# Patient Record
Sex: Female | Born: 1950 | Race: White | Hispanic: No | Smoking: Former smoker
Health system: Southern US, Community
[De-identification: ages and names within clinical notes are randomized; demographics above are authoritative.]

## PROBLEM LIST (undated history)

## (undated) DIAGNOSIS — R7881 Bacteremia: Secondary | ICD-10-CM

## (undated) DIAGNOSIS — E039 Hypothyroidism, unspecified: Secondary | ICD-10-CM

## (undated) DIAGNOSIS — I219 Acute myocardial infarction, unspecified: Secondary | ICD-10-CM

## (undated) DIAGNOSIS — E785 Hyperlipidemia, unspecified: Secondary | ICD-10-CM

## (undated) DIAGNOSIS — F329 Major depressive disorder, single episode, unspecified: Secondary | ICD-10-CM

## (undated) DIAGNOSIS — F32A Depression, unspecified: Secondary | ICD-10-CM

## (undated) DIAGNOSIS — K5790 Diverticulosis of intestine, part unspecified, without perforation or abscess without bleeding: Secondary | ICD-10-CM

## (undated) DIAGNOSIS — E119 Type 2 diabetes mellitus without complications: Secondary | ICD-10-CM

## (undated) DIAGNOSIS — C642 Malignant neoplasm of left kidney, except renal pelvis: Secondary | ICD-10-CM

## (undated) DIAGNOSIS — I10 Essential (primary) hypertension: Secondary | ICD-10-CM

## (undated) DIAGNOSIS — C689 Malignant neoplasm of urinary organ, unspecified: Secondary | ICD-10-CM

## (undated) DIAGNOSIS — I739 Peripheral vascular disease, unspecified: Secondary | ICD-10-CM

## (undated) DIAGNOSIS — F419 Anxiety disorder, unspecified: Secondary | ICD-10-CM

## (undated) DIAGNOSIS — I779 Disorder of arteries and arterioles, unspecified: Secondary | ICD-10-CM

## (undated) DIAGNOSIS — I25810 Atherosclerosis of coronary artery bypass graft(s) without angina pectoris: Secondary | ICD-10-CM

## (undated) DIAGNOSIS — M1990 Unspecified osteoarthritis, unspecified site: Secondary | ICD-10-CM

## (undated) DIAGNOSIS — E669 Obesity, unspecified: Secondary | ICD-10-CM

## (undated) DIAGNOSIS — D369 Benign neoplasm, unspecified site: Secondary | ICD-10-CM

## (undated) DIAGNOSIS — K219 Gastro-esophageal reflux disease without esophagitis: Secondary | ICD-10-CM

## (undated) HISTORY — DX: Essential (primary) hypertension: I10

## (undated) HISTORY — DX: Diverticulosis of intestine, part unspecified, without perforation or abscess without bleeding: K57.90

## (undated) HISTORY — DX: Depression, unspecified: F32.A

## (undated) HISTORY — DX: Type 2 diabetes mellitus without complications: E11.9

## (undated) HISTORY — PX: RENAL MASS EXCISION: SHX2324

## (undated) HISTORY — DX: Peripheral vascular disease, unspecified: I73.9

## (undated) HISTORY — DX: Bacteremia: R78.81

## (undated) HISTORY — PX: CHOLECYSTECTOMY: SHX55

## (undated) HISTORY — DX: Hyperlipidemia, unspecified: E78.5

## (undated) HISTORY — DX: Anxiety disorder, unspecified: F41.9

## (undated) HISTORY — DX: Malignant neoplasm of urinary organ, unspecified: C68.9

## (undated) HISTORY — DX: Disorder of arteries and arterioles, unspecified: I77.9

## (undated) HISTORY — DX: Benign neoplasm, unspecified site: D36.9

## (undated) HISTORY — DX: Hypothyroidism, unspecified: E03.9

## (undated) HISTORY — DX: Gastro-esophageal reflux disease without esophagitis: K21.9

## (undated) HISTORY — DX: Malignant neoplasm of left kidney, except renal pelvis: C64.2

## (undated) HISTORY — DX: Obesity, unspecified: E66.9

## (undated) HISTORY — DX: Unspecified osteoarthritis, unspecified site: M19.90

## (undated) HISTORY — DX: Major depressive disorder, single episode, unspecified: F32.9

## (undated) HISTORY — PX: APPENDECTOMY: SHX54

---

## 1975-06-26 HISTORY — PX: BLADDER REPAIR: SHX76

## 1975-06-26 HISTORY — PX: ABDOMINAL HYSTERECTOMY: SHX81

## 1997-06-25 DIAGNOSIS — D369 Benign neoplasm, unspecified site: Secondary | ICD-10-CM

## 1997-06-25 HISTORY — DX: Benign neoplasm, unspecified site: D36.9

## 1997-06-25 HISTORY — PX: CERVICAL SPINE SURGERY: SHX589

## 1997-12-14 ENCOUNTER — Ambulatory Visit (HOSPITAL_COMMUNITY): Admission: RE | Admit: 1997-12-14 | Discharge: 1997-12-14 | Payer: Self-pay | Admitting: Internal Medicine

## 1998-02-25 ENCOUNTER — Ambulatory Visit (HOSPITAL_COMMUNITY): Admission: RE | Admit: 1998-02-25 | Discharge: 1998-02-25 | Payer: Self-pay | Admitting: Gastroenterology

## 1998-12-24 HISTORY — PX: CARDIOVASCULAR STRESS TEST: SHX262

## 1999-06-26 HISTORY — PX: KNEE ARTHROSCOPY: SUR90

## 2000-06-25 HISTORY — PX: CT ABD W & PELVIS WO CM: HXRAD294

## 2000-07-26 LAB — HM DIABETES EYE EXAM

## 2001-04-17 ENCOUNTER — Encounter: Payer: Self-pay | Admitting: Internal Medicine

## 2001-05-02 ENCOUNTER — Ambulatory Visit (HOSPITAL_COMMUNITY): Admission: RE | Admit: 2001-05-02 | Discharge: 2001-05-02 | Payer: Self-pay | Admitting: Gastroenterology

## 2001-05-02 ENCOUNTER — Encounter (INDEPENDENT_AMBULATORY_CARE_PROVIDER_SITE_OTHER): Payer: Self-pay | Admitting: Specialist

## 2001-08-18 ENCOUNTER — Encounter: Payer: Self-pay | Admitting: Internal Medicine

## 2001-08-23 HISTORY — PX: HEEL SPUR SURGERY: SHX665

## 2002-08-24 HISTORY — PX: TRANSTHORACIC ECHOCARDIOGRAM: SHX275

## 2004-09-21 ENCOUNTER — Ambulatory Visit: Payer: Self-pay | Admitting: Internal Medicine

## 2004-10-17 ENCOUNTER — Ambulatory Visit: Payer: Self-pay | Admitting: Internal Medicine

## 2005-07-30 ENCOUNTER — Ambulatory Visit: Payer: Self-pay | Admitting: Internal Medicine

## 2005-09-05 ENCOUNTER — Ambulatory Visit: Payer: Self-pay | Admitting: Internal Medicine

## 2005-10-04 ENCOUNTER — Ambulatory Visit: Payer: Self-pay | Admitting: Internal Medicine

## 2006-01-04 ENCOUNTER — Ambulatory Visit: Payer: Self-pay | Admitting: Internal Medicine

## 2006-05-24 ENCOUNTER — Ambulatory Visit: Payer: Self-pay | Admitting: Internal Medicine

## 2006-07-05 ENCOUNTER — Ambulatory Visit: Payer: Self-pay | Admitting: Internal Medicine

## 2006-07-05 LAB — CONVERTED CEMR LAB: Hgb A1c MFr Bld: 8.4 % — ABNORMAL HIGH (ref 4.6–6.1)

## 2006-08-22 ENCOUNTER — Ambulatory Visit: Payer: Self-pay | Admitting: Internal Medicine

## 2006-10-25 ENCOUNTER — Telehealth (INDEPENDENT_AMBULATORY_CARE_PROVIDER_SITE_OTHER): Payer: Self-pay | Admitting: *Deleted

## 2006-10-31 ENCOUNTER — Encounter: Payer: Self-pay | Admitting: Internal Medicine

## 2006-10-31 DIAGNOSIS — M159 Polyosteoarthritis, unspecified: Secondary | ICD-10-CM | POA: Insufficient documentation

## 2006-10-31 DIAGNOSIS — E039 Hypothyroidism, unspecified: Secondary | ICD-10-CM

## 2006-10-31 DIAGNOSIS — E669 Obesity, unspecified: Secondary | ICD-10-CM

## 2006-10-31 DIAGNOSIS — K219 Gastro-esophageal reflux disease without esophagitis: Secondary | ICD-10-CM

## 2006-10-31 DIAGNOSIS — F39 Unspecified mood [affective] disorder: Secondary | ICD-10-CM

## 2006-10-31 DIAGNOSIS — I10 Essential (primary) hypertension: Secondary | ICD-10-CM

## 2006-11-01 DIAGNOSIS — E119 Type 2 diabetes mellitus without complications: Secondary | ICD-10-CM | POA: Insufficient documentation

## 2006-11-01 DIAGNOSIS — F411 Generalized anxiety disorder: Secondary | ICD-10-CM

## 2006-11-08 ENCOUNTER — Ambulatory Visit: Payer: Self-pay | Admitting: Internal Medicine

## 2006-11-11 LAB — CONVERTED CEMR LAB
BUN: 16 mg/dL (ref 6–23)
Chloride: 105 meq/L (ref 96–112)
Cholesterol: 136 mg/dL (ref 0–200)
Free T4: 1.36 ng/dL (ref 0.89–1.80)
Glucose, Bld: 61 mg/dL — ABNORMAL LOW (ref 70–99)
Hgb A1c MFr Bld: 8.1 % — ABNORMAL HIGH (ref 4.6–6.1)
Microalb Creat Ratio: 7 mg/g (ref 0.0–30.0)
Microalb, Ur: 1.2 mg/dL (ref 0.00–1.89)
Phosphorus: 5.3 mg/dL — ABNORMAL HIGH (ref 2.3–4.6)
Potassium: 4.2 meq/L (ref 3.5–5.3)
TSH: 1.35 microintl units/mL (ref 0.350–5.50)

## 2007-02-06 ENCOUNTER — Telehealth (INDEPENDENT_AMBULATORY_CARE_PROVIDER_SITE_OTHER): Payer: Self-pay | Admitting: *Deleted

## 2007-03-07 ENCOUNTER — Telehealth (INDEPENDENT_AMBULATORY_CARE_PROVIDER_SITE_OTHER): Payer: Self-pay | Admitting: *Deleted

## 2007-04-14 ENCOUNTER — Telehealth: Payer: Self-pay | Admitting: Internal Medicine

## 2007-07-04 ENCOUNTER — Encounter (INDEPENDENT_AMBULATORY_CARE_PROVIDER_SITE_OTHER): Payer: Self-pay | Admitting: *Deleted

## 2007-07-11 ENCOUNTER — Ambulatory Visit: Payer: Self-pay | Admitting: Internal Medicine

## 2007-07-17 ENCOUNTER — Telehealth (INDEPENDENT_AMBULATORY_CARE_PROVIDER_SITE_OTHER): Payer: Self-pay | Admitting: *Deleted

## 2007-07-31 ENCOUNTER — Ambulatory Visit: Payer: Self-pay | Admitting: Internal Medicine

## 2007-08-01 ENCOUNTER — Encounter: Payer: Self-pay | Admitting: Internal Medicine

## 2007-08-08 ENCOUNTER — Ambulatory Visit: Payer: Self-pay | Admitting: Internal Medicine

## 2007-08-21 ENCOUNTER — Telehealth: Payer: Self-pay | Admitting: Internal Medicine

## 2007-08-22 ENCOUNTER — Encounter: Payer: Self-pay | Admitting: Internal Medicine

## 2007-08-28 LAB — HM MAMMOGRAPHY: HM Mammogram: NORMAL

## 2007-09-02 ENCOUNTER — Encounter (INDEPENDENT_AMBULATORY_CARE_PROVIDER_SITE_OTHER): Payer: Self-pay | Admitting: *Deleted

## 2007-09-15 ENCOUNTER — Telehealth (INDEPENDENT_AMBULATORY_CARE_PROVIDER_SITE_OTHER): Payer: Self-pay | Admitting: *Deleted

## 2007-09-16 ENCOUNTER — Telehealth (INDEPENDENT_AMBULATORY_CARE_PROVIDER_SITE_OTHER): Payer: Self-pay | Admitting: *Deleted

## 2007-10-16 ENCOUNTER — Telehealth (INDEPENDENT_AMBULATORY_CARE_PROVIDER_SITE_OTHER): Payer: Self-pay | Admitting: *Deleted

## 2007-10-24 ENCOUNTER — Telehealth (INDEPENDENT_AMBULATORY_CARE_PROVIDER_SITE_OTHER): Payer: Self-pay | Admitting: *Deleted

## 2007-11-06 ENCOUNTER — Telehealth: Payer: Self-pay | Admitting: Internal Medicine

## 2007-11-06 ENCOUNTER — Encounter (INDEPENDENT_AMBULATORY_CARE_PROVIDER_SITE_OTHER): Payer: Self-pay | Admitting: *Deleted

## 2007-11-06 ENCOUNTER — Ambulatory Visit: Payer: Self-pay | Admitting: Internal Medicine

## 2007-11-10 ENCOUNTER — Encounter: Payer: Self-pay | Admitting: Internal Medicine

## 2007-12-04 ENCOUNTER — Ambulatory Visit: Payer: Self-pay | Admitting: Cardiology

## 2007-12-12 ENCOUNTER — Ambulatory Visit: Payer: Self-pay | Admitting: Internal Medicine

## 2007-12-15 LAB — CONVERTED CEMR LAB: Hgb A1c MFr Bld: 8.5 % — ABNORMAL HIGH (ref 4.6–6.1)

## 2007-12-23 ENCOUNTER — Ambulatory Visit: Payer: Self-pay

## 2007-12-23 ENCOUNTER — Encounter: Payer: Self-pay | Admitting: Internal Medicine

## 2007-12-23 ENCOUNTER — Encounter: Payer: Self-pay | Admitting: Cardiology

## 2007-12-25 ENCOUNTER — Ambulatory Visit: Payer: Self-pay

## 2007-12-30 ENCOUNTER — Ambulatory Visit: Payer: Self-pay | Admitting: Cardiology

## 2008-01-02 ENCOUNTER — Telehealth (INDEPENDENT_AMBULATORY_CARE_PROVIDER_SITE_OTHER): Payer: Self-pay | Admitting: *Deleted

## 2008-01-06 ENCOUNTER — Encounter: Payer: Self-pay | Admitting: Internal Medicine

## 2008-01-07 ENCOUNTER — Encounter (INDEPENDENT_AMBULATORY_CARE_PROVIDER_SITE_OTHER): Payer: Self-pay | Admitting: *Deleted

## 2008-03-15 ENCOUNTER — Ambulatory Visit: Payer: Self-pay | Admitting: Internal Medicine

## 2008-03-17 ENCOUNTER — Encounter (INDEPENDENT_AMBULATORY_CARE_PROVIDER_SITE_OTHER): Payer: Self-pay | Admitting: *Deleted

## 2008-03-17 ENCOUNTER — Telehealth (INDEPENDENT_AMBULATORY_CARE_PROVIDER_SITE_OTHER): Payer: Self-pay | Admitting: *Deleted

## 2008-03-17 LAB — CONVERTED CEMR LAB
AST: 44 units/L — ABNORMAL HIGH (ref 0–37)
Albumin: 4.3 g/dL (ref 3.5–5.2)
Alkaline Phosphatase: 85 units/L (ref 39–117)
BUN: 13 mg/dL (ref 6–23)
HDL: 35 mg/dL — ABNORMAL LOW (ref >39)
Hgb A1c MFr Bld: 8.2 % — ABNORMAL HIGH (ref 4.6–6.1)
LDL Cholesterol: 60 mg/dL (ref 0–99)
Potassium: 4.2 meq/L (ref 3.5–5.3)
Sodium: 140 meq/L (ref 135–145)
Total Protein: 7.4 g/dL (ref 6.0–8.3)

## 2008-03-18 ENCOUNTER — Emergency Department: Payer: Self-pay | Admitting: Emergency Medicine

## 2008-03-19 ENCOUNTER — Ambulatory Visit: Payer: Self-pay | Admitting: Internal Medicine

## 2008-03-19 ENCOUNTER — Telehealth (INDEPENDENT_AMBULATORY_CARE_PROVIDER_SITE_OTHER): Payer: Self-pay | Admitting: *Deleted

## 2008-03-22 ENCOUNTER — Telehealth: Payer: Self-pay | Admitting: Internal Medicine

## 2008-05-06 ENCOUNTER — Telehealth: Payer: Self-pay | Admitting: Internal Medicine

## 2008-05-28 ENCOUNTER — Ambulatory Visit: Payer: Self-pay | Admitting: Internal Medicine

## 2008-07-07 ENCOUNTER — Ambulatory Visit: Payer: Self-pay | Admitting: Family Medicine

## 2008-07-09 ENCOUNTER — Encounter (INDEPENDENT_AMBULATORY_CARE_PROVIDER_SITE_OTHER): Payer: Self-pay | Admitting: *Deleted

## 2008-07-09 ENCOUNTER — Ambulatory Visit: Payer: Self-pay | Admitting: Family Medicine

## 2008-07-13 ENCOUNTER — Ambulatory Visit: Payer: Self-pay | Admitting: Family Medicine

## 2008-07-14 ENCOUNTER — Ambulatory Visit: Payer: Self-pay | Admitting: Family Medicine

## 2008-07-15 ENCOUNTER — Ambulatory Visit: Payer: Self-pay | Admitting: Family Medicine

## 2008-07-16 ENCOUNTER — Encounter (INDEPENDENT_AMBULATORY_CARE_PROVIDER_SITE_OTHER): Payer: Self-pay | Admitting: *Deleted

## 2008-07-16 LAB — CONVERTED CEMR LAB
CO2: 29 meq/L (ref 19–32)
Calcium: 8.9 mg/dL (ref 8.4–10.5)
Chloride: 104 meq/L (ref 96–112)
Glucose, Bld: 177 mg/dL — ABNORMAL HIGH (ref 70–99)
Hemoglobin: 13.1 g/dL (ref 12.0–15.0)
Lymphocytes Relative: 26.3 % (ref 12.0–46.0)
Monocytes Relative: 4.2 % (ref 3.0–12.0)
Neutro Abs: 4.6 10*3/uL (ref 1.4–7.7)
Potassium: 5.2 meq/L — ABNORMAL HIGH (ref 3.5–5.1)
RDW: 14.5 % (ref 11.5–14.6)
Sodium: 141 meq/L (ref 135–145)

## 2008-08-30 ENCOUNTER — Telehealth: Payer: Self-pay | Admitting: Internal Medicine

## 2008-09-16 ENCOUNTER — Ambulatory Visit: Payer: Self-pay | Admitting: Internal Medicine

## 2008-09-17 ENCOUNTER — Telehealth: Payer: Self-pay | Admitting: Internal Medicine

## 2008-09-17 LAB — CONVERTED CEMR LAB
Calcium: 8.9 mg/dL (ref 8.4–10.5)
Creatinine,U: 236.3 mg/dL
GFR calc non Af Amer: 90.87 mL/min (ref >60)
Hgb A1c MFr Bld: 7.5 % — ABNORMAL HIGH (ref 4.6–6.5)
Microalb Creat Ratio: 80 mg/g — ABNORMAL HIGH (ref 0.0–30.0)
Microalb, Ur: 18.9 mg/dL — ABNORMAL HIGH (ref 0.0–1.9)
Sodium: 146 meq/L — ABNORMAL HIGH (ref 135–145)

## 2008-10-13 ENCOUNTER — Ambulatory Visit: Payer: Self-pay | Admitting: Family Medicine

## 2008-10-18 ENCOUNTER — Encounter: Payer: Self-pay | Admitting: Internal Medicine

## 2008-10-28 ENCOUNTER — Telehealth: Payer: Self-pay | Admitting: Internal Medicine

## 2008-11-08 ENCOUNTER — Ambulatory Visit: Payer: Self-pay | Admitting: Internal Medicine

## 2008-11-26 ENCOUNTER — Telehealth: Payer: Self-pay | Admitting: Internal Medicine

## 2008-12-09 ENCOUNTER — Telehealth: Payer: Self-pay | Admitting: Internal Medicine

## 2008-12-13 ENCOUNTER — Telehealth: Payer: Self-pay | Admitting: Internal Medicine

## 2008-12-13 ENCOUNTER — Ambulatory Visit: Payer: Self-pay | Admitting: Internal Medicine

## 2008-12-14 ENCOUNTER — Telehealth: Payer: Self-pay | Admitting: Internal Medicine

## 2008-12-17 ENCOUNTER — Ambulatory Visit: Payer: Self-pay | Admitting: Internal Medicine

## 2009-01-05 ENCOUNTER — Telehealth: Payer: Self-pay | Admitting: Internal Medicine

## 2009-01-07 ENCOUNTER — Telehealth: Payer: Self-pay | Admitting: Internal Medicine

## 2009-01-07 ENCOUNTER — Ambulatory Visit: Payer: Self-pay | Admitting: Internal Medicine

## 2009-01-10 ENCOUNTER — Telehealth: Payer: Self-pay | Admitting: Internal Medicine

## 2009-01-11 LAB — CONVERTED CEMR LAB
Calcium: 8.7 mg/dL (ref 8.4–10.5)
Chloride: 100 meq/L (ref 96–112)
Phosphorus: 4.3 mg/dL (ref 2.3–4.6)
Potassium: 3.9 meq/L (ref 3.5–5.3)
Sodium: 140 meq/L (ref 135–145)

## 2009-01-12 ENCOUNTER — Ambulatory Visit: Payer: Self-pay | Admitting: Family Medicine

## 2009-01-27 ENCOUNTER — Telehealth: Payer: Self-pay | Admitting: Internal Medicine

## 2009-02-02 ENCOUNTER — Telehealth: Payer: Self-pay | Admitting: Internal Medicine

## 2009-03-11 ENCOUNTER — Ambulatory Visit: Payer: Self-pay | Admitting: Internal Medicine

## 2009-03-15 LAB — CONVERTED CEMR LAB
AST: 42 units/L — ABNORMAL HIGH (ref 0–37)
Albumin: 4.1 g/dL (ref 3.5–5.2)
Alkaline Phosphatase: 109 units/L (ref 39–117)
Basophils Absolute: 0.1 10*3/uL (ref 0.0–0.1)
Eosinophils Absolute: 0.2 10*3/uL (ref 0.0–0.7)
HDL: 41 mg/dL (ref >39)
Hgb A1c MFr Bld: 8 % — ABNORMAL HIGH (ref 4.6–6.1)
Indirect Bilirubin: 0.3 mg/dL (ref 0.0–0.9)
Lymphocytes Relative: 23 % (ref 12–46)
Lymphs Abs: 1.9 10*3/uL (ref 0.7–4.0)
MCV: 87.1 fL (ref 78.0–100.0)
Microalb Creat Ratio: 36.4 mg/g — ABNORMAL HIGH (ref 0.0–30.0)
Microalb, Ur: 4.33 mg/dL — ABNORMAL HIGH (ref 0.00–1.89)
Neutrophils Relative %: 69 % (ref 43–77)
Platelets: 370 10*3/uL (ref 150–400)
RDW: 16.2 % — ABNORMAL HIGH (ref 11.5–15.5)
Total Bilirubin: 0.4 mg/dL (ref 0.3–1.2)
WBC: 8.3 10*3/uL (ref 4.0–10.5)

## 2009-03-18 ENCOUNTER — Telehealth (INDEPENDENT_AMBULATORY_CARE_PROVIDER_SITE_OTHER): Payer: Self-pay | Admitting: *Deleted

## 2009-03-21 ENCOUNTER — Telehealth: Payer: Self-pay | Admitting: Internal Medicine

## 2009-03-22 ENCOUNTER — Ambulatory Visit: Payer: Self-pay

## 2009-03-22 ENCOUNTER — Encounter: Payer: Self-pay | Admitting: Internal Medicine

## 2009-03-25 ENCOUNTER — Encounter: Payer: Self-pay | Admitting: Internal Medicine

## 2009-04-12 ENCOUNTER — Telehealth: Payer: Self-pay | Admitting: Internal Medicine

## 2009-05-12 ENCOUNTER — Telehealth: Payer: Self-pay | Admitting: Internal Medicine

## 2009-06-01 ENCOUNTER — Ambulatory Visit: Payer: Self-pay | Admitting: Family Medicine

## 2009-06-01 LAB — CONVERTED CEMR LAB
Bilirubin Urine: NEGATIVE
Blood in Urine, dipstick: NEGATIVE
Glucose, Urine, Semiquant: NEGATIVE
Ketones, urine, test strip: NEGATIVE
Protein, U semiquant: NEGATIVE
WBC Urine, dipstick: NEGATIVE

## 2009-06-02 ENCOUNTER — Encounter: Payer: Self-pay | Admitting: Family Medicine

## 2009-06-16 ENCOUNTER — Encounter: Payer: Self-pay | Admitting: Internal Medicine

## 2009-07-19 ENCOUNTER — Ambulatory Visit: Payer: Self-pay | Admitting: General Surgery

## 2009-07-22 ENCOUNTER — Encounter: Payer: Self-pay | Admitting: Internal Medicine

## 2009-07-25 ENCOUNTER — Telehealth: Payer: Self-pay | Admitting: Internal Medicine

## 2009-07-25 DIAGNOSIS — I6529 Occlusion and stenosis of unspecified carotid artery: Secondary | ICD-10-CM

## 2009-07-26 HISTORY — PX: CAROTID ENDARTERECTOMY: SUR193

## 2009-07-28 ENCOUNTER — Telehealth (INDEPENDENT_AMBULATORY_CARE_PROVIDER_SITE_OTHER): Payer: Self-pay | Admitting: *Deleted

## 2009-08-01 ENCOUNTER — Ambulatory Visit: Payer: Self-pay | Admitting: Cardiovascular Disease

## 2009-08-01 ENCOUNTER — Encounter (HOSPITAL_COMMUNITY): Admission: RE | Admit: 2009-08-01 | Discharge: 2009-09-28 | Payer: Self-pay | Admitting: Internal Medicine

## 2009-08-01 ENCOUNTER — Ambulatory Visit: Payer: Self-pay

## 2009-08-03 ENCOUNTER — Ambulatory Visit: Payer: Self-pay

## 2009-08-04 ENCOUNTER — Telehealth: Payer: Self-pay | Admitting: Cardiovascular Disease

## 2009-08-05 ENCOUNTER — Encounter: Payer: Self-pay | Admitting: Internal Medicine

## 2009-08-10 ENCOUNTER — Telehealth: Payer: Self-pay | Admitting: Internal Medicine

## 2009-08-12 ENCOUNTER — Ambulatory Visit: Payer: Self-pay | Admitting: General Surgery

## 2009-08-17 ENCOUNTER — Inpatient Hospital Stay: Payer: Self-pay | Admitting: General Surgery

## 2009-09-23 ENCOUNTER — Ambulatory Visit: Payer: Self-pay | Admitting: Internal Medicine

## 2009-09-27 LAB — CONVERTED CEMR LAB
ALT: 73 units/L — ABNORMAL HIGH (ref 0–35)
AST: 76 units/L — ABNORMAL HIGH (ref 0–37)
Albumin: 3.9 g/dL (ref 3.5–5.2)
CO2: 24 meq/L (ref 19–32)
Calcium: 9 mg/dL (ref 8.4–10.5)
Chloride: 102 meq/L (ref 96–112)
Hgb A1c MFr Bld: 8.7 % — ABNORMAL HIGH (ref 4.6–6.1)
Potassium: 4.1 meq/L (ref 3.5–5.3)
Sodium: 139 meq/L (ref 135–145)
Total Protein: 7 g/dL (ref 6.0–8.3)

## 2009-09-30 ENCOUNTER — Encounter (INDEPENDENT_AMBULATORY_CARE_PROVIDER_SITE_OTHER): Payer: Self-pay | Admitting: *Deleted

## 2009-10-05 ENCOUNTER — Telehealth: Payer: Self-pay | Admitting: Internal Medicine

## 2009-11-22 ENCOUNTER — Encounter: Payer: Self-pay | Admitting: Internal Medicine

## 2009-12-02 ENCOUNTER — Ambulatory Visit: Payer: Self-pay | Admitting: Internal Medicine

## 2010-01-05 ENCOUNTER — Telehealth (INDEPENDENT_AMBULATORY_CARE_PROVIDER_SITE_OTHER): Payer: Self-pay | Admitting: *Deleted

## 2010-01-06 ENCOUNTER — Ambulatory Visit: Payer: Self-pay | Admitting: Family Medicine

## 2010-01-06 DIAGNOSIS — T148 Other injury of unspecified body region: Secondary | ICD-10-CM

## 2010-01-06 DIAGNOSIS — W57XXXA Bitten or stung by nonvenomous insect and other nonvenomous arthropods, initial encounter: Secondary | ICD-10-CM | POA: Insufficient documentation

## 2010-01-23 ENCOUNTER — Telehealth: Payer: Self-pay | Admitting: Family Medicine

## 2010-03-03 ENCOUNTER — Ambulatory Visit: Payer: Self-pay | Admitting: Internal Medicine

## 2010-03-05 LAB — CONVERTED CEMR LAB
ALT: 22 units/L (ref 0–35)
AST: 17 units/L (ref 0–37)
BUN: 22 mg/dL (ref 6–23)
Cholesterol: 145 mg/dL (ref 0–200)
Creatinine, Ser: 0.78 mg/dL (ref 0.40–1.20)
HDL: 42 mg/dL (ref >39)
Hgb A1c MFr Bld: 6 % — ABNORMAL HIGH (ref <5.7)
Total Bilirubin: 0.3 mg/dL (ref 0.3–1.2)
Total CHOL/HDL Ratio: 3.5
VLDL: 19 mg/dL (ref 0–40)

## 2010-04-05 ENCOUNTER — Telehealth: Payer: Self-pay | Admitting: Internal Medicine

## 2010-04-14 ENCOUNTER — Telehealth (INDEPENDENT_AMBULATORY_CARE_PROVIDER_SITE_OTHER): Payer: Self-pay | Admitting: *Deleted

## 2010-04-18 ENCOUNTER — Ambulatory Visit: Payer: Self-pay | Admitting: Internal Medicine

## 2010-04-24 ENCOUNTER — Telehealth: Payer: Self-pay | Admitting: Internal Medicine

## 2010-04-26 ENCOUNTER — Telehealth: Payer: Self-pay | Admitting: Internal Medicine

## 2010-05-05 ENCOUNTER — Telehealth: Payer: Self-pay | Admitting: Internal Medicine

## 2010-05-05 ENCOUNTER — Encounter: Payer: Self-pay | Admitting: Internal Medicine

## 2010-05-09 ENCOUNTER — Telehealth: Payer: Self-pay | Admitting: Internal Medicine

## 2010-05-12 ENCOUNTER — Encounter: Payer: Self-pay | Admitting: Internal Medicine

## 2010-05-26 ENCOUNTER — Ambulatory Visit: Payer: Self-pay | Admitting: Internal Medicine

## 2010-05-29 ENCOUNTER — Telehealth: Payer: Self-pay | Admitting: Internal Medicine

## 2010-05-29 LAB — CONVERTED CEMR LAB
AST: 18 units/L (ref 0–37)
Albumin: 4.4 g/dL (ref 3.5–5.2)
Basophils Absolute: 0.1 10*3/uL (ref 0.0–0.1)
Bilirubin, Direct: <0.1 mg/dL (ref 0.0–0.3)
Eosinophils Absolute: 0.3 10*3/uL (ref 0.0–0.7)
Eosinophils Relative: 3 % (ref 0–5)
HCT: 42.5 % (ref 36.0–46.0)
Lymphs Abs: 3 10*3/uL (ref 0.7–4.0)
MCV: 84.5 fL (ref 78.0–100.0)
Neutrophils Relative %: 52 % (ref 43–77)
Platelets: 292 10*3/uL (ref 150–400)
RDW: 16.5 % — ABNORMAL HIGH (ref 11.5–15.5)
Total Bilirubin: 0.2 mg/dL — ABNORMAL LOW (ref 0.3–1.2)
Valproic Acid Lvl: 47.1 ug/mL — ABNORMAL LOW (ref 50.0–100.0)
WBC: 8.6 10*3/uL (ref 4.0–10.5)

## 2010-06-09 ENCOUNTER — Telehealth: Payer: Self-pay | Admitting: Internal Medicine

## 2010-07-11 ENCOUNTER — Telehealth: Payer: Self-pay | Admitting: Internal Medicine

## 2010-07-23 LAB — CONVERTED CEMR LAB
ALT: 80 U/L — ABNORMAL HIGH
AST: 97 U/L — ABNORMAL HIGH
Albumin: 3.5 g/dL
Alkaline Phosphatase: 86 U/L
BUN: 15 mg/dL
Basophils Absolute: 0 K/uL
Basophils Relative: 0.1 %
Bilirubin, Direct: 0.1 mg/dL
CO2: 34 meq/L — ABNORMAL HIGH
Calcium: 8.9 mg/dL
Chloride: 101 meq/L
Cholesterol: 172 mg/dL
Creatinine, Ser: 0.7 mg/dL
Creatinine,U: 56.1 mg/dL
Eosinophils Absolute: 0.3 K/uL
Eosinophils Relative: 2.3 %
GFR calc Af Amer: 111 mL/min
GFR calc non Af Amer: 91 mL/min
Glucose, Bld: 196 mg/dL — ABNORMAL HIGH
HCT: 42.4 %
HDL: 49.9 mg/dL
Hemoglobin: 13.9 g/dL
Hgb A1c MFr Bld: 9.3 % — ABNORMAL HIGH
LDL Cholesterol: 95 mg/dL
Lymphocytes Relative: 25.7 %
MCHC: 32.7 g/dL
MCV: 84.9 fL
Microalb Creat Ratio: 37.4 mg/g — ABNORMAL HIGH
Microalb, Ur: 2.1 mg/dL — ABNORMAL HIGH
Monocytes Absolute: 0.5 K/uL
Monocytes Relative: 4.8 %
Neutro Abs: 7.4 K/uL
Neutrophils Relative %: 67.1 %
Phosphorus: 5.1 mg/dL — ABNORMAL HIGH
Platelets: 340 K/uL
Potassium: 4.2 meq/L
RBC: 5 M/uL
RDW: 15.4 % — ABNORMAL HIGH
Sodium: 142 meq/L
TSH: 2.83 u[IU]/mL
Total Bilirubin: 0.7 mg/dL
Total CHOL/HDL Ratio: 3.4
Total Protein: 7 g/dL
Triglycerides: 138 mg/dL
VLDL: 28 mg/dL
WBC: 11.1 10*3/microliter — ABNORMAL HIGH

## 2010-07-24 ENCOUNTER — Telehealth: Payer: Self-pay | Admitting: Internal Medicine

## 2010-07-25 NOTE — Progress Notes (Signed)
Summary: Nuclear Pre-Procedure  Phone Note Outgoing Call Call back at [facility] Phone [phone]   Call placed by: Stanton Kidney, EMT-P,  July 28, 2009 2:03 PM Action Taken: Phone Call Completed Summary of Call: Left message with information on My[employer] Information Sheet (see scanned document for details).     Nuclear Med Background Indications for Stress Test: Evaluation for Ischemia, Surgical Clearance  Indications Comments: Pending (R) CEA by Dr. Virgia Land ([place])  History: Echo, Heart Catheterization, Myocardial Perfusion Study      Nuclear Pre-Procedure Cardiac Risk Factors: Carotid Disease, Hypertension, IDDM Type 2, Lipids Height (in): 61

## 2010-07-25 NOTE — Letter (Signed)
Summary: [facility]  [facility]   Imported By: Lanelle Bal 12/01/2009 09:24:31  _____________________________________________________________________  External Attachment:    Type:   Image     Comment:   External Document  Appended Document:  [facility] F/U of right CEA dong well 1 year follow up

## 2010-07-25 NOTE — Progress Notes (Signed)
Summary: citalopram   Phone Note Refill Request Message from:  Fax from Pharmacy on May 09, 2010 2:42 PM  Refills Requested: Medication #1:  CITALOPRAM HYDROBROMIDE 40 MG TABS take 1 tablet by mouth once daily   Last Refilled: 10/31/2009 Refill request recieved from [facility] [address] [phone]. Form is on your desk.    Follow-up for Phone Call        dose has been decreased to 40mg  daily Follow-up by: [name] MD,  May 10, 2010 7:55 AM  Additional Follow-up for Phone Call Additional follow up Details #1::        Rx faxed to pharmacy for lower dose. Additional Follow-up by: Mervin Hack CMA Duncan Dull),  May 10, 2010 8:19 AM    Prescriptions: CITALOPRAM HYDROBROMIDE 40 MG TABS (CITALOPRAM HYDROBROMIDE) take 1 tablet by mouth once daily  #90 x 3   Entered by:   Mervin Hack CMA (AAMA)   Authorized by:   [name] MD   Signed by:   Mervin Hack CMA (AAMA) on 05/10/2010   Method used:   Electronically to        [facility] [address] (retail)       [address]       [place], [place]  [address]       Ph: [phone]       Fax: [phone]   RxID:   [id number]

## 2010-07-25 NOTE — Medication Information (Signed)
Summary: Insulin change/[facility]  Insulin change/[facility]   Imported By: Lester  05/15/2010 12:30:59  _____________________________________________________________________  External Attachment:    Type:   Image     Comment:   External Document

## 2010-07-25 NOTE — Progress Notes (Signed)
Summary: flea bites  Phone Note Call from Patient Call back at Work Phone [phone]   Caller: Patient Call For: [name] MD Summary of Call: Patient's grand daughter brought over her new kitten on tuesday and Nautia says that she was eaten up by the fleas that were on the kitten. She says that the flea bites are on her face and on her body and they itch terribly. She has made several of them bleed while scratching them. She is very concerned about them becoming infected because she is a diabetic. She wants to know if she could have some cream or something called in to [facility] [address] to help clear this up and stop the itching.  Initial call taken by: Melody Comas,  January 05, 2010 2:54 PM  Follow-up for Phone Call        I really hesitate to call in steroid cream without seeing it.  Can she come in to see someone? Follow-up by: Ruthe Mannan MD,  January 05, 2010 3:02 PM  Additional Follow-up for Phone Call Additional follow up Details #1::        Appt scheduled for tomorrow.  Additional Follow-up by: Melody Comas,  January 05, 2010 4:17 PM

## 2010-07-25 NOTE — Letter (Signed)
Summary: Medical Clearance for Surgery  Medical Clearance for Surgery   Imported By: Maryln Gottron 09/12/2009 15:09:56  _____________________________________________________________________  External Attachment:    Type:   Image     Comment:   External Document

## 2010-07-25 NOTE — Assessment & Plan Note (Signed)
Summary: 30 MIN F/U PER DR LETVAK/CLE   Vital Signs:  Patient profile:   60 year old female Weight:      256 pounds Temp:     98.1 degrees F oral BP sitting:   124 / 60  (left arm) Cuff size:   large  Vitals Entered By: Mervin Hack CMA Duncan Dull) (April 18, 2010 10:01 AM) CC: follow-up visit   History of Present Illness: Still on Atkins diet has lost an additional 9# sugars doing well even off the novolog  Mood has been a problem "hateful" at times--not at work as much but at times with family "little things irritate me"  Did try lorazepam once--may have kept her from being "hateful" Is regular with bupropion  May be somewhat worse on the lower dose of the citalopram  Not sleeping well Initiates well but then up after a couple of hours. Awakens multiple times this has been going on for a long time is tired in AM and stays tired brand Remus Loffler works but not generic. Temazepam no hellp trazodone not that effective---and AM sedation if she takes 100mg   Does get depressed at times will get tearful at times (like after hearing a song on the radio that makes her think of her husband who died 10 years ago) Has been shopping a lot on [employer]--though not spending a lot of money Does have some up and down periods Daughter is bipolar No true mania  Allergies: 1)  ! Lisinopril-Hydrochlorothiazide (Lisinopril-Hydrochlorothiazide) 2)  Demerol (Meperidine Hcl) 3)  Codeine Phosphate (Codeine Phosphate) 4)  * Bextra (Valdecoxib) 5)  Effexor 6)  Cymbalta (Duloxetine Hcl)  Past History:  Past medical, surgical, family and social histories (including risk factors) reviewed for relevance to current acute and chronic problems.  Past Medical History: Reviewed history from 10/31/2006 and no changes required. Colonic polyps, hx of Depression--paranoid tendencies Diabetes mellitus, type II GERD Hyperlipidemia Hypertension Hypothyroidism Anxiety Obesity Arthritis--L knee  Past  Surgical History: Reviewed history from 09/23/2009 and no changes required. Heel spur left 03/03 Left knee arthroscoopy Katrinka Blazing) 2001 Hysterectomy 1977 ( one ovary left) bladder tacking 1977 Cervical spine repairs 1999 DEXA Normal 05/02 Abd. CT negative, Pitutary MRI negative 2002 ECHO normal03/04 Stress test negative (no cardiolite) 07/00 Right CEA  2/11    Dr Evette Cristal  Family History: Father: Died at age 40, CAD, DM Mother: Alive, breast cancer, DM, CAD Siblings 2 One R.A., one possible throat cancer, HTN  CAD- parents HTN- sister Older aughter is bipolar, younger daughter also with depression  Social History: Reviewed history from 03/15/2008 and no changes required. 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  NAD Psych:  normally interactive, good eye contact, and not depressed appearing.  Does have a slight pressured speech but not all that much   Impression & Recommendations:  Problem # 1:  DEPRESSION (ICD-311) Assessment Deteriorated had been worsening even before we had to decrease the dose of the citalopram No true mania but has irritability and fidgetiness suggestive of bipolar variant sleep has been resistant to multiple meds including temazepam, trazodone, melatonin Brand ambien worked but not generic Daytime irritability argues for generalized Rx increase--not just for sleep  P: will try valproate  for mood stabilization     stop the trazodone since not helping     lorazepam helps but want to avoid dependence     counselled more than half of 25 minute visit  The following medications were removed from the medication list:    Trazodone Hcl 100 Mg Tabs (Trazodone hcl) .Marland Kitchen... Take 1/2 tab  the first 2 nights, then 1 full tab if not tired from med in the am Her updated medication list for this problem includes:    Lorazepam 0.5 Mg Tabs (Lorazepam) .Marland Kitchen... Take 1-2  tablets  by mouth two times a day as needed nerves    Citalopram Hydrobromide 40 Mg Tabs (Citalopram hydrobromide) .Marland Kitchen... Take 1 tablet by mouth once daily    Bupropion Hcl 150 Mg Xr12h-tab (Bupropion hcl) .Marland Kitchen... Take 1 by mouth two times a day  Complete Medication List: 1)  Simvastatin 80 Mg Tabs (Simvastatin) .... Take 1/2 tab  by mouth daily 2)  Lantus 100 Unit/ml Soln (Insulin glargine) .Marland Kitchen.. 100 units daily as directed 3)  Metformin Hcl 500 Mg Tabs (Metformin hcl) .... Take one by mouth bid 4)  Levoxyl 100 Mcg Tabs (Levothyroxine sodium) .... Take one by mouth daily 5)  Vicodin 5-500 Mg Tabs (Hydrocodone-acetaminophen) .Marland Kitchen.. 1 every 4 hours as needed for severe pain 6)  Lorazepam 0.5 Mg Tabs (Lorazepam) .... Take 1-2  tablets  by mouth two times a day as needed nerves 7)  Glipizide 5 Mg Tabs (Glipizide) .... Take 1 by mouth two times a day 8)  Citalopram Hydrobromide 40 Mg Tabs (Citalopram hydrobromide) .... Take 1 tablet by mouth once daily 9)  Lisinopril-hydrochlorothiazide 20-25 Mg Tabs (Lisinopril-hydrochlorothiazide) .Marland Kitchen.. 1 daily for high blood pressure 10)  Bupropion Hcl 150 Mg Xr12h-tab (Bupropion hcl) .... Take 1 by mouth two times a day 11)  Estrace 0.1 Mg/gm Crea (Estradiol) .... Apply 1/4 tube three times a week 12)  Relion Blood Glucose Test Strp (Glucose blood) .... Patient test blood sugar three times a day 13)  Fish Oil Caps (Omega-3 fatty acids caps) .... 2 at night 14)  Aleve 220 Mg Caps (Naproxen sodium) .... 2 tablets in am by mouth 15)  Unifine Pentips 31g X 8 Mm Misc (Insulin pen needle) .... Use as directed 16)  Zyrtec Allergy 10 Mg Tabs (Cetirizine hcl) .... Take 1/2 by mouth once daily 17)  Fluocinolone Acetonide 0.025 % Crea (Fluocinolone acetonide) .... Apply to area two times a day 18)   Valproic Acid 250 Mg Caps (Valproic acid) .Marland Kitchen.. 1 tab by mouth two times a day to stabilize mood  Patient Instructions: 1)  Please start the valproic acid two times a day to see if that stabilizes your mood and cuts down on your irritability 2)  Please schedule a follow-up appointment in 1 month.  Prescriptions: VALPROIC ACID 250 MG CAPS (VALPROIC ACID) 1 tab by mouth two times a day to stabilize mood  #60 x 3   Entered and Authorized by:   [name] MD   Signed by:   [name] MD on 04/18/2010   Method used:   Electronically to        [facility] [address] (retail)       [address]       [place], [place]  [address]       Ph: [phone]       Fax: [phone]   RxID:   [id number]  Orders Added: 1)  Est. Patient Level IV [18841]    Current Allergies (reviewed today): ! LISINOPRIL-HYDROCHLOROTHIAZIDE (LISINOPRIL-HYDROCHLOROTHIAZIDE) DEMEROL (MEPERIDINE HCL) CODEINE PHOSPHATE (CODEINE PHOSPHATE) * BEXTRA (VALDECOXIB) EFFEXOR CYMBALTA (DULOXETINE HCL)

## 2010-07-25 NOTE — Assessment & Plan Note (Signed)
Summary: ONE MONTH FOLLOW UP / LFW   Vital Signs:  Patient profile:   60 year old female Height:      61 inches Weight:      252.50 pounds BMI:     47.88 Temp:     97.5 degrees F oral Pulse rate:   64 / minute Pulse rhythm:   regular BP sitting:   110 / 62  (left arm) Cuff size:   large  Vitals Entered By: Linde Gillis CMA Duncan Dull) (May 26, 2010 2:51 PM) CC: one month follow up   History of Present Illness: Sleeping much better  using the ambien most nights now--unless she forgets  Mood is much better Irritability has really calmed down  Depression is better More happy, doing better at work  Allergies: 1)  ! Lisinopril-Hydrochlorothiazide (Lisinopril-Hydrochlorothiazide) 2)  Demerol (Meperidine Hcl) 3)  Codeine Phosphate (Codeine Phosphate) 4)  * Bextra (Valdecoxib) 5)  Effexor 6)  Cymbalta (Duloxetine Hcl)  Past History:  Past medical, surgical, family and social histories (including risk factors) reviewed for relevance to current acute and chronic problems.  Past Medical History: Reviewed history from 10/31/2006 and no changes required. Colonic polyps, hx of Depression--paranoid tendencies Diabetes mellitus, type II GERD Hyperlipidemia Hypertension Hypothyroidism Anxiety Obesity Arthritis--L knee  Past Surgical History: Reviewed history from 09/23/2009 and no changes required. Heel spur left 03/03 Left knee arthroscoopy Katrinka Blazing) 2001 Hysterectomy 1977 ( one ovary left) bladder tacking 1977 Cervical spine repairs 1999 DEXA Normal 05/02 Abd. CT negative, Pitutary MRI negative 2002 ECHO normal03/04 Stress test negative (no cardiolite) 07/00 Right CEA  2/11    Dr Evette Cristal  Family History: Reviewed history from 04/18/2010 and no changes required. Father: Died at age 29, CAD, DM Mother: Alive, breast cancer, DM, CAD Siblings 2 One R.A., one possible throat cancer, HTN  CAD- parents HTN- sister Older aughter is bipolar, younger daughter also with  depression  Social History: Reviewed history from 03/15/2008 and no changes required. 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   Psych:  normally interactive, good eye contact, not anxious appearing, and not depressed appearing.     Impression & Recommendations:  Problem # 1:  DEPRESSION (ICD-311) Assessment Improved  really better now Not as irritable much more energy not clear if that is due to the depakote or sleeping better with ambien  Her updated medication list for this problem includes:    Lorazepam 0.5 Mg Tabs (Lorazepam) .Marland Kitchen... Take 1-2  tablets  by mouth two times a day as needed nerves    Citalopram Hydrobromide 40 Mg Tabs (Citalopram hydrobromide) .Marland Kitchen... Take 1 tablet by mouth once daily    Bupropion Hcl 150 Mg Xr12h-tab (Bupropion hcl) .Marland Kitchen... Take 1 by mouth two times a day  Orders: Venipuncture ([address]) T-CBC w/Diff ([id number]) T-Hepatic Function [phone]) T- * Misc. Laboratory test [phone])  Complete Medication List: 1)  Simvastatin 80 Mg Tabs (Simvastatin) .... Take 1/2 tab  by mouth daily 2)  Metformin Hcl 500 Mg Tabs (Metformin hcl) .... Take one by mouth bid 3)  Levoxyl 100 Mcg Tabs (Levothyroxine sodium) .... Take one by mouth daily 4)  Vicodin 5-500 Mg Tabs (Hydrocodone-acetaminophen) .Marland Kitchen.. 1 every 4 hours as needed for severe pain  5)  Lorazepam 0.5 Mg Tabs (Lorazepam) .... Take 1-2  tablets  by mouth two times a day as needed nerves 6)  Glipizide 5 Mg Tabs (Glipizide) .... Take 1 by mouth two times a day 7)  Citalopram Hydrobromide 40 Mg Tabs (Citalopram hydrobromide) .... Take 1 tablet by mouth once daily 8)  Lisinopril-hydrochlorothiazide 20-25 Mg Tabs  (Lisinopril-hydrochlorothiazide) .Marland Kitchen.. 1 daily for high blood pressure 9)  Bupropion Hcl 150 Mg Xr12h-tab (Bupropion hcl) .... Take 1 by mouth two times a day 10)  Estrace 0.1 Mg/gm Crea (Estradiol) .... Apply 1/4 tube three times a week 11)  Relion Blood Glucose Test Strp (Glucose blood) .... Patient test blood sugar three times a day 12)  Fish Oil Caps (Omega-3 fatty acids caps) .... 2 at night 13)  Aleve 220 Mg Caps (Naproxen sodium) .... 2 tablets in am by mouth 14)  Zyrtec Allergy 10 Mg Tabs (Cetirizine hcl) .... Take 1/2 by mouth once daily 15)  Fluocinolone Acetonide 0.025 % Crea (Fluocinolone acetonide) .... Apply to area two times a day 16)  Zolpidem Tartrate 5 Mg Tabs (Zolpidem tartrate) .Marland Kitchen.. 1-2 at bedtime as needed for sleep 17)  Levemir 100 Unit/ml Soln (Insulin detemir) .Marland Kitchen.. 100 units daily as directed 18)  Depakote 500 Mg Tbec (Divalproex sodium) .... Take 1 by mouth two times a day  Patient Instructions: 1)  Please schedule a follow-up appointment in 4 months .    Orders Added: 1)  Est. Patient Level III [44034] 2)  Venipuncture [36415] 3)  T-CBC w/Diff [[id number]] 4)  T-Hepatic Function [[id number]] 5)  T- * Misc. Laboratory test [phone]    Current Allergies (reviewed today): ! LISINOPRIL-HYDROCHLOROTHIAZIDE (LISINOPRIL-HYDROCHLOROTHIAZIDE) DEMEROL (MEPERIDINE HCL) CODEINE PHOSPHATE (CODEINE PHOSPHATE) * BEXTRA (VALDECOXIB) EFFEXOR CYMBALTA (DULOXETINE HCL)  Appended Document: ONE MONTH FOLLOW UP / LFW

## 2010-07-25 NOTE — Assessment & Plan Note (Signed)
Summary: 6 m f/u dlo R/S FROM 09/16/09   Vital Signs:  Patient profile:   60 year old female Weight:      303 pounds Temp:     98.7 degrees F oral Pulse rate:   88 / minute Pulse rhythm:   regular BP sitting:   158 / 80  (left arm) Cuff size:   large  Vitals Entered By: Mervin Hack CMA Duncan Dull) (September 23, 2009 2:37 PM) CC: 6 month follow-up   History of Present Illness: Had right CEA in February did have some post op infections but now finished with antibiotics No other problems  Noting right> left leg swelling Not being careful with diet Eats out a lot or does take in  Was checking sugars three times a day  Ran out of strips daily is probably okay Have been in the 200's No hypoglycemic reactions overdue for eye exam--will be getting soon  No chest pain No SOB  Has been down some concerned about alcoholic daughter still lives with roommate--gets frustrated with her Has been very angry lately---did feel better on the buproprion   Allergies: 1)  ! Lisinopril-Hydrochlorothiazide (Lisinopril-Hydrochlorothiazide) 2)  Demerol (Meperidine Hcl) 3)  Codeine Phosphate (Codeine Phosphate) 4)  * Bextra (Valdecoxib) 5)  Effexor 6)  Cymbalta (Duloxetine Hcl)  Past History:  Past medical, surgical, family and social histories (including risk factors) reviewed for relevance to current acute and chronic problems.  Past Medical History: Reviewed history from 10/31/2006 and no changes required. Colonic polyps, hx of Depression--paranoid tendencies Diabetes mellitus, type II GERD Hyperlipidemia Hypertension Hypothyroidism Anxiety Obesity Arthritis--L knee  Past Surgical History: Heel spur left 03/03 Left knee arthroscoopy Katrinka Blazing) 2001 Hysterectomy 1977 ( one ovary left) bladder tacking 1977 Cervical spine repairs 1999 DEXA Normal 05/02 Abd. CT negative, Pitutary MRI negative 2002 ECHO normal03/04 Stress test negative (no cardiolite) 07/00 Right CEA  2/11    Dr  Evette Cristal  Family History: Reviewed history from 10/31/2006 and no changes required. Father: Died at age 36, CAD, DM Mother: Alive, breast cancer, DM, CAD Siblings 2 One R.A., one possible throat cancer, HTN  CAD- parents HTN- sister  Social History: Reviewed history from 03/15/2008 and no changes required. 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  NAD Neck:  supple, no masses, no thyromegaly, and no cervical lymphadenopathy.   Lungs:  normal respiratory effort and normal breath sounds.   Heart:  normal rate, regular rhythm, no murmur, and no gallop.   Pulses:  1+ on right faint on left Extremities:  trace edema in left foot Skin:  no suspicious lesions and no ulcerations.   Psych:  normally interactive, good eye contact, not anxious appearing, and dysphoric affect.    Diabetes Management Exam:    Foot Exam (with socks and/or shoes not present):       Sensory-Pinprick/Light touch:          Left medial foot (L-4): normal          Left dorsal foot (L-5): normal          Left lateral foot (S-1): normal          Right medial foot (L-4):  normal          Right dorsal foot (L-5): normal          Right lateral foot (S-1): normal       Inspection:          Left foot: abnormal             Comments: dry skin          Right foot: abnormal             Comments: dry skin       Nails:          Left foot: normal          Right foot: normal   Impression & Recommendations:  Problem # 1:  DIABETES MELLITUS, TYPE II, WITH NEUROLOGICAL COMPLICATIONS (ICD-250.60) Assessment Deteriorated  has been noncompliant with diet may need to increase insulin  Her updated medication list for  this problem includes:    Lantus 100 Unit/ml Soln (Insulin glargine) .Marland KitchenMarland KitchenMarland KitchenMarland Kitchen 100 units daily as directed    Metformin Hcl 500 Mg Tabs (Metformin hcl) .Marland Kitchen... Take one by mouth bid    Novolog Flexpen 100 Unit/ml Soln (Insulin aspart) .Marland KitchenMarland KitchenMarland KitchenMarland Kitchen 35-40 units three times a day as directed , dispense with needles    Glipizide 5 Mg Tabs (Glipizide) .Marland Kitchen... Take 1 by mouth two times a day    Lisinopril-hydrochlorothiazide 20-25 Mg Tabs (Lisinopril-hydrochlorothiazide) .Marland Kitchen... 1 daily for high blood pressure  Orders: T- Hemoglobin A1C (29562-13086) T-Comprehensive Metabolic Panel (57846-96295) Venipuncture (28413)  Problem # 2:  DEPRESSION (ICD-311) Assessment: Deteriorated will restart the buproprion  Her updated medication list for this problem includes:    Lorazepam 0.5 Mg Tabs (Lorazepam) .Marland Kitchen... Take 1-2  tablets  by mouth two times a day as needed nerves    Citalopram Hydrobromide 40 Mg Tabs (Citalopram hydrobromide) .Marland Kitchen... Take 1 1/2 tablet by mouth once daily    Trazodone Hcl 100 Mg Tabs (Trazodone hcl) .Marland Kitchen... Take 1/2 tab the first 2 nights, then 1 full tab if not tired from med in the am    Bupropion Hcl 100 Mg Xr12h-tab (Bupropion hcl) .Marland Kitchen... 1 tab by mouth two times a day for depression  Problem # 3:  HYPERTENSION (ICD-401.9) Assessment: Unchanged too high if still up next time, will increase meds (has been so noncompliant)  Her updated medication list for this problem includes:    Lisinopril-hydrochlorothiazide 20-25 Mg Tabs (Lisinopril-hydrochlorothiazide) .Marland Kitchen... 1 daily for high blood pressure  BP today: 158/80 Prior BP: 146/82 (06/01/2009)  Labs Reviewed: K+: 3.9 (01/07/2009) Creat: : 0.70 (01/07/2009)   Chol: 158 (03/11/2009)   HDL: 41 (03/11/2009)   LDL: 85 (03/11/2009)   TG: 158 (03/11/2009)  Complete Medication List: 1)  Simvastatin 80 Mg Tabs (Simvastatin) .... Take one by mouth daily 2)  Lantus 100 Unit/ml Soln (Insulin glargine) .Marland Kitchen.. 100 units daily as directed 3)  Metformin Hcl  500 Mg Tabs (Metformin hcl) .... Take one by mouth bid 4)  Levoxyl 100 Mcg Tabs (Levothyroxine sodium) .... Take one by mouth daily 5)  Fish Oil Caps (Omega-3 fatty acids caps) .... 2 at night 6)  Vicodin 5-500 Mg Tabs (Hydrocodone-acetaminophen) .Marland Kitchen.. 1 every 4 hours as needed for severe pain 7)  Aleve 220 Mg Caps (Naproxen sodium) .... 2 tablets in am by mouth 8)  Unifine Pentips 31g X 8 Mm Misc (Insulin pen needle) .... Use as directed 9)  Novolog Flexpen 100 Unit/ml Soln (Insulin aspart) .... 35-40 units three times a day as directed ,  dispense with needles 10)  Onetouch Ultra Test Strp (Glucose blood) .... Check three times a day or as directed 11)  Lorazepam 0.5 Mg Tabs (Lorazepam) .... Take 1-2  tablets  by mouth two times a day as needed nerves 12)  Glipizide 5 Mg Tabs (Glipizide) .... Take 1 by mouth two times a day 13)  Citalopram Hydrobromide 40 Mg Tabs (Citalopram hydrobromide) .... Take 1 1/2 tablet by mouth once daily 14)  Estrace 0.1 Mg/gm Crea (Estradiol) .... Apply 1/4 tube three times a week 15)  Lisinopril-hydrochlorothiazide 20-25 Mg Tabs (Lisinopril-hydrochlorothiazide) .Marland Kitchen.. 1 daily for high blood pressure 16)  Trazodone Hcl 100 Mg Tabs (Trazodone hcl) .... Take 1/2 tab the first 2 nights, then 1 full tab if not tired from med in the am 17)  Zyrtec Allergy 10 Mg Tabs (Cetirizine hcl) .... Take 1/2 by mouth once daily 18)  Bupropion Hcl 100 Mg Xr12h-tab (Bupropion hcl) .Marland Kitchen.. 1 tab by mouth two times a day for depression  Patient Instructions: 1)  Please schedule a follow-up appointment in 2 months.  Prescriptions: BUPROPION HCL 100 MG XR12H-TAB (BUPROPION HCL) 1 tab by mouth two times a day for depression  #60 x 11   Entered and Authorized by:   [name] MD   Signed by:   [name] MD on 09/23/2009   Method used:   Electronically to        [facility] [address] (retail)       [address]       [place], [place]  [address]       Ph: [phone]       Fax: [phone]   RxID:   [id number]   Current Allergies (reviewed today): ! LISINOPRIL-HYDROCHLOROTHIAZIDE (LISINOPRIL-HYDROCHLOROTHIAZIDE) DEMEROL (MEPERIDINE HCL) CODEINE PHOSPHATE (CODEINE PHOSPHATE) * BEXTRA (VALDECOXIB) EFFEXOR CYMBALTA (DULOXETINE HCL)

## 2010-07-25 NOTE — Assessment & Plan Note (Signed)
Summary: flea bites   Vital Signs:  Patient profile:   60 year old female Height:      61 inches Weight:      280.13 pounds BMI:     53.12 Temp:     98.7 degrees F oral Pulse rate:   72 / minute Pulse rhythm:   regular BP sitting:   120 / 60  (right arm) Cuff size:   large  Vitals Entered By: Linde Gillis CMA Duncan Dull) (January 06, 2010 3:59 PM) CC: flea bites   History of Present Illness: 60 yo with flea bites from grand daughter's cat. On face, arms, and legs. Very itchy. Not painful. No surrounding erythema, fevers, chills, nausea or other systemic symptoms.  Current Medications (verified): 1)  Simvastatin 80 Mg Tabs (Simvastatin) .... Take 1/2 Tab  By Mouth Daily 2)  Lantus 100 Unit/ml Soln (Insulin Glargine) .Marland Kitchen.. 100 Units Daily As Directed 3)  Metformin Hcl 500 Mg Tabs (Metformin Hcl) .... Take One By Mouth Bid 4)  Levoxyl 100 Mcg Tabs (Levothyroxine Sodium) .... Take One By Mouth Daily 5)  Vicodin 5-500 Mg  Tabs (Hydrocodone-Acetaminophen) .Marland Kitchen.. 1 Every 4 Hours As Needed For Severe Pain 6)  Novolog Flexpen 100 Unit/ml  Soln (Insulin Aspart) .... Inject 40 Units Before Breakfast and 45 Units Before Lunch and Dinner 7)  Lorazepam 0.5 Mg Tabs (Lorazepam) .... Take 1-2  Tablets  By Mouth Two Times A Day As Needed Nerves 8)  Glipizide 5 Mg Tabs (Glipizide) .... Take 1 By Mouth Two Times A Day 9)  Citalopram Hydrobromide 40 Mg Tabs (Citalopram Hydrobromide) .... Take 1 1/2 Tablet By Mouth Once Daily 10)  Estrace 0.1 Mg/gm Crea (Estradiol) .... Apply 1/4 Tube Three Times A Week 11)  Lisinopril-Hydrochlorothiazide 20-25 Mg Tabs (Lisinopril-Hydrochlorothiazide) .Marland Kitchen.. 1 Daily For High Blood Pressure 12)  Trazodone Hcl 100 Mg Tabs (Trazodone Hcl) .... Take 1/2 Tab The First 2 Nights, Then 1 Full Tab If Not Tired From Med in The Am 13)  Bupropion Hcl 150 Mg Xr12h-Tab (Bupropion Hcl) .... Take 1 By Mouth Two Times A Day 14)  Relion Blood Glucose Test  Strp (Glucose Blood) .... Patient Test  Blood Sugar Three Times A Day 15)  Fish Oil  Caps (Omega-3 Fatty Acids Caps) .... 2 At Night 16)  Aleve 220 Mg  Caps (Naproxen Sodium) .... 2 Tablets in Am By Mouth 17)  Unifine Pentips 31g X 8 Mm  Misc (Insulin Pen Needle) .... Use As Directed 18)  Zyrtec Allergy 10 Mg Tabs (Cetirizine Hcl) .... Take 1/2 By Mouth Once Daily 19)  Fluocinolone Acetonide 0.025 % Crea (Fluocinolone Acetonide) .... Apply To Area Two Times A Day  Allergies: 1)  ! Lisinopril-Hydrochlorothiazide (Lisinopril-Hydrochlorothiazide) 2)  Demerol (Meperidine Hcl) 3)  Codeine Phosphate (Codeine Phosphate) 4)  * Bextra (Valdecoxib) 5)  Effexor 6)  Cymbalta (Duloxetine Hcl)  Past History:  Past Medical History: Last updated: 11-05-2006 Colonic polyps, hx of Depression--paranoid tendencies Diabetes mellitus, type II GERD Hyperlipidemia Hypertension Hypothyroidism Anxiety Obesity Arthritis--L knee  Past Surgical History: Last updated: 09/23/2009 Heel spur left 03/03 Left knee arthroscoopy Katrinka Blazing) 2001 Hysterectomy 1977 ( one ovary left) bladder tacking 1977 Cervical spine repairs 1999 DEXA Normal 05/02 Abd. 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 GI:  Denies nausea and vomiting.  Physical Exam  General:  alert and normal appearance.   Skin:  multiple small insect bites on face, arms, and legs  bilaterally.  None with surrounding erythema, drainage. Psych:  normally interactive, good eye contact, not anxious appearing, and not depressed appearing.     Impression & Recommendations:  Problem # 1:  INSECT BITE (ICD-919.4) Assessment New None appear infected. See pt instructions for details- advised keeping them clean. Benadryl as needed itching and Lidex cream as needed.  Complete Medication List: 1)  Simvastatin 80 Mg Tabs (Simvastatin) .... Take 1/2 tab  by mouth daily 2)  Lantus 100 Unit/ml Soln (Insulin glargine) .Marland Kitchen.. 100 units daily as directed 3)  Metformin Hcl 500 Mg Tabs (Metformin hcl) .... Take one by mouth bid 4)  Levoxyl 100 Mcg Tabs (Levothyroxine sodium) .... Take one by mouth daily 5)  Vicodin 5-500 Mg Tabs (Hydrocodone-acetaminophen) .Marland Kitchen.. 1 every 4 hours as needed for severe pain 6)  Novolog Flexpen 100 Unit/ml Soln (Insulin aspart) .... Inject 40 units before breakfast and 45 units before lunch and dinner 7)  Lorazepam 0.5 Mg Tabs (Lorazepam) .... Take 1-2  tablets  by mouth two times a day as needed nerves 8)  Glipizide 5 Mg Tabs (Glipizide) .... Take 1 by mouth two times a day 9)  Citalopram Hydrobromide 40 Mg Tabs (Citalopram hydrobromide) .... Take 1 1/2 tablet by mouth once daily 10)  Estrace 0.1 Mg/gm Crea (Estradiol) .... Apply 1/4 tube three times a week 11)  Lisinopril-hydrochlorothiazide 20-25 Mg Tabs (Lisinopril-hydrochlorothiazide) .Marland Kitchen.. 1 daily for high blood pressure 12)  Trazodone Hcl 100 Mg Tabs (Trazodone hcl) .... Take 1/2 tab the first 2 nights, then 1 full tab if not tired from med in the am 13)  Bupropion Hcl 150 Mg Xr12h-tab (Bupropion hcl) .... Take 1 by mouth two times a day 14)  Relion Blood Glucose Test Strp (Glucose blood) .... Patient test blood sugar three times a day 15)  Fish Oil Caps (Omega-3 fatty acids caps) .... 2 at night 16)  Aleve 220 Mg Caps (Naproxen sodium) .... 2 tablets in am by mouth 17)  Unifine Pentips 31g X 8 Mm Misc  (Insulin pen needle) .... Use as directed 18)  Zyrtec Allergy 10 Mg Tabs (Cetirizine hcl) .... Take 1/2 by mouth once daily 19)  Fluocinolone Acetonide 0.025 % Crea (Fluocinolone acetonide) .... Apply to area two times a day  Patient Instructions: 1)  Make sure you wash the bites with soap and water daily. 2)  Use Benadryl as need for itching. 3)  Use the Lidex cream twice daily (be careful when applying it to your face). Prescriptions: FLUOCINOLONE ACETONIDE 0.025 % CREA (FLUOCINOLONE ACETONIDE) apply to area two times a day  #30 g x 0   Entered and Authorized by:   Ruthe Mannan MD   Signed by:   Ruthe Mannan MD on 01/06/2010   Method used:   Electronically to        [facility] [address] (retail)       [address]       [place]  [place], [place]  [address]       Ph: [phone]       Fax: [phone]   RxID:   [id number]   Current Allergies (reviewed today): ! LISINOPRIL-HYDROCHLOROTHIAZIDE (LISINOPRIL-HYDROCHLOROTHIAZIDE) DEMEROL (MEPERIDINE HCL) CODEINE PHOSPHATE (CODEINE PHOSPHATE) * BEXTRA (VALDECOXIB) EFFEXOR CYMBALTA (DULOXETINE HCL)

## 2010-07-25 NOTE — Progress Notes (Signed)
Summary: needs something for stress  Phone Note Call from Patient Call back at Work Phone [phone]   Caller: Patient Call For: [name] MD Summary of Call: Pt is having carotid artery surgery next wednesday and says she needs somthing stronger for her nerves then the one lorazepam that she takes.  Advised pt ok to take 2 a day, per midicine list.  Please advise.  [facility]ses [facility]. Initial call taken by: Lowella Petties CMA,  August 10, 2009 10:24 AM  Follow-up for Phone Call        for now, if 1 is not enough, she can try up to 2 two times a day  okay to send new Rx for #60 x 1 Follow-up by: [name] MD,  August 10, 2009 11:17 AM  Additional Follow-up for Phone Call Additional follow up Details #1::        Rx Called In, patient notified. Additional Follow-up by: Linde Gillis CMA Duncan Dull),  August 10, 2009 11:37 AM    New/Updated Medications: LORAZEPAM 0.5 MG TABS (LORAZEPAM) take 1-2  tablets  by mouth two times a day as needed nerves Prescriptions: LORAZEPAM 0.5 MG TABS (LORAZEPAM) take 1-2  tablets  by mouth two times a day as needed nerves  #60 x 1   Entered by:   Linde Gillis CMA (AAMA)   Authorized by:   [name] MD   Signed by:   Linde Gillis CMA (AAMA) on 08/10/2009   Method used:   Telephoned to ...       [facility] [address] (retail)       [address]       [place], [place]  [address]       Ph: [phone]       Fax: [phone]   RxID:   [id number]

## 2010-07-25 NOTE — Progress Notes (Signed)
Summary: CITALOPRAM  Phone Note Outgoing Call   Summary of Call: Please call her The current recommendations for citalopram have changed and now highest recommended dose is 40mg  Please have her cut her dose to 40mg  daily and call if she has any mood problems on the lower dose please update the med list Initial call taken by: [name] MD,  April 05, 2010 1:52 PM  Follow-up for Phone Call        Tried to reach patient at home and at work and she was not at either place.Consuello Masse CMA     Spoke with patient and advised results. Pt states she will call if any problems. Pt wanted to know what else she can do for her sleep problems? pt is taking Trazdone 100mg  1 by mouth and pt states she is still waking up during the night maybe 2-3 times per night, is there anything else she can take? DeShannon Katrinka Blazing CMA Duncan Dull)  April 06, 2010 9:13 AM   Okay to try 1.5 or even 2 of the trazodone at night If that helps, we can change her Rx Follow-up by: [name] MD,  April 06, 2010 12:58 PM  Additional Follow-up for Phone Call Additional follow up Details #1::        Spoke with patient and advised results. Pt will try this on the weekend and will let us know if that helps. Additional Follow-up by: Mervin Hack CMA Duncan Dull),  April 06, 2010 3:15 PM    New/Updated Medications: CITALOPRAM HYDROBROMIDE 40 MG TABS (CITALOPRAM HYDROBROMIDE) take 1 tablet by mouth once daily

## 2010-07-25 NOTE — Progress Notes (Signed)
Summary: regarding meds  Phone Note Call from Patient Call back at Work Phone [phone]   Caller: Patient Call For: [name] MD Summary of Call: Pt states the trazadone is not helping her stay asleep.  She is taking one and a half at bedtime.  If she takes 2 she is very drowsy the next day.  The citalopram is not helping at the current dose either.  She feels angry all the time.  She doesnt know if this is coming from lack of sleep or the lower citalopram dose. Initial call taken by: Lowella Petties CMA,  April 14, 2010 2:36 PM  Follow-up for Phone Call        Have her go back up to the 60mg  daily dose of the citalopram. She has done on this for quite some time  If she is not feeling better within a week or so, I should meet with her to discuss this Follow-up by: [name] MD,  April 16, 2010 8:03 AM  Additional Follow-up for Phone Call Additional follow up Details #1::        spoke with patient and she is afraid to increase Citalopram to 60mg  if it's dangerous to her health, she is currently taking 40mg . Pt would also like something else to help her sleep, she is waking up every 2 hrs and her daughter states her mood has changed, per daughter she is "mean as a snake" so pt would like to stay on 40mg  of Citalopram if possible. Pt is willing to come in for a visit, please advise. DeShannon Smith CMA Duncan Dull)  April 17, 2010 8:15 AM   Please get her in sometime this week I do expect to see patients on Friday afternoon here, if nothing else available Additional Follow-up by: [name] MD,  April 17, 2010 11:18 AM    Additional Follow-up for Phone Call Additional follow up Details #2::    Pt. scheduled appt. for 04/18/10 @ 9:45. Follow-up by: Beau Fanny,  April 17, 2010 11:35 AM

## 2010-07-25 NOTE — Progress Notes (Signed)
Summary: surgical clearance  Phone Note From Other Clinic   Caller: nurse [facility] Summary of Call: Pe rbrandy Dr Evette Cristal ofc [phone] needs clearance on pt. Procedure is scheduled for 16th. Initial call taken by: Edman Circle,  August 04, 2009 1:29 PM  Follow-up for Phone Call        spoke with brandy, pt having carotid endartectomy and she needed copy of recent nuclear test. report faxed. Deliah Goody, RN  August 04, 2009 2:47 PM

## 2010-07-25 NOTE — Assessment & Plan Note (Signed)
Summary: Cardiology Nuclear Study  Nuclear Med Background Indications for Stress Test: Evaluation for Ischemia, Surgical Clearance  Indications Comments: Pending (R) CEA by Dr. Virgia Land ([place])  History: Echo, Heart Catheterization, Myocardial Perfusion Study   Symptoms: Chest Pain, Chest Pressure, Dizziness, DOE, Light-Headedness, Palpitations    Nuclear Pre-Procedure Cardiac Risk Factors: Carotid Disease, Hypertension, IDDM Type 2, Lipids Caffeine/Decaff Intake: None NPO After: 9:00 PM Lungs: clear IV 0.9% NS with Angio Cath: 22g     IV Site: (R) Forearm IV Started by: Irean Hong RN Chest Size (in) 48     Cup Size DD     Height (in): 61 Weight (lb): 293 BMI: 55.56  Nuclear Med Study 1 or 2 day study:  2 day     Stress Test Type:  Eugenie Birks Reading MD:  Olga Millers, MD     Referring MD:  Maudry Diego Resting Radionuclide:  Technetium 38m Tetrofosmin     Resting Radionuclide Dose:  33.0 mCi  Stress Radionuclide:  Technetium 52m Tetrofosmin     Stress Radionuclide Dose:  33.0 mCi   Stress Protocol   Lexiscan: 0.4 mg   Stress Test Technologist:  Milana Na EMT-P     Nuclear Technologist:  Burna Mortimer Deal RT-N  Rest Procedure  Myocardial perfusion imaging was performed at rest 45 minutes following the intravenous administration of Myoview Technetium 35m Tetrofosmin.  Stress Procedure  The patient received IV Lexiscan 0.4 mg over 15-seconds.  Myoview injected at 30-seconds.  There were no significant changes with infusion.  Quantitative spect images were obtained after a 45 minute delay.  QPS Raw Data Images:  Normal; no motion artifact; normal heart/lung ratio. Stress Images:  NI: Uniform and normal uptake of tracer in all myocardial segments. Rest Images:  Normal homogeneous uptake in all areas of the myocardium. Subtraction (SDS):  Normal Transient Ischemic Dilatation:  .98  (Normal <1.22)  Lung/Heart Ratio:  .29  (Normal <0.45)  Quantitative Gated Spect  Images QGS EDV:  97 ml QGS ESV:  33 ml QGS EF:  66 % QGS cine images:  normal  Findings Normal nuclear study      Overall Impression  Exercise Capacity: Lexiscan BP Response: Normal blood pressure response. Clinical Symptoms: Headache and Dizzy ECG Impression: No significant ST segment change suggestive of ischemia. Overall Impression: normal with mild breast attenuation  Appended Document: Cardiology Nuclear Study Please call patient   Stress test is normal Also call Dr Ernestene Mention faxed but confirm that she is medically cleared for surgery  Appended Document: Cardiology Nuclear Study called Dr. Vickie Epley office and left message for nurse to return my call.  Appended Document: Cardiology Nuclear Study Brandy called back from Dr. Luan Moore office and stated that all they need is a note saying patient is cleared for surgery. Please advise.  Appended Document: Cardiology Nuclear Study note written  please fax  Appended Document: Cardiology Nuclear Study faxed

## 2010-07-25 NOTE — Progress Notes (Signed)
Summary: wants to try generic ambien  Phone Note Call from Patient Call back at Work Phone [phone]   Caller: Patient Call For: [name] MD Summary of Call: Pt is asking if she can try plain generic ambien.  She is still not sleeping, has not been taking lorazepam.  Uses [facility] [address]. Initial call taken by: Lowella Petties CMA, AAMA,  April 26, 2010 11:04 AM  Follow-up for Phone Call        okay to send Rx for zolpidem 5mg   1-2 at bedtime as needed for sleep She should not use this more than 2-3 times per week as it almost always causes dependence if used regularly #60 x 0 Follow-up by: [name] MD,  April 26, 2010 1:35 PM  Additional Follow-up for Phone Call Additional follow up Details #1::        Rx Called In, Spoke with patient and advised results.  Additional Follow-up by: Mervin Hack CMA Duncan Dull),  April 26, 2010 4:22 PM    New/Updated Medications: ZOLPIDEM TARTRATE 5 MG TABS (ZOLPIDEM TARTRATE) 1-2 at bedtime as needed for sleep Prescriptions: ZOLPIDEM TARTRATE 5 MG TABS (ZOLPIDEM TARTRATE) 1-2 at bedtime as needed for sleep  #60 x 0   Entered by:   Mervin Hack CMA (AAMA)   Authorized by:   [name] MD   Signed by:   Mervin Hack CMA (AAMA) on 04/26/2010   Method used:   Telephoned to ...       [facility] [address] (retail)       [address]       [place], [place]  [address]       Ph: [phone]       Fax: [phone]   RxID:   [id number]

## 2010-07-25 NOTE — Miscellaneous (Signed)
Summary: med list update- novolog  Clinical Lists Changes  Medications: Changed medication from NOVOLOG FLEXPEN 100 UNIT/ML  SOLN (INSULIN ASPART) 35-40 units three times a day as directed , dispense with needles to NOVOLOG FLEXPEN 100 UNIT/ML  SOLN (INSULIN ASPART) Inject 40 units before breakfast and 45 units before lunch and dinner     Prior Medications: SIMVASTATIN 80 MG TABS (SIMVASTATIN) Take one by mouth daily LANTUS 100 UNIT/ML SOLN (INSULIN GLARGINE) 100 units daily as directed METFORMIN HCL 500 MG TABS (METFORMIN HCL) Take one by mouth bid LEVOXYL 100 MCG TABS (LEVOTHYROXINE SODIUM) Take one by mouth daily FISH OIL  CAPS (OMEGA-3 FATTY ACIDS CAPS) 2 at night VICODIN 5-500 MG  TABS (HYDROCODONE-ACETAMINOPHEN) 1 every 4 hours as needed for severe pain ALEVE 220 MG  CAPS (NAPROXEN SODIUM) 2 tablets in am by mouth UNIFINE PENTIPS 31G X 8 MM  MISC (INSULIN PEN NEEDLE) use as directed NOVOLOG FLEXPEN 100 UNIT/ML  SOLN (INSULIN ASPART) Inject 40 units before breakfast and 45 units before lunch and dinner ONETOUCH ULTRA TEST  STRP (GLUCOSE BLOOD) check three times a day or as directed LORAZEPAM 0.5 MG TABS (LORAZEPAM) take 1-2  tablets  by mouth two times a day as needed nerves GLIPIZIDE 5 MG TABS (GLIPIZIDE) take 1 by mouth two times a day CITALOPRAM HYDROBROMIDE 40 MG TABS (CITALOPRAM HYDROBROMIDE) take 1 1/2 tablet by mouth once daily ESTRACE 0.1 MG/GM CREA (ESTRADIOL) apply 1/4 tube three times a week LISINOPRIL-HYDROCHLOROTHIAZIDE 20-25 MG TABS (LISINOPRIL-HYDROCHLOROTHIAZIDE) 1 daily for high blood pressure TRAZODONE HCL 100 MG TABS (TRAZODONE HCL) Take 1/2 tab the first 2 nights, then 1 full tab if not tired from med in the AM ZYRTEC ALLERGY 10 MG TABS (CETIRIZINE HCL) take 1/2 by mouth once daily BUPROPION HCL 100 MG XR12H-TAB (BUPROPION HCL) 1 tab by mouth two times a day for depression Current Allergies: ! LISINOPRIL-HYDROCHLOROTHIAZIDE  (LISINOPRIL-HYDROCHLOROTHIAZIDE) DEMEROL (MEPERIDINE HCL) CODEINE PHOSPHATE (CODEINE PHOSPHATE) * BEXTRA (VALDECOXIB) EFFEXOR CYMBALTA (DULOXETINE HCL)

## 2010-07-25 NOTE — Assessment & Plan Note (Signed)
Summary: 2 M F/U DLO   Vital Signs:  Patient profile:   60 year old female Weight:      292 pounds Temp:     98.8 degrees F oral BP sitting:   128 / 90  (left arm) Cuff size:   large  Vitals Entered By: Mervin Hack CMA Duncan Dull) (December 02, 2009 2:38 PM) CC: 2 month follow-up   History of Present Illness: Doing okay  did need to increase the bupropion recently (see phone note) gets a little jittery at times (?too much) Mood is better  Did increase insulin Having low sugar reactions due to low carb diet "New Atkins" --healthier has need to cut back on her insulin again due to this  Allergies: 1)  ! Lisinopril-Hydrochlorothiazide (Lisinopril-Hydrochlorothiazide) 2)  Demerol (Meperidine Hcl) 3)  Codeine Phosphate (Codeine Phosphate) 4)  * Bextra (Valdecoxib) 5)  Effexor 6)  Cymbalta (Duloxetine Hcl)  Past History:  Past medical, surgical, family and social histories (including risk factors) reviewed for relevance to current acute and chronic problems.  Past Medical History: Reviewed history from 10/31/2006 and no changes required. Colonic polyps, hx of Depression--paranoid tendencies Diabetes mellitus, type II GERD Hyperlipidemia Hypertension Hypothyroidism Anxiety Obesity Arthritis--L knee  Past Surgical History: Reviewed history from 09/23/2009 and no changes required. Heel spur left 03/03 Left knee arthroscoopy Katrinka Blazing) 2001 Hysterectomy 1977 ( one ovary left) bladder tacking 1977 Cervical spine repairs 1999 DEXA Normal 05/02 Abd. CT negative, Pitutary MRI negative 2002 ECHO normal03/04 Stress test negative (no cardiolite) 07/00 Right CEA  2/11    Dr Evette Cristal  Family History: Reviewed history from 10/31/2006 and no changes required. Father: Died at age 70, CAD, DM Mother: Alive, breast cancer, DM, CAD Siblings 2 One R.A., one possible throat cancer, HTN  CAD- parents HTN- sister  Social History: Reviewed history from 03/15/2008 and no changes  required. 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   Psych:  normally interactive, good eye contact, not anxious appearing, and not depressed appearing.     Impression & Recommendations:  Problem # 1:  DEPRESSION (ICD-311) Assessment Improved doing better with increased bupropion dose continue current dose despite mild jitteriness  Her updated medication list for this problem includes:    Lorazepam 0.5 Mg Tabs (Lorazepam) .Marland Kitchen... Take 1-2  tablets  by mouth two times a day as needed nerves    Citalopram Hydrobromide 40 Mg Tabs (Citalopram hydrobromide) .Marland Kitchen... Take 1 1/2 tablet by mouth once daily    Trazodone Hcl 100 Mg Tabs (Trazodone hcl) .Marland Kitchen... Take 1/2 tab the first 2 nights, then 1 full tab if not tired from med in the am    Bupropion Hcl 150 Mg Xr12h-tab (Bupropion hcl) .Marland Kitchen... Take 1 by mouth two times a day  Problem # 2:  HYPERLIPIDEMIA/ MILD (ICD-272.4) Assessment: Unchanged mild elevated LFTs will cut dose in half  Her updated medication list for this problem includes:    Simvastatin 80 Mg Tabs (Simvastatin) .Marland Kitchen... Take 1/2 tab  by mouth daily  Labs Reviewed: SGOT: 76 (09/23/2009)   SGPT: 73 (09/23/2009)   HDL:41 (03/11/2009), 35 (03/15/2008)  LDL:85 (03/11/2009), 60 (03/15/2008)  Chol:158 (03/11/2009), 126 (03/15/2008)  Trig:158 (03/11/2009), 156 (03/15/2008)  Problem # 3:  DIABETES MELLITUS, TYPE II, WITH NEUROLOGICAL COMPLICATIONS (ICD-250.60) Assessment: Improved doing better wtih carb restriction and weight loss will recheck labs next time  Her updated medication list for this problem includes:    Lantus 100 Unit/ml  Soln (Insulin glargine) .Marland KitchenMarland KitchenMarland KitchenMarland Kitchen 100 units daily as directed    Metformin Hcl 500 Mg Tabs (Metformin hcl) .Marland Kitchen... Take one by mouth bid    Novolog Flexpen 100 Unit/ml Soln (Insulin aspart) ..... Inject 40 units before breakfast and 45 units before lunch and dinner    Glipizide 5 Mg Tabs (Glipizide) .Marland Kitchen... Take 1 by mouth two times a day    Lisinopril-hydrochlorothiazide 20-25 Mg Tabs (Lisinopril-hydrochlorothiazide) .Marland Kitchen... 1 daily for high blood pressure  Complete Medication List: 1)  Simvastatin 80 Mg Tabs (Simvastatin) .... Take 1/2 tab  by mouth daily 2)  Lantus 100 Unit/ml Soln (Insulin glargine) .Marland Kitchen.. 100 units daily as directed 3)  Metformin Hcl 500 Mg Tabs (Metformin hcl) .... Take one by mouth bid 4)  Levoxyl 100 Mcg Tabs (Levothyroxine sodium) .... Take one by mouth daily 5)  Vicodin 5-500 Mg Tabs (Hydrocodone-acetaminophen) .Marland Kitchen.. 1 every 4 hours as needed for severe pain 6)  Novolog Flexpen 100 Unit/ml Soln (Insulin aspart) .... Inject 40 units before breakfast and 45 units before lunch and dinner 7)  Lorazepam 0.5 Mg Tabs (Lorazepam) .... Take 1-2  tablets  by mouth two times a day as needed nerves 8)  Glipizide 5 Mg Tabs (Glipizide) .... Take 1 by mouth two times a day 9)  Citalopram Hydrobromide 40 Mg Tabs (Citalopram hydrobromide) .... Take 1 1/2 tablet by mouth once daily 10)  Estrace 0.1 Mg/gm Crea (Estradiol) .... Apply 1/4 tube three times a week 11)  Lisinopril-hydrochlorothiazide 20-25 Mg Tabs (Lisinopril-hydrochlorothiazide) .Marland Kitchen.. 1 daily for high blood pressure 12)  Trazodone Hcl 100 Mg Tabs (Trazodone hcl) .... Take 1/2 tab the first 2 nights, then 1 full tab if not tired from med in the am 13)  Bupropion Hcl 150 Mg Xr12h-tab (Bupropion hcl) .... Take 1 by mouth two times a day 14)  Relion Blood Glucose Test Strp (Glucose blood) .... Patient test blood sugar three times a day 15)  Fish Oil Caps (Omega-3 fatty acids caps) .... 2 at night 16)  Aleve 220 Mg Caps (Naproxen sodium) ....  2 tablets in am by mouth 17)  Unifine Pentips 31g X 8 Mm Misc (Insulin pen needle) .... Use as directed 18)  Zyrtec Allergy 10 Mg Tabs (Cetirizine hcl) .... Take 1/2 by mouth once daily  Patient Instructions: 1)  Please schedule a follow-up appointment in 3 months .  Prescriptions: LANTUS 100 UNIT/ML SOLN (INSULIN GLARGINE) 100 units daily as directed  #30 [place] x 11   Entered by:   Mervin Hack CMA (AAMA)   Authorized by:   [name] MD   Signed by:   Mervin Hack CMA (AAMA) on 12/02/2009   Method used:   Electronically to        [facility] [address] (retail)       [address]       [place], [place]  [address]       Ph: [phone]       Fax: [phone]   RxID:   [id number]   Current Allergies (reviewed today): ! LISINOPRIL-HYDROCHLOROTHIAZIDE (LISINOPRIL-HYDROCHLOROTHIAZIDE) DEMEROL (MEPERIDINE HCL) CODEINE PHOSPHATE (CODEINE PHOSPHATE) * BEXTRA (VALDECOXIB) EFFEXOR  CYMBALTA (DULOXETINE HCL)

## 2010-07-25 NOTE — Progress Notes (Signed)
Summary: wants to increase wellbutrin dose  Phone Note Call from Patient Call back at Work Phone [phone]   Caller: Patient Call For: [name] MD Summary of Call: Pt thinks she needs to increase her wellbutrin dose.  She is currently taking 100 mg two times a day.  She says she is still getting very angry about things, thinks her current dose has had enough time to work.  Uses [facility] [address] if new script is called in. Initial call taken by: Lowella Petties CMA,  October 05, 2009 3:37 PM  Follow-up for Phone Call        okay to increase to 150mg  two times a day  1 year Rx will review further at June visit but have her call if still no better in the next 3-4 weeks Follow-up by: [name] MD,  October 06, 2009 2:03 PM  Additional Follow-up for Phone Call Additional follow up Details #1::        Rx Called In, Spoke with patient and advised results.  Additional Follow-up by: Mervin Hack CMA Duncan Dull),  October 06, 2009 3:08 PM    New/Updated Medications: BUPROPION HCL 150 MG XR12H-TAB (BUPROPION HCL) take 1 by mouth two times a day Prescriptions: BUPROPION HCL 150 MG XR12H-TAB (BUPROPION HCL) take 1 by mouth two times a day  #60 x 12   Entered by:   Mervin Hack CMA (AAMA)   Authorized by:   [name] MD   Signed by:   Mervin Hack CMA (AAMA) on 10/06/2009   Method used:   Electronically to        [facility] [address] (retail)       [address]       [place], [place]  [address]       Ph: [phone]       Fax: [phone]   RxID:   [id number]

## 2010-07-25 NOTE — Progress Notes (Signed)
Summary: ZOLPIDEM TARTRATE   Phone Note Refill Request Message from:  Jordan Hawks [phone] on May 29, 2010 9:32 AM  Refills Requested: Medication #1:  ZOLPIDEM TARTRATE 5 MG TABS 1-2 at bedtime as needed for sleep   Last Refilled: 04/26/2010 E-Scribe Request    Method Requested: Telephone to Pharmacy Initial call taken by: Mervin Hack CMA Duncan Dull),  May 29, 2010 9:32 AM  Follow-up for Phone Call        okay #60 x 1 Follow-up by: [name] MD,  May 29, 2010 1:53 PM  Additional Follow-up for Phone Call Additional follow up Details #1::        Rx called to pharmacy Additional Follow-up by: DeShannon Smith CMA Duncan Dull),  May 29, 2010 3:10 PM    Prescriptions: ZOLPIDEM TARTRATE 5 MG TABS (ZOLPIDEM TARTRATE) 1-2 at bedtime as needed for sleep  #60 x 1   Entered by:   Mervin Hack CMA (AAMA)   Authorized by:   [name] MD   Signed by:   Mervin Hack CMA (AAMA) on 05/29/2010   Method used:   Telephoned to ...       [facility] [address] (retail)       [address]       [place], [place]  [address]       Ph: [phone]       Fax: [phone]   RxID:   [id number]

## 2010-07-25 NOTE — Assessment & Plan Note (Signed)
Summary: 3 M F/U DLO  r/s from 03/10/10   Vital Signs:  Patient profile:   60 year old female Height:      61 inches Weight:      265 pounds BMI:     50.25 Temp:     98.5 degrees F oral Pulse rate:   68 / minute Pulse rhythm:   regular BP sitting:   92 / 60  (left arm) Cuff size:   large  Vitals Entered By: Mervin Hack CMA Duncan Dull) (March 03, 2010 2:06 PM) CC: 3 month follow up   History of Present Illness: Has done well  following the Atkin's diet This plan holds her better---has precooked bacon and eggs and she is good till lunch Has had some sig low sugar reactions--- skips lunch insulin at times, now most days Sugars have been below 120 --even 1 hour after meals  Doesn't check BP but could tell it was down More energy now Still unable to do much walking  No chest pain No SOB  Mood is still up and down no jitteriness Finds herself getting aggravated ---like with work stress Only needs occ lorazepam  Now on 1/2 of simvastatin No myalgias of note  Allergies: 1)  ! Lisinopril-Hydrochlorothiazide (Lisinopril-Hydrochlorothiazide) 2)  Demerol (Meperidine Hcl) 3)  Codeine Phosphate (Codeine Phosphate) 4)  * Bextra (Valdecoxib) 5)  Effexor 6)  Cymbalta (Duloxetine Hcl)  Past History:  Past medical, surgical, family and social histories (including risk factors) reviewed for relevance to current acute and chronic problems.  Past Medical History: Reviewed history from 10/31/2006 and no changes required. Colonic polyps, hx of Depression--paranoid tendencies Diabetes mellitus, type II GERD Hyperlipidemia Hypertension Hypothyroidism Anxiety Obesity Arthritis--L knee  Past Surgical History: Reviewed history from 09/23/2009 and no changes required. Heel spur left 03/03 Left knee arthroscoopy Katrinka Blazing) 2001 Hysterectomy 1977 ( one ovary left) bladder tacking 1977 Cervical spine repairs 1999 DEXA Normal 05/02 Abd. CT negative, Pitutary MRI negative  2002 ECHO normal03/04 Stress test negative (no cardiolite) 07/00 Right CEA  2/11    Dr Evette Cristal  Family History: Reviewed history from 10/31/2006 and no changes required. Father: Died at age 82, CAD, DM Mother: Alive, breast cancer, DM, CAD Siblings 2 One R.A., one possible throat cancer, HTN  CAD- parents HTN- sister  Social History: Reviewed history from 03/15/2008 and no changes required. 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   Neck:  supple, no masses, no thyromegaly, no carotid bruits, and no cervical lymphadenopathy.   Lungs:  normal respiratory effort, no intercostal retractions, no accessory muscle use, and normal breath sounds.   Heart:  normal rate, regular rhythm, no murmur, and no gallop.   Pulses:  1+ in feet Extremities:  no edema Skin:  no suspicious lesions and no ulcerations.   Psych:  normally interactive, good eye contact, not anxious appearing, and not depressed appearing.    Diabetes Management Exam:    Foot Exam (with socks and/or shoes not present):       Sensory-Pinprick/Light touch:          Left medial foot (L-4): normal          Left dorsal foot (L-5): normal  Left lateral foot (S-1): normal          Right medial foot (L-4): normal          Right dorsal foot (L-5): normal          Right lateral foot (S-1): normal       Inspection:          Left foot: normal          Right foot: normal       Nails:          Left foot: normal          Right foot: normal   Impression & Recommendations:  Problem # 1:  DIABETES MELLITUS, TYPE II, WITH NEUROLOGICAL COMPLICATIONS (ICD-250.60) Assessment Improved  much better on  Atkins having sig low sugar reactions If A1c is much better as expected, will stop the novolog  Her updated medication list for this problem includes:    Lantus 100 Unit/ml Soln (Insulin glargine) .Marland KitchenMarland KitchenMarland KitchenMarland Kitchen 100 units daily as directed    Metformin Hcl 500 Mg Tabs (Metformin hcl) .Marland Kitchen... Take one by mouth bid    Novolog Flexpen 100 Unit/ml Soln (Insulin aspart) ..... Inject 30 units before breakfast and 45 units before lunch and dinner    Glipizide 5 Mg Tabs (Glipizide) .Marland Kitchen... Take 1 by mouth two times a day    Lisinopril-hydrochlorothiazide 20-25 Mg Tabs (Lisinopril-hydrochlorothiazide) .Marland Kitchen... 1 daily for high blood pressure  Labs Reviewed: Creat: 0.71 (09/23/2009)     Last Eye Exam: No retinopathy (07/26/2008) Reviewed HgBA1c results: 8.7 (09/23/2009)  8.0 (03/11/2009)  Orders: T- Hemoglobin A1C ([id number])  Problem # 2:  DEPRESSION (ICD-311) Assessment: Unchanged persistent mild symptoms but generally at satisfactory level  Her updated medication list for this problem includes:    Lorazepam 0.5 Mg Tabs (Lorazepam) .Marland Kitchen... Take 1-2  tablets  by mouth two times a day as needed nerves    Citalopram Hydrobromide 40 Mg Tabs (Citalopram hydrobromide) .Marland Kitchen... Take 1 1/2 tablet by mouth once daily    Trazodone Hcl 100 Mg Tabs (Trazodone hcl) .Marland Kitchen... Take 1/2 tab the first 2 nights, then 1 full tab if not tired from med in the am    Bupropion Hcl 150 Mg Xr12h-tab (Bupropion hcl) .Marland Kitchen... Take 1 by mouth two times a day  Problem # 3:  HYPERTENSION (ICD-401.9) Assessment: Unchanged  also much better on new eating program  Her updated medication list for this problem includes:    Lisinopril-hydrochlorothiazide 20-25 Mg Tabs (Lisinopril-hydrochlorothiazide) .Marland Kitchen... 1 daily for high blood pressure  BP today: 92/60 Prior BP: 120/60 (01/06/2010)  Labs Reviewed: K+: 4.1 (09/23/2009) Creat: : 0.71 (09/23/2009)   Chol: 158 (03/11/2009)   HDL: 41 (03/11/2009)   LDL: 85 (03/11/2009)   TG: 158  (03/11/2009)  Orders: Venipuncture (42706) Specimen Handling (23762) T-Comprehensive Metabolic Panel ([id number])  Problem # 4:  HYPERLIPIDEMIA/ MILD (ICD-272.4) Assessment: Unchanged  will recheck on lower dose  Her updated medication list for this problem includes:    Simvastatin 80 Mg Tabs (Simvastatin) .Marland Kitchen... Take 1/2 tab  by mouth daily  Labs Reviewed: SGOT: 76 (09/23/2009)   SGPT: 73 (09/23/2009)   HDL:41 (03/11/2009), 35 (03/15/2008)  LDL:85 (03/11/2009), 60 (03/15/2008)  Chol:158 (03/11/2009), 126 (03/15/2008)  Trig:158 (03/11/2009), 156 (03/15/2008)  Orders: T-Lipid Profile ([id number])  Complete Medication List: 1)  Simvastatin 80 Mg Tabs (Simvastatin) .... Take 1/2 tab  by mouth daily 2)  Lantus 100 Unit/ml Soln (Insulin glargine) .Marland Kitchen.. 100 units daily as directed 3)  Metformin Hcl 500  Mg Tabs (Metformin hcl) .... Take one by mouth bid 4)  Levoxyl 100 Mcg Tabs (Levothyroxine sodium) .... Take one by mouth daily 5)  Vicodin 5-500 Mg Tabs (Hydrocodone-acetaminophen) .Marland Kitchen.. 1 every 4 hours as needed for severe pain 6)  Novolog Flexpen 100 Unit/ml Soln (Insulin aspart) .... Inject 30 units before breakfast and 45 units before lunch and dinner 7)  Lorazepam 0.5 Mg Tabs (Lorazepam) .... Take 1-2  tablets  by mouth two times a day as needed nerves 8)  Glipizide 5 Mg Tabs (Glipizide) .... Take 1 by mouth two times a day 9)  Citalopram Hydrobromide 40 Mg Tabs (Citalopram hydrobromide) .... Take 1 1/2 tablet by mouth once daily 10)  Lisinopril-hydrochlorothiazide 20-25 Mg Tabs (Lisinopril-hydrochlorothiazide) .Marland Kitchen.. 1 daily for high blood pressure 11)  Trazodone Hcl 100 Mg Tabs (Trazodone hcl) .... Take 1/2 tab the first 2 nights, then 1 full tab if not tired from med in the am 12)  Bupropion Hcl 150 Mg Xr12h-tab (Bupropion hcl) .... Take 1 by mouth two times a day 13)  Estrace 0.1 Mg/gm Crea (Estradiol) .... Apply 1/4 tube three times a week 14)  Relion Blood Glucose Test Strp  (Glucose blood) .... Patient test blood sugar three times a day 15)  Fish Oil Caps (Omega-3 fatty acids caps) .... 2 at night 16)  Aleve 220 Mg Caps (Naproxen sodium) .... 2 tablets in am by mouth 17)  Unifine Pentips 31g X 8 Mm Misc (Insulin pen needle) .... Use as directed 18)  Zyrtec Allergy 10 Mg Tabs (Cetirizine hcl) .... Take 1/2 by mouth once daily 19)  Fluocinolone Acetonide 0.025 % Crea (Fluocinolone acetonide) .... Apply to area two times a day  Patient Instructions: 1)  Please schedule a follow-up appointment in 6 months for physical  Current Allergies (reviewed today): ! LISINOPRIL-HYDROCHLOROTHIAZIDE (LISINOPRIL-HYDROCHLOROTHIAZIDE) DEMEROL (MEPERIDINE HCL) CODEINE PHOSPHATE (CODEINE PHOSPHATE) * BEXTRA (VALDECOXIB) EFFEXOR CYMBALTA (DULOXETINE HCL)

## 2010-07-25 NOTE — Progress Notes (Signed)
Summary: still not sleeping  Phone Note Call from Patient Call back at Work Phone [phone]   Caller: Patient Call For: [name] MD Summary of Call: Pt states depokote has improved her mood but she has still not been able to sleep.  Please advise on what to do. Initial call taken by: Lowella Petties CMA, AAMA,  April 24, 2010 12:00 PM  Follow-up for Phone Call        If her mood is better, the sleep will improve over time Okay to use the lorazepam over the next few nights just to get some better sleep till the effect of the depakote carries over to her sleep Follow-up by: [name] MD,  April 24, 2010 1:57 PM  Additional Follow-up for Phone Call Additional follow up Details #1::        called work number, pt stepped away from her desk and whoever answered asked me to call back. DeShannon Smith CMA Duncan Dull)  April 24, 2010 4:11 PM   Spoke with patient and advised results. Pt doesn't think the depakote will work, but she will try it. DeShannon Smith CMA Duncan Dull)  April 25, 2010 9:07 AM   I don't want to start any other meds at this time [name] MD  April 25, 2010 9:53 AM

## 2010-07-25 NOTE — Letter (Signed)
Summary: Medical Examination for the [facility]  Medical Examination for the [facility]   Imported By: Maryln Gottron 05/19/2010 15:52:45  _____________________________________________________________________  External Attachment:    Type:   Image     Comment:   External Document

## 2010-07-25 NOTE — Progress Notes (Signed)
Summary: doesn't feel that depokote is helping  Phone Note Call from Patient Call back at Work Phone [phone]   Caller: Patient Call For: [name] MD Summary of Call: Patient says that she feels she needs something stronger than the depokote, or maybe even an increase in her dose. She says that she has been a little snappy, feeling very nervous, and has also started gritting her teeth again. She uses [facility] [address] if needed.  Initial call taken by: Melody Comas,  May 05, 2010 9:55 AM  Follow-up for Phone Call        I am not surprised as she is on a low dose Please increase to 500mg  two times a day  she can take 2 of the 250mg  till they run out send new Rx for 500mg     #60 x 2  we will reeval at her appt 12/2 Follow-up by: [name] MD,  May 05, 2010 10:24 AM  Additional Follow-up for Phone Call Additional follow up Details #1::        spoke with patient and she will increase med, also pt states she is willing to change from Lantus to Levemir (form will be scanned in), both rx's sent to pharmacy. Additional Follow-up by: Mervin Hack CMA Duncan Dull),  May 05, 2010 1:03 PM    New/Updated Medications: LEVEMIR 100 UNIT/ML SOLN (INSULIN DETEMIR) 100 units daily as directed DEPAKOTE 500 MG TBEC (DIVALPROEX SODIUM) take 1 by mouth two times a day Prescriptions: DEPAKOTE 500 MG TBEC (DIVALPROEX SODIUM) take 1 by mouth two times a day  #60 x 2   Entered by:   Mervin Hack CMA (AAMA)   Authorized by:   [name] MD   Signed by:   Mervin Hack CMA (AAMA) on 05/05/2010   Method used:   Electronically to        [facility] [address] (retail)       [address]       [place], [place]  [address]       Ph: [phone]       Fax: [phone]   RxID:   [id number] LEVEMIR 100 UNIT/ML SOLN (INSULIN DETEMIR) 100 units daily as directed  #58mth supply x 12   Entered by:   Mervin Hack CMA (AAMA)   Authorized by:   [name] MD   Signed by:   Mervin Hack CMA (AAMA) on 05/05/2010   Method used:   Electronically to        [facility] [address] (retail)       [address]       [place], [place]  [address]       Ph: [phone]       Fax: [phone]   RxID:   [id number]

## 2010-07-25 NOTE — Progress Notes (Signed)
Summary: Rx Bupropion  Phone Note Refill Request Call back at [phone] Message from:  [facility] on January 23, 2010 2:23 PM  Refills Requested: Medication #1:  BUPROPION HCL 150 MG XR12H-TAB take 1 by mouth two times a day   Supply Requested: 3 months   Last Refilled: 12/20/2009 Received faxed refill request, patient is requesting a 90 supply.  Is this ok to refill for 90 days?  If not one month supply will be ok.  Please advise.   Method Requested: Telephone to Pharmacy Initial call taken by: Linde Gillis CMA Duncan Dull),  January 23, 2010 2:24 PM    Prescriptions: BUPROPION HCL 150 MG XR12H-TAB (BUPROPION HCL) take 1 by mouth two times a day  #90 x 3   Entered and Authorized by:   Ruthe Mannan MD   Signed by:   Ruthe Mannan MD on 01/23/2010   Method used:   Electronically to        [facility] [address] (retail)       [address]       [place], [place]  [address]       Ph: [phone]       Fax: [phone]   RxID:   [id number]

## 2010-07-25 NOTE — Letter (Signed)
Summary: [facility]  [facility]   Imported By: Lanelle Bal 08/04/2009 15:14:21  _____________________________________________________________________  External Attachment:    Type:   Image     Comment:   External Document

## 2010-07-25 NOTE — Progress Notes (Signed)
Summary: Dr. Evette Cristal called  Phone Note From Other Clinic Call back at [phone]   Caller: Elon Jester at Dr Luan Moore office Call For: Dr Alphonsus Sias Summary of Call: Left v/m Dr Evette Cristal wanted to speak with Dr. Alphonsus Sias. I called back and Dr. Evette Cristal is in surgery today his office will page him and he will call Dr. Alphonsus Sias back. Initial call taken by: Lewanda Rife LPN,  July 25, 2009 4:23 PM  Follow-up for Phone Call        Dr Evette Cristal on line 4.Lewanda Rife LPN  July 25, 2009 4:28 PM   Has high grade carotid stenosis Needs CEA Feels stress test appropriate before the surgery (as data show MI is most common cause of death in people with carotid disease) will set up stress test. Will probably need adenosine or dipyridamole due to inability to walk much Follow-up by: [name] MD,  July 25, 2009 4:35 PM  Additional Follow-up for Phone Call Additional follow up Details #1::        Adenosine Cardiolite scheduled for 08/01/2009 at 8:00am at [facility]. Will be a two day study due to patients weight. Waitng for authorization from [place]. Additional Follow-up by: Carlton Adam,  July 27, 2009 9:52 AM  New Problems: CAROTID ARTERY STENOSIS (ICD-433.10)   New Problems: CAROTID ARTERY STENOSIS (ICD-433.10)

## 2010-07-25 NOTE — Letter (Signed)
Summary: [facility]  [facility]   Imported By: Lanelle Bal 07/01/2009 10:32:21  _____________________________________________________________________  External Attachment:    Type:   Image     Comment:   External Document  Appended Document: [facility] I&D of abd wall abscess

## 2010-07-27 NOTE — Progress Notes (Signed)
Summary: Anita Small is not working  Phone Note Call from Patient Call back at Work Phone [phone]   Caller: Patient Call For: [name] MD Summary of Call: Pt is not sleeping again.  She says the [place] worked for her for awhile but now she wakes up several times during the night.  She is also taking depakote.  She is asking what else she can try.  The extended release Anita Small is too expensive, unless she can get generic. Initial call taken by: Lowella Petties CMA, AAMA,  June 09, 2010 10:23 AM  Follow-up for Phone Call        we could try the trazodone again While not that helpful the last time, I think that was alone--not with the ambien  IF she agrees, retry the trazodone 100mg  at bedtime #30 x 1 try 1/2 the first two nights, then go up to the full tab  She will need an appt to discuss alternatives if this isn't effective Follow-up by: [name] MD,  June 10, 2010 9:03 AM  Additional Follow-up for Phone Call Additional follow up Details #1::        spoke with patient, pt will try and call if any problems Additional Follow-up by: DeShannon Katrinka Blazing CMA Duncan Dull),  June 12, 2010 12:49 PM    New/Updated Medications: TRAZODONE HCL 100 MG TABS (TRAZODONE HCL) try 1/2 the first two nights, then go up to the full tab Prescriptions: TRAZODONE HCL 100 MG TABS (TRAZODONE HCL) try 1/2 the first two nights, then go up to the full tab  #30 x 1   Entered by:   Mervin Hack CMA (AAMA)   Authorized by:   [name] MD   Signed by:   Mervin Hack CMA (AAMA) on 06/12/2010   Method used:   Electronically to        [facility] [address] (retail)       [address]       [place], [place]  [address]       Ph: [phone]       Fax: [phone]   RxID:   [id number]

## 2010-07-27 NOTE — Letter (Signed)
Summary: Medical Report Form/[facility]  Medical Report Form/[facility]   Imported By: Lanelle Bal 06/09/2010 09:44:08  _____________________________________________________________________  External Attachment:    Type:   Image     Comment:   External Document

## 2010-07-27 NOTE — Progress Notes (Signed)
Summary: refill request for vicodin  Phone Note Refill Request Call back at Work Phone [phone] Message from:  Patient  Refills Requested: Medication #1:  VICODIN 5-500 MG  TABS 1 every 4 hours as needed for severe pain   Last Refilled: 12/09/2008 Phoned request from pt, please [facility] to [facility] [address]  Initial call taken by: Lowella Petties CMA, AAMA,  July 11, 2010 10:03 AM  Follow-up for Phone Call        okay #60 x 0 Follow-up by: [name] MD,  July 11, 2010 1:52 PM  Additional Follow-up for Phone Call Additional follow up Details #1::        Rx called to pharmacy Additional Follow-up by: DeShannon Katrinka Blazing CMA Duncan Dull),  July 11, 2010 4:24 PM    Prescriptions: VICODIN 5-500 MG  TABS (HYDROCODONE-ACETAMINOPHEN) 1 every 4 hours as needed for severe pain  #60 x 0   Entered by:   Mervin Hack CMA (AAMA)   Authorized by:   [name] MD   Signed by:   Mervin Hack CMA (AAMA) on 07/11/2010   Method used:   Telephoned to ...       [facility] [address] (retail)       [address]       [place], [place]  [address]       Ph: [phone]       Fax: [phone]   RxID:   [id number]

## 2010-08-02 NOTE — Progress Notes (Signed)
Summary: zolpidem tartrate  Phone Note Refill Request Message from:  Fax from Pharmacy on July 24, 2010 1:38 PM  Refills Requested: Medication #1:  ZOLPIDEM TARTRATE 5 MG TABS 1-2 at bedtime as needed for sleep   Last Refilled: 05/29/2010 [facility] [address] [phone]. Fax is on your desk.   Initial call taken by: Melody Comas,  July 24, 2010 1:38 PM  Follow-up for Phone Call        Okay #60 x 1 Follow-up by: [name] MD,  July 24, 2010 2:03 PM  Additional Follow-up for Phone Call Additional follow up Details #1::        Rx faxed to pharmacy Additional Follow-up by: DeShannon Smith CMA Duncan Dull),  July 24, 2010 2:32 PM    Prescriptions: ZOLPIDEM TARTRATE 5 MG TABS (ZOLPIDEM TARTRATE) 1-2 at bedtime as needed for sleep  #60 x 1   Entered by:   Mervin Hack CMA (AAMA)   Authorized by:   [name] MD   Signed by:   Mervin Hack CMA (AAMA) on 07/24/2010   Method used:   Handwritten   RxID:   [id number]

## 2010-08-03 ENCOUNTER — Encounter: Payer: Self-pay | Admitting: Internal Medicine

## 2010-08-11 ENCOUNTER — Encounter: Payer: Self-pay | Admitting: Internal Medicine

## 2010-09-19 ENCOUNTER — Ambulatory Visit (INDEPENDENT_AMBULATORY_CARE_PROVIDER_SITE_OTHER): Payer: PRIVATE HEALTH INSURANCE | Admitting: Internal Medicine

## 2010-09-19 ENCOUNTER — Encounter: Payer: Self-pay | Admitting: Internal Medicine

## 2010-09-19 ENCOUNTER — Encounter (INDEPENDENT_AMBULATORY_CARE_PROVIDER_SITE_OTHER): Payer: Self-pay | Admitting: *Deleted

## 2010-09-19 VITALS — BP 124/59 | HR 73 | Temp 98.5°F | Ht 61.0 in | Wt 247.0 lb

## 2010-09-19 DIAGNOSIS — Z0000 Encounter for general adult medical examination without abnormal findings: Secondary | ICD-10-CM

## 2010-09-19 DIAGNOSIS — F3289 Other specified depressive episodes: Secondary | ICD-10-CM

## 2010-09-19 DIAGNOSIS — F329 Major depressive disorder, single episode, unspecified: Secondary | ICD-10-CM

## 2010-09-19 DIAGNOSIS — Z1231 Encounter for screening mammogram for malignant neoplasm of breast: Secondary | ICD-10-CM

## 2010-09-19 DIAGNOSIS — E1149 Type 2 diabetes mellitus with other diabetic neurological complication: Secondary | ICD-10-CM

## 2010-09-19 DIAGNOSIS — I10 Essential (primary) hypertension: Secondary | ICD-10-CM

## 2010-09-19 DIAGNOSIS — E039 Hypothyroidism, unspecified: Secondary | ICD-10-CM

## 2010-09-19 DIAGNOSIS — E785 Hyperlipidemia, unspecified: Secondary | ICD-10-CM

## 2010-09-19 DIAGNOSIS — Z1211 Encounter for screening for malignant neoplasm of colon: Secondary | ICD-10-CM

## 2010-09-19 LAB — BASIC METABOLIC PANEL
Chloride: 110 mEq/L (ref 96–112)
GFR: 103.83 mL/min (ref >60.00)
Potassium: 4 mEq/L (ref 3.5–5.1)
Sodium: 143 mEq/L (ref 135–145)

## 2010-09-19 LAB — MICROALBUMIN / CREATININE URINE RATIO
Creatinine,U: 51.2 mg/dL
Microalb Creat Ratio: 1.2 mg/g (ref 0.0–30.0)

## 2010-09-19 LAB — CBC WITH DIFFERENTIAL/PLATELET
Basophils Relative: 0.6 % (ref 0.0–3.0)
Eosinophils Relative: 1.7 % (ref 0.0–5.0)
HCT: 36.8 % (ref 36.0–46.0)
Lymphs Abs: 2.5 10*3/uL (ref 0.7–4.0)
MCV: 87.8 fl (ref 78.0–100.0)
Monocytes Absolute: 0.6 10*3/uL (ref 0.1–1.0)
Monocytes Relative: 5.9 % (ref 3.0–12.0)
Neutrophils Relative %: 67.5 % (ref 43.0–77.0)
RBC: 4.19 Mil/uL (ref 3.87–5.11)
WBC: 10.1 10*3/uL (ref 4.5–10.5)

## 2010-09-19 LAB — LIPID PANEL
LDL Cholesterol: 71 mg/dL (ref 0–99)
Total CHOL/HDL Ratio: 3
Triglycerides: 103 mg/dL (ref 0.0–149.0)

## 2010-09-19 LAB — HEPATIC FUNCTION PANEL
ALT: 16 U/L (ref 0–35)
AST: 13 U/L (ref 0–37)
Bilirubin, Direct: 0.1 mg/dL (ref 0.0–0.3)
Total Protein: 5.9 g/dL — ABNORMAL LOW (ref 6.0–8.3)

## 2010-09-19 LAB — T4, FREE: Free T4: 0.97 ng/dL (ref 0.60–1.60)

## 2010-09-19 LAB — HEMOGLOBIN A1C: Hgb A1c MFr Bld: 6.4 % (ref 4.6–6.5)

## 2010-09-19 NOTE — Progress Notes (Signed)
Subjective:    Patient ID: Anita Small, female    DOB: 1951/05/23, 60 y.o.   MRN: [id number]  HPI Still on the Atkins diet Works great  Has lost another 5#  Checks sugars about 2-3 times per week Often 60 or less----always under 100 No symptomatic hypoglycemia  Mood has been much better  Past Medical History  Diagnosis Date  . Depression     Paranoid tendencies  . Diabetes mellitus     Type II  . GERD (gastroesophageal reflux disease)   . Hypertension   . Anxiety   . Arthritis     Left knee  . Hx of colonic polyps   . Hyperlipidemia   . Hypothyroidism   . Obesity     Past Surgical History  Procedure Date  . Heel spur surgery 03/03    Left  . Knee arthroscopy 2001    Left   Katrinka Blazing)  . Abdominal hysterectomy 1977    One ovary left  . Bladder repair 1977    Tacking  . Cervical spine surgery 1999  . Ct abd w & pelvis wo cm 2002    Negative. Pituitary normal on MRI  . Carotid endarterectomy 2/11    Dr Evette Cristal  . Cardiovascular stress test 7/00    negative. No cardiolite  . Transthoracic echocardiogram 3/04    normal    Family History  Problem Relation Age of Onset  . Cancer Mother     Breast  . Heart disease Mother   . Diabetes Mother   . Heart disease Father   . Diabetes Father   . Hypertension Sister   . Cancer Sister     ? throat CA  . Depression Daughter     History   Social History  . Marital Status: Widowed    Spouse Name: N/A    Number of Children: 3  . Years of Education: N/A   Occupational History  . RECEPTIONIST     [employer]  .      Social History Main Topics  . Smoking status: Former Smoker    Types: Cigarettes  . Smokeless tobacco: Not on file  . Alcohol Use: No  . Drug Use: Not on file  . Sexually Active: Not on file   Other Topics Concern  . Not on file   Social History Narrative   Widowed 07/02.  Kids have moved out and now living with friend to share expenses.       Review of Systems    Constitutional: Negative for activity change and fatigue.       Walking a little more--still not much Wears seat belt Weight down 5# more  HENT: Negative for hearing loss, dental problem and tinnitus.   Eyes: Negative for visual disturbance.       No vision loss  Respiratory: Negative for cough and shortness of breath.   Cardiovascular: Negative for chest pain, palpitations and leg swelling.  Gastrointestinal: Negative for constipation, blood in stool and abdominal distention.       No sig heartburn  Genitourinary: Negative for dysuria, urgency and difficulty urinating.       Hormonal sweats are better Does get hot after AM meds Incontinence occ if sneezes or doesn't keep her bladder empty Self breast exam fine  Musculoskeletal: Positive for arthralgias. Negative for myalgias and joint swelling.       Chronic knee pain--aleve and fish oil  does help Now with some pain in hands also  Neurological: Negative  for dizziness, syncope, weakness, numbness and headaches.  Hematological: Negative for adenopathy. Does not bruise/bleed easily.  Psychiatric/Behavioral: Positive for sleep disturbance. Negative for dysphoric mood. The patient is not nervous/anxious.        Now sleeping well if she uses trazodone and ambien       Objective:   Physical Exam  Constitutional: She is oriented to person, place, and time. She appears well-developed and well-nourished. No distress.  HENT:  Right Ear: External ear normal.  Left Ear: External ear normal.  Mouth/Throat: Oropharynx is clear and moist. No oropharyngeal exudate.       TMs normal Full upper plate  Eyes: Conjunctivae are normal. Pupils are equal, round, and reactive to light. Right eye exhibits no discharge. Left eye exhibits no discharge.  Neck: Normal range of motion. Neck supple. No JVD present. No thyromegaly present.  Cardiovascular: Normal rate, regular rhythm and intact distal pulses.  Exam reveals no gallop.   Murmur heard.       Slight systolic murmur--not well localized  Pulmonary/Chest: Effort normal and breath sounds normal. No respiratory distress. She has no wheezes. She has no rales.  Abdominal: Soft. She exhibits no distension and no mass. There is no tenderness.  Genitourinary:       Breasts without mass or tenderness  Musculoskeletal: She exhibits no edema and no tenderness.       No effusions  Lymphadenopathy:    She has no cervical adenopathy.    She has no axillary adenopathy.  Neurological: She is alert and oriented to person, place, and time.       No sensory loss in feet No weakness  Skin: Skin is warm and dry. No rash noted. No erythema.  Psychiatric: She has a normal mood and affect. Judgment and thought content normal.          Assessment & Plan:

## 2010-09-19 NOTE — Patient Instructions (Addendum)
Please set up colonoscopy and mammogram Please note all the med changes---levemir, citalopram are decreased. Lisinopril stopped Please check your blood pressure about once a month and call if it is over 140/90

## 2010-09-26 NOTE — Letter (Signed)
Summary: New Patient letter  [facility]  [address] [place], [place] [address]   Phone: [phone]  Fax: [phone]       09/19/2010 MRN: [id number]  Anita Small [address] [place], [place]  [address]  [place]  Dear Ms. Clenney,  Welcome to the Gastroenterology Division at [facility].    You are scheduled to see Dr.  Claudette Head on Oct 27, 2010 at 3:45pm on the 3rd floor at [facility], [address]  We ask that you try to arrive at our office 15 minutes prior to your  appointment time to allow for check-in.  We would like you to complete the enclosed self-administered evaluation form prior to your visit and bring it with you on the day of your appointment.  We will review it with you.  Also, please bring a complete list of all your medications or, if you prefer, bring the medication bottles and we will list them.  Please bring your insurance card so that we may make a copy of it.  If your insurance requires a referral to see a specialist, please bring your referral form from your primary care physician.  Co-payments are due at the time of your visit and may be paid by cash, check or credit card.     Your office visit will consist of a consult with your physician (includes a physical exam), any laboratory testing he/she may order, scheduling of any necessary diagnostic testing (e.g. x-ray, ultrasound, CT-scan), and scheduling of a procedure (e.g. Endoscopy, Colonoscopy) if required.  Please allow enough time on your schedule to allow for any/all of these possibilities.    If you cannot keep your appointment, please call [phone] to cancel or reschedule prior to your appointment date.  This allows [place] the opportunity to schedule an appointment for another patient in need of care.  If you do not cancel or reschedule by 5 p.m. the business day prior to your appointment date, you will be charged a $50.00 late cancellation/no-show fee.    Thank you  for choosing [facility] for your medical needs.  We appreciate the opportunity to care for you.  Please visit [place] at our website  to learn more about our practice.                     Sincerely,                                                             The Gastroenterology Division

## 2010-10-06 ENCOUNTER — Ambulatory Visit: Payer: Self-pay | Admitting: Internal Medicine

## 2010-10-11 ENCOUNTER — Other Ambulatory Visit: Payer: Self-pay | Admitting: *Deleted

## 2010-10-11 MED ORDER — ZOLPIDEM TARTRATE 5 MG PO TABS: ORAL_TABLET | ORAL | Status: DC

## 2010-10-11 NOTE — Telephone Encounter (Signed)
rx faxed manually to pharmacy 

## 2010-10-11 NOTE — Telephone Encounter (Signed)
Okay #60 x 2 

## 2010-10-12 ENCOUNTER — Telehealth: Payer: Self-pay | Admitting: *Deleted

## 2010-10-12 NOTE — Telephone Encounter (Signed)
Okay to increase to 500mg  po tid Can send a new Rx Can take 500/1000 in AM and PM if that is easier Will recheck level at her next appt (was low when checked so should be okay to increase the dose)

## 2010-10-12 NOTE — Telephone Encounter (Signed)
Pt states she thinks she needs to increase her depakote dose, she is gritting her teeth again.  She takes one 500 mg tablet twice a day now.  Uses [facility].

## 2010-10-13 MED ORDER — DIVALPROEX SODIUM 500 MG PO DR TAB: 500.0000 mg | DELAYED_RELEASE_TABLET | Freq: Three times a day (TID) | ORAL | Status: DC

## 2010-10-13 NOTE — Telephone Encounter (Signed)
rx sent to pharmacy, spoke with patient and advised results 

## 2010-10-27 ENCOUNTER — Ambulatory Visit (INDEPENDENT_AMBULATORY_CARE_PROVIDER_SITE_OTHER): Payer: PRIVATE HEALTH INSURANCE | Admitting: Gastroenterology

## 2010-10-27 ENCOUNTER — Encounter: Payer: Self-pay | Admitting: Gastroenterology

## 2010-10-27 VITALS — BP 128/72 | HR 68 | Ht 62.0 in | Wt 245.0 lb

## 2010-10-27 DIAGNOSIS — Z860100 Personal history of colon polyps, unspecified: Secondary | ICD-10-CM

## 2010-10-27 DIAGNOSIS — Z9283 Personal history of failed moderate sedation: Secondary | ICD-10-CM

## 2010-10-27 MED ORDER — PEG-KCL-NACL-NASULF-NA ASC-C 100 G PO SOLR: 1.0000 | Freq: Once | ORAL | Status: AC

## 2010-10-27 NOTE — Progress Notes (Signed)
History of Present Illness: This is a 60 year old female with prior history of adenomatous colon polyps in 1999. A followup colonoscopy in November 2002 also revealed adenomatous colon polyps. Patient relates that she had significant pain after colonoscopy in 2002 and the sedation failed to adequately control her pain. Reviewing the procedure report, she received Versed 14 mg, fentanyl 150 mcg and droperidol 5 mg. She has no ongoing colorectal complaints and specifically denies change in bowel habits, change in stool caliber, melena, hematochezia, weight loss, abdominal pain, chest pain, nausea, vomiting, dysphagia, reflux symptoms. She has stable diabetes mellitus, hypertension, hyperlipidemia, hypothyroidism, obesity and depression.  Past Medical History  Diagnosis Date  . Depression     Paranoid tendencies  . Diabetes mellitus     Type II  . GERD (gastroesophageal reflux disease)   . Hypertension   . Anxiety   . Arthritis     Left knee  . Adenomatous polyp 1999  . Hyperlipidemia   . Hypothyroidism   . Obesity   . Diverticulosis    Past Surgical History  Procedure Date  . Heel spur surgery 03/03    Left  . Knee arthroscopy 2001    Left   Katrinka Blazing)  . Abdominal hysterectomy 1977    One ovary left  . Bladder repair 1977    Tacking  . Cervical spine surgery 1999  . Ct abd w & pelvis wo cm 2002    Negative. Pituitary normal on MRI  . Carotid endarterectomy 2/11    Dr Evette Cristal  . Cardiovascular stress test 7/00    negative. No cardiolite  . Transthoracic echocardiogram 3/04    normal  . Appendectomy   . Cholecystectomy     reports that she has quit smoking. Her smoking use included Cigarettes. She has never used smokeless tobacco. She reports that she does not drink alcohol or use illicit drugs. family history includes Cancer in her mother and paternal grandmother; Colon cancer in an unspecified family member; Colon polyps in her mother; Depression in her daughter; Diabetes in her  father and mother; Heart disease in her father and mother; and Hypertension in her sister. Allergies  Allergen Reactions  . Codeine Phosphate     REACTION: unspecified  . Duloxetine     REACTION: unspecified  . Lisinopril-Hydrochlorothiazide     REACTION: ?possible angioedema  . Meperidine Hcl     REACTION: unspecified  . Venlafaxine     REACTION: unspecified   Outpatient Encounter Prescriptions as of 10/27/2010  Medication Sig Dispense Refill  . buPROPion (WELLBUTRIN SR) 150 MG 12 hr tablet Take 150 mg by mouth 2 (two) times daily.        . cetirizine (ZYRTEC) 10 MG tablet Take 1/2 tablet by mouth once daily       . citalopram (CELEXA) 40 MG tablet Take 20 mg by mouth daily.       . Cyanocobalamin (VITAMIN B 12 PO) Take by mouth. Unsure mil gram. Takes two daily       . divalproex (DEPAKOTE) 500 MG EC tablet Take 1 tablet (500 mg total) by mouth 3 (three) times daily.  90 tablet  3  . estradiol (ESTRACE) 0.1 MG/GM vaginal cream Apply 1/4 tube three times per week       . fish oil-omega-3 fatty acids 1000 MG capsule Take 2 g by mouth daily.        . fluocinolone (SYNALAR) 0.025 % cream Apply topically 2 (two) times daily.        Marland Kitchen  glipiZIDE (GLUCOTROL) 5 MG tablet Take 5 mg by mouth 2 (two) times daily before a meal.        . glucose blood (RELION GLUCOSE TEST STRIPS) test strip 1 each. Use as instructed (3 times a day)       . HYDROcodone-acetaminophen (VICODIN) 5-500 MG per tablet Take 1 tablet by mouth every 4 (four) hours as needed.        . insulin detemir (LEVEMIR) 100 UNIT/ML injection Inject 50 Units into the skin daily. As directed      . levothyroxine (SYNTHROID, LEVOTHROID) 100 MCG tablet Take 100 mcg by mouth daily.        Marland Kitchen LORazepam (ATIVAN) 0.5 MG tablet Take 1 or 2 tablets by mouth 2 times a day as needed for nerves       . metFORMIN (GLUCOPHAGE) 500 MG tablet Take 500 mg by mouth 2 (two) times daily with meals.        . naproxen sodium (ANAPROX) 220 MG tablet Take 220 mg  by mouth 2 (two) times daily with meals.        . simvastatin (ZOCOR) 80 MG tablet Take 1/2 tablet by mouth at bedtime       . traZODone (DESYREL) 100 MG tablet Take 100 mg by mouth. 1/2 at bedtime      . vitamin E 200 UNIT capsule Take 200 Units by mouth 2 (two) times daily.        Marland Kitchen zolpidem (AMBIEN) 5 MG tablet Take 1 or 2 at bedtime as needed for sleep  60 tablet  2  . peg 3350 powder (MOVIPREP) 100 G SOLR Take 1 kit (100 g total) by mouth once.  1 kit  0   Review of Systems: Depression, fatigue, itching, increased urination. Pertinent positive and negative review of systems were noted in the above HPI section. All other review of systems were otherwise negative.  Physical Exam: General: Well developed , well nourished, no acute distress. Obese. Head: Normocephalic and atraumatic Eyes:  sclerae anicteric, EOMI Ears: Normal auditory acuity Mouth: No deformity or lesions Neck: Supple, no masses or thyromegaly Lungs: Clear throughout to auscultation Heart: Regular rate and rhythm; no murmurs, rubs or bruits Abdomen: Soft, non tender and non distended. No masses, hepatosplenomegaly or hernias noted. Normal Bowel sounds Rectal: Deferred to colonoscopy Musculoskeletal: Symmetrical with no gross deformities  Skin: No lesions on visible extremities Pulses:  Normal pulses noted Extremities: No clubbing, cyanosis, edema or deformities noted Neurological: Alert oriented x 4, grossly nonfocal Cervical Nodes:  No significant cervical adenopathy Inguinal Nodes: No significant inguinal adenopathy Psychological:  Alert and cooperative. Normal mood and affect  Assessment and Recommendations:  1. Personal history of adenomatous colons. Initial diagnosis 1999 subsequent colonoscopy 2002 also had adenomatous colon polyps. She is overdue for surveillance colonoscopy and she would like to proceed with colonoscopy. The risks, benefits, and alternatives to colonoscopy with possible biopsy and possible  polypectomy were discussed with the patient and they consent to proceed.   2. History of prior failed conscious sedation. Multiple comorbidities. Plan to proceed with propofol for moderate sedation. Patient is in agreement with this plan.

## 2010-10-27 NOTE — Patient Instructions (Addendum)
You have been scheduled for a Colonoscopy with propofol and separate instructions given. Your prep has been sent to your pharmacy.  cc: Tillman Abide, MD

## 2010-11-07 ENCOUNTER — Other Ambulatory Visit: Payer: Self-pay | Admitting: *Deleted

## 2010-11-07 MED ORDER — BUPROPION HCL ER (SR) 150 MG PO TB12: 150.0000 mg | ORAL_TABLET | Freq: Two times a day (BID) | ORAL | Status: DC

## 2010-11-07 MED ORDER — SIMVASTATIN 80 MG PO TABS: 80.0000 mg | ORAL_TABLET | Freq: Every day | ORAL | Status: DC

## 2010-11-07 NOTE — Assessment & Plan Note (Signed)
[facility] OFFICE NOTE   ANGLES, [name]                       MRN:          [id number]  DATE:12/04/2007                            DOB:          May 06, 1951    Anita [name] is a 60 year old widowed white female referred by Dr.  Tillman Abide to consult on the chest discomfort.   Over the last couple of weeks, she has had intermittent pressure with  some nausea in her left upper chest.  She denies any diaphoresis with  this, any vomiting.  It is not provoked by any particular activity  including eating or exertion.  She denies any true orthopnea, PND, or  peripheral edema.  She denies hemoptysis.   She has had no fever, chills, or productive cough.  She has no  palpitations or has no presyncope or syncope.   She has multiple cardiac risk factors for premature coronary disease and  vascular disease.  These include age, sex, obesity, sedentary lifestyle,  hyperlipidemia, hypertension, and diabetes.   She has had some problems with chest discomfort in the past and was  evaluated with a stress test, which was non-nuclear in 2000.  Apparently, she had catheterization by Dr. Juliann Pares about that time as  well.  These were both said to be negative, but I have no records.  A 2-  D echocardiogram in March 2004 apparently was normal.   PAST MEDICAL HISTORY:  She fortunately does not smoke.  She quit in  1990.  She does not use any recreational drugs, but puts August as her  last time of using them.  She does not drink.   PAST SURGICAL HISTORY:  She has had neck surgery in 1997, hysterectomy  in 1977, and knee replacement in 2000.   CURRENT MEDICATIONS:  1. Lisinopril/hydrochlorothiazide 10/12.5 daily.  2. Simvastatin 80 mg at bedtime.  3. Lantus 100 units at bedtime.  4. Metformin 500 mg p.o. b.i.d.  5. Celexa 60 mg a day.  6. Levoxyl 100 mg a day.  7. Glucotrol XL 10 mg per day.  8. NovoLog 30 units  t.i.d.  9. Vicodin 5/500, half daily.  10.Omeprazole 20 mg a day.  11.Ambien 10 mg at bedtime.  12.Fish oil 2 at bedtime.   She is intolerant to DEMEROL and CODEINE.   FAMILY HISTORY:  Negative for any premature coronary disease.   SOCIAL HISTORY:  She is a Scientist, physiological and works at hospice.  She is a  widow, has 3 children.   REVIEW OF SYSTEMS:  Other than her HPI, is positive for fatigue,  irritable bowel, arthritis, history of thyroid abnormalities, anxiety,  and depression.   PHYSICAL EXAMINATION:  GENERAL:  Today, she is very pleasant and  humorous.  VITAL SIGNS:  Her blood pressure 160/68, her pulse is 90 and regular.  She is 5 feet 2, weighs 300 pounds.  Her O2 sat is 96%.  GENERAL:  She is in no acute distress.  Her skin is slightly clammy.  HEENT:  Normocephalic, atraumatic.  PERRLA.  Extraocular movements  intact.  Sclerae are clear.  Facial symmetry is normal.  NECK:  Supple.  Carotid upstrokes were equal bilaterally with bilateral  referred sounds versus bruits.  Trachea is midline.  There is no  lymphadenopathy.  LUNGS:  Clear to auscultation.  She has a very thick chest.  PMI could  not be appreciated.  She has a normal S1.  She has a systolic murmur on  the left sternal border going up into the right upper sternal border.  S2 does split with inspiration.  No diastolic component was appreciated.  ABDOMEN:  Obese with good bowel sounds.  Organomegaly could not be  assessed.  EXTREMITIES:  Trace edema.  Pulses are 2+/4+ dorsalis pedis, 1+/4+  posterior tibial.  She has some varicose veins.  No sign of DVT.  NEURO:  Intact.  MUSCULOSKELETAL:  Chronic arthritic changes.   Her electrocardiogram on 11/06/2007 shows moderate criteria of LVH,  otherwise normal.   ASSESSMENT AND PLAN:  Anita [name]' symptoms could be atypical for  obstructive coronary disease.  She has a whole host of risk factors and  probably has some carotid disease as well as possibly some aortic   stenosis.   PLAN:  1. Carotid Dopplers.  2. A 2-D echocardiogram to assess the aortic valve.  3. Adenosine Myoview.  4. Secondary risk factor modification reinforced and emphasized at      length.  She is on a good program.   I will plan on seeing her back in about 3 weeks to answer questions  about these studies.  Hopefully, there will be no reason for any  invasive procedure at the present time.    Thomas C. Daleen Squibb, MD, [facility]  Electronically Signed   TCW/MedQ  DD: 12/04/2007  DT: 12/05/2007  Job #: [id number]   cc:   Karie Schwalbe, MD

## 2010-11-07 NOTE — Assessment & Plan Note (Signed)
[facility] OFFICE NOTE   [name], [name]                       MRN:          [id number]  DATE:12/30/2007                            DOB:          12-23-1950    [name] returns today to discuss the findings of her carotid  Dopplers, 2-D echocardiogram, and adenosine Myoview.  Please see my note  from December 04, 2007.   Her carotid Doppler shows 60%-79% plaque in the right internal carotid  artery and nonobstructive disease on the left.  She has antegrade flow  in both vertebrals.  She is asymptomatic.  Symptoms of stroke and TIAs  were reviewed and the importance of calling 911.   Her 2-D echocardiogram shows mild left ventricular hypertrophy with  normal left ventricular systolic function.  Her EF is 60%.  She has some  evidence of some mild diastolic dysfunction.  Her aortic valve is mildly  calcified, but there is no evidence of aortic valve stenosis or  regurgitation.  She has moderate left atrial dilatation.  These findings  were discussed with her and the importance of blood pressure control and  sugar control reinforced.   Her adenosine Myoview shows normal contractility and thickening of all  areas of myocardium with no ischemia.  Her EF is 65%.   She has some soft tissue attenuation affecting the anterior wall.  However, this is most likely breast attenuation with her body habitus  and size.   Her chest discomfort is better.  She is no longer on an H2 blocker or  PPI.  I have advised her to take fish oil in the morning to avoid  symptoms of reflux.   If she continues to have problems, she will let us know.  We talked  about the symptoms of an acute coronary syndrome and unstable angina and  how to respond 911.  All questions were answered.  I also advised her to  take 1 enteric-coated aspirin 81 mg a day with her diabetes and carotid  disease.  We will see her back in a year for carotid  scanning.     Thomas C. Daleen Squibb, MD, [facility]  Electronically Signed    TCW/MedQ  DD: 12/30/2007  DT: 12/30/2007  Job #: [id number]   cc:   Karie Schwalbe, MD

## 2010-11-21 ENCOUNTER — Encounter: Payer: Self-pay | Admitting: Gastroenterology

## 2010-11-21 ENCOUNTER — Ambulatory Visit (AMBULATORY_SURGERY_CENTER): Payer: PRIVATE HEALTH INSURANCE | Admitting: Gastroenterology

## 2010-11-21 DIAGNOSIS — Z86010 Personal history of colonic polyps: Secondary | ICD-10-CM

## 2010-11-21 DIAGNOSIS — K5730 Diverticulosis of large intestine without perforation or abscess without bleeding: Secondary | ICD-10-CM

## 2010-11-21 DIAGNOSIS — Z1211 Encounter for screening for malignant neoplasm of colon: Secondary | ICD-10-CM

## 2010-11-21 LAB — GLUCOSE, CAPILLARY: Glucose-Capillary: 122 mg/dL — ABNORMAL HIGH (ref 70–99)

## 2010-11-21 MED ORDER — SODIUM CHLORIDE 0.9 % IV SOLN: 500.0000 mL | INTRAVENOUS | Status: DC

## 2010-11-21 NOTE — Patient Instructions (Signed)
Follow discharge instructions.  Continue your medications.  High fiber diet.  Next Colonoscopy in 5 years.

## 2010-11-22 ENCOUNTER — Telehealth: Payer: Self-pay

## 2010-11-22 NOTE — Telephone Encounter (Signed)
There was no ID on the home # or cell #.  No message left. MAW

## 2010-12-08 ENCOUNTER — Encounter: Payer: Self-pay | Admitting: Family Medicine

## 2010-12-08 ENCOUNTER — Ambulatory Visit (INDEPENDENT_AMBULATORY_CARE_PROVIDER_SITE_OTHER): Payer: PRIVATE HEALTH INSURANCE | Admitting: Family Medicine

## 2010-12-08 VITALS — BP 168/64 | HR 64 | Temp 98.7°F | Wt 241.0 lb

## 2010-12-08 DIAGNOSIS — R350 Frequency of micturition: Secondary | ICD-10-CM

## 2010-12-08 DIAGNOSIS — N390 Urinary tract infection, site not specified: Secondary | ICD-10-CM | POA: Insufficient documentation

## 2010-12-08 LAB — POCT URINALYSIS DIPSTICK
Glucose, UA: NEGATIVE
Leukocytes, UA: NEGATIVE
Nitrite, UA: NEGATIVE
Urobilinogen, UA: 0.2

## 2010-12-08 MED ORDER — SULFAMETHOXAZOLE-TRIMETHOPRIM 800-160 MG PO TABS: 1.0000 | ORAL_TABLET | Freq: Two times a day (BID) | ORAL | Status: DC

## 2010-12-08 NOTE — Patient Instructions (Signed)
Start the antibiotics today and we'll contact you with your lab report.  Drink plenty of fluids and let us know if you aren't improving (abdominal pain, tremor, tick bite).

## 2010-12-08 NOTE — Progress Notes (Signed)
Several times this week and last week she had frequency "every hour on the hour."  Dysuria: no except as above.  No burning.  duration of symptoms: 2 weeks abdominal pain: lower abd pain that is episodic over last 2 weeks, "like cramps but w/o the period."  Fevers:no but felt hot back pain: lower back but no CVA pain Vomiting:no but did have some nausea other concerns:no No discharge.   She's had a tremor in hands.  This is intermittent and she'll monitor this. No tremor today.    Sugar has been controlled. She had a tick bite, it was removed at ~1 day.    Meds, vitals, and allergies reviewed.   ROS: See HPI.  Otherwise negative.    GEN: nad, alert and oriented, obese HEENT: mucous membranes moist NECK: supple CV: rrr.  PULM: ctab, no inc wob ABD: soft, +bs, suprapubic area not tender EXT: no edema SKIN: no acute rash but mild irritation on tick bite site on mid back.   BACK: no CVA pain

## 2010-12-08 NOTE — Assessment & Plan Note (Addendum)
Presumed, with possible bladder spasm.  Check ucx, start septra and f/u prn.  D/w pt and she agrees.  Also she'll monitor the tick bite site and will f/u if progressively red, enlarging, or other new symptoms.  This doesn't appear to need abx treatment now.  I asked her to f/u with Dr. Alphonsus Sias about the tremor, should it continue.  No tremor on exam today.   I specifically asked about sulfa allergy.  She denied sulfa allergy and said she had taken septra before w/o problems.  It appears the possible allergy listed with ACE/HCTZ is potentially due to ACE, and likely not the HCTZ.  D/w pt, she understood and agreed to proceed.

## 2010-12-10 LAB — URINE CULTURE

## 2010-12-13 ENCOUNTER — Telehealth: Payer: Self-pay | Admitting: *Deleted

## 2010-12-13 NOTE — Telephone Encounter (Signed)
Okay to send Rx for septra DS bid  #14 x 0  To extend the antibiotic course  I am concerned about stopping the depakote since it did help her mood If she is still taking 500mg  tid, have her decrease to twice a day and we will discuss it further at her next visit and try to wean further if she is doing okay still

## 2010-12-13 NOTE — Telephone Encounter (Signed)
Also was seen last week by Dr. Para March for a UTI and is still having all of the same symptoms. She says that she feels that she may need more than 3 days of the septra. Please advise. Uses [facility].

## 2010-12-13 NOTE — Telephone Encounter (Signed)
Patient says that she no longer wants to take the depakote. She says that since taking it she has had blurred vision, headache, feel shaky,and can't remember anything. She says that she is willing to try something different if you have any recommendation. [facility]ses [facility] .

## 2010-12-14 MED ORDER — SULFAMETHOXAZOLE-TRIMETHOPRIM 800-160 MG PO TABS: 1.0000 | ORAL_TABLET | Freq: Two times a day (BID) | ORAL | Status: AC

## 2010-12-14 NOTE — Telephone Encounter (Signed)
Spoke with patient and advised results, rx sent to pharmacy by e-script  

## 2010-12-18 ENCOUNTER — Other Ambulatory Visit: Payer: Self-pay | Admitting: *Deleted

## 2010-12-18 MED ORDER — METFORMIN HCL 500 MG PO TABS: 500.0000 mg | ORAL_TABLET | Freq: Two times a day (BID) | ORAL | Status: DC

## 2010-12-18 MED ORDER — TRAZODONE HCL 100 MG PO TABS: 100.0000 mg | ORAL_TABLET | Freq: Every day | ORAL | Status: DC

## 2010-12-18 NOTE — Telephone Encounter (Signed)
Fax is on your desk . 

## 2010-12-18 NOTE — Telephone Encounter (Signed)
Please confirm the dose Different in chart than on fax Okay to refill for 1 year (electronic)

## 2010-12-18 NOTE — Telephone Encounter (Signed)
rx sent to pharmacy by e-script  

## 2010-12-28 ENCOUNTER — Other Ambulatory Visit: Payer: Self-pay | Admitting: *Deleted

## 2010-12-28 MED ORDER — ESTRADIOL 0.1 MG/GM VA CREA: TOPICAL_CREAM | VAGINAL | Status: DC

## 2010-12-28 NOTE — Telephone Encounter (Signed)
rx sent to pharmacy by e-script  

## 2011-01-05 ENCOUNTER — Other Ambulatory Visit: Payer: Self-pay | Admitting: *Deleted

## 2011-01-05 MED ORDER — "INSULIN SYRINGE-NEEDLE U-100 31G X 5/16"" 1 ML MISC": Status: DC

## 2011-01-23 ENCOUNTER — Other Ambulatory Visit: Payer: Self-pay | Admitting: *Deleted

## 2011-01-23 MED ORDER — GLIPIZIDE 5 MG PO TABS: 5.0000 mg | ORAL_TABLET | Freq: Two times a day (BID) | ORAL | Status: DC

## 2011-01-23 MED ORDER — LEVOTHYROXINE SODIUM 100 MCG PO TABS: 100.0000 ug | ORAL_TABLET | Freq: Every day | ORAL | Status: DC

## 2011-02-05 ENCOUNTER — Ambulatory Visit (INDEPENDENT_AMBULATORY_CARE_PROVIDER_SITE_OTHER)
Admission: RE | Admit: 2011-02-05 | Discharge: 2011-02-05 | Disposition: A | Discharge status: Home / Self Care | Payer: PRIVATE HEALTH INSURANCE | Source: Ambulatory Visit | Attending: Family Medicine | Admitting: Family Medicine

## 2011-02-05 ENCOUNTER — Encounter: Payer: Self-pay | Admitting: Family Medicine

## 2011-02-05 ENCOUNTER — Ambulatory Visit (INDEPENDENT_AMBULATORY_CARE_PROVIDER_SITE_OTHER): Payer: PRIVATE HEALTH INSURANCE | Admitting: Family Medicine

## 2011-02-05 DIAGNOSIS — R209 Unspecified disturbances of skin sensation: Secondary | ICD-10-CM

## 2011-02-05 DIAGNOSIS — M5412 Radiculopathy, cervical region: Secondary | ICD-10-CM | POA: Insufficient documentation

## 2011-02-05 DIAGNOSIS — R202 Paresthesia of skin: Secondary | ICD-10-CM

## 2011-02-05 NOTE — Progress Notes (Signed)
  Subjective:    Patient ID: Anita Small, female    DOB: Apr 16, 1951, 60 y.o.   MRN: [id number]  HPI  Anita Small, a 60 y.o. female presents today in the office for the following:    Ten to twelve years ago, had to replace the "bone that fell apart in her neck". After images have returned, it appears like she had a c4-5 fusion. Unclear who was the operating surgeon, but done in [place]. Now, having radiculopathy that will last less than a minute. Tried a carpal tunnel brace and it did not help. Can induce with certain motions at the neck. She is mostly worried about breakdown of prior operative intervention.   No weakness or wasting.  The PMH, PSH, Social History, Family History, Medications, and allergies have been reviewed in [facility], and have been updated if relevant.  Review of Systems REVIEW OF SYSTEMS  GEN: No fevers, chills. Nontoxic. Primarily MSK c/o today. MSK: Detailed in the HPI GI: tolerating PO intake without difficulty Neuro: detailed above Otherwise the pertinent positives of the ROS are noted above.     Objective:   Physical Exam   Physical Exam  Blood pressure 140/82, pulse 84, temperature 98.5 F (36.9 C), temperature source Oral, height 5\' 2"  (1.575 m), weight 245 lb 1.9 oz (111.186 kg), SpO2 98.00%.  GEN: Well-developed,well-nourished,in no acute distress; alert,appropriate and cooperative throughout examination HEENT: Normocephalic and atraumatic without obvious abnormalities. Ears, externally no deformities PULM: Breathing comfortably in no respiratory distress EXT: No clubbing, cyanosis, or edema PSYCH: Normally interactive. Cooperative during the interview. Pleasant. Friendly and conversant. Not anxious or depressed appearing. Normal, full affect.  CERVICAL SPINE EXAM Range of motion: Flexion, extension, lateral bending, and rotation: moderate restriction in all planes Pain with terminal motion: none Spinous Processes, SCM, Upper paracervical muscles:  minimally ttp Upper traps: NT C5-T1 intact, sensation and motor spurling's neg  Tinnel's of hand and elbow negative phalen's neg      Assessment & Plan:   1. Cervical radiculopathy  Vitamin B12, Vitamin D 25 hydroxy, Basic metabolic panel, DG Cervical Spine Complete  2. Tingling in extremities  Vitamin B12, Vitamin D 25 hydroxy, Basic metabolic panel, DG Cervical Spine Complete   Most likely from cervical spine.  C-spine films show advanced DDD. Fusion at c4-5 intact. We will also check b12 and D.  I asked her to take a b-complex - given b6 supplements can sometimes help with these sensations. She is not interested in being aggressive at all, which i think is reasonable.   With all of her psychoactive meds, I think neurontin may be reasonable to try if symptoms persist, but i would avoid TCA's and the other newer antidepressants also used for pain and nerve involvement.

## 2011-02-06 LAB — BASIC METABOLIC PANEL
BUN: 18 mg/dL (ref 6–23)
Chloride: 104 mEq/L (ref 96–112)
GFR: 71.09 mL/min (ref >60.00)
Glucose, Bld: 118 mg/dL — ABNORMAL HIGH (ref 70–99)
Potassium: 4.7 mEq/L (ref 3.5–5.1)
Sodium: 142 mEq/L (ref 135–145)

## 2011-02-07 ENCOUNTER — Telehealth: Payer: Self-pay | Admitting: Internal Medicine

## 2011-02-09 ENCOUNTER — Other Ambulatory Visit: Payer: Self-pay | Admitting: *Deleted

## 2011-02-11 NOTE — Telephone Encounter (Signed)
Okay #30 x 0 

## 2011-02-12 MED ORDER — HYDROCODONE-ACETAMINOPHEN 5-500 MG PO TABS: 1.0000 | ORAL_TABLET | ORAL | Status: DC | PRN

## 2011-02-12 NOTE — Telephone Encounter (Signed)
Rx called to pharmacy as instructed. 

## 2011-03-23 ENCOUNTER — Encounter: Payer: Self-pay | Admitting: Internal Medicine

## 2011-03-23 ENCOUNTER — Ambulatory Visit (INDEPENDENT_AMBULATORY_CARE_PROVIDER_SITE_OTHER): Payer: PRIVATE HEALTH INSURANCE | Admitting: Internal Medicine

## 2011-03-23 VITALS — BP 163/85 | HR 80 | Temp 98.2°F | Ht 62.0 in | Wt 250.0 lb

## 2011-03-23 DIAGNOSIS — I10 Essential (primary) hypertension: Secondary | ICD-10-CM

## 2011-03-23 DIAGNOSIS — E1149 Type 2 diabetes mellitus with other diabetic neurological complication: Secondary | ICD-10-CM

## 2011-03-23 DIAGNOSIS — F329 Major depressive disorder, single episode, unspecified: Secondary | ICD-10-CM

## 2011-03-23 DIAGNOSIS — Z23 Encounter for immunization: Secondary | ICD-10-CM

## 2011-03-23 LAB — HEMOGLOBIN A1C
Hgb A1c MFr Bld: 7.7 % — ABNORMAL HIGH (ref <5.7)
Mean Plasma Glucose: 174 mg/dL — ABNORMAL HIGH (ref <117)

## 2011-03-23 NOTE — Assessment & Plan Note (Signed)
BP Readings from Last 3 Encounters:  03/23/11 163/85  02/05/11 140/82  12/08/10 168/64   Up again today Had been okay last time May be related to stress with daughter Will not add meds but recheck in 3months instead of 6

## 2011-03-23 NOTE — Assessment & Plan Note (Signed)
Still seems to have good control Will check labs Needs to resume her low carb diet that has done so well for her

## 2011-03-23 NOTE — Patient Instructions (Signed)
Please set up referral to Dr Ernest Pine

## 2011-03-23 NOTE — Progress Notes (Signed)
Subjective:    Patient ID: Anita Small, female    DOB: Jan 16, 1951, 60 y.o.   MRN: [id number]  HPI Had a hard month last month Wanted to eat carbs Otherwise sticks with the Atkin's diet Gained back 5#  Still having the hand numbness Did just retire today---?repetitive motion at work part of the problem  Hasn't been checking BP No headaches No chest pain Some DOE but nothing new No sig edema  Checks sugars a couple of times a week occ low readings but no sig symptoms No bad elevations unless she doesn't stick to diet  Daughter who lives with her tried to commit suicide Now in institution Very stressful Feels this may be why her BP is up now  She feels her mood is good---and expects things to be better with retirement Just stress with daughter  Left knee is worse Wants to consider getting TKR  Current Outpatient Prescriptions on File Prior to Visit  Medication Sig Dispense Refill  . buPROPion (WELLBUTRIN SR) 150 MG 12 hr tablet Take 1 tablet (150 mg total) by mouth 2 (two) times daily.  180 tablet  1  . cetirizine (ZYRTEC) 10 MG tablet Take 1/2 tablet by mouth once daily       . citalopram (CELEXA) 40 MG tablet Take 20 mg by mouth daily.       . Cyanocobalamin (VITAMIN B 12 PO) Take by mouth. Unsure mil gram. Takes two daily       . divalproex (DEPAKOTE) 500 MG EC tablet Take 1 tablet (500 mg total) by mouth 3 (three) times daily.  90 tablet  3  . estradiol (ESTRACE) 0.1 MG/GM vaginal cream Apply 1/4  Applicator full two times per week  42.5 g  1  . fish oil-omega-3 fatty acids 1000 MG capsule Take 2 g by mouth daily.        . fluocinolone (SYNALAR) 0.025 % cream Apply topically 2 (two) times daily.        Marland Kitchen glipiZIDE (GLUCOTROL) 5 MG tablet Take 1 tablet (5 mg total) by mouth 2 (two) times daily before a meal.  180 tablet  3  . glucose blood (RELION GLUCOSE TEST STRIPS) test strip 1 each. Use as instructed (3 times a day)       . HYDROcodone-acetaminophen (VICODIN)  5-500 MG per tablet Take 1 tablet by mouth every 4 (four) hours as needed.  30 tablet  0  . insulin detemir (LEVEMIR) 100 UNIT/ML injection Inject 50 Units into the skin daily. As directed      . Insulin Syringe-Needle U-100 (ULTICARE INSULIN SYRINGE) 31G X 5/16" 1 ML MISC Use as directed.  100 each  3  . levothyroxine (SYNTHROID, LEVOTHROID) 100 MCG tablet Take 1 tablet (100 mcg total) by mouth daily.  90 tablet  3  . LORazepam (ATIVAN) 0.5 MG tablet Take 1 or 2 tablets by mouth 2 times a day as needed for nerves       . metFORMIN (GLUCOPHAGE) 500 MG tablet Take 1 tablet (500 mg total) by mouth 2 (two) times daily with a meal.  180 tablet  3  . naproxen sodium (ANAPROX) 220 MG tablet Take 220 mg by mouth 2 (two) times daily with meals.        . simvastatin (ZOCOR) 80 MG tablet Take 1 tablet (80 mg total) by mouth daily. Take 1/2 tablet by mouth at bedtime  90 tablet  1  . traZODone (DESYREL) 100 MG tablet Take 1 tablet (100 mg  total) by mouth at bedtime.  30 tablet  11  . vitamin E 200 UNIT capsule Take 200 Units by mouth 2 (two) times daily.        Marland Kitchen zolpidem (AMBIEN) 5 MG tablet Take 1 or 2 at bedtime as needed for sleep  60 tablet  2   Current Facility-Administered Medications on File Prior to Visit  Medication Dose Route Frequency Provider Last Rate Last Dose  . 0.9 %  sodium chloride infusion  500 mL Intravenous Continuous Meryl Dare, MD,FACG        Allergies  Allergen Reactions  . Codeine Phosphate     REACTION: unspecified  . Duloxetine     REACTION: unspecified  . Lisinopril-Hydrochlorothiazide     REACTION: ?possible angioedema  . Meperidine Hcl     REACTION: unspecified  . Venlafaxine     REACTION: unspecified    Past Medical History  Diagnosis Date  . Depression     Paranoid tendencies  . Diabetes mellitus     Type II  . GERD (gastroesophageal reflux disease)   . Hypertension   . Anxiety   . Arthritis     Left knee  . Adenomatous polyp 1999  .  Hyperlipidemia   . Hypothyroidism   . Obesity   . Diverticulosis     Past Surgical History  Procedure Date  . Heel spur surgery 03/03    Left  . Knee arthroscopy 2001    Left   Katrinka Blazing)  . Abdominal hysterectomy 1977    One ovary left  . Bladder repair 1977    Tacking  . Cervical spine surgery 1999  . Ct abd w & pelvis wo cm 2002    Negative. Pituitary normal on MRI  . Carotid endarterectomy 2/11    Dr Evette Cristal  . Cardiovascular stress test 7/00    negative. No cardiolite  . Transthoracic echocardiogram 3/04    normal  . Appendectomy   . Cholecystectomy     Family History  Problem Relation Age of Onset  . Cancer Mother     Breast  . Heart disease Mother   . Diabetes Mother   . Heart disease Father   . Diabetes Father   . Hypertension Sister   . Cancer Paternal Grandmother     ? throat CA  . Depression Daughter   . Colon cancer      Unsure if mother had it   . Colon polyps Mother     History   Social History  . Marital Status: Widowed    Spouse Name: N/A    Number of Children: 3  . Years of Education: N/A   Occupational History  . RECEPTIONIST     [employer]  .      Social History Main Topics  . Smoking status: Former Smoker    Types: Cigarettes  . Smokeless tobacco: Never Used  . Alcohol Use: No  . Drug Use: No  . Sexually Active: Not on file   Other Topics Concern  . Not on file   Social History Narrative   Widowed 07/02.  Kids have moved out and now living with friend to share expenses.3 caffeine drinks daily    Review of Systems Sleeps really well--discussed trying to limit zolpidem use     Objective:   Physical Exam  Constitutional: She appears well-developed and well-nourished. No distress.  Neck: Normal range of motion. Neck supple.  Cardiovascular: Normal rate, regular rhythm and intact distal pulses.  Soft aortic systolic murmur  Pulmonary/Chest: Effort normal and breath sounds normal. No respiratory distress. She  has no wheezes. She has no rales.  Musculoskeletal: She exhibits no edema.  Lymphadenopathy:    She has no cervical adenopathy.  Psychiatric: She has a normal mood and affect. Her behavior is normal. Judgment and thought content normal.          Assessment & Plan:

## 2011-03-23 NOTE — Assessment & Plan Note (Signed)
Despite stress, her mood has been good Will consider weaning meds if retirement really agrees with her

## 2011-03-23 NOTE — Assessment & Plan Note (Signed)
Worse Will set up with Dr Ernest Pine

## 2011-04-05 ENCOUNTER — Other Ambulatory Visit: Payer: Self-pay | Admitting: *Deleted

## 2011-04-05 MED ORDER — ZOLPIDEM TARTRATE 5 MG PO TABS: ORAL_TABLET | ORAL | Status: DC

## 2011-04-05 NOTE — Telephone Encounter (Signed)
Okay #60 x 2 

## 2011-04-05 NOTE — Telephone Encounter (Signed)
rx faxed to pharmacy manually  

## 2011-04-05 NOTE — Telephone Encounter (Signed)
Form on your desk  

## 2011-04-13 ENCOUNTER — Other Ambulatory Visit: Payer: Self-pay | Admitting: *Deleted

## 2011-04-13 MED ORDER — HYDROCODONE-ACETAMINOPHEN 5-500 MG PO TABS: 1.0000 | ORAL_TABLET | ORAL | Status: DC | PRN

## 2011-04-13 NOTE — Telephone Encounter (Signed)
Medication phoned to pharmacy.  

## 2011-04-13 NOTE — Telephone Encounter (Signed)
Okay #30 x 0 

## 2011-04-16 NOTE — Telephone Encounter (Signed)
error 

## 2011-04-17 ENCOUNTER — Other Ambulatory Visit: Payer: Self-pay | Admitting: *Deleted

## 2011-04-17 MED ORDER — DIVALPROEX SODIUM 500 MG PO DR TAB: 500.0000 mg | DELAYED_RELEASE_TABLET | Freq: Three times a day (TID) | ORAL | Status: DC

## 2011-04-26 HISTORY — PX: TOTAL KNEE ARTHROPLASTY: SHX125

## 2011-05-07 ENCOUNTER — Ambulatory Visit: Payer: Self-pay | Admitting: Specialist

## 2011-05-07 ENCOUNTER — Ambulatory Visit: Payer: PRIVATE HEALTH INSURANCE | Admitting: Internal Medicine

## 2011-05-08 ENCOUNTER — Encounter: Payer: Self-pay | Admitting: Internal Medicine

## 2011-05-08 ENCOUNTER — Ambulatory Visit (INDEPENDENT_AMBULATORY_CARE_PROVIDER_SITE_OTHER): Payer: PRIVATE HEALTH INSURANCE | Admitting: Internal Medicine

## 2011-05-08 DIAGNOSIS — E1149 Type 2 diabetes mellitus with other diabetic neurological complication: Secondary | ICD-10-CM

## 2011-05-08 DIAGNOSIS — I10 Essential (primary) hypertension: Secondary | ICD-10-CM

## 2011-05-08 MED ORDER — LISINOPRIL-HYDROCHLOROTHIAZIDE 10-12.5 MG PO TABS: 1.0000 | ORAL_TABLET | Freq: Every day | ORAL | Status: DC

## 2011-05-08 NOTE — Progress Notes (Signed)
Subjective:    Patient ID: Anita Small, female    DOB: 1950/09/04, 60 y.o.   MRN: [id number]  HPI Having left total knee replacement Nov 28th Dr Reita Chard  Still on At[place]'s diet Has been doing well Activity limited by knee  No chest pain No palpitations  No SOB unless she tries to walk without cane--no change in exercise tolerance. Relates this dyspnea to knee pain Slight ankle edema only  No cough or fever  Checking sugars regularly Still doing well with this  Current Outpatient Prescriptions on File Prior to Visit  Medication Sig Dispense Refill  . buPROPion (WELLBUTRIN SR) 150 MG 12 hr tablet Take 1 tablet (150 mg total) by mouth 2 (two) times daily.  180 tablet  1  . cetirizine (ZYRTEC) 10 MG tablet Take 1/2 tablet by mouth once daily       . citalopram (CELEXA) 40 MG tablet Take 20 mg by mouth daily.       . Cyanocobalamin (VITAMIN B 12 PO) Take by mouth. Unsure mil gram. Takes two daily       . divalproex (DEPAKOTE) 500 MG DR tablet Take 1 tablet (500 mg total) by mouth 3 (three) times daily.  90 tablet  3  . estradiol (ESTRACE) 0.1 MG/GM vaginal cream Apply 1/4  Applicator full two times per week  42.5 g  1  . fish oil-omega-3 fatty acids 1000 MG capsule Take 2 g by mouth daily.        . fluocinolone (SYNALAR) 0.025 % cream Apply topically 2 (two) times daily.        Marland Kitchen glipiZIDE (GLUCOTROL) 5 MG tablet Take 1 tablet (5 mg total) by mouth 2 (two) times daily before a meal.  180 tablet  3  . glucose blood (RELION GLUCOSE TEST STRIPS) test strip 1 each. Use as instructed (3 times a day)       . HYDROcodone-acetaminophen (VICODIN) 5-500 MG per tablet Take 1 tablet by mouth every 4 (four) hours as needed.  30 tablet  0  . insulin detemir (LEVEMIR) 100 UNIT/ML injection Inject 50 Units into the skin daily. As directed      . Insulin Syringe-Needle U-100 (ULTICARE INSULIN SYRINGE) 31G X 5/16" 1 ML MISC Use as directed.  100 each  3  . levothyroxine (SYNTHROID, LEVOTHROID)  100 MCG tablet Take 1 tablet (100 mcg total) by mouth daily.  90 tablet  3  . LORazepam (ATIVAN) 0.5 MG tablet Take 1 or 2 tablets by mouth 2 times a day as needed for nerves       . metFORMIN (GLUCOPHAGE) 500 MG tablet Take 1 tablet (500 mg total) by mouth 2 (two) times daily with a meal.  180 tablet  3  . naproxen sodium (ANAPROX) 220 MG tablet Take 220 mg by mouth 2 (two) times daily with meals.        . simvastatin (ZOCOR) 80 MG tablet Take 1 tablet (80 mg total) by mouth daily. Take 1/2 tablet by mouth at bedtime  90 tablet  1  . traZODone (DESYREL) 100 MG tablet Take 1 tablet (100 mg total) by mouth at bedtime.  30 tablet  11  . vitamin E 200 UNIT capsule Take 200 Units by mouth 2 (two) times daily.         Current Facility-Administered Medications on File Prior to Visit  Medication Dose Route Frequency Provider Last Rate Last Dose  . 0.9 %  sodium chloride infusion  500 mL Intravenous Continuous  Eliezer Bottom., MD,FACG        Allergies  Allergen Reactions  . Codeine Phosphate     REACTION: unspecified  . Duloxetine     REACTION: unspecified  . Lisinopril-Hydrochlorothiazide     REACTION: ?possible angioedema  . Meperidine Hcl     REACTION: unspecified  . Venlafaxine     REACTION: unspecified    Past Medical History  Diagnosis Date  . Depression     Paranoid tendencies  . Diabetes mellitus     Type II  . GERD (gastroesophageal reflux disease)   . Hypertension   . Anxiety   . Arthritis     Left knee  . Adenomatous polyp 1999  . Hyperlipidemia   . Hypothyroidism   . Obesity   . Diverticulosis     Past Surgical History  Procedure Date  . Heel spur surgery 03/03    Left  . Knee arthroscopy 2001    Left   Katrinka Blazing)  . Abdominal hysterectomy 1977    One ovary left  . Bladder repair 1977    Tacking  . Cervical spine surgery 1999  . Ct abd w & pelvis wo cm 2002    Negative. Pituitary normal on MRI  . Carotid endarterectomy 2/11    Dr Evette Cristal  .  Cardiovascular stress test 7/00    negative. No cardiolite  . Transthoracic echocardiogram 3/04    normal  . Appendectomy   . Cholecystectomy     Family History  Problem Relation Age of Onset  . Cancer Mother     Breast  . Heart disease Mother   . Diabetes Mother   . Heart disease Father   . Diabetes Father   . Hypertension Sister   . Cancer Paternal Grandmother     ? throat CA  . Depression Daughter   . Colon cancer      Unsure if mother had it   . Colon polyps Mother     History   Social History  . Marital Status: Widowed    Spouse Name: N/A    Number of Children: 3  . Years of Education: N/A   Occupational History  . 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     Marland Kitchen      Social History Main Topics  . Smoking status: Former Smoker    Types: Cigarettes  . Smokeless tobacco: Never Used  . Alcohol Use: No  . Drug Use: No  . Sexually Active: Not on file   Other Topics Concern  . Not on file   Social History Narrative   Widowed 07/02.  Kids have moved out and now living with friend to share expenses.3 caffeine drinks daily    Review of Systems Weight is up 3# since last visit---still down 40-50# since 1.5 years ago Sleeps okay--stopped the zolpidem    Objective:   Physical Exam  Constitutional: She appears well-developed and well-nourished. No distress.  Neck: Normal range of motion. Neck supple. No thyromegaly present.       Healed endarterectomy scar on right  Cardiovascular: Normal rate, regular rhythm and normal heart sounds.  Exam reveals no gallop.   No murmur heard. Pulmonary/Chest: Effort normal and breath sounds normal. No respiratory distress. She has no wheezes. She has no rales.  Musculoskeletal: She exhibits no edema.  Lymphadenopathy:    She has no cervical adenopathy.  Psychiatric: She has a normal mood and affect. Her behavior is normal. Judgment and thought  content normal.          Assessment & Plan:

## 2011-05-08 NOTE — Assessment & Plan Note (Signed)
Due for surgery November 28th No acute cardiorespiratory symptoms Has history of vascular disease with CEA but no known cardiac disease EKG shows sinus rhythm with several PVCs. Nonspecific ST changes in precordial leads that are not changed from 5/09 No contraindications to proposed TKR Should have usual antithrombotic Rx

## 2011-05-08 NOTE — Assessment & Plan Note (Signed)
Has had reasonable control of late Lab Results  Component Value Date   HGBA1C 7.7* 03/23/2011

## 2011-05-08 NOTE — Assessment & Plan Note (Signed)
BP Readings from Last 3 Encounters:  05/08/11 160/80  03/23/11 163/85  02/05/11 140/82   Persistently high Will restart lisinopril/HCTZ--though starting at lower dose

## 2011-05-10 ENCOUNTER — Other Ambulatory Visit: Payer: Self-pay | Admitting: *Deleted

## 2011-05-10 MED ORDER — BUPROPION HCL ER (SR) 150 MG PO TB12: 150.0000 mg | ORAL_TABLET | Freq: Two times a day (BID) | ORAL | Status: DC

## 2011-05-11 ENCOUNTER — Other Ambulatory Visit: Payer: Self-pay | Admitting: *Deleted

## 2011-05-12 MED ORDER — CITALOPRAM HYDROBROMIDE 40 MG PO TABS: 20.0000 mg | ORAL_TABLET | Freq: Every day | ORAL | Status: DC

## 2011-05-12 NOTE — Telephone Encounter (Signed)
Rx sent electronically.  

## 2011-05-23 ENCOUNTER — Inpatient Hospital Stay: Payer: Self-pay | Admitting: Specialist

## 2011-05-24 ENCOUNTER — Encounter: Payer: Self-pay | Admitting: Internal Medicine

## 2011-06-22 ENCOUNTER — Ambulatory Visit (INDEPENDENT_AMBULATORY_CARE_PROVIDER_SITE_OTHER): Payer: PRIVATE HEALTH INSURANCE | Admitting: Internal Medicine

## 2011-06-22 ENCOUNTER — Ambulatory Visit (INDEPENDENT_AMBULATORY_CARE_PROVIDER_SITE_OTHER)
Admission: RE | Admit: 2011-06-22 | Discharge: 2011-06-22 | Disposition: A | Discharge status: Home / Self Care | Payer: PRIVATE HEALTH INSURANCE | Source: Ambulatory Visit | Attending: Internal Medicine | Admitting: Internal Medicine

## 2011-06-22 ENCOUNTER — Encounter: Payer: Self-pay | Admitting: Internal Medicine

## 2011-06-22 VITALS — BP 148/80 | HR 79 | Temp 98.9°F | Resp 20 | Ht 62.0 in | Wt 237.0 lb

## 2011-06-22 DIAGNOSIS — R05 Cough: Secondary | ICD-10-CM

## 2011-06-22 DIAGNOSIS — J45909 Unspecified asthma, uncomplicated: Secondary | ICD-10-CM | POA: Insufficient documentation

## 2011-06-22 MED ORDER — HYDROCODONE-HOMATROPINE 5-1.5 MG PO TABS: 1.0000 | ORAL_TABLET | Freq: Every evening | ORAL | Status: DC | PRN

## 2011-06-22 MED ORDER — PREDNISONE 20 MG PO TABS: 40.0000 mg | ORAL_TABLET | Freq: Every day | ORAL | Status: AC

## 2011-06-22 MED ORDER — LEVOFLOXACIN 500 MG PO TABS: 500.0000 mg | ORAL_TABLET | Freq: Every day | ORAL | Status: AC

## 2011-06-22 NOTE — Assessment & Plan Note (Signed)
Has been sick for a couple of weeks CXR may show early LLL consolidation (hard to see diaphragm) but not clear cut Will treat with levaquin Brief course of prednisone Cough med

## 2011-06-22 NOTE — Progress Notes (Signed)
Subjective:    Patient ID: Anita Small, female    DOB: 08-24-1950, 60 y.o.   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 Especially bad when supine No sig SOB No sore throat No otalgia but some headaches  Has not uses any meds for this  Current Outpatient Prescriptions on File Prior to Visit  Medication Sig Dispense Refill  . buPROPion (WELLBUTRIN SR) 150 MG 12 hr tablet Take 1 tablet (150 mg total) by mouth 2 (two) times daily.  180 tablet  1  . cetirizine (ZYRTEC) 10 MG tablet Take 1/2 tablet by mouth once daily       . citalopram (CELEXA) 40 MG tablet Take 0.5 tablets (20 mg total) by mouth daily.  30 tablet  6  . Cyanocobalamin (VITAMIN B 12 PO) Take by mouth. Unsure mil gram. Takes two daily       . divalproex (DEPAKOTE) 500 MG DR tablet Take 1 tablet (500 mg total) by mouth 3 (three) times daily.  90 tablet  3  . estradiol (ESTRACE) 0.1 MG/GM vaginal cream Apply 1/4  Applicator full two times per week  42.5 g  1  . fish oil-omega-3 fatty acids 1000 MG capsule Take 2 g by mouth daily.        . fluocinolone (SYNALAR) 0.025 % cream Apply topically 2 (two) times daily.        Marland Kitchen glipiZIDE (GLUCOTROL) 5 MG tablet Take 1 tablet (5 mg total) by mouth 2 (two) times daily before a meal.  180 tablet  3  . glucose blood (RELION GLUCOSE TEST STRIPS) test strip 1 each. Use as instructed (3 times a day)       . HYDROcodone-acetaminophen (VICODIN) 5-500 MG per tablet Take 1 tablet by mouth every 4 (four) hours as needed.  30 tablet  0  . insulin detemir (LEVEMIR) 100 UNIT/ML injection Inject 50 Units into the skin daily. As directed      . Insulin Syringe-Needle U-100  (ULTICARE INSULIN SYRINGE) 31G X 5/16" 1 ML MISC Use as directed.  100 each  3  . levothyroxine (SYNTHROID, LEVOTHROID) 100 MCG tablet Take 1 tablet (100 mcg total) by mouth daily.  90 tablet  3  . lisinopril-hydrochlorothiazide (PRINZIDE,ZESTORETIC) 10-12.5 MG per tablet Take 1 tablet by mouth daily.  90 tablet  3  . LORazepam (ATIVAN) 0.5 MG tablet Take 1 or 2 tablets by mouth 2 times a day as needed for nerves       . metFORMIN (GLUCOPHAGE) 500 MG tablet Take 1 tablet (500 mg total) by mouth 2 (two) times daily with a meal.  180 tablet  3  . naproxen sodium (ANAPROX) 220 MG tablet Take 220 mg by mouth 2 (two) times daily with meals.        . traZODone (DESYREL) 100 MG tablet Take 1 tablet (100 mg total) by mouth at bedtime.  30 tablet  11  . vitamin E 200 UNIT capsule Take 200 Units by mouth 2 (two) times daily.         Current Facility-Administered Medications on File Prior to Visit  Medication Dose Route Frequency Provider Last Rate  Last Dose  . 0.9 %  sodium chloride infusion  500 mL Intravenous Continuous Eliezer Bottom., MD,FACG        Allergies  Allergen Reactions  . Codeine Phosphate     REACTION: unspecified  . Duloxetine     REACTION: unspecified  . Meperidine Hcl     REACTION: unspecified  . Venlafaxine     REACTION: unspecified    Past Medical History  Diagnosis Date  . Depression     Paranoid tendencies  . Diabetes mellitus     Type II  . GERD (gastroesophageal reflux disease)   . Hypertension   . Anxiety   . Arthritis     Left knee  . Adenomatous polyp 1999  . Hyperlipidemia   . Hypothyroidism   . Obesity   . Diverticulosis     Past Surgical History  Procedure Date  . Heel spur surgery 03/03    Left  . Knee arthroscopy 2001    Left   Katrinka Blazing)  . Abdominal hysterectomy 1977    One ovary left  . Bladder repair 1977    Tacking  . Cervical spine surgery 1999  . Ct abd w & pelvis wo cm 2002    Negative. Pituitary normal on MRI  . Carotid  endarterectomy 2/11    Dr Evette Cristal  . Cardiovascular stress test 7/00    negative. No cardiolite  . Transthoracic echocardiogram 3/04    normal  . Appendectomy   . Cholecystectomy   . Total knee arthroplasty 11/12    left--Dr Katrinka Blazing    Family History  Problem Relation Age of Onset  . Cancer Mother     Breast  . Heart disease Mother   . Diabetes Mother   . Heart disease Father   . Diabetes Father   . Hypertension Sister   . Cancer Paternal Grandmother     ? throat CA  . Depression Daughter   . Colon cancer      Unsure if mother had it   . Colon polyps Mother     History   Social History  . Marital Status: Widowed    Spouse Name: N/A    Number of Children: 3  . Years of Education: N/A   Occupational History  . 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     Marland Kitchen      Social History Main Topics  . Smoking status: Former Smoker    Types: Cigarettes  . Smokeless tobacco: Never Used  . Alcohol Use: No  . Drug Use: No  . Sexually Active: Not on file   Other Topics Concern  . Not on file   Social History Narrative   Widowed 07/02.  Kids have moved out and now living with friend to share expenses.3 caffeine drinks daily    Review of Systems No vomiting--has some nausea Does have loose stools     Objective:   Physical Exam  Constitutional: She appears well-developed and well-nourished.       Appears mildly ill and tired but no distress  HENT:  Mouth/Throat: Oropharynx is clear and moist. No oropharyngeal exudate.       Mild nasal congestion No sinus tenderness  Neck: Normal range of motion.  Pulmonary/Chest: Effort normal. No respiratory distress. She has wheezes. She has no rales.       No prolonged exp phase but does have slight exp wheeze and deep breath causes cough  Musculoskeletal: She exhibits no edema.  Lymphadenopathy:    She has no cervical adenopathy.          Assessment & Plan:

## 2011-06-25 ENCOUNTER — Telehealth: Payer: Self-pay | Admitting: Internal Medicine

## 2011-06-25 NOTE — Telephone Encounter (Signed)
Patient called and stated she feels better today and wanted to tell you to have a good trip.

## 2011-06-27 ENCOUNTER — Ambulatory Visit: Payer: PRIVATE HEALTH INSURANCE | Admitting: Internal Medicine

## 2011-06-28 NOTE — Telephone Encounter (Signed)
Good to know and thanks

## 2011-07-04 ENCOUNTER — Ambulatory Visit: Payer: PRIVATE HEALTH INSURANCE | Admitting: Internal Medicine

## 2011-07-17 ENCOUNTER — Ambulatory Visit: Payer: PRIVATE HEALTH INSURANCE | Admitting: Internal Medicine

## 2011-08-07 ENCOUNTER — Encounter: Payer: Self-pay | Admitting: Internal Medicine

## 2011-08-07 ENCOUNTER — Ambulatory Visit (INDEPENDENT_AMBULATORY_CARE_PROVIDER_SITE_OTHER): Payer: PRIVATE HEALTH INSURANCE | Admitting: Internal Medicine

## 2011-08-07 VITALS — BP 150/80 | HR 81 | Temp 98.6°F | Ht 62.0 in | Wt 241.0 lb

## 2011-08-07 DIAGNOSIS — I10 Essential (primary) hypertension: Secondary | ICD-10-CM

## 2011-08-07 DIAGNOSIS — E039 Hypothyroidism, unspecified: Secondary | ICD-10-CM

## 2011-08-07 DIAGNOSIS — E785 Hyperlipidemia, unspecified: Secondary | ICD-10-CM

## 2011-08-07 DIAGNOSIS — F3289 Other specified depressive episodes: Secondary | ICD-10-CM

## 2011-08-07 DIAGNOSIS — Z23 Encounter for immunization: Secondary | ICD-10-CM

## 2011-08-07 DIAGNOSIS — E1149 Type 2 diabetes mellitus with other diabetic neurological complication: Secondary | ICD-10-CM

## 2011-08-07 DIAGNOSIS — F329 Major depressive disorder, single episode, unspecified: Secondary | ICD-10-CM

## 2011-08-07 DIAGNOSIS — Z2911 Encounter for prophylactic immunotherapy for respiratory syncytial virus (RSV): Secondary | ICD-10-CM

## 2011-08-07 DIAGNOSIS — E1142 Type 2 diabetes mellitus with diabetic polyneuropathy: Secondary | ICD-10-CM

## 2011-08-07 LAB — CBC WITH DIFFERENTIAL/PLATELET
Basophils Relative: 1 % (ref 0.0–3.0)
Eosinophils Relative: 1.7 % (ref 0.0–5.0)
HCT: 39.8 % (ref 36.0–46.0)
Hemoglobin: 12.8 g/dL (ref 12.0–15.0)
Lymphocytes Relative: 38.6 % (ref 12.0–46.0)
Lymphs Abs: 2.6 10*3/uL (ref 0.7–4.0)
Monocytes Relative: 6 % (ref 3.0–12.0)
Neutro Abs: 3.5 10*3/uL (ref 1.4–7.7)
RBC: 4.61 Mil/uL (ref 3.87–5.11)
RDW: 16.8 % — ABNORMAL HIGH (ref 11.5–14.6)
WBC: 6.7 10*3/uL (ref 4.5–10.5)

## 2011-08-07 LAB — MICROALBUMIN / CREATININE URINE RATIO
Microalb Creat Ratio: 7.9 mg/g (ref 0.0–30.0)
Microalb, Ur: 4.8 mg/dL — ABNORMAL HIGH (ref 0.0–1.9)

## 2011-08-07 LAB — HEMOGLOBIN A1C: Hgb A1c MFr Bld: 7.4 % — ABNORMAL HIGH (ref 4.6–6.5)

## 2011-08-07 MED ORDER — HYDROCODONE-ACETAMINOPHEN 5-500 MG PO TABS: 1.0000 | ORAL_TABLET | ORAL | Status: DC | PRN

## 2011-08-07 NOTE — Assessment & Plan Note (Signed)
Lab Results  Component Value Date   LDLCALC 71 09/19/2010   Due for labs

## 2011-08-07 NOTE — Assessment & Plan Note (Signed)
BP Readings from Last 3 Encounters:  08/07/11 150/80  06/22/11 148/80  05/08/11 160/80   Still not at goal Will increase the lisinopril

## 2011-08-07 NOTE — Assessment & Plan Note (Signed)
Mood has been okay despite ongoing life stress Still on meds

## 2011-08-07 NOTE — Assessment & Plan Note (Signed)
Hasn't been checking often Will recheck labs today Needs to get back to proper eating

## 2011-08-07 NOTE — Progress Notes (Signed)
Subjective:    Patient ID: Anita Small, female    DOB: 23-Apr-1951, 61 y.o.   MRN: [id number]  HPI Doing okay Still has soreness at knee post op---gets shot of pain at times Gets around better Done with PT Not doing much exercise as yet Weight is back up a few pounds---appetite was so high (now some better) Plans to get back to Atkins diet  Checks sugars occ Thinks it was 131 No hypoglycemic reactions Has eye appt coming up  No chest pain No SOB No sig edema  Still with stress Daughter lost job Depression is controlled  Current Outpatient Prescriptions on File Prior to Visit  Medication Sig Dispense Refill  . buPROPion (WELLBUTRIN SR) 150 MG 12 hr tablet Take 1 tablet (150 mg total) by mouth 2 (two) times daily.  180 tablet  1  . cetirizine (ZYRTEC) 10 MG tablet Take 1/2 tablet by mouth once daily       . citalopram (CELEXA) 40 MG tablet Take 0.5 tablets (20 mg total) by mouth daily.  30 tablet  6  . Cyanocobalamin (VITAMIN B 12 PO) Take by mouth. Unsure mil gram. Takes two daily       . divalproex (DEPAKOTE) 500 MG DR tablet Take 1 tablet (500 mg total) by mouth 3 (three) times daily.  90 tablet  3  . estradiol (ESTRACE) 0.1 MG/GM vaginal cream Apply 1/4  Applicator full two times per week  42.5 g  1  . fluocinolone (SYNALAR) 0.025 % cream Apply topically 2 (two) times daily.        Marland Kitchen glipiZIDE (GLUCOTROL) 5 MG tablet Take 1 tablet (5 mg total) by mouth 2 (two) times daily before a meal.  180 tablet  3  . glucose blood (RELION GLUCOSE TEST STRIPS) test strip 1 each. Use as instructed (3 times a day)       . HYDROcodone-acetaminophen (VICODIN) 5-500 MG per tablet Take 1 tablet by mouth every 4 (four) hours as needed.  30 tablet  0  . insulin detemir (LEVEMIR) 100 UNIT/ML injection Inject 50 Units into the skin daily. As directed      . Insulin Syringe-Needle U-100 (ULTICARE INSULIN SYRINGE) 31G X 5/16" 1 ML MISC Use as directed.  100 each  3  . levothyroxine (SYNTHROID,  LEVOTHROID) 100 MCG tablet Take 1 tablet (100 mcg total) by mouth daily.  90 tablet  3  . lisinopril-hydrochlorothiazide (PRINZIDE,ZESTORETIC) 10-12.5 MG per tablet Take 1 tablet by mouth daily.  90 tablet  3  . LORazepam (ATIVAN) 0.5 MG tablet Take 1 or 2 tablets by mouth 2 times a day as needed for nerves       . metFORMIN (GLUCOPHAGE) 500 MG tablet Take 1 tablet (500 mg total) by mouth 2 (two) times daily with a meal.  180 tablet  3  . naproxen sodium (ANAPROX) 220 MG tablet Take 220 mg by mouth 2 (two) times daily with meals.        . simvastatin (ZOCOR) 80 MG tablet Take 40 mg by mouth at bedtime.        . traZODone (DESYREL) 100 MG tablet Take 1 tablet (100 mg total) by mouth at bedtime.  30 tablet  11   Current Facility-Administered Medications on File Prior to Visit  Medication Dose Route Frequency Provider Last Rate Last Dose  . 0.9 %  sodium chloride infusion  500 mL Intravenous Continuous Eliezer Bottom., MD,FACG        Allergies  Allergen  Reactions  . Codeine Phosphate     REACTION: unspecified  . Duloxetine     REACTION: unspecified  . Meperidine Hcl     REACTION: unspecified  . Venlafaxine     REACTION: unspecified    Past Medical History  Diagnosis Date  . Depression     Paranoid tendencies  . Diabetes mellitus     Type II  . GERD (gastroesophageal reflux disease)   . Hypertension   . Anxiety   . Arthritis     Left knee  . Adenomatous polyp 1999  . Hyperlipidemia   . Hypothyroidism   . Obesity   . Diverticulosis     Past Surgical History  Procedure Date  . Heel spur surgery 03/03    Left  . Knee arthroscopy 2001    Left   Katrinka Blazing)  . Abdominal hysterectomy 1977    One ovary left  . Bladder repair 1977    Tacking  . Cervical spine surgery 1999  . Ct abd w & pelvis wo cm 2002    Negative. Pituitary normal on MRI  . Carotid endarterectomy 2/11    Dr Evette Cristal  . Cardiovascular stress test 7/00    negative. No cardiolite  . Transthoracic  echocardiogram 3/04    normal  . Appendectomy   . Cholecystectomy   . Total knee arthroplasty 11/12    left--Dr Katrinka Blazing    Family History  Problem Relation Age of Onset  . Cancer Mother     Breast  . Heart disease Mother   . Diabetes Mother   . Heart disease Father   . Diabetes Father   . Hypertension Sister   . Cancer Paternal Grandmother     ? throat CA  . Depression Daughter   . Colon cancer      Unsure if mother had it   . Colon polyps Mother     History   Social History  . Marital Status: Widowed    Spouse Name: N/A    Number of Children: 3  . Years of Education: N/A   Occupational History  . 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     Marland Kitchen      Social History Main Topics  . Smoking status: Former Smoker    Types: Cigarettes  . Smokeless tobacco: Never Used  . Alcohol Use: No  . Drug Use: No  . Sexually Active: Not on file   Other Topics Concern  . Not on file   Social History Narrative   Widowed 07/02.  Kids have moved out and now living with friend to share expenses.3 caffeine drinks daily    Review of Systems Sleeps well with the trazodone Bowels are fine     Objective:   Physical Exam  Constitutional: She appears well-developed and well-nourished. No distress.  Neck: Normal range of motion. Neck supple.  Cardiovascular: Normal rate, regular rhythm, normal heart sounds and intact distal pulses.  Exam reveals no gallop.   No murmur heard. Pulmonary/Chest: Effort normal and breath sounds normal. No respiratory distress. She has no wheezes. She has no rales.  Abdominal: Soft. There is no tenderness.  Musculoskeletal: She exhibits no edema and no tenderness.  Lymphadenopathy:    She has no cervical adenopathy.  Neurological:       Normal sensation on plantar feet  Psychiatric: She has a normal mood and affect. Her behavior is normal. Judgment and thought content normal.  Assessment & Plan:

## 2011-08-08 LAB — LIPID PANEL
Cholesterol: 149 mg/dL (ref 0–200)
LDL Cholesterol: 78 mg/dL (ref 0–99)

## 2011-08-08 LAB — HEPATIC FUNCTION PANEL
ALT: 12 U/L (ref 0–35)
AST: 14 U/L (ref 0–37)
Albumin: 3.7 g/dL (ref 3.5–5.2)
Alkaline Phosphatase: 57 U/L (ref 39–117)
Bilirubin, Direct: 0.1 mg/dL (ref 0.0–0.3)
Total Protein: 6.8 g/dL (ref 6.0–8.3)

## 2011-08-08 LAB — BASIC METABOLIC PANEL
Calcium: 8.9 mg/dL (ref 8.4–10.5)
GFR: 98.04 mL/min (ref >60.00)
Glucose, Bld: 120 mg/dL — ABNORMAL HIGH (ref 70–99)
Potassium: 4.1 mEq/L (ref 3.5–5.1)
Sodium: 140 mEq/L (ref 135–145)

## 2011-08-21 ENCOUNTER — Telehealth: Payer: Self-pay | Admitting: Internal Medicine

## 2011-08-21 MED ORDER — LISINOPRIL-HYDROCHLOROTHIAZIDE 20-25 MG PO TABS: 1.0000 | ORAL_TABLET | Freq: Every day | ORAL | Status: DC

## 2011-08-21 NOTE — Telephone Encounter (Signed)
Spoke with patient and advised results rx sent to pharmacy by e-script  

## 2011-08-21 NOTE — Telephone Encounter (Signed)
Triage Record Num: [id number] Operator: Di Kindle Patient Name: Anita Small Call Date & Time: 08/21/2011 10:45:43AM Patient Phone: [phone] PCP: Tillman Abide Patient Gender: Female PCP Fax : [phone] Patient DOB: [birth year] Practice Name: [facility] Reason for Call: Caller: Mertice/Patient is calling with a question about BP Med.The medication was written by Tillman Abide I.. States onset 08/07/11 of large ~ 2 inches round circle on her right arm, below injection site, itchy. Symptoms reviewed Skin Lesio Guideline, with possible ringworm, verbalizes understanding of home care, with call back parameters reviewd. RX request Follows OV 08/07/11, states BP med, Lisinopril/HCTZ was to be increased; RX new to [facility]. OFFICE Please Protocol(s) Used: Skin Lesions Recommended Outcome per Protocol: See Provider within 72 Hours Reason for Outcome: New onset of circular raised, red, itchy patch(es) of skin; may have center of normal looking skin. May cause bald patches on scalp or beard. Care Advice: ~ Do not scratch or rub lesions to decrease the chance of secondary infection. Consider application of an OTC antifungal preparation (e.g., Micatin, Tinactin, Lamisil) to relieve symptoms and to prevent further excoriation. Apply as directed on label or by pharmacist. Symptoms usually improve within 4 weeks; continue applying skin medication for 7 days after rash is cleared. If symptoms do not improve within one week or is not cleared in 4 weeks, your provider may prescribe a topical or oral medication. ~ This skin lesion could be ringworm. Ringworm has nothing to do with worms but is a fungal infection. It is contagious and can be spread person to person, animal to human, object to human, or soil to human. Once treated, hair will usually regrow. ~ Prevent the spread of infection by not sharing any personal items, avoiding skin contact, and washing hands  briskly using warm water and soap for at least 15 seconds, or use a 62% alcohol-based hand sanitizer. ~ ~ CAUTIONS 08/21/2011 10:57:28AM Page 1 of 1 [id number]

## 2011-08-21 NOTE — Telephone Encounter (Signed)
Yes I must have forgotten to write increased dose  Should go up to lisinopril HCTZ 20/25 1 year Rx Schedule met b for about 2-3 weeks after the increase to check kidney and potassium

## 2011-08-31 ENCOUNTER — Other Ambulatory Visit: Payer: Self-pay | Admitting: *Deleted

## 2011-08-31 MED ORDER — TRAZODONE HCL 100 MG PO TABS: 100.0000 mg | ORAL_TABLET | Freq: Every evening | ORAL | Status: DC | PRN

## 2011-08-31 NOTE — Telephone Encounter (Signed)
Okay to change sig to 1-2 at bedtime to help sleep #60 x 11

## 2011-08-31 NOTE — Telephone Encounter (Signed)
rx sent to pharmacy by e-script Med list updated 

## 2011-08-31 NOTE — Telephone Encounter (Signed)
Fax from [facility] is on your desk.  Pt is requesting 60 trazodone at bedtime, instead of 30.

## 2011-10-05 ENCOUNTER — Other Ambulatory Visit: Payer: Self-pay

## 2011-10-05 MED ORDER — CITALOPRAM HYDROBROMIDE 40 MG PO TABS: 20.0000 mg | ORAL_TABLET | Freq: Every day | ORAL | Status: DC

## 2011-10-05 NOTE — Telephone Encounter (Signed)
Patient taking 1/2 tab daily rx sent to pharmacy by e-script

## 2011-10-05 NOTE — Telephone Encounter (Signed)
[facility] faxed refill request Citalopram 40 mg with instructions taking 1 tab daily. Pts med list has Citalopram 40 mg with instructions taking 1/2 tab by mouth daily. Pt last seen 08/07/11. I called pt's home and young girl said to call cell. I called cell# and mailbox is full and could not leave message.Please advise.

## 2011-10-16 ENCOUNTER — Telehealth: Payer: Self-pay

## 2011-10-16 NOTE — Telephone Encounter (Signed)
Pt left v/m requesting last A1C. Left v/m for pt to call back.

## 2011-10-17 NOTE — Telephone Encounter (Signed)
Pt said she had called back and Lyla Son gave pt info needed.

## 2011-12-24 ENCOUNTER — Other Ambulatory Visit: Payer: Self-pay | Admitting: *Deleted

## 2011-12-24 MED ORDER — SIMVASTATIN 80 MG PO TABS: 40.0000 mg | ORAL_TABLET | Freq: Every day | ORAL | Status: DC

## 2011-12-31 ENCOUNTER — Other Ambulatory Visit: Payer: Self-pay | Admitting: *Deleted

## 2011-12-31 MED ORDER — METFORMIN HCL 500 MG PO TABS: 500.0000 mg | ORAL_TABLET | Freq: Two times a day (BID) | ORAL | Status: DC

## 2012-02-05 ENCOUNTER — Other Ambulatory Visit: Payer: Self-pay | Admitting: *Deleted

## 2012-02-05 MED ORDER — INSULIN DETEMIR 100 UNIT/ML ~~LOC~~ SOLN: 100.0000 [IU] | Freq: Every day | SUBCUTANEOUS | Status: DC

## 2012-02-06 MED ORDER — HYDROCODONE-ACETAMINOPHEN 5-500 MG PO TABS: 1.0000 | ORAL_TABLET | ORAL | Status: DC | PRN

## 2012-02-06 NOTE — Telephone Encounter (Signed)
Okay #60 x 0 

## 2012-02-06 NOTE — Telephone Encounter (Signed)
rx called into pharmacy

## 2012-02-11 ENCOUNTER — Encounter: Payer: Self-pay | Admitting: Internal Medicine

## 2012-02-11 ENCOUNTER — Ambulatory Visit (INDEPENDENT_AMBULATORY_CARE_PROVIDER_SITE_OTHER): Payer: PRIVATE HEALTH INSURANCE | Admitting: Internal Medicine

## 2012-02-11 VITALS — BP 140/80 | HR 88 | Temp 98.1°F | Ht 62.0 in | Wt 252.0 lb

## 2012-02-11 DIAGNOSIS — L03039 Cellulitis of unspecified toe: Secondary | ICD-10-CM | POA: Insufficient documentation

## 2012-02-11 DIAGNOSIS — Z1231 Encounter for screening mammogram for malignant neoplasm of breast: Secondary | ICD-10-CM

## 2012-02-11 DIAGNOSIS — E1142 Type 2 diabetes mellitus with diabetic polyneuropathy: Secondary | ICD-10-CM

## 2012-02-11 DIAGNOSIS — Z0000 Encounter for general adult medical examination without abnormal findings: Secondary | ICD-10-CM

## 2012-02-11 DIAGNOSIS — F411 Generalized anxiety disorder: Secondary | ICD-10-CM

## 2012-02-11 DIAGNOSIS — E1149 Type 2 diabetes mellitus with other diabetic neurological complication: Secondary | ICD-10-CM

## 2012-02-11 DIAGNOSIS — Z23 Encounter for immunization: Secondary | ICD-10-CM

## 2012-02-11 MED ORDER — DOXYCYCLINE HYCLATE 100 MG PO TABS: 100.0000 mg | ORAL_TABLET | Freq: Two times a day (BID) | ORAL | Status: AC

## 2012-02-11 MED ORDER — LORAZEPAM 0.5 MG PO TABS: 0.2500 mg | ORAL_TABLET | Freq: Two times a day (BID) | ORAL | Status: DC | PRN

## 2012-02-11 NOTE — Assessment & Plan Note (Signed)
Weight is up Will check control and may need to boost insulin dose

## 2012-02-11 NOTE — Progress Notes (Signed)
  Subjective:    Patient ID: Anita Small, female    DOB: 05/08/1951, 61 y.o.   MRN: [id number]  HPI Here for physical Middle daughter that was living with her has moved to [place] visiting in [place] Got into debt with her living with her and asked her to leave Not estranged though, fortunately  Has recovered from knee surgery Now with problems with right one---doesn't want TKR after last hard experience Walks with cane  Checks sugars regularly but left list home Not really sure how she is doing overall Has had stomach troubles lately--- frequent loose stools (nerves?)  Stopped all mood meds about 2 months ago [place] Spaniel "I don't need all this mess" Feels she just needs a prn med for when she gets very upset Has had more input from friends and help taking her out, etc  Review of Systems  Constitutional: Positive for unexpected weight change.       Weight has been drifting up again Atkin's diet has been hard due to cost Wears seat belt  HENT: Positive for congestion and rhinorrhea. Negative for hearing loss, dental problem and tinnitus.        Zyrtec controls allergy symptoms Regular with dentist  Eyes: Negative for visual disturbance.       No diplopia or unlateral vision loss  Respiratory: Negative for cough, chest tightness, shortness of breath and wheezing.   Cardiovascular: Positive for leg swelling. Negative for chest pain and palpitations.       Mild edema in left ankle---seems better lately  Gastrointestinal: Positive for abdominal pain and diarrhea. Negative for nausea, vomiting, constipation and blood in stool.       Abdominal pain after eating---resolved with loose stool (cramps)  Genitourinary: Positive for urgency and frequency. Negative for dysuria and difficulty urinating.       Urge incontinence if she doesn't void frequently enough  Musculoskeletal: Positive for arthralgias. Negative for back pain and joint swelling.  Skin: Negative for rash.       Red area  between right 2nd and 3rd toes---split area  Neurological: Negative for dizziness, syncope, weakness, light-headedness, numbness and headaches.  Psychiatric/Behavioral: Negative for disturbed wake/sleep cycle and dysphoric mood. The patient is nervous/anxious.        Objective:   Physical Exam  Constitutional: She is oriented to person, place, and time. She appears well-developed. No distress.  HENT:  Head: Normocephalic and atraumatic.  Right Ear: External ear normal.  Left Ear: External ear normal.  Mouth/Throat: Oropharynx is clear and moist. No oropharyngeal exudate.  Eyes: Conjunctivae and EOM are normal. Pupils are equal, round, and reactive to light.  Neck: Normal range of motion. Neck supple. No thyromegaly present.  Cardiovascular: Normal rate, regular rhythm, normal heart sounds and intact distal pulses.  Exam reveals no gallop.   No murmur heard. Pulmonary/Chest: Effort normal and breath sounds normal. No respiratory distress. She has no wheezes. She has no rales.  Abdominal: Soft. There is no tenderness.  Musculoskeletal: She exhibits no edema and no tenderness.  Lymphadenopathy:    She has no cervical adenopathy.    She has no axillary adenopathy.  Neurological: She is alert and oriented to person, place, and time.  Skin: No rash noted.       Crusted lesion on bottom of right 3rd toe Open area inbetween 2nd and 3rd right toes          Assessment & Plan:

## 2012-02-11 NOTE — Assessment & Plan Note (Signed)
Has slipped on low carb diet and gained weight---counseled Due for Tdap Will set up mammo

## 2012-02-11 NOTE — Assessment & Plan Note (Signed)
Still has stress Will give Rx for lorazepam for prn use

## 2012-02-11 NOTE — Assessment & Plan Note (Signed)
Not clear cut but could be MRSA Will treat with doxy

## 2012-02-15 ENCOUNTER — Encounter: Payer: Self-pay | Admitting: *Deleted

## 2012-02-19 ENCOUNTER — Other Ambulatory Visit: Payer: Self-pay

## 2012-02-19 NOTE — Telephone Encounter (Signed)
Pt request 90 day rx for lisinopril HCTZ  20-25 mg. 90 day refill with 1 year of refill given on 08/21/11 to [facility]. Spoke with Onalee Hua at [facility] and he said he will try to run thru as 90 day prescription but insurance may only allow a 30 day rx at a time. Patient notified as instructed by telephone.

## 2012-02-22 ENCOUNTER — Telehealth: Payer: Self-pay | Admitting: Internal Medicine

## 2012-02-22 NOTE — Telephone Encounter (Signed)
Caller: Mirra/Patient; Patient Name: Anita Small; PCP: Tillman Abide [facility]); Best Callback Phone Number: [phone].  Patient calling today 02/22/12 regarding was prescribed Doxycicline by Dr. Alphonsus Sias on 02/11/12 for infection between toes.  Patient did not take first dose until Wednesday 02/20/12.  Right after she took dose she was having breathing difficulty, started breaking out in rash, went to [facility] and testing was done and was determined she had an allergic reaction to the medication.  No rashes now, ED just wanted her to follow up with her PCP to let them know she did have an allergic reaction to the medication.  Infection between toes looks better, she has been applying Neosporin.  Afebrile.  Advised patient MD may want to change her antibiotics since she only took the one pill of the Doxycicline or he may want her to come in office again to be seen. Emergent symptoms ruled out by Skin Lesions guidelines with exception of ulcer on lower leg or over any pressure point that was previously evaluated and not healing.  (See Provider Within 2 Weeks).  OFFICE PLEASE CALL PATIENT BACK AT [phone] TO LET HER KNOW IF MD WILL BE CALLING IN A DIFFERENT ANTIBIOTIC OR IF APPOINTMENT NEEDS TO BE MADE FOR HER TO COME BACK IN AGAIN. PHARMACY IS  [facility] [phone].

## 2012-02-22 NOTE — Telephone Encounter (Signed)
Spoke with patient and advised results   

## 2012-02-22 NOTE — Telephone Encounter (Signed)
Please add doxycycline as severe allergic reaction  If the areas between her toes are better with just the topical Rx--just continue this. If they worsen, I will send another Rx

## 2012-03-19 ENCOUNTER — Telehealth: Payer: Self-pay | Admitting: *Deleted

## 2012-03-19 NOTE — Telephone Encounter (Signed)
Patient calling and would like to restart Celexa, pt has been crying and feeling emotional, pt realize she can't do without it, she uses [facility] pharmacy, please advise

## 2012-03-20 MED ORDER — CITALOPRAM HYDROBROMIDE 20 MG PO TABS: 20.0000 mg | ORAL_TABLET | Freq: Every day | ORAL | Status: DC

## 2012-03-20 NOTE — Telephone Encounter (Signed)
rx sent to pharmacy by e-script Spoke with patient and advised results   

## 2012-03-20 NOTE — Telephone Encounter (Signed)
Okay to send Rx for citalopram 20mg   #30 x 1 1 daily Set up appt in about 3 weeks to review how she is doing

## 2012-03-27 ENCOUNTER — Other Ambulatory Visit: Payer: Self-pay | Admitting: *Deleted

## 2012-03-27 MED ORDER — GLIPIZIDE 5 MG PO TABS: 5.0000 mg | ORAL_TABLET | Freq: Two times a day (BID) | ORAL | Status: DC

## 2012-04-09 ENCOUNTER — Telehealth: Payer: Self-pay | Admitting: Internal Medicine

## 2012-04-09 NOTE — Telephone Encounter (Signed)
Caller: [name]/Patient; Patient Name: Anita Small; PCP: Tillman Abide [facility]); Best Callback Phone Number: [phone].  Call regarding Vaginal burning w/ abnormal discharge, onset 1 week.  Pt is Diabetic.  All emergent symptoms ruled out per Vaginal Discharge protocol, see in 24 hrs due to foul-smelling and unusual vaginal discharge. Appt offered, Pt does not have transportation, states she has someone that can pick up a script for her.  Pt last seen on 8-19.  Pt uses [facility], [phone].  Please follow up w/ Pt.

## 2012-04-10 ENCOUNTER — Telehealth: Payer: Self-pay | Admitting: Internal Medicine

## 2012-04-10 MED ORDER — FLUCONAZOLE 150 MG PO TABS: ORAL_TABLET | ORAL | Status: DC

## 2012-04-10 NOTE — Telephone Encounter (Signed)
Okay to send Rx for fluconazole 150mg  #2 x 1 Take one now and repeat in 1 week prn  If this doesn't work ( for probably yeast infection), she will need OV

## 2012-04-10 NOTE — Telephone Encounter (Signed)
Caller: Marceline/Patient; Patient Name: Anita Small; PCP: Tillman Abide [facility]); Best Callback Phone Number: [phone] States is returning call to office 10-17. Advised per EPIC, MD has sent script for Diflucan 150mg  #2 take 1 now and repeat in 1 week to pharmacy. If does not work, needs office visit. Voices understanding.

## 2012-04-10 NOTE — Telephone Encounter (Signed)
rx sent to pharmacy by e-script Left message on machine that rx was sent in and to call if she needs a OV

## 2012-04-22 ENCOUNTER — Other Ambulatory Visit: Payer: Self-pay | Admitting: *Deleted

## 2012-04-22 MED ORDER — CITALOPRAM HYDROBROMIDE 20 MG PO TABS: 20.0000 mg | ORAL_TABLET | Freq: Every day | ORAL | Status: DC

## 2012-04-22 NOTE — Telephone Encounter (Signed)
Received a refill request from pharmacy for citalopram, per last refill encounter pt was to f/u with you re this med. Pt does not have an appt scheduled until Feb 2014. I called and left message for pt to call the office.

## 2012-04-22 NOTE — Telephone Encounter (Signed)
She had been on this in the past and restarted If she feels she is doing great now, can keep February visit and refill for a year  Otherwise fill #30 and set up appt

## 2012-04-22 NOTE — Telephone Encounter (Signed)
Spoke with patient and she's doing great, refill sent in and pt will keep her appt in Feb.   Patient would also like refill of hydrocodone, please advise, last refilled 02/05/2012

## 2012-04-23 MED ORDER — HYDROCODONE-ACETAMINOPHEN 5-500 MG PO TABS: 1.0000 | ORAL_TABLET | ORAL | Status: DC | PRN

## 2012-04-23 NOTE — Telephone Encounter (Signed)
rx called into pharmacy

## 2012-04-23 NOTE — Telephone Encounter (Signed)
okay #60 x 0

## 2012-05-07 ENCOUNTER — Telehealth: Payer: Self-pay

## 2012-05-07 NOTE — Telephone Encounter (Signed)
Pt left v/m had pneumonia last year; last pneumovax recorded 06/26/2000; should pt get pneumonia vaccine when gets flu shot.Please advise.

## 2012-05-08 NOTE — Telephone Encounter (Signed)
Spoke with patient and advised results   

## 2012-05-08 NOTE — Telephone Encounter (Signed)
Not now She should get another pneumovax when she turns 63

## 2012-05-26 IMAGING — CR DG CERVICAL SPINE COMPLETE 4+V
6 series · 6 of 6 positions shown · non-contrast
Comparison: None.

CLINICAL DATA: Postop, radiculopathy

CERVICAL SPINE - COMPLETE 4+ VIEW

[view not recorded (1 of 6)]
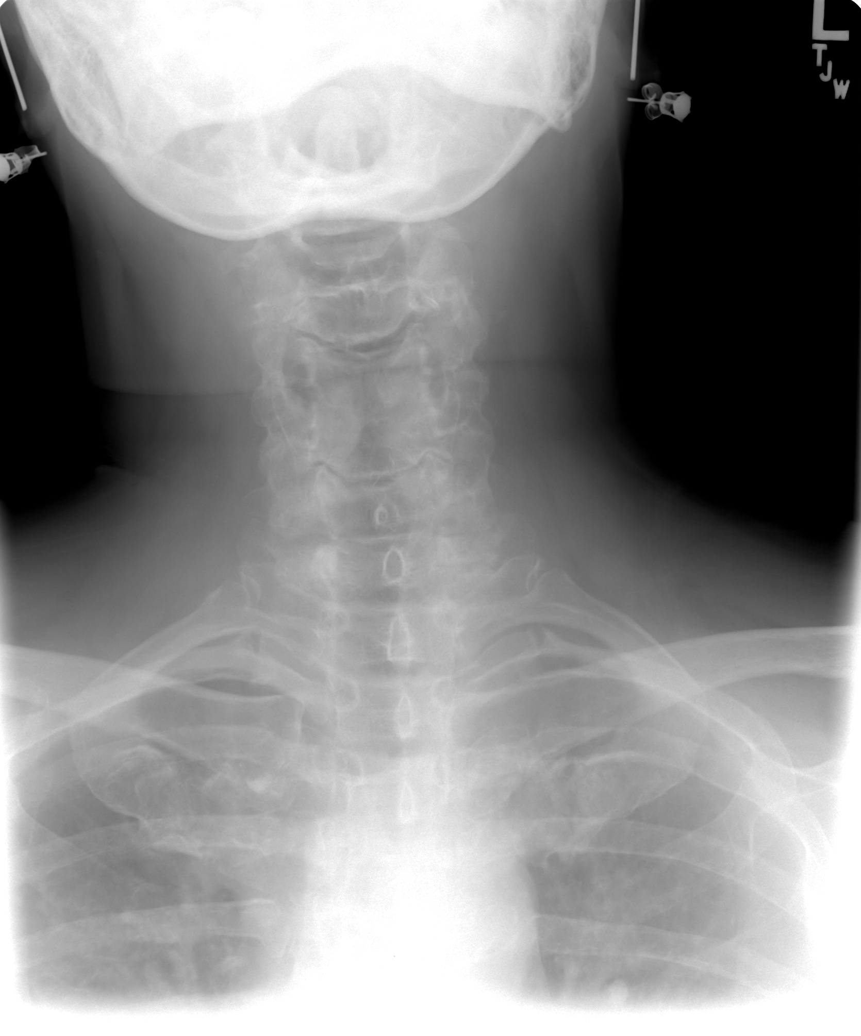

[view not recorded (2 of 6)]
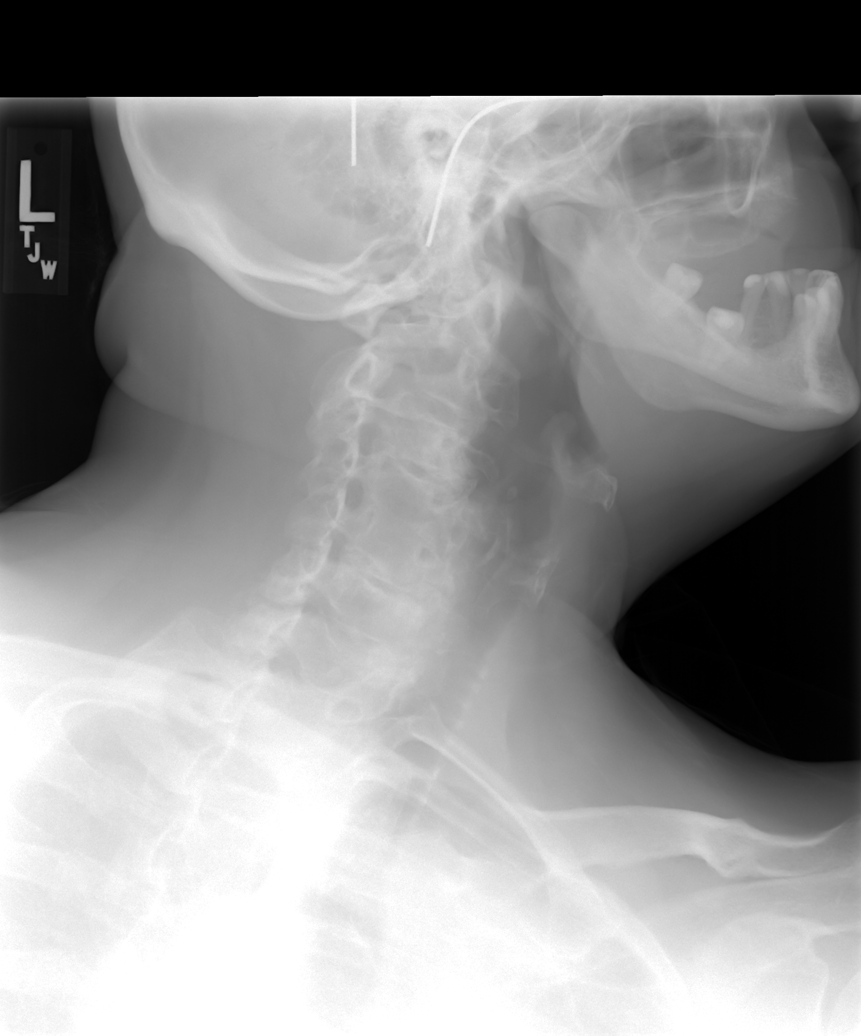

[view not recorded (3 of 6)]
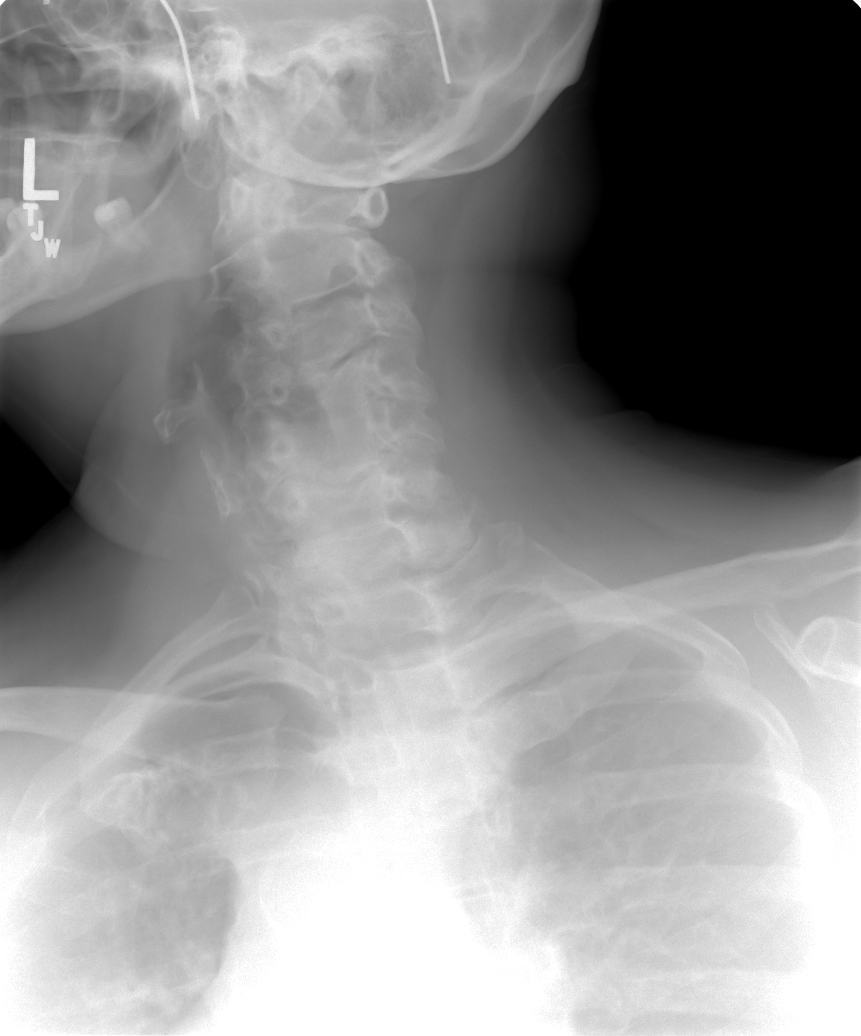

[view not recorded (4 of 6)]
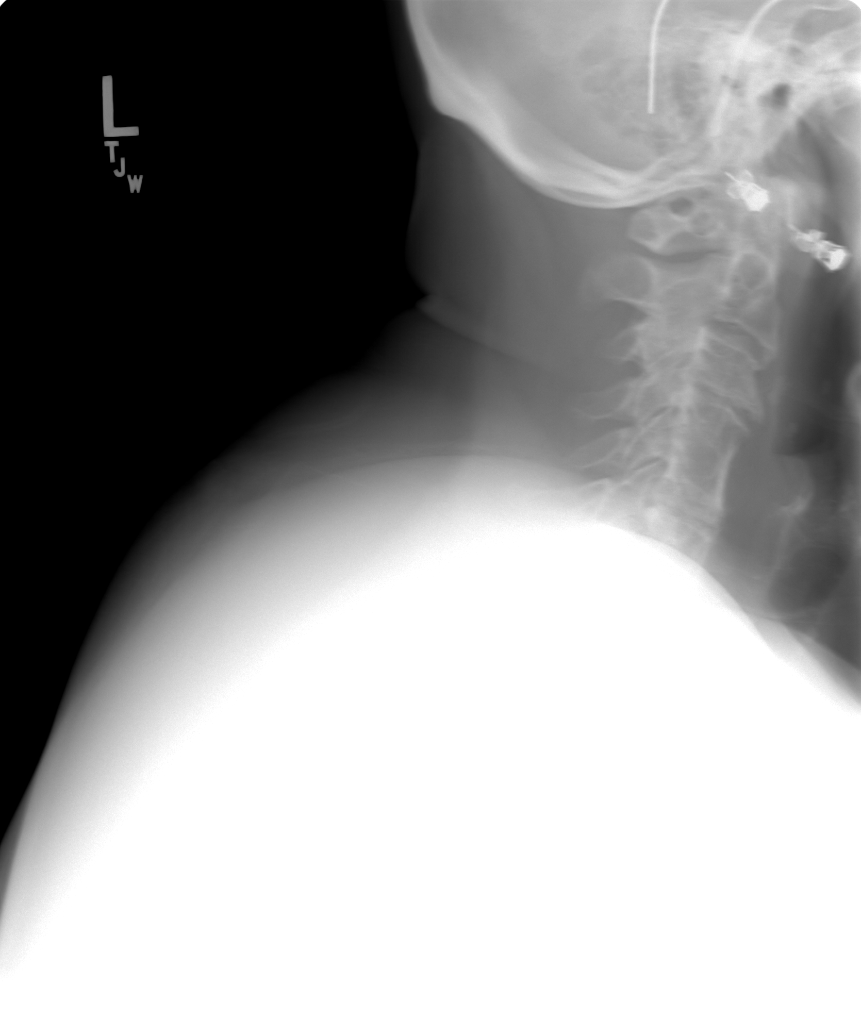

[view not recorded (5 of 6)]
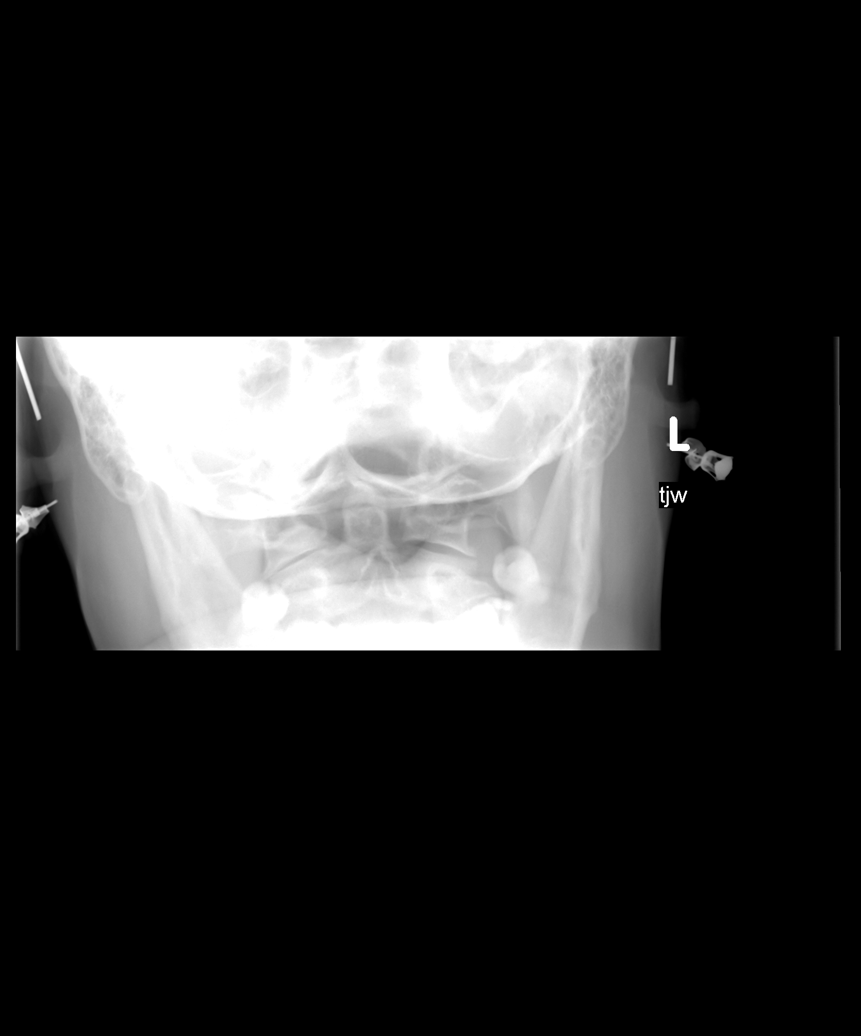

[view not recorded (6 of 6)]
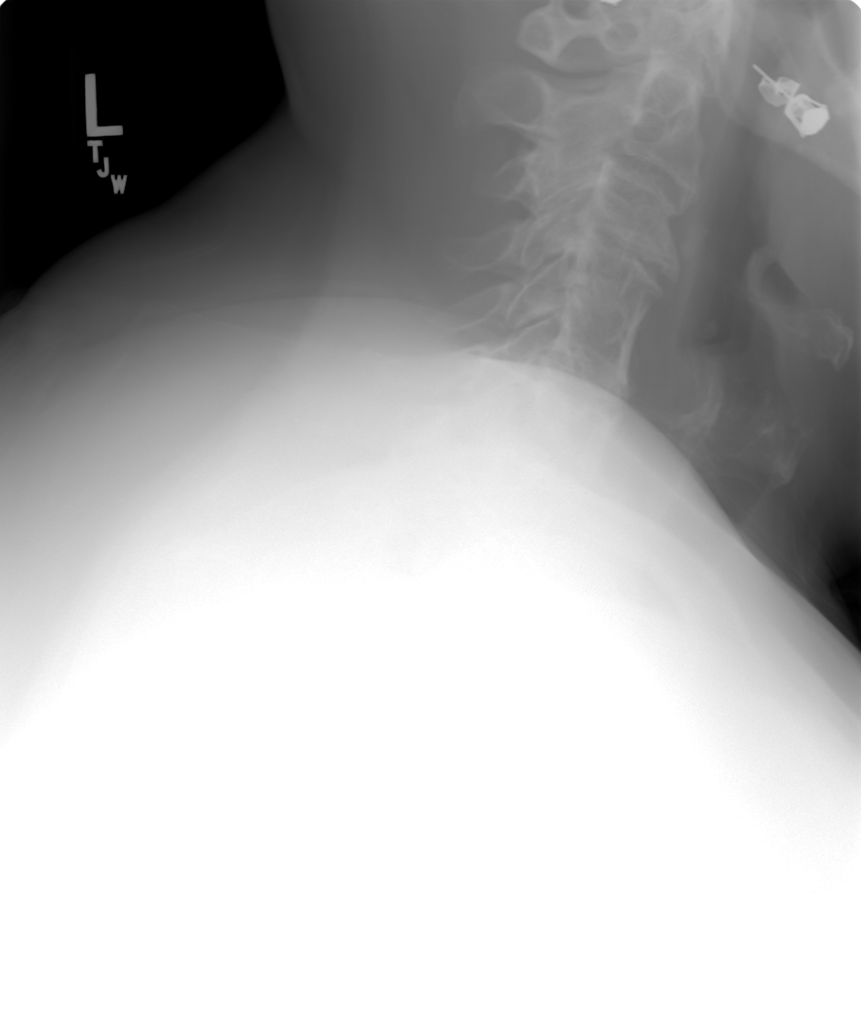

[6 of 6 positions shown; findings below may reference images not displayed]

FINDINGS: The lateral radiograph is limited secondary to patient motion
artifact.

C1 to the inferior endplate of C6 is visualized on the lateral
radiograph.  There is minimal straightening of expected cervical
lordosis.  There is minimal (approximately 2 to 3 mm)
anteriolithesis of C2 upon C3. Vertebral body heights are
preserved.  Prevertebral soft tissues are normal.  Grossly normal
atlantoaxial articulations.  There is minimal leftward deviation of
the dens between the lateral masses of C1, possibly positional.

There is fusion of the C4 and C5 vertebral bodies, likely
postoperative in etiology. Multilevel DDD, most conspicuous at C3 -
C4 and C5 - C6.  The right-sided neural foramina appear patent.
Evaluation of left sided neural foramina is limited secondary to
obliquity.  The visualization of the lung apices is normal.
Regional soft tissues are normal.
IMPRESSION: 1.  Fusion of the C4-C5 vertebral Kinzer, likely postoperative.
2.  Multilevel DDD.

## 2012-06-06 ENCOUNTER — Other Ambulatory Visit: Payer: Self-pay

## 2012-06-06 MED ORDER — LEVOTHYROXINE SODIUM 100 MCG PO TABS: 100.0000 ug | ORAL_TABLET | Freq: Every day | ORAL | Status: DC

## 2012-06-06 NOTE — Telephone Encounter (Signed)
Pt left v/m requesting refill levothyroxine to [facility] [address]. Pt notified done.

## 2012-06-26 ENCOUNTER — Other Ambulatory Visit: Payer: Self-pay

## 2012-06-26 NOTE — Telephone Encounter (Signed)
For 2 weeks pt has had vaginal and perineal itching with slight white discharge; pt's family washed clothes in [place]; pt tried Monistat OTC with no relief. Pt request refill on Diflucan; does not want to schedule appt.Please advise. [facility] [address]Pt request call back.

## 2012-06-27 MED ORDER — FLUCONAZOLE 150 MG PO TABS: ORAL_TABLET | ORAL | Status: DC

## 2012-06-27 NOTE — Telephone Encounter (Signed)
Sent, f/u if not improved.  Hold simvastatin for 3 days while taking diflucan.

## 2012-06-27 NOTE — Telephone Encounter (Signed)
LM with person answering the phone at home number to have pt call back.

## 2012-06-27 NOTE — Telephone Encounter (Signed)
Tried to phone patient again, no answer, no AM.

## 2012-06-30 ENCOUNTER — Telehealth: Payer: Self-pay

## 2012-06-30 NOTE — Telephone Encounter (Signed)
Pt changing pharmacies from [facility] to [facility] [address]. Pt request all meds [facility] to [facility] with 3 month prescriptions. Advised pt should have [facility] [address] contact [facility] for refill transfers and if need further refills [facility] can contact our office. Pt voiced understanding.

## 2012-06-30 NOTE — Telephone Encounter (Signed)
Patient advised.

## 2012-07-18 ENCOUNTER — Telehealth: Payer: Self-pay

## 2012-07-18 NOTE — Telephone Encounter (Signed)
Spoke with patient and advised results, she will check and call us back, pt will come by on Monday for samples.

## 2012-07-18 NOTE — Telephone Encounter (Signed)
Pt left v/m she has not insurance and cannot afford Levemir insulin. Pt wants to know if she could increase metformin or what pt should do. Pt presently taking levemir 50 units at night;FBS running over 200.Please advise.

## 2012-07-18 NOTE — Telephone Encounter (Signed)
She should check with her pharmacist if they still have lente insulin Could substitute this which would be cheaper Can also use regular NPH twice a day Let us know next week

## 2012-08-11 ENCOUNTER — Telehealth: Payer: Self-pay

## 2012-08-11 NOTE — Telephone Encounter (Signed)
Pt left v/m requesting call back from [facility] will cover meds for pt.

## 2012-08-13 ENCOUNTER — Telehealth: Payer: Self-pay | Admitting: *Deleted

## 2012-08-13 MED ORDER — LEVOTHYROXINE SODIUM 100 MCG PO TABS: 100.0000 ug | ORAL_TABLET | Freq: Every day | ORAL | Status: DC

## 2012-08-13 MED ORDER — TRAZODONE HCL 100 MG PO TABS: 100.0000 mg | ORAL_TABLET | Freq: Every evening | ORAL | Status: DC | PRN

## 2012-08-13 MED ORDER — LORATADINE 10 MG PO TABS: 10.0000 mg | ORAL_TABLET | Freq: Every day | ORAL | Status: DC

## 2012-08-13 MED ORDER — ESTRADIOL 0.1 MG/GM VA CREA: TOPICAL_CREAM | VAGINAL | Status: DC

## 2012-08-13 MED ORDER — CITALOPRAM HYDROBROMIDE 20 MG PO TABS: 20.0000 mg | ORAL_TABLET | Freq: Every day | ORAL | Status: DC

## 2012-08-13 MED ORDER — CETIRIZINE HCL 10 MG PO TABS: 5.0000 mg | ORAL_TABLET | Freq: Every day | ORAL | Status: DC

## 2012-08-13 MED ORDER — SIMVASTATIN 80 MG PO TABS: 40.0000 mg | ORAL_TABLET | Freq: Every day | ORAL | Status: DC

## 2012-08-13 MED ORDER — METFORMIN HCL 500 MG PO TABS: 500.0000 mg | ORAL_TABLET | Freq: Two times a day (BID) | ORAL | Status: DC

## 2012-08-13 MED ORDER — GLIPIZIDE 5 MG PO TABS: 5.0000 mg | ORAL_TABLET | Freq: Two times a day (BID) | ORAL | Status: DC

## 2012-08-13 MED ORDER — LISINOPRIL-HYDROCHLOROTHIAZIDE 20-25 MG PO TABS: 1.0000 | ORAL_TABLET | Freq: Every day | ORAL | Status: DC

## 2012-08-13 NOTE — Telephone Encounter (Signed)
Left message asking pt to call back if she needs any refills before Friday, if not we will discuss at her OV on friday

## 2012-08-13 NOTE — Telephone Encounter (Signed)
Please see refills on your desk to be signed and faxed to [facility]. Per pt she has an appt on Friday and can't afford to come in because she needs to pay $125.00 for the visit, is there anyway she could come anyway? Pt needs form signed so all her meds can be accepted by [facility] and pt needs to discuss other issues with Dr.Letvak. Please advise

## 2012-08-14 NOTE — Telephone Encounter (Signed)
Spoke with patient and she states she will do her best to come up with $60.00, pt states she can make payments also.

## 2012-08-14 NOTE — Telephone Encounter (Signed)
Let her know that I will charge the minimum charge--- I think it is about $60

## 2012-08-15 ENCOUNTER — Encounter: Payer: Self-pay | Admitting: Internal Medicine

## 2012-08-15 ENCOUNTER — Ambulatory Visit (INDEPENDENT_AMBULATORY_CARE_PROVIDER_SITE_OTHER): Payer: Self-pay | Admitting: Internal Medicine

## 2012-08-15 VITALS — BP 138/70 | HR 83 | Temp 97.8°F | Wt 255.0 lb

## 2012-08-15 DIAGNOSIS — E785 Hyperlipidemia, unspecified: Secondary | ICD-10-CM

## 2012-08-15 DIAGNOSIS — I10 Essential (primary) hypertension: Secondary | ICD-10-CM

## 2012-08-15 DIAGNOSIS — F3289 Other specified depressive episodes: Secondary | ICD-10-CM

## 2012-08-15 DIAGNOSIS — E1149 Type 2 diabetes mellitus with other diabetic neurological complication: Secondary | ICD-10-CM

## 2012-08-15 DIAGNOSIS — F329 Major depressive disorder, single episode, unspecified: Secondary | ICD-10-CM

## 2012-08-15 NOTE — Assessment & Plan Note (Signed)
BP Readings from Last 3 Encounters:  08/15/12 138/70  02/11/12 140/80  08/07/11 150/80   Control is fine No changes needed

## 2012-08-15 NOTE — Assessment & Plan Note (Signed)
Grieving grandson's suicide Continues on the meds

## 2012-08-15 NOTE — Assessment & Plan Note (Signed)
Continues on meds Labs when we can

## 2012-08-15 NOTE — Patient Instructions (Addendum)
Please call if you are approved for the [facility] reduced payment program. I will then order labwork

## 2012-08-15 NOTE — Progress Notes (Signed)
Subjective:    Patient ID: Anita Small, female    DOB: 06/03/1951, 62 y.o.   MRN: [id number]  HPI Now without insurance  Probably until she can go on Medicare Getting some of the inexpensive meds and now working through [employer] to get back on the insulin  Grandson committed suicide 23 and had child and stepchild Let another grandson, girlfriend and baby move in with her in the fall They lied to her and didn't pay her---now holding his things "hostage" Still on the mood meds Still feels depressed but feels that is grieving for dead grandson  Has checked sugars occ 95-264 fasting Hopes to get back on levemir soon (has been using samples of lantus though)  No chest pain No SOB  Following [employer] now  Current Outpatient Prescriptions on File Prior to Visit  Medication Sig Dispense Refill  . aspirin 81 MG tablet Take 81 mg by mouth daily.      . cetirizine (ZYRTEC) 10 MG tablet Take 0.5 tablets (5 mg total) by mouth daily.  90 tablet  3  . citalopram (CELEXA) 20 MG tablet Take 1 tablet (20 mg total) by mouth daily.  90 tablet  3  . estradiol (ESTRACE) 0.1 MG/GM vaginal cream Apply 1/4  Applicator full two times per week  42.5 g  1  . glipiZIDE (GLUCOTROL) 5 MG tablet Take 1 tablet (5 mg total) by mouth 2 (two) times daily before a meal.  180 tablet  3  . glucose blood (RELION GLUCOSE TEST STRIPS) test strip 1 each. Use as instructed (3 times a day)       . HYDROcodone-acetaminophen (VICODIN) 5-500 MG per tablet Take 1 tablet by mouth every 4 (four) hours as needed.  60 tablet  0  . insulin detemir (LEVEMIR) 100 UNIT/ML injection Inject 100 Units into the skin daily. As directed  10 mL  11  . Insulin Syringe-Needle U-100 (ULTICARE INSULIN SYRINGE) 31G X 5/16" 1 ML MISC Use as directed.  100 each  3  . levothyroxine (SYNTHROID, LEVOTHROID) 100 MCG tablet Take 1 tablet (100 mcg total) by mouth daily.  90 tablet  3  . lisinopril-hydrochlorothiazide  (PRINZIDE,ZESTORETIC) 20-25 MG per tablet Take 1 tablet by mouth daily.  90 tablet  3  . loratadine (CLARITIN) 10 MG tablet Take 1 tablet (10 mg total) by mouth daily.  90 tablet  3  . metFORMIN (GLUCOPHAGE) 500 MG tablet Take 1 tablet (500 mg total) by mouth 2 (two) times daily with a meal.  180 tablet  3  . simvastatin (ZOCOR) 80 MG tablet Take 0.5 tablets (40 mg total) by mouth at bedtime.  90 tablet  3  . traZODone (DESYREL) 100 MG tablet Take 1-2 tablets (100-200 mg total) by mouth at bedtime as needed.  180 tablet  3   Current Facility-Administered Medications on File Prior to Visit  Medication Dose Route Frequency Provider Last Rate Last Dose  . 0.9 %  sodium chloride infusion  500 mL Intravenous Continuous Meryl Dare, MD,FACG        Allergies  Allergen Reactions  . Codeine Phosphate     REACTION: unspecified  . Duloxetine     REACTION: unspecified  . Meperidine Hcl     REACTION: unspecified  . Venlafaxine     REACTION: unspecified  . Doxycycline Rash    Past Medical History  Diagnosis Date  . Depression     Paranoid tendencies  . Diabetes mellitus  Type II  . GERD (gastroesophageal reflux disease)   . Hypertension   . Anxiety   . Arthritis     Left knee  . Adenomatous polyp 1999  . Hyperlipidemia   . Hypothyroidism   . Obesity   . Diverticulosis     Past Surgical History  Procedure Laterality Date  . Heel spur surgery  03/03    Left  . Knee arthroscopy  2001    Left   Katrinka Blazing)  . Abdominal hysterectomy  1977    One ovary left  . Bladder repair  1977    Tacking  . Cervical spine surgery  1999  . Ct abd w & pelvis wo cm  2002    Negative. Pituitary normal on MRI  . Carotid endarterectomy  2/11    Dr Evette Cristal  . Cardiovascular stress test  7/00    negative. No cardiolite  . Transthoracic echocardiogram  3/04    normal  . Appendectomy    . Cholecystectomy    . Total knee arthroplasty  11/12    left--Dr Katrinka Blazing    Family History  Problem  Relation Age of Onset  . Cancer Mother     Breast  . Heart disease Mother   . Diabetes Mother   . Heart disease Father   . Diabetes Father   . Hypertension Sister   . Cancer Paternal Grandmother     ? throat CA  . Depression Daughter   . Colon cancer      Unsure if mother had it   . Colon polyps Mother     History   Social History  . Marital Status: Widowed    Spouse Name: N/A    Number of Children: 3  . Years of Education: N/A   Occupational History  . Receptionist---retired     [employer]  .     [employer]      Social History Main Topics  . Smoking status: Former Smoker    Types: Cigarettes  . Smokeless tobacco: Never Used  . Alcohol Use: No  . Drug Use: No  . Sexually Active: Not on file   Other Topics Concern  . Not on file   Social History Narrative   Widowed 07/02.  Kids have moved out and now living with friend to share expenses.      3 caffeine drinks daily    Review of Systems Not sleeping great Nocturia and thinking about grandson Appetite is fair Weight up slightly since last visit    Objective:   Physical Exam  Constitutional: She appears well-developed and well-nourished. No distress.  Neck: Normal range of motion. Neck supple.  Cardiovascular: Normal rate, regular rhythm, normal heart sounds and intact distal pulses.  Exam reveals no gallop.   No murmur heard. Faint pedal pulses  Pulmonary/Chest: Effort normal and breath sounds normal. No respiratory distress. She has no wheezes. She has no rales.  Abdominal: Soft. There is no tenderness.  Musculoskeletal: She exhibits no edema and no tenderness.  Lymphadenopathy:    She has no cervical adenopathy.  Psychiatric: She has a normal mood and affect. Her behavior is normal.          Assessment & Plan:

## 2012-08-15 NOTE — Assessment & Plan Note (Signed)
Hopefully better control Will check labs if she qualifies for [facility] support

## 2012-10-03 ENCOUNTER — Telehealth: Payer: Self-pay | Admitting: Family Medicine

## 2012-10-03 MED ORDER — FLUCONAZOLE 150 MG PO TABS: ORAL_TABLET | ORAL | Status: DC

## 2012-10-03 NOTE — Telephone Encounter (Signed)
Pt requesting Diflucan for yeast infection.  Also wants to know if Premarin can be substituted for Estrace.

## 2012-10-03 NOTE — Telephone Encounter (Signed)
Okay to send fluconazole 150mg  #2 x 0 Take one now for yeast infection and repeat in 1 week if needed  I can switch to premarin cream but most insurances don't cover it because it is not generic and usually costs a lot more

## 2012-10-03 NOTE — Telephone Encounter (Signed)
Patient advised.  Wants Rx sent to [facility] on [address]. Sent.    Patient says she doesn't need the Premarin Cream to be generic because she gets her Rx's from [facility] and they said she just needed you to send a Rx over there.

## 2012-10-03 NOTE — Telephone Encounter (Signed)
Left message to have pt return my call, need to know which pharmacy to send rx to.

## 2012-10-04 NOTE — Telephone Encounter (Signed)
You can change to the premarin cream then with the same directions

## 2012-10-06 MED ORDER — ESTROGENS, CONJUGATED 0.625 MG/GM VA CREA: 1.0000 g | TOPICAL_CREAM | VAGINAL | Status: DC

## 2012-10-06 NOTE — Telephone Encounter (Signed)
rx sent to pharmacy by fax/ [facility]

## 2012-10-06 NOTE — Addendum Note (Signed)
Addended by: Sueanne Margarita on: 10/06/2012 11:19 AM   Modules accepted: Orders, Medications

## 2012-10-21 ENCOUNTER — Other Ambulatory Visit: Payer: Self-pay | Admitting: *Deleted

## 2012-10-21 MED ORDER — ESTROGENS, CONJUGATED 0.625 MG/GM VA CREA: 1.0000 g | TOPICAL_CREAM | VAGINAL | Status: DC

## 2012-10-21 MED ORDER — FLUCONAZOLE 150 MG PO TABS: ORAL_TABLET | ORAL | Status: DC

## 2012-10-24 NOTE — Telephone Encounter (Signed)
Yes, would make the substitution.  Would take 100mg  now for yeast infection and repeat in 1 week if needed.  Thanks.

## 2012-10-24 NOTE — Telephone Encounter (Signed)
LMOVM

## 2012-10-24 NOTE — Telephone Encounter (Signed)
Rita with [facility] said only has Diflucan 100mg ; can that be substituted for Diflucan 150 mg.Dr Alphonsus Sias not on computer.Please advise.

## 2012-11-27 ENCOUNTER — Other Ambulatory Visit: Payer: Self-pay

## 2012-11-27 NOTE — Telephone Encounter (Signed)
Pt left v/m requesting refill on lorazepam to [address] . Pt said she is down in the dumps and trying to climb out and thinks Lorazepam will help. Please advise.

## 2012-11-28 MED ORDER — LORAZEPAM 0.5 MG PO TABS: 0.2500 mg | ORAL_TABLET | Freq: Two times a day (BID) | ORAL | Status: AC | PRN

## 2012-11-28 NOTE — Telephone Encounter (Signed)
Okay #30 x 0 Remind her that lorazepam is not an antidepressant--it is for anxiety and nerves  If she stays down, have her set up an appt

## 2012-11-28 NOTE — Telephone Encounter (Signed)
rx called into pharmacy .left message to have patient return my call.  

## 2013-02-04 LAB — HM DIABETES EYE EXAM

## 2013-02-06 ENCOUNTER — Other Ambulatory Visit: Payer: Self-pay | Admitting: Internal Medicine

## 2013-02-07 NOTE — Telephone Encounter (Signed)
Okay #30 x 0 

## 2013-02-09 NOTE — Telephone Encounter (Signed)
rx called into pharmacy

## 2013-02-13 ENCOUNTER — Ambulatory Visit: Payer: Self-pay | Admitting: Internal Medicine

## 2013-03-04 ENCOUNTER — Encounter: Payer: Self-pay | Admitting: Radiology

## 2013-03-05 ENCOUNTER — Ambulatory Visit (INDEPENDENT_AMBULATORY_CARE_PROVIDER_SITE_OTHER): Payer: Self-pay | Admitting: Internal Medicine

## 2013-03-05 ENCOUNTER — Encounter: Payer: Self-pay | Admitting: Internal Medicine

## 2013-03-05 VITALS — BP 130/70 | HR 95 | Temp 98.2°F | Wt 263.0 lb

## 2013-03-05 DIAGNOSIS — F411 Generalized anxiety disorder: Secondary | ICD-10-CM

## 2013-03-05 DIAGNOSIS — I10 Essential (primary) hypertension: Secondary | ICD-10-CM

## 2013-03-05 DIAGNOSIS — E1149 Type 2 diabetes mellitus with other diabetic neurological complication: Secondary | ICD-10-CM

## 2013-03-05 DIAGNOSIS — E785 Hyperlipidemia, unspecified: Secondary | ICD-10-CM

## 2013-03-05 LAB — HM DIABETES FOOT EXAM

## 2013-03-05 NOTE — Assessment & Plan Note (Signed)
Tolerating meds Labs again when some insurance

## 2013-03-05 NOTE — Assessment & Plan Note (Signed)
Clearly out of control No money to check labs She must get it back under control

## 2013-03-05 NOTE — Assessment & Plan Note (Signed)
Ongoing stress On lorazepam daily

## 2013-03-05 NOTE — Patient Instructions (Addendum)
Increase the levemir to 60 units twice a day  If you do not qualify for the [facility] reduced payment plan, please go to the [facility] till you go on Medicare (and then you can come back here)

## 2013-03-05 NOTE — Progress Notes (Signed)
Subjective:    Patient ID: Anita Small, female    DOB: Nov 02, 1950, 62 y.o.   MRN: [id number]  HPI Still with no insurance Is getting all her meds from [facility] other than controlled substances Can't afford blood work  Now has SIL and grandson with her Lots of drama with grandson Mood still not good---tries to pray but still has depressed times Daughter drinking again--back in rehab Uses the lorazepam in the morning and tries not to take any more  Uses the hydrocodone rarely Mostly for knee pain Uses 2 aleve also   Checks sugars daily ---fasting Often 200 or higher Regular with meds and her insulin  No chest pain No SOB  Current Outpatient Prescriptions on File Prior to Visit  Medication Sig Dispense Refill  . aspirin 81 MG tablet Take 81 mg by mouth daily.      . citalopram (CELEXA) 20 MG tablet Take 1 tablet (20 mg total) by mouth daily.  90 tablet  3  . conjugated estrogens (PREMARIN) vaginal cream Place 0.5 Applicatorfuls vaginally 2 (two) times a week.  42.5 g  11  . glipiZIDE (GLUCOTROL) 5 MG tablet Take 1 tablet (5 mg total) by mouth 2 (two) times daily before a meal.  180 tablet  3  . glucose blood (RELION GLUCOSE TEST STRIPS) test strip 1 each. Use as instructed (3 times a day)       . HYDROcodone-acetaminophen (VICODIN) 5-500 MG per tablet Take 1 tablet by mouth every 4 (four) hours as needed.  60 tablet  0  . Insulin Syringe-Needle U-100 (ULTICARE INSULIN SYRINGE) 31G X 5/16" 1 ML MISC Use as directed.  100 each  3  . levothyroxine (SYNTHROID, LEVOTHROID) 100 MCG tablet Take 1 tablet (100 mcg total) by mouth daily.  90 tablet  3  . lisinopril-hydrochlorothiazide (PRINZIDE,ZESTORETIC) 20-25 MG per tablet Take 1 tablet by mouth daily.  90 tablet  3  . loratadine (CLARITIN) 10 MG tablet Take 1 tablet (10 mg total) by mouth daily.  90 tablet  3  . LORazepam (ATIVAN) 0.5 MG tablet TAKE ONE-HALF TO ONE TABLET BY MOUTH TWICE DAILY AS NEEDED FOR ANXIETY  30 tablet  0  .  metFORMIN (GLUCOPHAGE) 500 MG tablet Take 1 tablet (500 mg total) by mouth 2 (two) times daily with a meal.  180 tablet  3  . simvastatin (ZOCOR) 80 MG tablet Take 0.5 tablets (40 mg total) by mouth at bedtime.  90 tablet  3  . traZODone (DESYREL) 100 MG tablet Take 1-2 tablets (100-200 mg total) by mouth at bedtime as needed.  180 tablet  3   No current facility-administered medications on file prior to visit.    Allergies  Allergen Reactions  . Codeine Phosphate     REACTION: unspecified  . Duloxetine     REACTION: unspecified  . Meperidine Hcl     REACTION: unspecified  . Venlafaxine     REACTION: unspecified  . Doxycycline Rash    Past Medical History  Diagnosis Date  . Depression     Paranoid tendencies  . Diabetes mellitus     Type II  . GERD (gastroesophageal reflux disease)   . Hypertension   . Anxiety   . Arthritis     Left knee  . Adenomatous polyp 1999  . Hyperlipidemia   . Hypothyroidism   . Obesity   . Diverticulosis     Past Surgical History  Procedure Laterality Date  . Heel spur surgery  03/03  Left  . Knee arthroscopy  2001    Left   Katrinka Blazing)  . Abdominal hysterectomy  1977    One ovary left  . Bladder repair  1977    Tacking  . Cervical spine surgery  1999  . Ct abd w & pelvis wo cm  2002    Negative. Pituitary normal on MRI  . Carotid endarterectomy  2/11    Dr Evette Cristal  . Cardiovascular stress test  7/00    negative. No cardiolite  . Transthoracic echocardiogram  3/04    normal  . Appendectomy    . Cholecystectomy    . Total knee arthroplasty  11/12    left--Dr Katrinka Blazing    Family History  Problem Relation Age of Onset  . Cancer Mother     Breast  . Heart disease Mother   . Diabetes Mother   . Heart disease Father   . Diabetes Father   . Hypertension Sister   . Cancer Paternal Grandmother     ? throat CA  . Depression Daughter   . Colon cancer      Unsure if mother had it   . Colon polyps Mother     History   Social  History  . Marital Status: Widowed    Spouse Name: N/A    Number of Children: 3  . Years of Education: N/A   Occupational History  . Receptionist---retired     [employer]  .     [employer]      Social History Main Topics  . Smoking status: Former Smoker    Types: Cigarettes  . Smokeless tobacco: Never Used  . Alcohol Use: No  . Drug Use: No  . Sexual Activity: Not on file   Other Topics Concern  . Not on file   Social History Narrative   Widowed 07/02.  Kids have moved out and now living with friend to share expenses.      3 caffeine drinks daily    Review of Systems Fell the other day---tripped over shoes. Needed help to get up Weight is up 8# since last visit--discussed No longer on the carb reduced diet    Objective:   Physical Exam  Constitutional: She appears well-developed. No distress.  Neck: Normal range of motion. Neck supple. No thyromegaly present.  Cardiovascular: Normal rate, regular rhythm, normal heart sounds and intact distal pulses.  Exam reveals no gallop.   No murmur heard. Pulmonary/Chest: Effort normal and breath sounds normal. No respiratory distress. She has no wheezes. She has no rales.  Musculoskeletal: She exhibits no edema.  Lymphadenopathy:    She has no cervical adenopathy.  Neurological:  Slight decreased sensation in feet  Skin:  Mild plantar callous without ulcers in feet          Assessment & Plan:

## 2013-03-05 NOTE — Assessment & Plan Note (Signed)
BP Readings from Last 3 Encounters:  03/05/13 130/70  08/15/12 138/70  02/11/12 140/80   Still under control No change needed

## 2013-03-16 ENCOUNTER — Other Ambulatory Visit: Payer: Self-pay | Admitting: Internal Medicine

## 2013-03-16 NOTE — Telephone Encounter (Signed)
Already called in today 

## 2013-03-16 NOTE — Telephone Encounter (Signed)
rx called into pharmacy

## 2013-03-16 NOTE — Telephone Encounter (Signed)
Okay #30 x 0 

## 2013-03-17 ENCOUNTER — Other Ambulatory Visit: Payer: Self-pay | Admitting: *Deleted

## 2013-03-18 MED ORDER — HYDROCODONE-ACETAMINOPHEN 5-325 MG PO TABS: 1.0000 | ORAL_TABLET | ORAL | Status: DC | PRN

## 2013-03-18 NOTE — Telephone Encounter (Signed)
Changed rx from 5-500( no longer being made) to 5-325 rx called into pharmacy

## 2013-03-18 NOTE — Telephone Encounter (Signed)
Okay #60 x 0 

## 2013-03-30 DIAGNOSIS — I2510 Atherosclerotic heart disease of native coronary artery without angina pectoris: Secondary | ICD-10-CM | POA: Insufficient documentation

## 2013-05-13 ENCOUNTER — Other Ambulatory Visit: Payer: Self-pay | Admitting: *Deleted

## 2013-05-13 MED ORDER — LEVOTHYROXINE SODIUM 100 MCG PO TABS: 100.0000 ug | ORAL_TABLET | Freq: Every day | ORAL | Status: DC

## 2013-05-13 NOTE — Telephone Encounter (Signed)
rx called into pharmacy

## 2013-06-03 ENCOUNTER — Ambulatory Visit: Payer: Self-pay | Admitting: Family Medicine

## 2013-06-25 ENCOUNTER — Ambulatory Visit: Payer: Self-pay | Admitting: Family Medicine

## 2013-07-10 ENCOUNTER — Other Ambulatory Visit: Payer: Self-pay | Admitting: *Deleted

## 2013-07-10 MED ORDER — INSULIN DETEMIR 100 UNIT/ML ~~LOC~~ SOLN: 60.0000 [IU] | Freq: Two times a day (BID) | SUBCUTANEOUS | Status: DC

## 2013-07-26 ENCOUNTER — Ambulatory Visit: Payer: Self-pay | Admitting: Family Medicine

## 2013-08-07 ENCOUNTER — Other Ambulatory Visit: Payer: Self-pay | Admitting: Family Medicine

## 2013-08-07 MED ORDER — CITALOPRAM HYDROBROMIDE 20 MG PO TABS: 20.0000 mg | ORAL_TABLET | Freq: Every day | ORAL | Status: DC

## 2013-08-07 NOTE — Telephone Encounter (Signed)
rx called into pharmacy

## 2013-08-07 NOTE — Telephone Encounter (Signed)
Okay to refill for a year 

## 2013-08-12 ENCOUNTER — Other Ambulatory Visit: Payer: Self-pay | Admitting: *Deleted

## 2013-08-12 MED ORDER — LORATADINE 10 MG PO TABS: 10.0000 mg | ORAL_TABLET | Freq: Every day | ORAL | Status: DC

## 2013-08-14 ENCOUNTER — Other Ambulatory Visit: Payer: Self-pay | Admitting: *Deleted

## 2013-08-14 MED ORDER — TRAZODONE HCL 100 MG PO TABS: 100.0000 mg | ORAL_TABLET | Freq: Every evening | ORAL | Status: DC | PRN

## 2013-08-14 MED ORDER — SIMVASTATIN 80 MG PO TABS: 40.0000 mg | ORAL_TABLET | Freq: Every day | ORAL | Status: DC

## 2013-08-14 NOTE — Telephone Encounter (Signed)
rx faxed to pharmacy manually  

## 2013-09-15 ENCOUNTER — Other Ambulatory Visit: Payer: Self-pay | Admitting: *Deleted

## 2013-09-15 MED ORDER — LISINOPRIL 20 MG PO TABS: 20.0000 mg | ORAL_TABLET | Freq: Every day | ORAL | Status: DC

## 2013-09-15 MED ORDER — METFORMIN HCL 500 MG PO TABS: 500.0000 mg | ORAL_TABLET | Freq: Two times a day (BID) | ORAL | Status: DC

## 2013-09-15 MED ORDER — HYDROCHLOROTHIAZIDE 25 MG PO TABS: 25.0000 mg | ORAL_TABLET | Freq: Every day | ORAL | Status: DC

## 2013-09-15 NOTE — Telephone Encounter (Signed)
Rx's phoned in to [facility].

## 2013-09-23 LAB — TSH: TSH: 2.31 u[IU]/mL (ref 0.41–5.90)

## 2013-10-26 ENCOUNTER — Ambulatory Visit: Payer: Self-pay | Admitting: Urology

## 2013-11-23 LAB — HM DIABETES EYE EXAM

## 2013-11-26 ENCOUNTER — Telehealth: Payer: Self-pay | Admitting: Internal Medicine

## 2013-11-26 NOTE — Telephone Encounter (Signed)
[facility] returned your call.

## 2013-11-26 NOTE — Telephone Encounter (Signed)
This has been handled.

## 2014-01-01 ENCOUNTER — Telehealth: Payer: Self-pay | Admitting: Family Medicine

## 2014-01-01 NOTE — Telephone Encounter (Signed)
Diabetic Bundle.  Pt needs f/u appt to see Dr. Silvio Pate and check A1C, and LDL.  Left vm to return call.

## 2014-01-07 LAB — LIPID PANEL
CHOLESTEROL: 148 mg/dL (ref 0–200)
HDL: 34 mg/dL — AB (ref 35–70)
LDL Cholesterol: 75 mg/dL

## 2014-01-07 LAB — CBC AND DIFFERENTIAL
Neutrophils Absolute: 4 /uL
WBC: 6.5 10^3/mL

## 2014-01-07 LAB — HEMOGLOBIN A1C: HEMOGLOBIN A1C: 10.7 % — AB (ref 4.0–6.0)

## 2014-01-07 LAB — BASIC METABOLIC PANEL
BUN: 11 mg/dL (ref 4–21)
Creatinine: 0.6 mg/dL (ref <=1.1)
GLUCOSE: 108 mg/dL
SODIUM: 141 mmol/L (ref 137–147)

## 2014-01-07 LAB — HEPATIC FUNCTION PANEL
BILIRUBIN DIRECT: 0.08 mg/dL (ref 0.01–0.4)
BILIRUBIN, TOTAL: 0.2 mg/dL

## 2014-02-23 ENCOUNTER — Ambulatory Visit (INDEPENDENT_AMBULATORY_CARE_PROVIDER_SITE_OTHER): Payer: Commercial Managed Care - HMO | Admitting: Internal Medicine

## 2014-02-23 ENCOUNTER — Encounter: Payer: Self-pay | Admitting: Internal Medicine

## 2014-02-23 ENCOUNTER — Ambulatory Visit: Payer: Self-pay | Admitting: Internal Medicine

## 2014-02-23 VITALS — BP 140/64 | HR 68 | Temp 97.5°F | Ht 62.0 in | Wt 252.0 lb

## 2014-02-23 DIAGNOSIS — I10 Essential (primary) hypertension: Secondary | ICD-10-CM

## 2014-02-23 DIAGNOSIS — F39 Unspecified mood [affective] disorder: Secondary | ICD-10-CM

## 2014-02-23 DIAGNOSIS — M159 Polyosteoarthritis, unspecified: Secondary | ICD-10-CM

## 2014-02-23 DIAGNOSIS — E785 Hyperlipidemia, unspecified: Secondary | ICD-10-CM

## 2014-02-23 DIAGNOSIS — E1165 Type 2 diabetes mellitus with hyperglycemia: Principal | ICD-10-CM

## 2014-02-23 DIAGNOSIS — Z23 Encounter for immunization: Secondary | ICD-10-CM

## 2014-02-23 DIAGNOSIS — IMO0001 Reserved for inherently not codable concepts without codable children: Secondary | ICD-10-CM

## 2014-02-23 DIAGNOSIS — M150 Primary generalized (osteo)arthritis: Secondary | ICD-10-CM

## 2014-02-23 LAB — HM DIABETES FOOT EXAM

## 2014-02-23 MED ORDER — HYDROCODONE-ACETAMINOPHEN 5-325 MG PO TABS: 1.0000 | ORAL_TABLET | Freq: Three times a day (TID) | ORAL | Status: DC | PRN

## 2014-02-23 NOTE — Progress Notes (Signed)
Pre visit review using our clinic review tool, if applicable. No additional management support is needed unless otherwise documented below in the visit note. 

## 2014-02-23 NOTE — Assessment & Plan Note (Signed)
Still with stress--esp financial Isolated Continues on the med

## 2014-02-23 NOTE — Assessment & Plan Note (Signed)
Lab Results  Component Value Date   LDLCALC 75 01/07/2014   Good control

## 2014-02-23 NOTE — Assessment & Plan Note (Signed)
Uses aleve and this helps Hydrocodone for emergency use

## 2014-02-23 NOTE — Assessment & Plan Note (Signed)
BP Readings from Last 3 Encounters:  02/23/14 140/64  03/05/13 130/70  08/15/12 138/70   Fair control No changes

## 2014-02-23 NOTE — Progress Notes (Signed)
Subjective:    Patient ID: Anita Small, female    DOB: 08-Jun-1951, 63 y.o.   MRN: [id number]  HPI Has been going to [facility] Now has insurance  Ongoing knee pain Doesn't want to have surgery Hasn't had the hydrocodone---would just use it rarely  Mood has been "terrible" Ex-SIL living with her--- got arrested 2 weeks ago Didn't pay her, etc Now back to living alone--with 2 dogs and 2 cats Catches herself tensing her hands---still with anxiety Enjoys computer games and TV Doesn't get out much  Reviewed her last labs A1c out of control Now trying to eat just veggies for the most part-- some meat though Has lost some weight since last year Reminded her that she did very well on low carb diet  Tests sugars daily, bid or tid Not adjusting her insulin at all now  No chest pain Gets DOE at times Has ventolin inhaler for prn--- this helps  Sleeps okay other than nocturia  Current Outpatient Prescriptions on File Prior to Visit  Medication Sig Dispense Refill  . aspirin 81 MG tablet Take 81 mg by mouth daily.      . citalopram (CELEXA) 20 MG tablet Take 1 tablet (20 mg total) by mouth daily.  90 tablet  3  . glucose blood (RELION GLUCOSE TEST STRIPS) test strip 1 each. Use as instructed (3 times a day)       . hydrochlorothiazide (HYDRODIURIL) 25 MG tablet Take 1 tablet (25 mg total) by mouth daily. Take everyday with Lisinopril  90 tablet  1  . HYDROcodone-acetaminophen (NORCO/VICODIN) 5-325 MG per tablet Take 1 tablet by mouth every 4 (four) hours as needed for pain.  60 tablet  0  . Insulin Syringe-Needle U-100 (ULTICARE INSULIN SYRINGE) 31G X 5/16" 1 ML MISC Use as directed.  100 each  3  . levothyroxine (SYNTHROID, LEVOTHROID) 100 MCG tablet Take 1 tablet (100 mcg total) by mouth daily.  90 tablet  3  . lisinopril (PRINIVIL,ZESTRIL) 20 MG tablet Take 1 tablet (20 mg total) by mouth daily. Take everyday with HCTZ  90 tablet  1  . loratadine (CLARITIN) 10 MG  tablet Take 1 tablet (10 mg total) by mouth daily.  90 tablet  3  . LORazepam (ATIVAN) 0.5 MG tablet TAKE ONE-HALF TO ONE TABLET BY MOUTH TWICE DAILY AS NEEDED FOR ANXIETY  30 tablet  0  . simvastatin (ZOCOR) 80 MG tablet Take 0.5 tablets (40 mg total) by mouth at bedtime.  90 tablet  3  . traZODone (DESYREL) 100 MG tablet Take 1-2 tablets (100-200 mg total) by mouth at bedtime as needed.  180 tablet  3   No current facility-administered medications on file prior to visit.    Allergies  Allergen Reactions  . Codeine Phosphate     REACTION: unspecified  . Duloxetine     REACTION: unspecified  . Meperidine Hcl     REACTION: unspecified  . Venlafaxine     REACTION: unspecified  . Doxycycline Rash    Past Medical History  Diagnosis Date  . Depression     Paranoid tendencies  . Diabetes mellitus     Type II  . GERD (gastroesophageal reflux disease)   . Hypertension   . Anxiety   . Arthritis     Left knee  . Adenomatous polyp 1999  . Hyperlipidemia   . Hypothyroidism   . Obesity   . Diverticulosis     Past Surgical History  Procedure  Laterality Date  . Heel spur surgery  03/03    Left  . Knee arthroscopy  2001    Left   Tamala Julian)  . Abdominal hysterectomy  1977    One ovary left  . Bladder repair  1977    Tacking  . Cervical spine surgery  1999  . Ct abd w & pelvis wo cm  2002    Negative. Pituitary normal on MRI  . Carotid endarterectomy  2/11    Dr Jamal Collin  . Cardiovascular stress test  7/00    negative. No cardiolite  . Transthoracic echocardiogram  3/04    normal  . Appendectomy    . Cholecystectomy    . Total knee arthroplasty  11/12    left--Dr Tamala Julian    Family History  Problem Relation Age of Onset  . Cancer Mother     Breast  . Heart disease Mother   . Diabetes Mother   . Heart disease Father   . Diabetes Father   . Hypertension Sister   . Cancer Paternal Grandmother     ? throat CA  . Depression Daughter   . Colon cancer      Unsure if  mother had it   . Colon polyps Mother     History   Social History  . Marital Status: Widowed    Spouse Name: N/A    Number of Children: 3  . Years of Education: N/A   Occupational History  . 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     [employer]      Social History Main Topics  . Smoking status: Former Smoker    Types: Cigarettes  . Smokeless tobacco: Never Used  . Alcohol Use: No  . Drug Use: No  . Sexual Activity: Not on file   Other Topics Concern  . Not on file   Social History Narrative   Widowed 07/02.  Kids have moved out and now living with friend to share expenses.      3 caffeine drinks daily    Review of Systems Bowels are okay with metamucil Gets urinary urgency but not incontinent Some rash on face--nothing consistent. Stays dry but she uses cream    Objective:   Physical Exam  Constitutional: She appears well-developed. No distress.  Neck: Normal range of motion. Neck supple. No thyromegaly present.  Cardiovascular: Normal rate, regular rhythm, normal heart sounds and intact distal pulses.  Exam reveals no gallop.   No murmur heard. Pulmonary/Chest: Effort normal and breath sounds normal. No respiratory distress. She has no wheezes. She has no rales.  Abdominal: Soft. There is no tenderness.  Musculoskeletal: She exhibits no edema.  Lymphadenopathy:    She has no cervical adenopathy.  Neurological:  Normal sensation in plantar feet  Skin:  Slight plantar callous  Psychiatric: She has a normal mood and affect. Her behavior is normal.          Assessment & Plan:

## 2014-02-23 NOTE — Addendum Note (Signed)
Addended by: Lurlean Nanny on: 02/23/2014 01:47 PM   Modules accepted: Orders

## 2014-02-23 NOTE — Assessment & Plan Note (Signed)
Can't afford to eat right Did very well on Atkin's but she doesn't think she can stay on that---discussed staying low carb Lab Results  Component Value Date   HGBA1C 10.7* 01/07/2014   Recommended endocrine but she can't afford the $45 copay with her insurance

## 2014-02-25 ENCOUNTER — Other Ambulatory Visit: Payer: Self-pay | Admitting: *Deleted

## 2014-02-25 MED ORDER — CITALOPRAM HYDROBROMIDE 20 MG PO TABS: 20.0000 mg | ORAL_TABLET | Freq: Every day | ORAL | Status: DC

## 2014-02-25 MED ORDER — HYDROCHLOROTHIAZIDE 25 MG PO TABS: 25.0000 mg | ORAL_TABLET | Freq: Every day | ORAL | Status: DC

## 2014-02-25 MED ORDER — SIMVASTATIN 80 MG PO TABS: 40.0000 mg | ORAL_TABLET | Freq: Every day | ORAL | Status: DC

## 2014-02-25 MED ORDER — SITAGLIPTIN PHOS-METFORMIN HCL 50-1000 MG PO TABS: 1.0000 | ORAL_TABLET | Freq: Two times a day (BID) | ORAL | Status: DC

## 2014-02-25 MED ORDER — LEVOTHYROXINE SODIUM 100 MCG PO TABS: 100.0000 ug | ORAL_TABLET | Freq: Every day | ORAL | Status: AC

## 2014-02-25 MED ORDER — LISINOPRIL 20 MG PO TABS: 20.0000 mg | ORAL_TABLET | Freq: Every day | ORAL | Status: DC

## 2014-03-03 ENCOUNTER — Other Ambulatory Visit: Payer: Self-pay | Admitting: *Deleted

## 2014-03-03 MED ORDER — ALCOHOL SWABS PADS: MEDICATED_PAD | Status: DC

## 2014-03-03 MED ORDER — ACCU-CHEK ADVANTAGE DIABETES KIT: PACK | Status: DC

## 2014-03-03 MED ORDER — INSULIN DETEMIR 100 UNIT/ML ~~LOC~~ SOLN: 76.0000 [IU] | Freq: Two times a day (BID) | SUBCUTANEOUS | Status: DC

## 2014-03-03 MED ORDER — GLUCOSE BLOOD VI STRP: ORAL_STRIP | Status: DC

## 2014-03-03 MED ORDER — ACCU-CHEK SOFT TOUCH LANCETS MISC: Status: DC

## 2014-03-05 ENCOUNTER — Telehealth: Payer: Self-pay

## 2014-03-05 NOTE — Telephone Encounter (Signed)
Pt was insured by [facility] on 02/23/14 and pt cannot longer get assistance from [place]; pt spoke with [facility] and pt is not completely sure of reason why levemir is costing her $525.00 for 90 days but it had something to do with stages. Pt has tried another agency who could not help pt get med; pt will try to contact [place] at 888-4PPA-NOW for assistance. Pt said she had to have her med and will cb if needed.

## 2014-03-06 NOTE — Telephone Encounter (Signed)
Please check with the pharmacy We can probably switch her to NPH insulin at much less cost. I would change to the same number of units of insulin in NPH and she should take it bid still

## 2014-03-08 ENCOUNTER — Other Ambulatory Visit: Payer: Self-pay | Admitting: *Deleted

## 2014-03-08 MED ORDER — "INSULIN SYRINGE-NEEDLE U-100 31G X 5/16"" 1 ML MISC": Status: DC

## 2014-03-08 MED ORDER — INSULIN NPH (HUMAN) (ISOPHANE) 100 UNIT/ML ~~LOC~~ SUSP: 76.0000 [IU] | Freq: Two times a day (BID) | SUBCUTANEOUS | Status: DC

## 2014-03-08 NOTE — Telephone Encounter (Signed)
Spoke with patient and advised results rx sent to pharmacy by e-script Pt will call [facility] and see if they can help her, she will also check with [facility] to see what they can do

## 2014-03-12 ENCOUNTER — Telehealth: Payer: Self-pay

## 2014-03-12 NOTE — Telephone Encounter (Signed)
Pt said she needs refill on insulin syringes and new diabetic meter; pt said [employer] had faxed a request for both; pt does not know name of meter but said would be on fax. Pt request cb on 03/15/14.

## 2014-03-15 NOTE — Telephone Encounter (Signed)
Left message on machine that everything was called in on 03/03/14 and 03/08/14, it shows a confirmed receipt from [facility]. Advised pt to return my call if needed, but she should contact [employer] first.

## 2014-03-19 ENCOUNTER — Other Ambulatory Visit: Payer: Self-pay | Admitting: *Deleted

## 2014-03-19 MED ORDER — "INSULIN SYRINGE-NEEDLE U-100 31G X 5/16"" 1 ML MISC": Status: DC

## 2014-03-19 MED ORDER — ACCU-CHEK NANO SMARTVIEW W/DEVICE KIT: PACK | Status: DC

## 2014-03-24 MED ORDER — "INSULIN SYRINGE-NEEDLE U-100 31G X 5/16"" 0.5 ML MISC": Status: DC

## 2014-03-24 NOTE — Addendum Note (Signed)
Addended by: Carter Kitten on: 03/24/2014 05:43 PM   Modules accepted: Orders

## 2014-04-05 NOTE — Telephone Encounter (Signed)
Casie RN with [employer] said pt has not received test strips for testing BS. Spoke with Elmyra Ricks pharmacist at [facility] [phone] and gave 03-03-14 refills for test strips and lancets with dx E11.65. Pt should receive supplies in 7 - 10 days. Casie voiced understanding.

## 2014-04-23 ENCOUNTER — Telehealth: Payer: Self-pay

## 2014-04-23 NOTE — Telephone Encounter (Signed)
Anita Small with [facility] called to verify OK to fill hydrocodone [facility] rx written on 02/23/14; Dr Silvio Pate not in office but after reviewing pt chart and confirming Dr Silvio Pate did wriite rx on 02/23/14 and [facility] has that rx Dr Damita Dunnings said OK to fill. Anita Small voiced understanding.

## 2014-04-25 NOTE — Telephone Encounter (Signed)
Thanks

## 2014-05-27 ENCOUNTER — Ambulatory Visit: Payer: Commercial Managed Care - HMO | Admitting: Internal Medicine

## 2014-06-11 ENCOUNTER — Inpatient Hospital Stay: Payer: Self-pay | Admitting: Internal Medicine

## 2014-06-11 ENCOUNTER — Ambulatory Visit: Payer: Self-pay | Admitting: Ophthalmology

## 2014-06-11 LAB — APTT: ACTIVATED PTT: 23.9 s (ref 23.6–35.9)

## 2014-06-11 LAB — CBC WITH DIFFERENTIAL/PLATELET
BASOS PCT: 1.2 %
Basophil #: 0.1 10*3/uL (ref 0.0–0.1)
EOS PCT: 3 %
Eosinophil #: 0.2 10*3/uL (ref 0.0–0.7)
HCT: 41.3 % (ref 35.0–47.0)
HGB: 13.4 g/dL (ref 12.0–16.0)
LYMPHS ABS: 2.6 10*3/uL (ref 1.0–3.6)
Lymphocyte %: 31.8 %
MCH: 28.6 pg (ref 26.0–34.0)
MCHC: 32.3 g/dL (ref 32.0–36.0)
MCV: 89 fL (ref 80–100)
MONO ABS: 0.4 x10 3/mm (ref 0.2–0.9)
MONOS PCT: 5.5 %
Neutrophil #: 4.8 10*3/uL (ref 1.4–6.5)
Neutrophil %: 58.5 %
Platelet: 256 10*3/uL (ref 150–440)
RBC: 4.67 10*6/uL (ref 3.80–5.20)
RDW: 15.1 % — AB (ref 11.5–14.5)
WBC: 8.2 10*3/uL (ref 3.6–11.0)

## 2014-06-11 LAB — URINALYSIS, COMPLETE
Bacteria: NONE SEEN
Bilirubin,UR: NEGATIVE
Blood: NEGATIVE
Glucose,UR: NEGATIVE mg/dL (ref 0–75)
Hyaline Cast: 5
KETONE: NEGATIVE
Leukocyte Esterase: NEGATIVE
Nitrite: NEGATIVE
PH: 5 (ref 4.5–8.0)
Protein: NEGATIVE
RBC,UR: 4 /HPF (ref 0–5)
Specific Gravity: 1.038 (ref 1.003–1.030)
Squamous Epithelial: 1

## 2014-06-11 LAB — COMPREHENSIVE METABOLIC PANEL
ALK PHOS: 75 U/L
ANION GAP: 9 (ref 7–16)
Albumin: 3.4 g/dL (ref 3.4–5.0)
BILIRUBIN TOTAL: 0.2 mg/dL (ref 0.2–1.0)
BUN: 14 mg/dL (ref 7–18)
CHLORIDE: 106 mmol/L (ref 98–107)
CO2: 26 mmol/L (ref 21–32)
Calcium, Total: 8.5 mg/dL (ref 8.5–10.1)
Creatinine: 0.81 mg/dL (ref 0.60–1.30)
EGFR (African American): >60
GLUCOSE: 137 mg/dL — AB (ref 65–99)
Osmolality: 284 (ref 275–301)
Potassium: 4.5 mmol/L (ref 3.5–5.1)
SGOT(AST): 39 U/L — ABNORMAL HIGH (ref 15–37)
SGPT (ALT): 64 U/L — ABNORMAL HIGH
Sodium: 141 mmol/L (ref 136–145)
Total Protein: 6.9 g/dL (ref 6.4–8.2)

## 2014-06-11 LAB — DRUG SCREEN, URINE

## 2014-06-11 LAB — PROTIME-INR
INR: 0.9
Prothrombin Time: 11.8 secs (ref 11.5–14.7)

## 2014-06-11 LAB — TROPONIN I: Troponin-I: <0.02 ng/mL

## 2014-06-12 DIAGNOSIS — I361 Nonrheumatic tricuspid (valve) insufficiency: Secondary | ICD-10-CM

## 2014-06-12 LAB — LIPID PANEL
CHOLESTEROL: 135 mg/dL (ref 0–200)
HDL: 28 mg/dL — AB (ref 40–60)
Ldl Cholesterol, Calc: 49 mg/dL (ref 0–100)
Triglycerides: 289 mg/dL — ABNORMAL HIGH (ref 0–200)
VLDL CHOLESTEROL, CALC: 58 mg/dL — AB (ref 5–40)

## 2014-10-16 NOTE — H&P (Signed)
PATIENT NAME:  Anita Small, [place] MR#:  [id number] DATE OF BIRTH:  Apr 26, 1951  DATE OF ADMISSION:  06/11/2014  PRIMARY CARE PROVIDER: Lenard Simmer, MD.   CHIEF COMPLAINT: Blurred vision, peripheral vision loss.   HISTORY OF PRESENT ILLNESS: A 64 year old, Caucasian female patient with history of some blurred vision and black spots in her left eye and mild blurred vision in the right eye over 6 weeks. Was seen by her ophthalmologist. Suspecting homonymous hemianopsia. He ordered an MRI of the brain and today in the emergency room it shows right occipital area acute, subacute and chronic strokes. This patient is being admitted for further work up and treatment. The patient does not have any sensory or motor deficits. No dysphagia. Has not had any falls. She is not on any blood thinners. Does take simvastatin at home. No history of prior strokes. She did have a right-sided carotid endarterectomy many years back.   PAST MEDICAL HISTORY: 1. Hypertension.  2. Insulin-dependent diabetes mellitus.  3. Morbid obesity.  4. Right carotid endarterectomy.  5. Peripheral vascular disease.  6. Hyperlipidemia.  7. Hypothyroidism.  8. Depression.   PAST SURGICAL HISTORY: 1. Left total knee replacement.  2. Hysterectomy.  ALLERGIES: CODEINE.   SOCIAL HISTORY: The patient ambulates with a walker. No smoking, alcohol or drug use.   FAMILY HISTORY: Mother died in her 69s of metastatic breast cancer, had diabetes and heart disease.   REVIEW OF SYSTEMS:  CONSTITUTIONAL: No fever, fatigue, weakness.   EYES: Has blurred vision and black spots.   CARDIOVASCULAR: No chest pain, orthopnea, edema.   RESPIRATORY: No shortness of breath.   GASTROINTESTINAL: No nausea, vomiting, diarrhea.  GENITOURINARY: No dysuria, hematuria, frequency.   ENDOCRINE: No polyuria, nocturia, thyroid problems.   HEMATOLOGIC AND LYMPHATIC: No anemia, easy bruising, bleeding.   INTEGUMENTARY: No acne, rash, lesion.    MUSCULOSKELETAL: No back pain, arthritis.   NEUROLOGIC: No focal numbness, weakness.   PSYCHIATRIC: Has history of depression.   HOME MEDICATIONS: 1. Biotin 1 tablet daily.  2. Chromium 5 mcg daily.  3. Citalopram 20 mg daily.  4. Humulin-N 36 units subcutaneous 2 times a day.  5. Hydrochlorothiazide 25 mg once a day.  6. Janumet 1050 oral 2 times a day.  7. Levemir 60 unit subcutaneous 2 times a day.  8. Levothyroxine 100 mcg oral once a day.  9. Lisinopril 20 mg oral once a day.  10. Claritin 10 mg oral once a day.  11. Metformin 1000 mg oral 2 times a day.  12. Naproxen 200 mg 2 tablets once a day.  13. Niacin 1000 mg daily.  14. Simvastatin 80 mg daily.  15. Trazodone 100 mg once a day at bedtime.  16. Vitamin D3 - 5000 international units once a day.   PHYSICAL EXAMINATION: VITAL SIGNS: Temperature 97.8, pulse 75, blood pressure 150/75, saturation 96% on room air.   GENERAL: Morbidly obese, Caucasian, female patient sitting up in bed, seems comfortable, conversational, cooperative with exam.   PSYCHIATRIC: Alert and oriented x 3. Mood and affect are appropriate. Judgment intact.   HEENT: Atraumatic, normocephalic. Oral mucosa moist and pink. Extraocular movements normal. No pallor. No icterus. Pupils bilaterally equal and reactive to light.    NECK: Supple thyromegaly. No palpable lymph nodes. Trachea midline. No carotid bruit, JVD.   CARDIOVASCULAR: S1, S2, without any murmurs. Peripheral pulses 2+. No edema.   RESPIRATORY: Normal work of breathing. Clear to auscultation on both sides.   GASTROINTESTINAL: Soft abdomen, nontender. Bowel  sounds present. No hepatosplenomegaly palpable.   SKIN: Warm and dry. No petechiae, rash, ulcers.   MUSCULOSKELETAL: No joint swelling, redness, effusion of the large joints.   NEUROLOGICAL: Motor strength 5/5 in upper and lower extremities. Sensation is intact all over. Left temporal vision absent.    LYMPHATICS: No cervical  lymphadenopathy.   LABORATORY STUDIES: Glucose 137, BUN 14, creatinine 0.81, sodium 141, potassium 4.5, chloride 106, GFR greater than 60. AST, ALT, alkaline phosphatase, bilirubin normal. Troponin less than 0.02. WBC 8.8, hemoglobin 13.4, platelets of 256,000, MCV 89. INR 0.9.   DIAGNOSTIC DATA: MRI of the brain shows a small right occipital infarct with areas of subacute as well as more acute ischemia. Abnormal distal left vertebral artery flow void. May reflect occlusion or significant atherosclerotic narrowing. Telemetry reviewed and shows normal sinus rhythm.   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 The patient is not a tissue plasminogen activator candidate as her symptoms are over 24 hours. Discussed the case with Dr. Irish Elders. We are going to start the patient on aspirin, Plavix and statin. We will hold her blood pressure medications for permissive hypertension and admit for further CTA of the head and neck along with echocardiogram. Have physical therapy see the patient.  Insulin-dependent diabetes mellitus. Continue the patient's Levemir sliding scale insulin. Hold metformin as she will get contrast with CT. Continue Januvia.  Hypertension. Hold blood pressure medications for permissive hypertension.  Deep vein thrombosis prophylaxis with Lovenox.   CODE STATUS: Full code.   TIME SPENT ON THIS CASE: 50 minutes.    ____________________________ Leia Alf Chinita Schimpf, MD srs:TT D: 06/11/2014 18:02:30 ET T: 06/11/2014 21:41:24 ET JOB#: [id number]  cc: Alveta Heimlich R. Darvin Neighbours, MD, <Dictator> Lenard Simmer, MD Neita Carp MD ELECTRONICALLY SIGNED 06/13/2014 17:08

## 2014-10-16 NOTE — Consult Note (Signed)
PATIENT NAME:  Anita Small, Anita Small MR#:  [id number] DATE OF BIRTH:  [birth year]  DATE OF CONSULTATION:  06/11/2014  REFERRING PHYSICIAN:     Emergency Department CONSULTING PHYSICIAN:  Leotis Pain, MD  REASON FOR CONSULTATION: A 6-week history of left homonymous hemianopsia.   HISTORY OF PRESENT ILLNESS: A 64 year old female with a 6-week history of decreased peripheral vision on the left side. The patient is status post seeing ophthalmology. Ophthalmology told her to have imaging done. She came to the hospital. MRI was done. The patient has subacute and acute strokes in the right occipital region. She also has decreased flow in the left vertebral, likely right vertebral dominant. The patient was on aspirin 81 mg prior, but states she discontinued it for a prolonged period of time and started back about 1 to 2 weeks ago. Currently no other complaints; just complains of left homonymous hemianopsia.   DIAGNOSTIC DATA: Imaging: As described above. Laboratory workup has been reviewed.   REVIEW OF SYSTEMS: No shortness of breath. No chest pain. No abdominal pain. No heat or cold intolerance. No diarrhea. No constipation. No weakness on one side of the body compared to the other. No anxiety. No depression.   PHYSICAL EXAMINATION:  VITAL SIGNS: Reviewed.  NEUROLOGIC: Pupils are reactive bilaterally. Left homonymous hemianopsia clearly observed. Facial sensation intact. Facial motor is intact. Tongue is midline. Uvula elevates symmetrically. Shoulder shrug intact. Motor strength is 5/5 bilaterally in upper and lower extremities. Reflexes diminished. Coordination: Finger-to-nose intact. Gait not assessed.   IMPRESSION: A 64 year old female with a 6-week history of left homonymous hemianopsia. MRI imaging: Right occipital infarcts that are subacute and acute on chronic infarcts.   PLAN: I suspect the patient has decreased flow through the posterior circulation with right vertebral artery dominant. She  likely has decreased flow in the right PCA area. I would obtain CTA head and neck. If there is significant stenosis in the posterior circulation in the right PCA, instead of just starting aspirin 325 mg, I would put the patient on dual antiplatelet therapy for 90 days that includes aspirin 81 mg and Plavix 75 mg. After the above is completed, would have physical therapy observe the patient, after which I think she will be safe for discharge and follow up as an outpatient injury. Agree with also obtaining a 2-D echocardiogram, lipid panel, hemoglobin A1c. This case was discussed with the patient, Emergency Room staff, and primary physician.    ____________________________ Leotis Pain, MD yz:ts D: 06/11/2014 17:04:20 ET T: 06/11/2014 17:38:30 ET JOB#: [id number]  cc: Leotis Pain, MD, <Dictator> Leotis Pain MD ELECTRONICALLY SIGNED 06/23/2014 13:49

## 2014-10-20 NOTE — Discharge Summary (Signed)
PATIENT NAME:  Anita Small, MCNEELY MR#:  [id number] DATE OF BIRTH:  10/19/1950  DATE OF ADMISSION:  06/11/2014 DATE OF DISCHARGE:  06/12/2014  DISCHARGE DIAGNOSES:  1.  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  2.  Occluded vertebral arteries with collaterals.  3.  Left carotid stenosis less than 70%.  4.  Diabetes mellitus.  5.  Hypertension.  6.  Obesity.   DISCHARGE MEDICATIONS:  1.  Aspirin 81 mg daily. 2.  Plavix 75 mg daily 20 mg daily.  3.  Levothyroxine 100 mcg daily.  4.  Naproxen sodium 20 mg 2 tablets daily.  5.  Trazodone 100 mg 1 or 2 tablets once a day.  6.  Hydrochlorothiazide 25 mg daily. 7.  Lisinopril 20 mg daily.  8.  Claritin 10 mg daily.  9.  Biotin 1 tablet daily.  10.  Janumet 1000-50 mg oral 2 times a day.  11.  Humulin recombinant 76 units subcutaneous 2 times a day.  12.  Simvastatin 80 mg 1/2 tablet daily.   DISCHARGE INSTRUCTIONS: Carbohydrate -controlled low-fat diet. Activity as tolerated. Follow up with primary care physician in 1 to 2 weeks.   IMAGING STUDIES DONE: Include an MRI which showed acute to subacute right occipital area CVA along with old strokes bilaterally.   CTA of the head shows bilateral vertebral artery stenosis with good collateral flow.   CONSULTATIONS:  Dr. Leotis Pain with neurology.   ADMITTING HISTORY AND PHYSICAL AND HOSPITAL COURSE: Please see detailed H and P dictated previously.  In brief, a 64 year old female patient with history of diabetes and hypertension was brought into the hospital from her ophthalmologist office after being found to have left homonymous hemianopsia. MRI showed acute strokes, was admitted for further work-up. The patient had an echo which showed no PFO or source of thrombus. The patient had a CTA which showed bilateral vertebral occlusion with good collaterals. She was seen by neurology who recommended optimizing medical management. No interventions were  advised.  The patient by the day of discharge has no focal neurological deficits other than the left homonymous hemianopsia, normal speech, and motor strength 5/5.  Lung sounds clear and is being discharged home in a stable condition to follow up with her PCP.   TIME SPENT:  On day of discharge in discharge activity was 42 minutes.     [place]  ____________________________ Leia Alf Louie Flenner, MD srs:at D: 06/16/2014 15:44:01 ET T: 06/16/2014 16:26:21 ET JOB#: [id number]  cc: Alveta Heimlich R. Darvin Neighbours, MD, <Dictator> Lenard Simmer, MD Neita Carp MD ELECTRONICALLY SIGNED 07/09/2014 12:43

## 2014-11-05 ENCOUNTER — Telehealth: Payer: Self-pay | Admitting: *Deleted

## 2014-11-05 NOTE — Telephone Encounter (Signed)
Called patient to schedule screening mammogram.  No answer.  Will attempt to call back later. 

## 2014-12-15 ENCOUNTER — Telehealth: Payer: Self-pay

## 2014-12-15 NOTE — Telephone Encounter (Signed)
Diabetic Bundle. Left voicemail advising pt her A1C blood test is due. Pt advised to contact PCP's office to schedule.  

## 2015-02-21 ENCOUNTER — Encounter: Payer: Self-pay | Admitting: Internal Medicine

## 2015-02-21 ENCOUNTER — Other Ambulatory Visit: Payer: Self-pay | Admitting: Endocrinology

## 2015-02-21 DIAGNOSIS — I051 Rheumatic mitral insufficiency: Secondary | ICD-10-CM

## 2015-03-02 ENCOUNTER — Ambulatory Visit
Admission: RE | Admit: 2015-03-02 | Discharge: 2015-03-02 | Disposition: A | Discharge status: Home / Self Care | Payer: Medicare HMO | Source: Ambulatory Visit | Attending: Endocrinology | Admitting: Endocrinology

## 2015-03-02 DIAGNOSIS — I501 Left ventricular failure: Secondary | ICD-10-CM | POA: Insufficient documentation

## 2015-03-02 DIAGNOSIS — I051 Rheumatic mitral insufficiency: Secondary | ICD-10-CM

## 2015-03-02 NOTE — Progress Notes (Signed)
*  PRELIMINARY RESULTS* Echocardiogram 2D Echocardiogram has been performed.  Anita Small 03/02/2015, 12:38 PM

## 2015-06-26 DIAGNOSIS — I219 Acute myocardial infarction, unspecified: Secondary | ICD-10-CM

## 2015-06-26 HISTORY — DX: Acute myocardial infarction, unspecified: I21.9

## 2015-07-28 ENCOUNTER — Emergency Department: Payer: Medicare HMO

## 2015-07-28 ENCOUNTER — Inpatient Hospital Stay
Admission: EM | Admit: 2015-07-28 | Discharge: 2015-08-08 | DRG: 246 | Disposition: A | Discharge status: Skilled Nursing Facility | Payer: Medicare HMO | Attending: Internal Medicine | Admitting: Internal Medicine

## 2015-07-28 ENCOUNTER — Encounter: Payer: Self-pay | Admitting: Emergency Medicine

## 2015-07-28 DIAGNOSIS — T790XXA Air embolism (traumatic), initial encounter: Secondary | ICD-10-CM

## 2015-07-28 DIAGNOSIS — E785 Hyperlipidemia, unspecified: Secondary | ICD-10-CM | POA: Diagnosis present

## 2015-07-28 DIAGNOSIS — Z79899 Other long term (current) drug therapy: Secondary | ICD-10-CM

## 2015-07-28 DIAGNOSIS — S42295A Other nondisplaced fracture of upper end of left humerus, initial encounter for closed fracture: Secondary | ICD-10-CM | POA: Diagnosis present

## 2015-07-28 DIAGNOSIS — Y92002 Bathroom of unspecified non-institutional (private) residence single-family (private) house as the place of occurrence of the external cause: Secondary | ICD-10-CM

## 2015-07-28 DIAGNOSIS — R000 Tachycardia, unspecified: Secondary | ICD-10-CM | POA: Diagnosis present

## 2015-07-28 DIAGNOSIS — S72142A Displaced intertrochanteric fracture of left femur, initial encounter for closed fracture: Secondary | ICD-10-CM | POA: Diagnosis present

## 2015-07-28 DIAGNOSIS — Z881 Allergy status to other antibiotic agents status: Secondary | ICD-10-CM

## 2015-07-28 DIAGNOSIS — Z87891 Personal history of nicotine dependence: Secondary | ICD-10-CM

## 2015-07-28 DIAGNOSIS — I2510 Atherosclerotic heart disease of native coronary artery without angina pectoris: Secondary | ICD-10-CM | POA: Diagnosis present

## 2015-07-28 DIAGNOSIS — Z419 Encounter for procedure for purposes other than remedying health state, unspecified: Secondary | ICD-10-CM

## 2015-07-28 DIAGNOSIS — I214 Non-ST elevation (NSTEMI) myocardial infarction: Principal | ICD-10-CM | POA: Diagnosis present

## 2015-07-28 DIAGNOSIS — K219 Gastro-esophageal reflux disease without esophagitis: Secondary | ICD-10-CM | POA: Diagnosis present

## 2015-07-28 DIAGNOSIS — I10 Essential (primary) hypertension: Secondary | ICD-10-CM | POA: Diagnosis present

## 2015-07-28 DIAGNOSIS — Z794 Long term (current) use of insulin: Secondary | ICD-10-CM

## 2015-07-28 DIAGNOSIS — J189 Pneumonia, unspecified organism: Secondary | ICD-10-CM | POA: Diagnosis present

## 2015-07-28 DIAGNOSIS — Z6841 Body Mass Index (BMI) 40.0 and over, adult: Secondary | ICD-10-CM

## 2015-07-28 DIAGNOSIS — E039 Hypothyroidism, unspecified: Secondary | ICD-10-CM | POA: Diagnosis present

## 2015-07-28 DIAGNOSIS — S7292XA Unspecified fracture of left femur, initial encounter for closed fracture: Secondary | ICD-10-CM | POA: Diagnosis present

## 2015-07-28 DIAGNOSIS — R7989 Other specified abnormal findings of blood chemistry: Secondary | ICD-10-CM

## 2015-07-28 DIAGNOSIS — W19XXXA Unspecified fall, initial encounter: Secondary | ICD-10-CM | POA: Diagnosis present

## 2015-07-28 DIAGNOSIS — S42202A Unspecified fracture of upper end of left humerus, initial encounter for closed fracture: Secondary | ICD-10-CM

## 2015-07-28 DIAGNOSIS — E119 Type 2 diabetes mellitus without complications: Secondary | ICD-10-CM

## 2015-07-28 DIAGNOSIS — R4182 Altered mental status, unspecified: Secondary | ICD-10-CM | POA: Diagnosis not present

## 2015-07-28 DIAGNOSIS — Z7902 Long term (current) use of antithrombotics/antiplatelets: Secondary | ICD-10-CM

## 2015-07-28 DIAGNOSIS — R52 Pain, unspecified: Secondary | ICD-10-CM

## 2015-07-28 DIAGNOSIS — S42302A Unspecified fracture of shaft of humerus, left arm, initial encounter for closed fracture: Secondary | ICD-10-CM | POA: Diagnosis present

## 2015-07-28 DIAGNOSIS — I493 Ventricular premature depolarization: Secondary | ICD-10-CM | POA: Diagnosis present

## 2015-07-28 DIAGNOSIS — R778 Other specified abnormalities of plasma proteins: Secondary | ICD-10-CM

## 2015-07-28 DIAGNOSIS — Z833 Family history of diabetes mellitus: Secondary | ICD-10-CM

## 2015-07-28 DIAGNOSIS — Z8249 Family history of ischemic heart disease and other diseases of the circulatory system: Secondary | ICD-10-CM

## 2015-07-28 DIAGNOSIS — Z7982 Long term (current) use of aspirin: Secondary | ICD-10-CM

## 2015-07-28 DIAGNOSIS — R0902 Hypoxemia: Secondary | ICD-10-CM

## 2015-07-28 HISTORY — DX: Atherosclerosis of coronary artery bypass graft(s) without angina pectoris: I25.810

## 2015-07-28 LAB — CBC WITH DIFFERENTIAL/PLATELET
BASOS PCT: 1 %
Basophils Absolute: 0.1 10*3/uL (ref 0–0.1)
EOS ABS: 0 10*3/uL (ref 0–0.7)
Eosinophils Relative: 0 %
HCT: 39.1 % (ref 35.0–47.0)
HEMOGLOBIN: 12.6 g/dL (ref 12.0–16.0)
LYMPHS ABS: 1 10*3/uL (ref 1.0–3.6)
Lymphocytes Relative: 8 %
MCH: 27.6 pg (ref 26.0–34.0)
MCHC: 32.2 g/dL (ref 32.0–36.0)
MCV: 85.7 fL (ref 80.0–100.0)
Monocytes Absolute: 0.6 10*3/uL (ref 0.2–0.9)
Monocytes Relative: 4 %
NEUTROS PCT: 87 %
Neutro Abs: 11.7 10*3/uL — ABNORMAL HIGH (ref 1.4–6.5)
Platelets: 284 10*3/uL (ref 150–440)
RBC: 4.56 MIL/uL (ref 3.80–5.20)
RDW: 16 % — ABNORMAL HIGH (ref 11.5–14.5)
WBC: 13.4 10*3/uL — AB (ref 3.6–11.0)

## 2015-07-28 LAB — URINALYSIS COMPLETE WITH MICROSCOPIC (ARMC ONLY)
BILIRUBIN URINE: NEGATIVE
Hgb urine dipstick: NEGATIVE
Leukocytes, UA: NEGATIVE
Nitrite: NEGATIVE
PH: 5 (ref 5.0–8.0)
Protein, ur: NEGATIVE mg/dL
Specific Gravity, Urine: 1.031 — ABNORMAL HIGH (ref 1.005–1.030)

## 2015-07-28 LAB — COMPREHENSIVE METABOLIC PANEL
ALBUMIN: 4 g/dL (ref 3.5–5.0)
ALT: 34 U/L (ref 14–54)
AST: 26 U/L (ref 15–41)
Alkaline Phosphatase: 53 U/L (ref 38–126)
Anion gap: 13 (ref 5–15)
BILIRUBIN TOTAL: 0.7 mg/dL (ref 0.3–1.2)
BUN: 25 mg/dL — AB (ref 6–20)
CO2: 22 mmol/L (ref 22–32)
CREATININE: 1 mg/dL (ref 0.44–1.00)
Calcium: 8.8 mg/dL — ABNORMAL LOW (ref 8.9–10.3)
Chloride: 106 mmol/L (ref 101–111)
GFR calc Af Amer: >60 mL/min (ref >60)
GFR, EST NON AFRICAN AMERICAN: 57 mL/min — AB (ref >60)
GLUCOSE: 217 mg/dL — AB (ref 65–99)
POTASSIUM: 4 mmol/L (ref 3.5–5.1)
Sodium: 141 mmol/L (ref 135–145)
TOTAL PROTEIN: 7.3 g/dL (ref 6.5–8.1)

## 2015-07-28 LAB — LACTIC ACID, PLASMA: Lactic Acid, Venous: 1.9 mmol/L (ref 0.5–2.0)

## 2015-07-28 LAB — CK: CK TOTAL: 226 U/L (ref 38–234)

## 2015-07-28 LAB — TROPONIN I: TROPONIN I: 0.17 ng/mL — AB (ref <0.031)

## 2015-07-28 MED ORDER — HYDROMORPHONE HCL 1 MG/ML IJ SOLN
0.5000 mg | INTRAMUSCULAR | Status: AC
Start: 2015-07-28 — End: 2015-07-28
  Administered 2015-07-28: 0.5 mg via INTRAVENOUS
  Filled 2015-07-28: qty 1

## 2015-07-28 MED ORDER — MAGNESIUM HYDROXIDE 400 MG/5ML PO SUSP: 30.0000 mL | Freq: Every day | ORAL | Status: DC | PRN

## 2015-07-28 MED ORDER — HYDROMORPHONE HCL 1 MG/ML IJ SOLN: 0.5000 mg | Freq: Once | INTRAMUSCULAR | Status: DC

## 2015-07-28 MED ORDER — BISACODYL 5 MG PO TBEC
5.0000 mg | DELAYED_RELEASE_TABLET | Freq: Every day | ORAL | Status: DC | PRN
Start: 2015-07-28 — End: 2015-08-08
  Filled 2015-07-28: qty 1

## 2015-07-28 MED ORDER — HYDROCODONE-ACETAMINOPHEN 5-325 MG PO TABS
1.0000 | ORAL_TABLET | ORAL | Status: DC | PRN
  Administered 2015-07-29 (×2): 2 via ORAL
  Administered 2015-07-29: 1 via ORAL
  Administered 2015-07-30 – 2015-08-01 (×9): 2 via ORAL
  Administered 2015-08-01: 1 via ORAL
  Administered 2015-08-02 (×2): 2 via ORAL
  Filled 2015-07-28 (×8): qty 2
  Filled 2015-07-28 (×2): qty 1
  Filled 2015-07-28 (×5): qty 2

## 2015-07-28 MED ORDER — CEFAZOLIN SODIUM-DEXTROSE 2-3 GM-% IV SOLR
2.0000 g | INTRAVENOUS | Status: AC
  Filled 2015-07-28: qty 50

## 2015-07-28 MED ORDER — ONDANSETRON HCL 4 MG/2ML IJ SOLN
4.0000 mg | Freq: Once | INTRAMUSCULAR | Status: AC
  Administered 2015-07-28: 4 mg via INTRAVENOUS
  Filled 2015-07-28: qty 2

## 2015-07-28 MED ORDER — DOCUSATE SODIUM 100 MG PO CAPS
100.0000 mg | ORAL_CAPSULE | Freq: Two times a day (BID) | ORAL | Status: DC
  Administered 2015-07-29 – 2015-08-08 (×14): 100 mg via ORAL
  Filled 2015-07-28 (×18): qty 1

## 2015-07-28 MED ORDER — ZOLPIDEM TARTRATE 5 MG PO TABS: 5.0000 mg | ORAL_TABLET | Freq: Every evening | ORAL | Status: DC | PRN

## 2015-07-28 MED ORDER — MORPHINE SULFATE (PF) 2 MG/ML IV SOLN
2.0000 mg | INTRAVENOUS | Status: DC | PRN
  Administered 2015-07-29: 2 mg via INTRAVENOUS
  Filled 2015-07-28: qty 1

## 2015-07-28 MED ORDER — SODIUM CHLORIDE 0.9 % IV BOLUS (SEPSIS)
1000.0000 mL | Freq: Once | INTRAVENOUS | Status: AC
  Administered 2015-07-28: 1000 mL via INTRAVENOUS
  Filled 2015-07-28: qty 1000

## 2015-07-28 MED ORDER — MAGNESIUM CITRATE PO SOLN: 1.0000 | Freq: Once | ORAL | Status: DC | PRN

## 2015-07-28 MED ORDER — ONDANSETRON HCL 4 MG/2ML IJ SOLN: 4.0000 mg | Freq: Once | INTRAMUSCULAR | Status: DC

## 2015-07-28 MED ORDER — SODIUM CHLORIDE 0.9 % IV BOLUS (SEPSIS): 1000.0000 mL | Freq: Once | INTRAVENOUS | Status: DC

## 2015-07-28 NOTE — Consult Note (Addendum)
Left hip and shoulder fractures. Plan ORIF hip when medically stable, shoulder possible when swelling diminished.  Patient is living independently right-hand dominant. She unfortunately suffered a fall in the bathroom and lives alone was not found for many hours. She has been noted to have extensive swelling to the left shoulder and along with a fracture has left high intertrochanteric hip fracture. She had no prodromal symptoms. 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  On examination the left leg is shortened and actually rotated. She is neurovascular intact able flex extend the toes Dennis minimal edema in the lower extremities. Skin is intact.  Left upper extremity she has massive swelling and ecchymosis about the left shoulder she is neurovascular intact including surgeons patch area for sensation.  Radiographic studies. Slightly comminuted left high intertrochanteric hip fracture with lesser trochanteric. Left shoulder comminuted head and neck fracture with CT showing multiple fragments displaced.   Impression 1 left intertrochanteric hip fracture requiring ORIF  2 left proximal humerus fracture comminuted complex with extensive swelling, will probably need a reverse shoulder replacement and will discuss with Dr. Roland Rack. With her extensive swelling this will have to wait it more than a week.  Assessment the patient with the shoulder and hip fracture she can extend expect a extended time in a skilled facility for physical therapy and also the need for subsequent shoulder surgery

## 2015-07-28 NOTE — ED Notes (Signed)
Dr. Reita Cliche notified of Troponin result

## 2015-07-28 NOTE — ED Provider Notes (Signed)
[facility] Emergency Department Provider Note   ____________________________________________  Time seen: Approximately 8:15 PM I have reviewed the triage vital signs and the triage nursing note.  HISTORY  Chief Complaint Fall   Historian Patient  HPI Anita Small is a 65 y.o. female who fell in the bathroom around 1 AM and was unable to get up secondary to left hip pain. Throughout the day she was yelling "help me" and a neighbor heard from outside the window and was able to call for help. Patient states that she thinks she could have hit her head but she doesn't think that she has any head injury because she never had a headache and did not pass out or lose consciousness. She took most of the injury to her left shoulder and is complaining of left shoulder plane in addition to the right hip pain. No numbness or tingling. No trouble breathing or chest pain. No dizziness or vision changes. No trouble with speech. No confusion. She does have a dry mouth.    Past Medical History  Diagnosis Date  . Depression     Paranoid tendencies  . Diabetes mellitus     Type II  . GERD (gastroesophageal reflux disease)   . Hypertension   . Anxiety   . Arthritis     Left knee  . Adenomatous polyp 1999  . Hyperlipidemia   . Hypothyroidism   . Obesity   . Diverticulosis     Patient Active Problem List   Diagnosis Date Noted  . Routine general medical examination at a health care facility 09/19/2010  . Other and unspecified hyperlipidemia 09/19/2010  . CAROTID ARTERY STENOSIS 07/25/2009  . Type II or unspecified type diabetes mellitus without mention of complication, uncontrolled 11/01/2006  . ANXIETY 11/01/2006  . HYPOTHYROIDISM 10/31/2006  . OBESITY 10/31/2006  . Episodic mood disorder ([place]) 10/31/2006  . HYPERTENSION 10/31/2006  . GERD 10/31/2006  . Osteoarthritis, multiple sites 10/31/2006    Past Surgical History  Procedure Laterality Date  .  Heel spur surgery  03/03    Left  . Knee arthroscopy  2001    Left   Tamala Julian)  . Abdominal hysterectomy  1977    One ovary left  . Bladder repair  1977    Tacking  . Cervical spine surgery  1999  . Ct abd w & pelvis wo cm  2002    Negative. Pituitary normal on MRI  . Carotid endarterectomy  2/11    Dr Jamal Collin  . Cardiovascular stress test  7/00    negative. No cardiolite  . Transthoracic echocardiogram  3/04    normal  . Appendectomy    . Cholecystectomy    . Total knee arthroplasty  11/12    left--Dr Tamala Julian    Current Outpatient Rx  Name  Route  Sig  Dispense  Refill  . aspirin 81 MG chewable tablet   Oral   Chew 81 mg by mouth daily.         . canagliflozin (INVOKANA) 300 MG TABS tablet   Oral   Take 300 mg by mouth daily.         . cetirizine (ZYRTEC) 10 MG tablet   Oral   Take 10 mg by mouth at bedtime.         . citalopram (CELEXA) 40 MG tablet   Oral   Take 40 mg by mouth daily.         . clopidogrel (PLAVIX) 75 MG tablet  Oral   Take 75 mg by mouth daily.         . fenofibrate (TRICOR) 145 MG tablet   Oral   Take 145 mg by mouth daily.         . hydrochlorothiazide (HYDRODIURIL) 25 MG tablet   Oral   Take 1 tablet (25 mg total) by mouth daily. Take everyday with Lisinopril Patient taking differently: Take 25 mg by mouth daily.    90 tablet   1   . insulin lispro (HUMALOG) 100 UNIT/ML injection   Subcutaneous   Inject 50 Units into the skin 3 (three) times daily.         Marland Kitchen levothyroxine (SYNTHROID, LEVOTHROID) 100 MCG tablet   Oral   Take 1 tablet (100 mcg total) by mouth daily.   90 tablet   1   . lisinopril (PRINIVIL,ZESTRIL) 20 MG tablet   Oral   Take 1 tablet (20 mg total) by mouth daily. Take everyday with HCTZ Patient taking differently: Take 20 mg by mouth daily.    90 tablet   1   . nystatin (MYCOSTATIN/NYSTOP) 100000 UNIT/GM POWD   Topical   Apply topically 2 (two) times daily.         . simvastatin (ZOCOR) 40  MG tablet   Oral   Take 40 mg by mouth at bedtime.         . sitaGLIPtin-metformin (JANUMET) 50-1000 MG per tablet   Oral   Take 1 tablet by mouth 2 (two) times daily with a meal.   180 tablet   1   . traZODone (DESYREL) 100 MG tablet   Oral   Take 1-2 tablets (100-200 mg total) by mouth at bedtime as needed. Patient taking differently: Take 100 mg by mouth at bedtime.    180 tablet   3   . triamcinolone cream (KENALOG) 0.1 %   Topical   Apply 1 application topically 2 (two) times daily as needed (for irritation).           Allergies Codeine phosphate; Duloxetine; Meperidine hcl; Venlafaxine; and Doxycycline  Family History  Problem Relation Age of Onset  . Cancer Mother     Breast  . Heart disease Mother   . Diabetes Mother   . Heart disease Father   . Diabetes Father   . Hypertension Sister   . Cancer Paternal Grandmother     ? throat CA  . Depression Daughter   . Colon cancer      Unsure if mother had it   . Colon polyps Mother     Social History Social History  Substance Use Topics  . Smoking status: Former Smoker    Types: Cigarettes  . Smokeless tobacco: Never Used  . Alcohol Use: No    Review of Systems  Constitutional: Negative for fever. Eyes: Negative for visual changes. ENT: Negative for sore throat. Cardiovascular: Negative for chest pain. Respiratory: Negative for shortness of breath. Gastrointestinal: Negative for abdominal pain, vomiting and diarrhea. Genitourinary: Negative for dysuria. Musculoskeletal: Positive for low back pain. Skin: Negative for rash. Neurological: Negative for headache. 10 point Review of Systems otherwise negative ____________________________________________   PHYSICAL EXAM:  VITAL SIGNS: ED Triage Vitals  Enc Vitals Group     BP 07/28/15 1929 147/127 mmHg     Pulse Rate 07/28/15 1929 101     Resp 07/28/15 1929 18     Temp 07/28/15 1929 99.3 F (37.4 C)     Temp Source 07/28/15 1929 Oral  SpO2  07/28/15 1929 97 %     Weight --      Height --      Head Cir --      Peak Flow --      Pain Score 07/28/15 1930 10     Pain Loc --      Pain Edu? --      Excl. in [place]? --      Constitutional: Alert and oriented. Well appearing and in no distress. Eyes: Conjunctivae are normal. PERRL. Normal extraocular movements. ENT   Head: Normocephalic and atraumatic.   Nose: No congestion/rhinnorhea.   Mouth/Throat: Mucous membranes are moderately dry.   Neck: No stridor. No midline C-spine tenderness to palpation or range of motion. Cardiovascular/Chest: Normal rate, regular rhythm.  No murmurs, rubs, or gallops. Respiratory: Normal respiratory effort without tachypnea nor retractions. Breath sounds are clear and equal bilaterally. No wheezes/rales/rhonchi. Gastrointestinal: Soft. No distention, no guarding, no rebound. Nontender. Obese.  Genitourinary/rectal:Deferred Musculoskeletal: Pelvis stable, but hip pain at the left with palpation and any movement. Neurovascularly intact distally 4 extremities. Left shoulder huge ecchymosis and tenderness to palpation and range of motion. Neurologic:  Normal speech and language. No gross or focal neurologic deficits are appreciated. Skin:  Skin is warm, dry and intact. No rash noted. Psychiatric: Mood and affect are normal. Speech and behavior are normal. Patient exhibits appropriate insight and judgment.  ____________________________________________   EKG I, Lisa Roca, MD, the attending physician have personally viewed and interpreted all ECGs.  102 bpm. Sinus tachycardia. Nonspec intraventricular conduction delay.  Normal axis. Mild ST depression anterior and laterally with T-wave biphasic/ inverted laterally ____________________________________________  LABS (pertinent positives/negatives)  Urinalysis rare bacteria, trace ketones Comprehensive metabolic panel significant for BUN 25 cranny 1.00 Troponin 0.17 and lactate 1.9 White  blood cell count 13.4 with left shift. Hemoglobin 12.6 and platelet count 284 CK 226 ____________________________________________  RADIOLOGY All Xrays were viewed by me. Imaging interpreted by Radiologist.  Left shoulder:  IMPRESSION: Acute nondisplaced LEFT humeral head and neck fracture. Widening subacromial joint space could represent effusion or ligamentous injury without frank dislocation.  Left hip with pelvis: Left intertrochanteric fracture, viewed and interpreted by me  Chest x-ray:  IMPRESSION: 1. No acute pulmonary process. 2. Left proximal humerus fracture, partially included. __________________________________________  PROCEDURES  Procedure(s) performed: None  Critical Care performed: None  ____________________________________________   ED COURSE / ASSESSMENT AND PLAN  Pertinent labs & imaging results that were available during my care of the patient were reviewed by me and considered in my medical decision making (see chart for details).   Patient has evidence of trauma to the left shoulder and left hip on exam. She thinks she could've hit her head a little bit, but she has neurologic exam intact and no headache. It's been now greater than 12 hours without any symptoms, and so I don't think she needs a head CT at this point time.  No neck pain. Cervical spine is cleared clinically.  X-rays confirm suspicion of left humeral head fracture, and left intratrochanteric fracture. She is neurovascularly intact. Discussed with orthopedic surgeon. Discussed with hospitalist for medical issues including minimally elevated troponin. She is not having chest pain nor has she had chest pain. I suspect this is likely supply demand mismatch. Her EKG is nonspecific.  I will discuss her an evaluation with the hospitalist. She does take baby aspirin and Plavix at baseline. I don't think she'll need any additional blood thinning at this point in  time.  Dr. Rudene Christians. He recommends CT  noncontrast of the left shoulder to help evaluate whether or not she might need surgery in the future.  Hospitalist to admit.    CONSULTATIONS:   Orthopedics and hospitalist for admission.   Patient / Family / Caregiver informed of clinical course, medical decision-making process, and agree with plan.     ___________________________________________   FINAL CLINICAL IMPRESSION(S) / ED DIAGNOSES   Final diagnoses:  Fracture, humerus, proximal, left, closed, initial encounter  Intertrochanteric fracture of left hip, closed, initial encounter ([place])  Troponin I above reference range              Note: This dictation was prepared with Dragon dictation. Any transcriptional errors that result from this process are unintentional   Lisa Roca, MD 07/28/15 2214

## 2015-07-28 NOTE — ED Notes (Signed)
Per EMS patient had an unwitnessed fall and has been lying on the floor since 0100 on 07/28/2015.  She has been given 175mcg of Fentanyl en route to [facility].  She fell on her left side, injuring her shoulder and left hip.

## 2015-07-28 NOTE — ED Notes (Signed)
Admitting MD at beside. 

## 2015-07-28 NOTE — ED Notes (Signed)
Introduced pt and assessed pain.  Explained t pt I was unable to give her sandwich tray until admitting MD hd seen pt.  Encouraged pt to call w/ any needs.

## 2015-07-29 ENCOUNTER — Encounter
Admission: EM | Disposition: A | Discharge status: Skilled Nursing Facility | Payer: Self-pay | Source: Home / Self Care | Attending: Internal Medicine

## 2015-07-29 ENCOUNTER — Inpatient Hospital Stay (HOSPITAL_COMMUNITY)
Admit: 2015-07-29 | Discharge: 2015-07-29 | Disposition: A | Discharge status: Home / Self Care | Payer: Medicare HMO | Attending: Physician Assistant | Admitting: Physician Assistant

## 2015-07-29 ENCOUNTER — Encounter: Payer: Self-pay | Admitting: Anesthesiology

## 2015-07-29 DIAGNOSIS — E119 Type 2 diabetes mellitus without complications: Secondary | ICD-10-CM | POA: Diagnosis present

## 2015-07-29 DIAGNOSIS — W19XXXA Unspecified fall, initial encounter: Secondary | ICD-10-CM | POA: Diagnosis present

## 2015-07-29 DIAGNOSIS — S72142A Displaced intertrochanteric fracture of left femur, initial encounter for closed fracture: Secondary | ICD-10-CM | POA: Diagnosis present

## 2015-07-29 DIAGNOSIS — J189 Pneumonia, unspecified organism: Secondary | ICD-10-CM | POA: Diagnosis present

## 2015-07-29 DIAGNOSIS — Z833 Family history of diabetes mellitus: Secondary | ICD-10-CM | POA: Diagnosis not present

## 2015-07-29 DIAGNOSIS — Z7982 Long term (current) use of aspirin: Secondary | ICD-10-CM | POA: Diagnosis not present

## 2015-07-29 DIAGNOSIS — I214 Non-ST elevation (NSTEMI) myocardial infarction: Secondary | ICD-10-CM | POA: Diagnosis present

## 2015-07-29 DIAGNOSIS — R7989 Other specified abnormal findings of blood chemistry: Secondary | ICD-10-CM | POA: Diagnosis not present

## 2015-07-29 DIAGNOSIS — Z8249 Family history of ischemic heart disease and other diseases of the circulatory system: Secondary | ICD-10-CM | POA: Diagnosis not present

## 2015-07-29 DIAGNOSIS — I10 Essential (primary) hypertension: Secondary | ICD-10-CM | POA: Diagnosis present

## 2015-07-29 DIAGNOSIS — R000 Tachycardia, unspecified: Secondary | ICD-10-CM | POA: Diagnosis not present

## 2015-07-29 DIAGNOSIS — R4182 Altered mental status, unspecified: Secondary | ICD-10-CM | POA: Diagnosis not present

## 2015-07-29 DIAGNOSIS — S42295A Other nondisplaced fracture of upper end of left humerus, initial encounter for closed fracture: Secondary | ICD-10-CM | POA: Diagnosis present

## 2015-07-29 DIAGNOSIS — E039 Hypothyroidism, unspecified: Secondary | ICD-10-CM | POA: Diagnosis present

## 2015-07-29 DIAGNOSIS — Z87891 Personal history of nicotine dependence: Secondary | ICD-10-CM | POA: Diagnosis not present

## 2015-07-29 DIAGNOSIS — Z79899 Other long term (current) drug therapy: Secondary | ICD-10-CM | POA: Diagnosis not present

## 2015-07-29 DIAGNOSIS — Z6841 Body Mass Index (BMI) 40.0 and over, adult: Secondary | ICD-10-CM | POA: Diagnosis not present

## 2015-07-29 DIAGNOSIS — I2510 Atherosclerotic heart disease of native coronary artery without angina pectoris: Secondary | ICD-10-CM | POA: Diagnosis not present

## 2015-07-29 DIAGNOSIS — K219 Gastro-esophageal reflux disease without esophagitis: Secondary | ICD-10-CM | POA: Diagnosis present

## 2015-07-29 DIAGNOSIS — Z881 Allergy status to other antibiotic agents status: Secondary | ICD-10-CM | POA: Diagnosis not present

## 2015-07-29 DIAGNOSIS — Z01810 Encounter for preprocedural cardiovascular examination: Secondary | ICD-10-CM

## 2015-07-29 DIAGNOSIS — I493 Ventricular premature depolarization: Secondary | ICD-10-CM | POA: Diagnosis present

## 2015-07-29 DIAGNOSIS — E785 Hyperlipidemia, unspecified: Secondary | ICD-10-CM | POA: Diagnosis present

## 2015-07-29 DIAGNOSIS — Z7902 Long term (current) use of antithrombotics/antiplatelets: Secondary | ICD-10-CM | POA: Diagnosis not present

## 2015-07-29 DIAGNOSIS — Y92002 Bathroom of unspecified non-institutional (private) residence single-family (private) house as the place of occurrence of the external cause: Secondary | ICD-10-CM | POA: Diagnosis not present

## 2015-07-29 DIAGNOSIS — Z794 Long term (current) use of insulin: Secondary | ICD-10-CM | POA: Diagnosis not present

## 2015-07-29 LAB — TROPONIN I
TROPONIN I: 0.72 ng/mL — AB (ref <0.031)
TROPONIN I: 1.12 ng/mL — AB (ref <0.031)
Troponin I: 0.33 ng/mL — ABNORMAL HIGH (ref <0.031)
Troponin I: 0.36 ng/mL — ABNORMAL HIGH (ref <0.031)

## 2015-07-29 LAB — GLUCOSE, CAPILLARY
GLUCOSE-CAPILLARY: 178 mg/dL — AB (ref 65–99)
GLUCOSE-CAPILLARY: 162 mg/dL — AB (ref 65–99)
Glucose-Capillary: 223 mg/dL — ABNORMAL HIGH (ref 65–99)
Glucose-Capillary: 194 mg/dL — ABNORMAL HIGH (ref 65–99)
Glucose-Capillary: 258 mg/dL — ABNORMAL HIGH (ref 65–99)

## 2015-07-29 LAB — BASIC METABOLIC PANEL
ANION GAP: 13 (ref 5–15)
BUN: 22 mg/dL — ABNORMAL HIGH (ref 6–20)
CALCIUM: 8.5 mg/dL — AB (ref 8.9–10.3)
CO2: 22 mmol/L (ref 22–32)
CREATININE: 1.08 mg/dL — AB (ref 0.44–1.00)
Chloride: 106 mmol/L (ref 101–111)
GFR calc Af Amer: >60 mL/min (ref >60)
GFR, EST NON AFRICAN AMERICAN: 52 mL/min — AB (ref >60)
Glucose, Bld: 201 mg/dL — ABNORMAL HIGH (ref 65–99)
Potassium: 4 mmol/L (ref 3.5–5.1)
SODIUM: 141 mmol/L (ref 135–145)

## 2015-07-29 LAB — CBC
HCT: 34.8 % — ABNORMAL LOW (ref 35.0–47.0)
HEMOGLOBIN: 11.3 g/dL — AB (ref 12.0–16.0)
MCH: 28 pg (ref 26.0–34.0)
MCHC: 32.4 g/dL (ref 32.0–36.0)
MCV: 86.6 fL (ref 80.0–100.0)
Platelets: 271 10*3/uL (ref 150–440)
RBC: 4.03 MIL/uL (ref 3.80–5.20)
RDW: 16 % — ABNORMAL HIGH (ref 11.5–14.5)
WBC: 10.7 10*3/uL (ref 3.6–11.0)

## 2015-07-29 LAB — CK
CK TOTAL: 369 U/L — AB (ref 38–234)
CK TOTAL: 462 U/L — AB (ref 38–234)

## 2015-07-29 LAB — PROTIME-INR
INR: 1.05
PROTHROMBIN TIME: 13.9 s (ref 11.4–15.0)

## 2015-07-29 LAB — LACTIC ACID, PLASMA: LACTIC ACID, VENOUS: 1.4 mmol/L (ref 0.5–2.0)

## 2015-07-29 SURGERY — FIXATION, FRACTURE, INTERTROCHANTERIC, WITH INTRAMEDULLARY ROD
Anesthesia: Choice | Laterality: Left

## 2015-07-29 MED ORDER — SODIUM CHLORIDE 0.9 % IV SOLN
INTRAVENOUS | Status: DC
  Administered 2015-07-29 – 2015-07-31 (×3): via INTRAVENOUS

## 2015-07-29 MED ORDER — HYDROCODONE-ACETAMINOPHEN 5-325 MG PO TABS: 1.0000 | ORAL_TABLET | ORAL | Status: DC | PRN

## 2015-07-29 MED ORDER — MORPHINE SULFATE (PF) 2 MG/ML IV SOLN
2.0000 mg | INTRAVENOUS | Status: DC | PRN
  Administered 2015-07-29 – 2015-08-04 (×7): 2 mg via INTRAVENOUS
  Filled 2015-07-29 (×8): qty 1

## 2015-07-29 MED ORDER — ONDANSETRON HCL 4 MG/2ML IJ SOLN: 4.0000 mg | Freq: Four times a day (QID) | INTRAMUSCULAR | Status: DC | PRN

## 2015-07-29 MED ORDER — ENOXAPARIN SODIUM 120 MG/0.8ML ~~LOC~~ SOLN
1.0000 mg/kg | Freq: Two times a day (BID) | SUBCUTANEOUS | Status: DC
  Administered 2015-07-29 – 2015-08-01 (×6): 120 mg via SUBCUTANEOUS
  Filled 2015-07-29 (×11): qty 0.8

## 2015-07-29 MED ORDER — CITALOPRAM HYDROBROMIDE 20 MG PO TABS
40.0000 mg | ORAL_TABLET | Freq: Every day | ORAL | Status: DC
  Administered 2015-07-30 – 2015-08-08 (×8): 40 mg via ORAL
  Filled 2015-07-29 (×8): qty 2

## 2015-07-29 MED ORDER — SIMVASTATIN 40 MG PO TABS
40.0000 mg | ORAL_TABLET | Freq: Every day | ORAL | Status: DC
  Administered 2015-07-29 – 2015-08-02 (×4): 40 mg via ORAL
  Filled 2015-07-29 (×4): qty 1

## 2015-07-29 MED ORDER — LINAGLIPTIN 5 MG PO TABS
5.0000 mg | ORAL_TABLET | Freq: Every day | ORAL | Status: DC
  Administered 2015-07-30 – 2015-08-08 (×8): 5 mg via ORAL
  Filled 2015-07-29 (×8): qty 1

## 2015-07-29 MED ORDER — BISACODYL 5 MG PO TBEC: 5.0000 mg | DELAYED_RELEASE_TABLET | Freq: Every day | ORAL | Status: DC | PRN

## 2015-07-29 MED ORDER — INSULIN ASPART 100 UNIT/ML ~~LOC~~ SOLN
0.0000 [IU] | Freq: Three times a day (TID) | SUBCUTANEOUS | Status: DC
  Administered 2015-07-29: 11 [IU] via SUBCUTANEOUS
  Administered 2015-07-29: 7 [IU] via SUBCUTANEOUS
  Administered 2015-07-29: 4 [IU] via SUBCUTANEOUS
  Administered 2015-07-30: 11 [IU] via SUBCUTANEOUS
  Administered 2015-07-30 – 2015-07-31 (×3): 4 [IU] via SUBCUTANEOUS
  Administered 2015-07-31 (×2): 7 [IU] via SUBCUTANEOUS
  Administered 2015-08-01 – 2015-08-02 (×3): 4 [IU] via SUBCUTANEOUS
  Administered 2015-08-02: 7 [IU] via SUBCUTANEOUS
  Administered 2015-08-02: 3 [IU] via SUBCUTANEOUS
  Administered 2015-08-03 (×2): 4 [IU] via SUBCUTANEOUS
  Administered 2015-08-03: 7 [IU] via SUBCUTANEOUS
  Administered 2015-08-04 (×2): 4 [IU] via SUBCUTANEOUS
  Administered 2015-08-05 – 2015-08-06 (×4): 7 [IU] via SUBCUTANEOUS
  Administered 2015-08-06: 11 [IU] via SUBCUTANEOUS
  Administered 2015-08-06: 7 [IU] via SUBCUTANEOUS
  Administered 2015-08-07: 4 [IU] via SUBCUTANEOUS
  Administered 2015-08-07: 7 [IU] via SUBCUTANEOUS
  Administered 2015-08-07: 15 [IU] via SUBCUTANEOUS
  Administered 2015-08-08: 7 [IU] via SUBCUTANEOUS
  Administered 2015-08-08: 11 [IU] via SUBCUTANEOUS
  Filled 2015-07-29 (×5): qty 7
  Filled 2015-07-29: qty 3
  Filled 2015-07-29: qty 11
  Filled 2015-07-29: qty 15
  Filled 2015-07-29: qty 11
  Filled 2015-07-29 (×2): qty 4
  Filled 2015-07-29 (×2): qty 7
  Filled 2015-07-29: qty 4
  Filled 2015-07-29: qty 11
  Filled 2015-07-29 (×3): qty 4
  Filled 2015-07-29: qty 7
  Filled 2015-07-29: qty 2
  Filled 2015-07-29: qty 4
  Filled 2015-07-29: qty 7
  Filled 2015-07-29 (×2): qty 4
  Filled 2015-07-29 (×3): qty 7
  Filled 2015-07-29 (×2): qty 4
  Filled 2015-07-29: qty 7
  Filled 2015-07-29: qty 4
  Filled 2015-07-29: qty 3
  Filled 2015-07-29: qty 4

## 2015-07-29 MED ORDER — SODIUM CHLORIDE 0.9% FLUSH
3.0000 mL | Freq: Two times a day (BID) | INTRAVENOUS | Status: DC
  Administered 2015-07-29 – 2015-08-08 (×8): 3 mL via INTRAVENOUS

## 2015-07-29 MED ORDER — METFORMIN HCL 500 MG PO TABS: 1000.0000 mg | ORAL_TABLET | Freq: Two times a day (BID) | ORAL | Status: DC

## 2015-07-29 MED ORDER — SITAGLIPTIN PHOS-METFORMIN HCL 50-1000 MG PO TABS: 1.0000 | ORAL_TABLET | Freq: Two times a day (BID) | ORAL | Status: DC

## 2015-07-29 MED ORDER — FENOFIBRATE 160 MG PO TABS
160.0000 mg | ORAL_TABLET | Freq: Every day | ORAL | Status: DC
  Administered 2015-07-30 – 2015-08-08 (×8): 160 mg via ORAL
  Filled 2015-07-29 (×8): qty 1

## 2015-07-29 MED ORDER — INSULIN ASPART 100 UNIT/ML ~~LOC~~ SOLN
0.0000 [IU] | Freq: Every day | SUBCUTANEOUS | Status: DC
  Administered 2015-08-03: 2 [IU] via SUBCUTANEOUS
  Administered 2015-08-04: 3 [IU] via SUBCUTANEOUS
  Administered 2015-08-06: 2 [IU] via SUBCUTANEOUS
  Filled 2015-07-29 (×2): qty 2

## 2015-07-29 MED ORDER — LEVOTHYROXINE SODIUM 100 MCG PO TABS
100.0000 ug | ORAL_TABLET | Freq: Every day | ORAL | Status: DC
  Administered 2015-07-30 – 2015-08-08 (×8): 100 ug via ORAL
  Filled 2015-07-29 (×8): qty 1

## 2015-07-29 MED ORDER — ACETAMINOPHEN 325 MG PO TABS: 650.0000 mg | ORAL_TABLET | Freq: Four times a day (QID) | ORAL | Status: DC | PRN

## 2015-07-29 MED ORDER — ASPIRIN 81 MG PO CHEW
81.0000 mg | CHEWABLE_TABLET | Freq: Every day | ORAL | Status: DC
  Administered 2015-07-30 – 2015-08-08 (×8): 81 mg via ORAL
  Filled 2015-07-29 (×9): qty 1

## 2015-07-29 MED ORDER — ACETAMINOPHEN 650 MG RE SUPP: 650.0000 mg | Freq: Four times a day (QID) | RECTAL | Status: DC | PRN

## 2015-07-29 MED ORDER — ONDANSETRON HCL 4 MG PO TABS: 4.0000 mg | ORAL_TABLET | Freq: Four times a day (QID) | ORAL | Status: DC | PRN

## 2015-07-29 SURGICAL SUPPLY — 29 items
CANISTER SUCT 1200ML W/VALVE (MISCELLANEOUS) ×3 IMPLANT
CHLORAPREP W/TINT 26ML (MISCELLANEOUS) ×3 IMPLANT
DRAPE SHEET LG 3/4 BI-LAMINATE (DRAPES) ×3 IMPLANT
DRAPE SURG 17X11 SM STRL (DRAPES) ×3 IMPLANT
DRAPE U-SHAPE 47X51 STRL (DRAPES) ×3 IMPLANT
DRSG OPSITE POSTOP 4X6 (GAUZE/BANDAGES/DRESSINGS) ×9 IMPLANT
ELECT CAUTERY BLADE 6.4 (BLADE) ×3 IMPLANT
ELECT REM PT RETURN 9FT ADLT (ELECTROSURGICAL) ×3
ELECTRODE REM PT RTRN 9FT ADLT (ELECTROSURGICAL) ×1 IMPLANT
GAUZE SPONGE 4X4 12PLY STRL (GAUZE/BANDAGES/DRESSINGS) ×3 IMPLANT
GLOVE BIOGEL PI IND STRL 9 (GLOVE) ×1 IMPLANT
GLOVE BIOGEL PI INDICATOR 9 (GLOVE) ×2
GLOVE SURG ORTHO 9.0 STRL STRW (GLOVE) ×3 IMPLANT
GOWN SPECIALTY ULTRA XL (MISCELLANEOUS) ×3 IMPLANT
GOWN STRL REUS W/ TWL LRG LVL3 (GOWN DISPOSABLE) ×1 IMPLANT
GOWN STRL REUS W/TWL LRG LVL3 (GOWN DISPOSABLE) ×3
IV NS 500ML (IV SOLUTION) ×3
IV NS 500ML BAXH (IV SOLUTION) ×1 IMPLANT
KIT RM TURNOVER STRD PROC AR (KITS) ×3 IMPLANT
MAT BLUE FLOOR 46X72 FLO (MISCELLANEOUS) ×3 IMPLANT
NDL FILTER BLUNT 18X1 1/2 (NEEDLE) ×1 IMPLANT
NEEDLE FILTER BLUNT 18X 1/2SAF (NEEDLE) ×2
NEEDLE FILTER BLUNT 18X1 1/2 (NEEDLE) ×1 IMPLANT
PACK HIP COMPR (MISCELLANEOUS) ×1 IMPLANT
STAPLER SKIN PROX 35W (STAPLE) ×3 IMPLANT
SUT VIC AB 1 CT1 36 (SUTURE) ×1 IMPLANT
SUT VIC AB 2-0 CT1 (SUTURE) ×1 IMPLANT
SYRINGE 10CC LL (SYRINGE) ×3 IMPLANT
TAPE MICROFOAM 4IN (TAPE) ×3 IMPLANT

## 2015-07-29 NOTE — ED Notes (Signed)
Pt given bag of ice for hip and shoulder

## 2015-07-29 NOTE — Progress Notes (Signed)
Initial Nutrition Assessment   INTERVENTION:   Meals and Snacks: Cater to patient preferences on Heart healthy/Carb Modified Diet Order   NUTRITION DIAGNOSIS:   No nutrition diagnosis at this time  GOAL:   Patient will meet greater than or equal to 90% of their needs  MONITOR:    (Enrgy Intake, Electrolyte and renal Profile, Anthropometrics, glucose Profile)  REASON FOR ASSESSMENT:   Consult Hip fracture protocol  ASSESSMENT:   Pt admitted s/p fall with left femur fracture, surgical intervention cancelled for today secondary to cardiology recommendations.  Past Medical History  Diagnosis Date  . Depression     Paranoid tendencies  . Diabetes mellitus     Type II  . GERD (gastroesophageal reflux disease)   . Hypertension   . Anxiety   . Arthritis     Left knee  . Adenomatous polyp 1999  . Hyperlipidemia   . Hypothyroidism   . Obesity   . Diverticulosis      Diet Order:  Diet heart healthy/carb modified Room service appropriate?: Yes; Fluid consistency:: Thin    Current Nutrition: Pt NPO for possible surgery. Pt diet order just advanced and pt excited about getting a meal.  Food/Nutrition-Related History: Pt reports last meal on Wednesday. Pt reports very good appetite PTA.    Scheduled Medications:  . aspirin  81 mg Oral Daily  . citalopram  40 mg Oral Daily  . docusate sodium  100 mg Oral BID  . enoxaparin (LOVENOX) injection  1 mg/kg Subcutaneous Q12H  . fenofibrate  160 mg Oral Daily  . insulin aspart  0-20 Units Subcutaneous TID WC  . insulin aspart  0-5 Units Subcutaneous QHS  . levothyroxine  100 mcg Oral QAC breakfast  . linagliptin  5 mg Oral Daily  . simvastatin  40 mg Oral QHS  . sodium chloride flush  3 mL Intravenous Q12H    Continuous Medications:  . sodium chloride       Electrolyte/Renal Profile and Glucose Profile:   Recent Labs Lab 07/28/15 2104 07/29/15 0558  NA 141 141  K 4.0 4.0  CL 106 106  CO2 22 22  BUN 25* 22*   CREATININE 1.00 1.08*  CALCIUM 8.8* 8.5*  GLUCOSE 217* 201*   Protein Profile:   Recent Labs Lab 07/28/15 2104  ALBUMIN 4.0    Gastrointestinal Profile: Last BM:  07/27/2015   Nutrition-Focused Physical Exam Findings: Nutrition-Focused physical exam completed. Findings are WDL for fat depletion, muscle depletion, and edema.    Weight Change: Pt weight gain per [facility] weight encounters    Height:   Ht Readings from Last 1 Encounters:  02/23/14 5\' 2"  (1.575 m)    Weight:   Wt Readings from Last 1 Encounters:  07/29/15 262 lb 3.2 oz (118.933 kg)    Ideal Body Weight:   50kg  BMI:  Body mass index is 47.94 kg/(m^2).  Estimated Nutritional Needs:   Kcal:  using IBW of 50kg, BEE: 994kcals, TEE: (IF 1.1-1.3)(AF 1.3) 1420-1678kcals  Protein:  55-65g protein (1.1-1.3g/kg)  Fluid:  1250-1514mL of fluid (25-42mL/kg)  EDUCATION NEEDS:   No education needs identified at this time   LOW Care Level   Dwyane Luo, RD, LDN Pager [phone] Weekend/On-Call Pager [phone]

## 2015-07-29 NOTE — Progress Notes (Signed)
[facility] NAME: Anita Small    MR#:  [id number]  DATE OF BIRTH:  07/22/1950  SUBJECTIVE:  Patient admitted 07/28/15 after sustaining a mechanical fall. She denied any chest pain preceding palpitations shortness of breath. She was found to have fracture of left femur as well as left proximal humerus fracture. Noted to have elevated troponin started on therapeutic Lovenox cardiology was consult with This morning she complains only of pain in her shoulder and hip as expected describes pain 7/10 worse with movement no relieving factors denies any chest pain or further symptomatology / /REVIEW OF SYSTEMS:  CONSTITUTIONAL: No fever, fatigue or weakness.  EYES: No blurred or double vision.  EARS, NOSE, AND THROAT: No tinnitus or ear pain.  RESPIRATORY: No cough, shortness of breath, wheezing or hemoptysis.  CARDIOVASCULAR: No chest pain, orthopnea, edema.  GASTROINTESTINAL: No nausea, vomiting, diarrhea or abdominal pain.  GENITOURINARY: No dysuria, hematuria.  ENDOCRINE: No polyuria, nocturia,  HEMATOLOGY: No anemia, easy bruising or bleeding SKIN: No rash or lesion. MUSCULOSKELETAL: Pain in the left hip as well as left shoulder NEUROLOGIC: No tingling, numbness, weakness.  PSYCHIATRY: No anxiety or depression.   DRUG ALLERGIES:   Allergies  Allergen Reactions  . Codeine Phosphate     REACTION: unspecified  . Duloxetine     REACTION: unspecified  . Meperidine Hcl     REACTION: unspecified  . Venlafaxine     REACTION: unspecified  . Doxycycline Rash    VITALS:  Blood pressure 91/67, pulse 109, temperature 98 F (36.7 C), temperature source Oral, resp. rate 18, weight 118.933 kg (262 lb 3.2 oz), SpO2 90 %.  PHYSICAL EXAMINATION:  VITAL SIGNS: Filed Vitals:   07/29/15 1000 07/29/15 1109  BP: 124/61 91/67  Pulse: 105 109  Temp:  98 F (36.7 C)  Resp:  70   GENERAL:64 y.o.female currently in no acute distress.   HEAD: Normocephalic, atraumatic.  EYES: Pupils equal, round, reactive to light. Extraocular muscles intact. No scleral icterus.  MOUTH: Moist mucosal membrane. Dentition intact. No abscess noted.  EAR, NOSE, THROAT: Clear without exudates. No external lesions.  NECK: Supple. No thyromegaly. No nodules. No JVD.  PULMONARY: Clear to ascultation, without wheeze rails or rhonci. No use of accessory muscles, Good respiratory effort. good air entry bilaterally CHEST: Nontender to palpation.  CARDIOVASCULAR: S1 and S2. Regular rate and rhythm. No murmurs, rubs, or gallops. No edema. Pedal pulses 2+ bilaterally.  GASTROINTESTINAL: Soft, nontender, nondistended. No masses. Positive bowel sounds. No hepatosplenomegaly.  MUSCULOSKELETAL: No swelling, clubbing, or edema. Range of motion limited left sided extremities given fracture  NEUROLOGIC: Cranial nerves II through XII are intact. No gross focal neurological deficits. Sensation intact. Reflexes intact.  SKIN: Large area of ecchymosis left shoulder left hip No ulceration, lesions, rashes, or cyanosis. Skin warm and dry. Turgor intact.  PSYCHIATRIC: Mood, affect within normal limits. The patient is awake, alert and oriented x 3. Insight, judgment intact.      LABORATORY PANEL:   CBC  Recent Labs Lab 07/29/15 0558  WBC 10.7  HGB 11.3*  HCT 34.8*  PLT 271   ------------------------------------------------------------------------------------------------------------------  Chemistries   Recent Labs Lab 07/28/15 2104  NA 141  K 4.0  CL 106  CO2 22  GLUCOSE 217*  BUN 25*  CREATININE 1.00  CALCIUM 8.8*  AST 26  ALT 34  ALKPHOS 53  BILITOT 0.7   ------------------------------------------------------------------------------------------------------------------  Cardiac Enzymes  Recent Labs Lab 07/29/15 0758  TROPONINI 0.36*    ------------------------------------------------------------------------------------------------------------------  RADIOLOGY:  Dg Chest 1 View  07/28/2015  CLINICAL DATA:  Unwitnessed fall with left shoulder and hip injury. EXAM: CHEST 1 VIEW COMPARISON:  03/16/2015 FINDINGS: The cardiomediastinal contours are unchanged with atherosclerosis of the aortic arch. The lungs are clear. Pulmonary vasculature is normal. No consolidation, pleural effusion, or pneumothorax. Left proximal humerus fracture, partially included. IMPRESSION: 1.  No acute pulmonary process. 2. Left proximal humerus fracture, partially included. Electronically Signed   By: Jeb Levering M.D.   On: 07/28/2015 22:02   Ct Shoulder Left Wo Contrast  07/28/2015  CLINICAL DATA:  Left humeral head and neck fracture. Fall with subsequent left shoulder pain. EXAM: CT OF THE LEFT SHOULDER WITHOUT CONTRAST TECHNIQUE: Multidetector CT imaging was performed according to the standard protocol. Multiplanar CT image reconstructions were also generated. COMPARISON:  Radiographs earlier this day. FINDINGS: Highly comminuted and displaced fracture of the humeral head and neck. There is a curvilinear fragment displaced posterior medially measuring approximately 5 cm, likely arising from the posterior head. This abuts the posterior superior aspect of the glenoid. This is displaced greater than 1 cm. Fracture involvement through the bicipital groove. There is displaced involvement of the greater tuberosity and nondisplaced fracture of the lesser tuberosity. Fracture involves the surgical and anatomic neck. No additional fracture of the glenoid, scapula or clavicle. There is an associated joint effusion and subcutaneous edema most significant laterally. Edema tracks in the axilla. No fracture of the included left ribs for acute intrathoracic process in the left hemithorax. IMPRESSION: Highly comminuted and displaced proximal humeral head and neck fracture  with involvement of the greater and lesser tuberosities and displaced fragment abutting the posterior superior glenoid. Electronically Signed   By: Jeb Levering M.D.   On: 07/28/2015 23:34   Dg Shoulder Left  07/28/2015  CLINICAL DATA:  Unwitnessed fall this morning, with LEFT shoulder and LEFT hip injury. History of hypertension and diabetes. EXAM: LEFT SHOULDER - 2+ VIEW COMPARISON:  None. FINDINGS: Acute comminuted LEFT humeral head and neck fracture with greater than 3 bony fragments, and impaction. Fragments are generally aligned. No dislocation though, there is mild widening of the subacromial joint space. No destructive bony lesions. Large body habitus. IMPRESSION: Acute nondisplaced LEFT humeral head and neck fracture. Widening subacromial joint space could represent effusion or ligamentous injury without frank dislocation. Electronically Signed   By: Elon Alas M.D.   On: 07/28/2015 22:08   Dg Hip Unilat With Pelvis 2-3 Views Left  07/28/2015  CLINICAL DATA:  Unwitnessed fall this morning, with LEFT shoulder and LEFT hip injury. History of hypertension and diabetes. EXAM: DG HIP (WITH OR WITHOUT PELVIS) 2-3V LEFT COMPARISON:  None. FINDINGS: Comminuted nondisplaced acute LEFT femur intertrochanteric fracture with minimal distraction of the bony fragments. Femoral head is well formed and located. No dislocation. No destructive bony lesions. Surgical clips at pubic symphysis. Phleboliths in the pelvis. IMPRESSION: Acute LEFT intertrochanteric femur fracture with minimal bony distraction. No dislocation. Electronically Signed   By: Elon Alas M.D.   On: 07/28/2015 22:12    EKG:   Orders placed or performed during the hospital encounter of 07/28/15  . EKG 12-Lead  . EKG 12-Lead  . EKG 12-Lead  . EKG 12-Lead    ASSESSMENT AND PLAN:  65 year old Caucasian female history of non-insulin requiring type 2 diabetes essential hypertension hyperlipidemia unspecified presenting with  mechanical fall admitted2/2/17 for left femur intertrochanteric fracture and left humerus nondisplaced fracture. With elevated troponin  1. Elevated troponin: Continued upward trend of enzymes, on therapeutic Lovenox cardiology following input appreciated, echocardiogram pending  2. Preoperative evaluation: Would avoid surgery until cleared from cardiac standpoint  3. Type 2 diabetes non-insulin-requiring: Hold oral agents place on Accu-Chek with sliding scale coverage 4. Left femur intertrochanteric fracture/ Left humerus fracture: Orthopedic surgery Dr. Rudene Christians involved in care will perform surgery was unable  5. Unspecified Hyperlipidemia: Statin therapy  6. Venous thrombi embolism prophylactic: Currently therapeutic Lovenox in setting of #1     All the records are reviewed and case discussed with Care Management/Social Workerr. Management plans discussed with the patient, family and they are in agreement.  CODE STATUS: Full  TOTAL TIME TAKING CARE OF THIS PATIENT: 35 minutes.   POSSIBLE D/C IN 3-4 DAYS, DEPENDING ON CLINICAL CONDITION.   Hower,  Karenann Cai.D on 07/29/2015 at 11:21 AM  Between 7am to 6pm - Pager - [phone]  After 6pm: House Pager: - [facility]  Office  [phone]  CC: Primary care physician; Lenard Simmer, MD

## 2015-07-29 NOTE — Progress Notes (Signed)
CSW presented SNF bed offers to patient in anticipation of STR.  Patient has selected [facility].  [facility] start [place] will submit notes once patient is evaluated by PT.    CSW will continue to follow patient and assist with ongoing and discharge needs  Casimer Lanius. [place] Work Department [phone] 2:48 PM

## 2015-07-29 NOTE — Anesthesia Preprocedure Evaluation (Addendum)
Anesthesia Evaluation  Patient identified by MRN, date of birth, ID band Patient awake    Reviewed: Allergy & Precautions, NPO status , Patient's Chart, lab work & pertinent test results, reviewed documented beta blocker date and time   Airway Mallampati: III  TM Distance: >3 FB     Dental  (+) Chipped   Pulmonary former smoker,           Cardiovascular hypertension, Pt. on medications + Peripheral Vascular Disease       Neuro/Psych PSYCHIATRIC DISORDERS Anxiety Depression    GI/Hepatic GERD  ,  Endo/Other  diabetes, Type 2Hypothyroidism Morbid obesity  Renal/GU      Musculoskeletal  (+) Arthritis ,   Abdominal   Peds  Hematology   Anesthesia Other Findings 2/6 NSTEMI with stent. Plavix 2 days ago. R pneumonia. Anemic Hb 9.1. EKG shows PVCs. Troponin elevated. L arm blue -  Surgeon aware.  Reproductive/Obstetrics                        Anesthesia Physical Anesthesia Plan  ASA: III  Anesthesia Plan:    Post-op Pain Management:    Induction:   Airway Management Planned:   Additional Equipment:   Intra-op Plan:   Post-operative Plan:   Informed Consent: I have reviewed the patients History and Physical, chart, labs and discussed the procedure including the risks, benefits and alternatives for the proposed anesthesia with the patient or authorized representative who has indicated his/her understanding and acceptance.     Plan Discussed with: CRNA  Anesthesia Plan Comments:        Anesthesia Quick Evaluation

## 2015-07-29 NOTE — Progress Notes (Signed)
   Patient's troponin has trended up to 0.72 this afternoon. Continue to cycle. She reports "feeling funny." Ordered stat ECG and CPK. Continue full-dose Lovenox. Modified her echo to be a stat to be done this PM when tech arrives.   Christell Faith, PA-C 07/29/2015 4:21 PM

## 2015-07-29 NOTE — Clinical Social Work Placement (Signed)
   CLINICAL SOCIAL WORK PLACEMENT  NOTE  Date:  07/29/2015  Patient Details  Name: Anita Small MRN: [id number] Date of Birth: May 18, 1951  Clinical Social Work is seeking post-discharge placement for this patient at the [place] level of care (*CSW will initial, date and re-position this form in  chart as items are completed):  Yes   Patient/family provided with [place] Work Department's list of facilities offering this level of care within the geographic area requested by the patient (or if unable, by the patient's family).  Yes   Patient/family informed of their freedom to choose among providers that offer the needed level of care, that participate in Medicare, Medicaid or managed care program needed by the patient, have an available bed and are willing to accept the patient.  Yes   Patient/family informed of [facility]'s ownership interest in [facility] and [facility], as well as of the fact that they are under no obligation to receive care at these facilities.  PASRR submitted to EDS on 07/29/15 (Patient had an expired PASARR. )     PASRR number received on       Existing PASRR number confirmed on       FL2 transmitted to all facilities in geographic area requested by pt/family on 07/29/15     FL2 transmitted to all facilities within larger geographic area on       Patient informed that his/her managed care company has contracts with or will negotiate with certain facilities, including the following:            Patient/family informed of bed offers received.  Patient chooses bed at       Physician recommends and patient chooses bed at      Patient to be transferred to   on  .  Patient to be transferred to facility by       Patient family notified on   of transfer.  Name of family member notified:        PHYSICIAN       Additional Comment:    _______________________________________________ Loralyn Freshwater,  LCSW 07/29/2015, 8:54 AM

## 2015-07-29 NOTE — Progress Notes (Signed)
Surgery cancelled secondary to elevated troponin, will wait for medical clearance.

## 2015-07-29 NOTE — Progress Notes (Addendum)
Troponin I 0.33 - resulting for lab drawn at 5:58am. Reported via [place]. Repeated back.

## 2015-07-29 NOTE — NC FL2 (Addendum)
[place] LEVEL OF CARE SCREENING TOOL     IDENTIFICATION  Patient Name: Anita Small Birthdate: 1950-10-08 Sex: female Admission Date (Current Location): 07/28/2015  [place] and [place] Number:  Engineering geologist and Address:  [facility], [address], [place], [place] [address]      Provider Number: [id number]  Attending Physician Name and Address:  Lytle Butte, MD  Relative Name and Phone Number:       Current Level of Care: Hospital Recommended Level of Care: [place] Prior Approval Number:    Date Approved/Denied:   PASRR Number:   [id number]   Discharge Plan: SNF    Current Diagnoses: Patient Active Problem List   Diagnosis Date Noted  . Fall 07/29/2015  . Fracture of left humerus 07/28/2015  . Femur fracture, left ([place]) 07/28/2015  . Elevated troponin 07/28/2015  . Routine general medical examination at a health care facility 09/19/2010  . HLD (hyperlipidemia) 09/19/2010  . CAROTID ARTERY STENOSIS 07/25/2009  . Type 2 diabetes mellitus ([place]) 11/01/2006  . ANXIETY 11/01/2006  . Hypothyroidism 10/31/2006  . OBESITY 10/31/2006  . Episodic mood disorder ([place]) 10/31/2006  . Essential hypertension 10/31/2006  . GERD 10/31/2006  . Osteoarthritis, multiple sites 10/31/2006    Orientation RESPIRATION BLADDER Height & Weight     Self, Time, Situation, Place  Normal Continent Weight: 262 lb 3.2 oz (118.933 kg) Height:     BEHAVIORAL SYMPTOMS/MOOD NEUROLOGICAL BOWEL NUTRITION STATUS   (none )  (none ) Continent Diet (NPO for surgery )  AMBULATORY STATUS COMMUNICATION OF NEEDS Skin   Extensive Assist Verbally Surgical wounds (Incision: Left Hip and Shoulder )                       Personal Care Assistance Level of Assistance  Bathing, Feeding, Dressing Bathing Assistance: Limited assistance Feeding assistance: Independent Dressing Assistance: Limited assistance     Functional  Limitations Info  Sight, Hearing, Speech Sight Info: Adequate Hearing Info: Adequate Speech Info: Adequate    SPECIAL CARE FACTORS FREQUENCY  PT (By licensed PT), OT (By licensed OT)     PT Frequency:  (5) OT Frequency:  (5)            Contractures      Additional Factors Info  Code Status, Allergies, Insulin Sliding Scale Code Status Info:  (Full Code. ) Allergies Info:  (Codeine Phosphate, Duloxetine, Meperidine Hcl, Venlafaxine, Doxycycline)   Insulin Sliding Scale Info:  (NovoLog Insulin Injections 3 times daily )       Current Medications (07/29/2015):  This is the current hospital active medication list Current Facility-Administered Medications  Medication Dose Route Frequency Provider Last Rate Last Dose  . 0.9 %  sodium chloride infusion   Intravenous Continuous Pavan Pyreddy, MD      . acetaminophen (TYLENOL) tablet 650 mg  650 mg Oral Q6H PRN Saundra Shelling, MD       Or  . acetaminophen (TYLENOL) suppository 650 mg  650 mg Rectal Q6H PRN Pavan Pyreddy, MD      . aspirin chewable tablet 81 mg  81 mg Oral Daily Pavan Pyreddy, MD   81 mg at 07/29/15 1000  . bisacodyl (DULCOLAX) EC tablet 5 mg  5 mg Oral Daily PRN Hessie Knows, MD      . citalopram (CELEXA) tablet 40 mg  40 mg Oral Daily Saundra Shelling, MD   40 mg at 07/29/15 1000  . docusate  sodium (COLACE) capsule 100 mg  100 mg Oral BID Hessie Knows, MD      . enoxaparin (LOVENOX) injection 120 mg  1 mg/kg Subcutaneous Q12H Pavan Pyreddy, MD      . fenofibrate tablet 160 mg  160 mg Oral Daily Pavan Pyreddy, MD   160 mg at 07/29/15 1000  . HYDROcodone-acetaminophen (NORCO/VICODIN) 5-325 MG per tablet 1-2 tablet  1-2 tablet Oral Q4H PRN Hessie Knows, MD   2 tablet at 07/29/15 [phone]  . insulin aspart (novoLOG) injection 0-20 Units  0-20 Units Subcutaneous TID WC Pavan Pyreddy, MD      . insulin aspart (novoLOG) injection 0-5 Units  0-5 Units Subcutaneous QHS Saundra Shelling, MD   0 Units at 07/29/15 0242  . levothyroxine  (SYNTHROID, LEVOTHROID) tablet 100 mcg  100 mcg Oral QAC breakfast Pavan Pyreddy, MD      . linagliptin (TRADJENTA) tablet 5 mg  5 mg Oral Daily Pavan Pyreddy, MD   5 mg at 07/29/15 0800  . magnesium citrate solution 1 Bottle  1 Bottle Oral Once PRN Hessie Knows, MD      . magnesium hydroxide (MILK OF MAGNESIA) suspension 30 mL  30 mL Oral Daily PRN Hessie Knows, MD      . metFORMIN (GLUCOPHAGE) tablet 1,000 mg  1,000 mg Oral BID WC Saundra Shelling, MD   1,000 mg at 07/29/15 0800  . morphine 2 MG/ML injection 2 mg  2 mg Intravenous Q4H PRN Pavan Pyreddy, MD      . ondansetron (ZOFRAN) tablet 4 mg  4 mg Oral Q6H PRN Saundra Shelling, MD       Or  . ondansetron (ZOFRAN) injection 4 mg  4 mg Intravenous Q6H PRN Pavan Pyreddy, MD      . simvastatin (ZOCOR) tablet 40 mg  40 mg Oral QHS Pavan Pyreddy, MD      . sodium chloride flush (NS) 0.9 % injection 3 mL  3 mL Intravenous Q12H Pavan Pyreddy, MD      . zolpidem (AMBIEN) tablet 5 mg  5 mg Oral QHS PRN Hessie Knows, MD         Discharge Medications: Please see discharge summary for a list of discharge medications.  Relevant Imaging Results:  Relevant Lab Results:   Additional Information  (SSN: [id number])  Loralyn Freshwater, LCSW

## 2015-07-29 NOTE — Progress Notes (Signed)
   Troponin has trended upwards to 0.17-->0.33-->0.36. Continue to cycle troponin. Patient is asymptomatic. Unable to obtain stat echo this morning. Discussed case with both Drs. Marcello Moores and [place]. Echo can be obtained this evening. Case will be postponed at this time. MD to review echo when available. Notified Dr. Rudene Christians of the above. Continue Lovenox at this time.  Christell Faith, PA-C 07/29/2015 11:06 AM

## 2015-07-29 NOTE — Care Management Note (Signed)
Case Management Note  Patient Details  Name: Anita Small MRN: [id number] Date of Birth: Apr 25, 1951  Subjective/Objective:                  Met with patient prior to surgery today. She has an injured arm and may need a hemiwalker to assist with ambulation. She has a walker available at home. She lives alone. Supportive neighbor (that found her after she fell). She uses [facility] [address] for Rx.   Action/Plan: List of home health agencies left with patient. RNCM will continue to follow.  Expected Discharge Date:  08/02/15               Expected Discharge Plan:     In-House Referral:     Discharge planning Services  CM Consult  Post Acute Care Choice:  Home Health Choice offered to:  Patient  DME Arranged:    DME Agency:     HH Arranged:    HH Agency:  [place] [facility]  Status of Service:  In process, will continue to follow  Medicare Important Message Given:    Date Medicare IM Given:    Medicare IM give by:    Date Additional Medicare IM Given:    Additional Medicare Important Message give by:     If discussed at [place] of Stay Meetings, dates discussed:    Additional Comments:  Marshell Garfinkel, RN 07/29/2015, 1:41 PM

## 2015-07-29 NOTE — Progress Notes (Signed)
Pt is with a posotive Troponin of 1.12 pt with no complaints of chest pain. Spoke with Dr. Jannifer Franklin no new orders at this time.

## 2015-07-29 NOTE — Progress Notes (Signed)
*  PRELIMINARY RESULTS* Echocardiogram 2D Echocardiogram has been performed.  Anita Small 07/29/2015, 6:08 PM

## 2015-07-29 NOTE — Consult Note (Signed)
Cardiology Consultation Note  Patient ID: Anita Small, MRN: [id number], DOB/AGE: 10-10-1950 65 y.o. Admit date: 07/28/2015   Date of Consult: 07/29/2015 Primary Physician: Lenard Simmer, MD Primary Cardiologist: New to [facility]  Chief Complaint: Mechanical fall leading to left femur intertrochanteric fracture and left humerus nondisplaced fracture, on the floor for 14+ hours Reason for Consult: Elevated troponin without any anginal symptoms and surgical clearance   HPI: 65 y.o. female with h/o carotid stenosis status post CEA vs stenting per patient, DM2, HTN, HLD, prior tobacco abuse, obesity, and hypothyroidism who presented to [facility] s/p mechanical fall and was down on the floor for 14 + hours. She suffered a left femur intertrochanteric fracture and left humerus nondisplaced fracture. Never with any anginal symptoms. Troponin was checked and found to be 0.17. She was started on full dose Lovenox. She does not have any previously known cardiac history though does report having had a prior cardiac cath by Dr. Clayborn Bigness in the 1990's that is self reportedly normal. She had an echo done in 02/2015 that showed EF 60%, mild mitral stenosis and aortic stenosis, left atrium mildly dialted.    ECG showed sinus tachycardia with nonspecific st/t changes, CXR showed no acute process. Left hip showed left intertrochanteric fracture with minimal bony distraction. Left shoulder with acute humeral head and neck fracture. She needs surgery repair of her hip fracture.   Past Medical History  Diagnosis Date  . Depression     Paranoid tendencies  . Diabetes mellitus     Type II  . GERD (gastroesophageal reflux disease)   . Hypertension   . Anxiety   . Arthritis     Left knee  . Adenomatous polyp 1999  . Hyperlipidemia   . Hypothyroidism   . Obesity   . Diverticulosis       Most Recent Cardiac Studies: As above   Surgical History:  Past Surgical History  Procedure Laterality Date  . Heel spur  surgery  03/03    Left  . Knee arthroscopy  2001    Left   Tamala Julian)  . Abdominal hysterectomy  1977    One ovary left  . Bladder repair  1977    Tacking  . Cervical spine surgery  1999  . Ct abd w & pelvis wo cm  2002    Negative. Pituitary normal on MRI  . Carotid endarterectomy  2/11    Dr Jamal Collin  . Cardiovascular stress test  7/00    negative. No cardiolite  . Transthoracic echocardiogram  3/04    normal  . Appendectomy    . Cholecystectomy    . Total knee arthroplasty  11/12    left--Dr Tamala Julian     Home Meds: Prior to Admission medications   Medication Sig Start Date End Date Taking? Authorizing Provider  aspirin 81 MG chewable tablet Chew 81 mg by mouth daily.   Yes Historical Provider, MD  canagliflozin (INVOKANA) 300 MG TABS tablet Take 300 mg by mouth daily.   Yes Historical Provider, MD  cetirizine (ZYRTEC) 10 MG tablet Take 10 mg by mouth at bedtime.   Yes Historical Provider, MD  citalopram (CELEXA) 40 MG tablet Take 40 mg by mouth daily.   Yes Historical Provider, MD  clopidogrel (PLAVIX) 75 MG tablet Take 75 mg by mouth daily.   Yes Historical Provider, MD  fenofibrate (TRICOR) 145 MG tablet Take 145 mg by mouth daily.   Yes Historical Provider, MD  hydrochlorothiazide (HYDRODIURIL) 25 MG tablet Take 1  tablet (25 mg total) by mouth daily. Take everyday with Lisinopril Patient taking differently: Take 25 mg by mouth daily.  02/25/14  Yes Venia Carbon, MD  insulin lispro (HUMALOG) 100 UNIT/ML injection Inject 50 Units into the skin 3 (three) times daily.   Yes Historical Provider, MD  levothyroxine (SYNTHROID, LEVOTHROID) 100 MCG tablet Take 1 tablet (100 mcg total) by mouth daily. 02/25/14  Yes Venia Carbon, MD  lisinopril (PRINIVIL,ZESTRIL) 20 MG tablet Take 1 tablet (20 mg total) by mouth daily. Take everyday with HCTZ Patient taking differently: Take 20 mg by mouth daily.  02/25/14  Yes Venia Carbon, MD  nystatin (MYCOSTATIN/NYSTOP) 100000 UNIT/GM POWD Apply  topically 2 (two) times daily.   Yes Historical Provider, MD  simvastatin (ZOCOR) 40 MG tablet Take 40 mg by mouth at bedtime.   Yes Historical Provider, MD  sitaGLIPtin-metformin (JANUMET) 50-1000 MG per tablet Take 1 tablet by mouth 2 (two) times daily with a meal. 02/25/14  Yes Venia Carbon, MD  traZODone (DESYREL) 100 MG tablet Take 1-2 tablets (100-200 mg total) by mouth at bedtime as needed. Patient taking differently: Take 100 mg by mouth at bedtime.  08/14/13  Yes Venia Carbon, MD  triamcinolone cream (KENALOG) 0.1 % Apply 1 application topically 2 (two) times daily as needed (for irritation).   Yes Historical Provider, MD    Inpatient Medications:  . aspirin  81 mg Oral Daily  . citalopram  40 mg Oral Daily  . docusate sodium  100 mg Oral BID  . enoxaparin (LOVENOX) injection  1 mg/kg Subcutaneous Q12H  . fenofibrate  160 mg Oral Daily  . insulin aspart  0-20 Units Subcutaneous TID WC  . insulin aspart  0-5 Units Subcutaneous QHS  . levothyroxine  100 mcg Oral QAC breakfast  . linagliptin  5 mg Oral Daily  . metFORMIN  1,000 mg Oral BID WC  . simvastatin  40 mg Oral QHS  . sodium chloride flush  3 mL Intravenous Q12H   . sodium chloride      Allergies:  Allergies  Allergen Reactions  . Codeine Phosphate     REACTION: unspecified  . Duloxetine     REACTION: unspecified  . Meperidine Hcl     REACTION: unspecified  . Venlafaxine     REACTION: unspecified  . Doxycycline Rash    Social History   Social History  . Marital Status: Widowed    Spouse Name: N/A  . Number of Children: 3  . Years of Education: N/A   Occupational History  . Receptionist---retired     [place]  .     [employer]      Social History Main Topics  . Smoking status: Former Smoker    Types: Cigarettes  . Smokeless tobacco: Never Used  . Alcohol Use: No  . Drug Use: No  . Sexual Activity: Not on file   Other Topics Concern  . Not on file   Social History Narrative   Widowed  07/02.  Kids have moved out and now living with friend to share expenses.      3 caffeine drinks daily      Family History  Problem Relation Age of Onset  . Cancer Mother     Breast  . Heart disease Mother   . Diabetes Mother   . Heart disease Father   . Diabetes Father   . Hypertension Sister   . Cancer Paternal Grandmother     ?  throat CA  . Depression Daughter   . Colon cancer      Unsure if mother had it   . Colon polyps Mother      Review of Systems: Review of Systems  Constitutional: Positive for malaise/fatigue. Negative for fever, chills, weight loss and diaphoresis.  HENT: Negative for congestion.   Eyes: Negative for discharge and redness.  Respiratory: Negative for cough, hemoptysis, sputum production, shortness of breath and wheezing.   Cardiovascular: Negative for chest pain, palpitations, orthopnea, claudication, leg swelling and PND.  Gastrointestinal: Negative for nausea, vomiting and abdominal pain.  Musculoskeletal: Positive for myalgias, joint pain and falls. Negative for back pain and neck pain.  Skin: Negative for rash.  Neurological: Positive for weakness. Negative for dizziness, sensory change, speech change, focal weakness and loss of consciousness.  Endo/Heme/Allergies: Does not bruise/bleed easily.  Psychiatric/Behavioral: Positive for substance abuse. The patient is not nervous/anxious.      Labs:  Recent Labs  07/28/15 2104  CKTOTAL 226  TROPONINI 0.17*   Lab Results  Component Value Date   WBC 10.7 07/29/2015   HGB 11.3* 07/29/2015   HCT 34.8* 07/29/2015   MCV 86.6 07/29/2015   PLT 271 07/29/2015    Recent Labs Lab 07/28/15 2104  NA 141  K 4.0  CL 106  CO2 22  BUN 25*  CREATININE 1.00  CALCIUM 8.8*  PROT 7.3  BILITOT 0.7  ALKPHOS 53  ALT 34  AST 26  GLUCOSE 217*   Lab Results  Component Value Date   CHOL 135 06/12/2014   HDL 28* 06/12/2014   LDLCALC 49 06/12/2014   TRIG 289* 06/12/2014   No results found for:  DDIMER  Radiology/Studies:  Dg Chest 1 View  07/28/2015  CLINICAL DATA:  Unwitnessed fall with left shoulder and hip injury. EXAM: CHEST 1 VIEW COMPARISON:  03/16/2015 FINDINGS: The cardiomediastinal contours are unchanged with atherosclerosis of the aortic arch. The lungs are clear. Pulmonary vasculature is normal. No consolidation, pleural effusion, or pneumothorax. Left proximal humerus fracture, partially included. IMPRESSION: 1.  No acute pulmonary process. 2. Left proximal humerus fracture, partially included. Electronically Signed   By: Jeb Levering M.D.   On: 07/28/2015 22:02   Ct Shoulder Left Wo Contrast  07/28/2015  CLINICAL DATA:  Left humeral head and neck fracture. Fall with subsequent left shoulder pain. EXAM: CT OF THE LEFT SHOULDER WITHOUT CONTRAST TECHNIQUE: Multidetector CT imaging was performed according to the standard protocol. Multiplanar CT image reconstructions were also generated. COMPARISON:  Radiographs earlier this day. FINDINGS: Highly comminuted and displaced fracture of the humeral head and neck. There is a curvilinear fragment displaced posterior medially measuring approximately 5 cm, likely arising from the posterior head. This abuts the posterior superior aspect of the glenoid. This is displaced greater than 1 cm. Fracture involvement through the bicipital groove. There is displaced involvement of the greater tuberosity and nondisplaced fracture of the lesser tuberosity. Fracture involves the surgical and anatomic neck. No additional fracture of the glenoid, scapula or clavicle. There is an associated joint effusion and subcutaneous edema most significant laterally. Edema tracks in the axilla. No fracture of the included left ribs for acute intrathoracic process in the left hemithorax. IMPRESSION: Highly comminuted and displaced proximal humeral head and neck fracture with involvement of the greater and lesser tuberosities and displaced fragment abutting the posterior  superior glenoid. Electronically Signed   By: Jeb Levering M.D.   On: 07/28/2015 23:34   Dg Shoulder Left  07/28/2015  CLINICAL DATA:  Unwitnessed fall this morning, with LEFT shoulder and LEFT hip injury. History of hypertension and diabetes. EXAM: LEFT SHOULDER - 2+ VIEW COMPARISON:  None. FINDINGS: Acute comminuted LEFT humeral head and neck fracture with greater than 3 bony fragments, and impaction. Fragments are generally aligned. No dislocation though, there is mild widening of the subacromial joint space. No destructive bony lesions. Large body habitus. IMPRESSION: Acute nondisplaced LEFT humeral head and neck fracture. Widening subacromial joint space could represent effusion or ligamentous injury without frank dislocation. Electronically Signed   By: Elon Alas M.D.   On: 07/28/2015 22:08   Dg Hip Unilat With Pelvis 2-3 Views Left  07/28/2015  CLINICAL DATA:  Unwitnessed fall this morning, with LEFT shoulder and LEFT hip injury. History of hypertension and diabetes. EXAM: DG HIP (WITH OR WITHOUT PELVIS) 2-3V LEFT COMPARISON:  None. FINDINGS: Comminuted nondisplaced acute LEFT femur intertrochanteric fracture with minimal distraction of the bony fragments. Femoral head is well formed and located. No dislocation. No destructive bony lesions. Surgical clips at pubic symphysis. Phleboliths in the pelvis. IMPRESSION: Acute LEFT intertrochanteric femur fracture with minimal bony distraction. No dislocation. Electronically Signed   By: Elon Alas M.D.   On: 07/28/2015 22:12    EKG: sinus tachycardia, 102 bpm, with nonspecific st/t changes  Weights: Filed Weights   07/29/15 0242  Weight: 262 lb 3.2 oz (118.933 kg)     Physical Exam: Blood pressure 125/62, pulse 110, temperature 98.2 F (36.8 C), temperature source Oral, resp. rate 18, weight 262 lb 3.2 oz (118.933 kg), SpO2 96 %. Body mass index is 47.94 kg/(m^2). General: Well developed, well nourished, in no acute  distress. Head: Normocephalic, atraumatic, sclera non-icteric, no xanthomas, nares are without discharge.  Neck: Negative for carotid bruits. JVD not elevated. Lungs: Clear bilaterally to auscultation without wheezes, rales, or rhonchi. Breathing is unlabored. Heart: Tachycardic, with S1 S2. No murmurs, rubs, or gallops appreciated. Abdomen: obese, soft, non-tender, non-distended with normoactive bowel sounds. No hepatomegaly. No rebound/guarding. No obvious abdominal masses. Msk:  Strength and tone appear normal for age. Extremities: No clubbing or cyanosis. No edema. Left left wrapped. Left shoulder with significant bruising.  Neuro: Alert and oriented X 3. No facial asymmetry. No focal deficit. Moves all extremities spontaneously. Psych:  Responds to questions appropriately with a normal affect.    Assessment and Plan:   1. Elevated troponin: -Patient suffered a mechanical fall and was down on the floor for 14+ hours with her fracture left hip and left shoulder. She had taken off her Life Alert button and could not reach to telephone -With her being on the floor for such a significant this could explain her mildly elevated troponin of 0.17. Second troponin is pending at this time. If this second troponin does not trend significantly upwards she should be ok to proceed with non-cardiac surgery at moderate risk  -Echo from 02/2015 as above with normal LV systolic function  -Ok to hold full dose Lovenox for surgery  2. Left hip fracture/left proximal humerus fracture: -She is for surgery today of the left hip per ortho as above -Possible left shoulder replacement at a later date per ortho  3. Sinus tachycardia: -Possibly 2/2 her pain -Continue to monitor -She is at high risk of rhabdomyolysis given her time on the floor, will add CPK, if elevated she may need IV fluids   4. HTN: -Controlled -Continue current medications   Signed, Christell Faith, PA-C Pager: [phone] 07/29/2015,  8:21 AM

## 2015-07-29 NOTE — ED Notes (Signed)
Pt resting in bed, denies any needs. Pt reports "feeling much better".

## 2015-07-29 NOTE — H&P (Signed)
[facility] [place] NAME: Anita Small    MR#:  [id number]  DATE OF BIRTH:  [birth year]  DATE OF ADMISSION:  07/28/2015  PRIMARY CARE PHYSICIAN: Lenard Simmer, MD   REQUESTING/REFERRING PHYSICIAN:   CHIEF COMPLAINT:   Chief Complaint  Patient presents with  . Fall    HISTORY OF PRESENT ILLNESS: Anita Small  is a 65 y.o. female with a known history of type 2 diabetes mellitus, GERD, hypertension, hyperlipidemia, diverticulosis had a fall in her bathroom and 1 AM on Thursday morning. Patient was lying on the floor for several hours. Her telephone was in the bedroom and she could not reach it. She did not pass out or lose consciousness. Patient kept screaming and her neighbor responded in the afternoon probably around 3:30 PM and EMS was called and later on patient was brought to the emergency room. When patient fell down she landed on left side of the body hitting the left shoulder and left hip area to the ground. Patient has an ecchymosis and swelling of the left shoulder. She has pain in the left shoulder and left hip. Pain is aching in nature 8 out of 10 on a scale of 1-10. Pain is relieved with an analgesic medication. During the workup in the emergency room imaging study showed left femur intertrochanteric fracture and left humerus fracture. Patient also has elevated troponin. No complaints of chest pain. No history of any shortness of breath. No orthopnea or paroxysmal nocturnal dyspnea.  PAST MEDICAL HISTORY:   Past Medical History  Diagnosis Date  . Depression     Paranoid tendencies  . Diabetes mellitus     Type II  . GERD (gastroesophageal reflux disease)   . Hypertension   . Anxiety   . Arthritis     Left knee  . Adenomatous polyp 1999  . Hyperlipidemia   . Hypothyroidism   . Obesity   . Diverticulosis     PAST SURGICAL HISTORY: Past Surgical History  Procedure Laterality Date  . Heel spur surgery  03/03     Left  . Knee arthroscopy  2001    Left   Tamala Julian)  . Abdominal hysterectomy  1977    One ovary left  . Bladder repair  1977    Tacking  . Cervical spine surgery  1999  . Ct abd w & pelvis wo cm  2002    Negative. Pituitary normal on MRI  . Carotid endarterectomy  2/11    Dr Jamal Collin  . Cardiovascular stress test  7/00    negative. No cardiolite  . Transthoracic echocardiogram  3/04    normal  . Appendectomy    . Cholecystectomy    . Total knee arthroplasty  11/12    left--Dr Tamala Julian    SOCIAL HISTORY:  Social History  Substance Use Topics  . Smoking status: Former Smoker    Types: Cigarettes  . Smokeless tobacco: Never Used  . Alcohol Use: No    FAMILY HISTORY:  Family History  Problem Relation Age of Onset  . Cancer Mother     Breast  . Heart disease Mother   . Diabetes Mother   . Heart disease Father   . Diabetes Father   . Hypertension Sister   . Cancer Paternal Grandmother     ? throat CA  . Depression Daughter   . Colon cancer      Unsure if mother had it   . Colon polyps Mother  DRUG ALLERGIES:  Allergies  Allergen Reactions  . Codeine Phosphate     REACTION: unspecified  . Duloxetine     REACTION: unspecified  . Meperidine Hcl     REACTION: unspecified  . Venlafaxine     REACTION: unspecified  . Doxycycline Rash    REVIEW OF SYSTEMS:   CONSTITUTIONAL: No fever, has weakness.  EYES: No blurred or double vision.  EARS, NOSE, AND THROAT: No tinnitus or ear pain.  RESPIRATORY: No cough, shortness of breath, wheezing or hemoptysis.  CARDIOVASCULAR: No chest pain, orthopnea, edema.  GASTROINTESTINAL: No nausea, vomiting, diarrhea or abdominal pain.  GENITOURINARY: No dysuria, hematuria.  ENDOCRINE: No polyuria, nocturia,  HEMATOLOGY: No anemia, easy bruising or bleeding SKIN: ecchymosis left shoulder area. MUSCULOSKELETAL: Left hip and left shoulder joint pain. NEUROLOGIC: No tingling, numbness, weakness.  PSYCHIATRY: No anxiety or  depression.   MEDICATIONS AT HOME:  Prior to Admission medications   Medication Sig Start Date End Date Taking? Authorizing Provider  aspirin 81 MG chewable tablet Chew 81 mg by mouth daily.   Yes Historical Provider, MD  canagliflozin (INVOKANA) 300 MG TABS tablet Take 300 mg by mouth daily.   Yes Historical Provider, MD  cetirizine (ZYRTEC) 10 MG tablet Take 10 mg by mouth at bedtime.   Yes Historical Provider, MD  citalopram (CELEXA) 40 MG tablet Take 40 mg by mouth daily.   Yes Historical Provider, MD  clopidogrel (PLAVIX) 75 MG tablet Take 75 mg by mouth daily.   Yes Historical Provider, MD  fenofibrate (TRICOR) 145 MG tablet Take 145 mg by mouth daily.   Yes Historical Provider, MD  hydrochlorothiazide (HYDRODIURIL) 25 MG tablet Take 1 tablet (25 mg total) by mouth daily. Take everyday with Lisinopril Patient taking differently: Take 25 mg by mouth daily.  02/25/14  Yes Venia Carbon, MD  insulin lispro (HUMALOG) 100 UNIT/ML injection Inject 50 Units into the skin 3 (three) times daily.   Yes Historical Provider, MD  levothyroxine (SYNTHROID, LEVOTHROID) 100 MCG tablet Take 1 tablet (100 mcg total) by mouth daily. 02/25/14  Yes Venia Carbon, MD  lisinopril (PRINIVIL,ZESTRIL) 20 MG tablet Take 1 tablet (20 mg total) by mouth daily. Take everyday with HCTZ Patient taking differently: Take 20 mg by mouth daily.  02/25/14  Yes Venia Carbon, MD  nystatin (MYCOSTATIN/NYSTOP) 100000 UNIT/GM POWD Apply topically 2 (two) times daily.   Yes Historical Provider, MD  simvastatin (ZOCOR) 40 MG tablet Take 40 mg by mouth at bedtime.   Yes Historical Provider, MD  sitaGLIPtin-metformin (JANUMET) 50-1000 MG per tablet Take 1 tablet by mouth 2 (two) times daily with a meal. 02/25/14  Yes Venia Carbon, MD  traZODone (DESYREL) 100 MG tablet Take 1-2 tablets (100-200 mg total) by mouth at bedtime as needed. Patient taking differently: Take 100 mg by mouth at bedtime.  08/14/13  Yes Venia Carbon, MD   triamcinolone cream (KENALOG) 0.1 % Apply 1 application topically 2 (two) times daily as needed (for irritation).   Yes Historical Provider, MD      PHYSICAL EXAMINATION:   VITAL SIGNS: Blood pressure 126/63, pulse 112, temperature 99.3 F (37.4 C), temperature source Oral, resp. rate 22, SpO2 98 %.  GENERAL:  65 y.o.-year-old obese patient lying in the bed in mild distress.  EYES: Pupils equal, round, reactive to light and accommodation. No scleral icterus. Extraocular muscles intact.  HEENT: Head atraumatic, normocephalic. Oropharynx and nasopharynx clear.  NECK:  Supple, no jugular venous distention. No thyroid  enlargement, no tenderness.  LUNGS: Normal breath sounds bilaterally, no wheezing, rales,rhonchi or crepitation. No use of accessory muscles of respiration.  CARDIOVASCULAR: S1, S2 normal. No murmurs, rubs, or gallops.  ABDOMEN:obese, Soft, nontender, nondistended. Bowel sounds present. No organomegaly or mass.  EXTREMITIES: No pedal edema, cyanosis, or clubbing. Has swelling and ecchymosis around left shoulder and left arm area. NEUROLOGIC: Cranial nerves II through XII are intact. Muscle strength 5/5 in all extremities. Sensation intact. Gait could not be checked.  PSYCHIATRIC: The patient is alert and oriented x 3.  SKIN: Ecchymosis over left arm and left shoulder area.  LABORATORY PANEL:   CBC  Recent Labs Lab 07/28/15 2104  WBC 13.4*  HGB 12.6  HCT 39.1  PLT 284  MCV 85.7  MCH 27.6  MCHC 32.2  RDW 16.0*  LYMPHSABS 1.0  MONOABS 0.6  EOSABS 0.0  BASOSABS 0.1   ------------------------------------------------------------------------------------------------------------------  Chemistries   Recent Labs Lab 07/28/15 2104  NA 141  K 4.0  CL 106  CO2 22  GLUCOSE 217*  BUN 25*  CREATININE 1.00  CALCIUM 8.8*  AST 26  ALT 34  ALKPHOS 53  BILITOT 0.7    ------------------------------------------------------------------------------------------------------------------ CrCl cannot be calculated (Unknown ideal weight.). ------------------------------------------------------------------------------------------------------------------ No results for input(s): TSH, T4TOTAL, T3FREE, THYROIDAB in the last 72 hours.  Invalid input(s): FREET3   Coagulation profile No results for input(s): INR, PROTIME in the last 168 hours. ------------------------------------------------------------------------------------------------------------------- No results for input(s): DDIMER in the last 72 hours. -------------------------------------------------------------------------------------------------------------------  Cardiac Enzymes  Recent Labs Lab 07/28/15 2104  TROPONINI 0.17*   ------------------------------------------------------------------------------------------------------------------ Invalid input(s): POCBNP  ---------------------------------------------------------------------------------------------------------------  Urinalysis    Component Value Date/Time   COLORURINE STRAW* 07/28/2015 2117   COLORURINE Yellow 06/11/2014 1845   APPEARANCEUR CLEAR* 07/28/2015 2117   APPEARANCEUR Clear 06/11/2014 1845   LABSPEC 1.031* 07/28/2015 2117   LABSPEC 1.038 06/11/2014 1845   PHURINE 5.0 07/28/2015 2117   PHURINE 5.0 06/11/2014 1845   GLUCOSEU >500* 07/28/2015 2117   GLUCOSEU Negative 06/11/2014 1845   HGBUR NEGATIVE 07/28/2015 2117   HGBUR Negative 06/11/2014 1845   HGBUR negative 06/01/2009 1556   BILIRUBINUR NEGATIVE 07/28/2015 2117   BILIRUBINUR Negative 06/11/2014 1845   BILIRUBINUR neg 12/08/2010 1543   KETONESUR TRACE* 07/28/2015 2117   KETONESUR Negative 06/11/2014 1845   PROTEINUR NEGATIVE 07/28/2015 2117   PROTEINUR Negative 06/11/2014 1845   PROTEINUR trace 12/08/2010 1543   UROBILINOGEN 0.2 12/08/2010 1543    UROBILINOGEN 0.2 06/01/2009 1556   NITRITE NEGATIVE 07/28/2015 2117   NITRITE Negative 06/11/2014 1845   NITRITE neg 12/08/2010 1543   LEUKOCYTESUR NEGATIVE 07/28/2015 2117   LEUKOCYTESUR Negative 06/11/2014 1845     RADIOLOGY: Dg Chest 1 View  07/28/2015  CLINICAL DATA:  Unwitnessed fall with left shoulder and hip injury. EXAM: CHEST 1 VIEW COMPARISON:  03/16/2015 FINDINGS: The cardiomediastinal contours are unchanged with atherosclerosis of the aortic arch. The lungs are clear. Pulmonary vasculature is normal. No consolidation, pleural effusion, or pneumothorax. Left proximal humerus fracture, partially included. IMPRESSION: 1.  No acute pulmonary process. 2. Left proximal humerus fracture, partially included. Electronically Signed   By: Jeb Levering M.D.   On: 07/28/2015 22:02   Ct Shoulder Left Wo Contrast  07/28/2015  CLINICAL DATA:  Left humeral head and neck fracture. Fall with subsequent left shoulder pain. EXAM: CT OF THE LEFT SHOULDER WITHOUT CONTRAST TECHNIQUE: Multidetector CT imaging was performed according to the standard protocol. Multiplanar CT image reconstructions were also generated. COMPARISON:  Radiographs earlier this day. FINDINGS:  Highly comminuted and displaced fracture of the humeral head and neck. There is a curvilinear fragment displaced posterior medially measuring approximately 5 cm, likely arising from the posterior head. This abuts the posterior superior aspect of the glenoid. This is displaced greater than 1 cm. Fracture involvement through the bicipital groove. There is displaced involvement of the greater tuberosity and nondisplaced fracture of the lesser tuberosity. Fracture involves the surgical and anatomic neck. No additional fracture of the glenoid, scapula or clavicle. There is an associated joint effusion and subcutaneous edema most significant laterally. Edema tracks in the axilla. No fracture of the included left ribs for acute intrathoracic process in the  left hemithorax. IMPRESSION: Highly comminuted and displaced proximal humeral head and neck fracture with involvement of the greater and lesser tuberosities and displaced fragment abutting the posterior superior glenoid. Electronically Signed   By: Jeb Levering M.D.   On: 07/28/2015 23:34   Dg Shoulder Left  07/28/2015  CLINICAL DATA:  Unwitnessed fall this morning, with LEFT shoulder and LEFT hip injury. History of hypertension and diabetes. EXAM: LEFT SHOULDER - 2+ VIEW COMPARISON:  None. FINDINGS: Acute comminuted LEFT humeral head and neck fracture with greater than 3 bony fragments, and impaction. Fragments are generally aligned. No dislocation though, there is mild widening of the subacromial joint space. No destructive bony lesions. Large body habitus. IMPRESSION: Acute nondisplaced LEFT humeral head and neck fracture. Widening subacromial joint space could represent effusion or ligamentous injury without frank dislocation. Electronically Signed   By: Elon Alas M.D.   On: 07/28/2015 22:08   Dg Hip Unilat With Pelvis 2-3 Views Left  07/28/2015  CLINICAL DATA:  Unwitnessed fall this morning, with LEFT shoulder and LEFT hip injury. History of hypertension and diabetes. EXAM: DG HIP (WITH OR WITHOUT PELVIS) 2-3V LEFT COMPARISON:  None. FINDINGS: Comminuted nondisplaced acute LEFT femur intertrochanteric fracture with minimal distraction of the bony fragments. Femoral head is well formed and located. No dislocation. No destructive bony lesions. Surgical clips at pubic symphysis. Phleboliths in the pelvis. IMPRESSION: Acute LEFT intertrochanteric femur fracture with minimal bony distraction. No dislocation. Electronically Signed   By: Elon Alas M.D.   On: 07/28/2015 22:12    EKG: Orders placed or performed during the hospital encounter of 07/28/15  . EKG 12-Lead  . EKG 12-Lead  . EKG 12-Lead  . EKG 12-Lead    IMPRESSION AND PLAN: 65 year old obese female patient with history of  type 2 diabetes mellitus, hypertension, hyperlipidemia, GERD, hypothyroidism sustained a fall. Patient fell on her left hip and left shoulder and has a left femur intertrochanteric fracture and left humerus nondisplaced fracture. During the workup patient was found to have elevated troponin. No complaints of any chest pain. Admitting diagnosis 1. Accidental fall 2. Left femur intertrochanteric fracture 3. Left humerus fracture 4. Elevated troponin 5. Type 2 diabetes mellitus 6. Hyperlipidemia Treatment plan Admit patient to telemetry under inpatient service Orthopedic consultation Cardiology consultation for elevated troponin Anticoagulate patient with full dose Lovenox, cycle troponin and resume aspirin. We will defer to cardiology regarding the start of Plavix considering that the patient will need to have hip repair. Monitor patient on telemetry Pain management Supportive care.  All the records are reviewed and case discussed with ED provider. Management plans discussed with the patient, family and they are in agreement.  CODE STATUS:FULL    Code Status Orders        Start     Ordered   07/28/15 2225  Full code  Continuous     07/28/15 2234    Code Status History    Date Active Date Inactive Code Status Order ID Comments User Context   This patient has a current code status but no historical code status.    Advance Directive Documentation        Most Recent Value   Type of Advance Directive  Healthcare Power of Attorney, Living will   Pre-existing out of facility DNR order (yellow form or pink MOST form)     "MOST" Form in Place?         TOTAL TIME TAKING CARE OF THIS PATIENT: 60 minutes.    Saundra Shelling M.D on 07/29/2015 at 12:36 AM  Between 7am to 6pm - Pager - [phone]  After 6pm go to [email or web address] - password [email or web address] [facility]  Office  [phone]  CC: Primary care physician; Lenard Simmer, MD

## 2015-07-29 NOTE — Clinical Social Work Note (Addendum)
Clinical Social Work Assessment  Patient Details  Name: Anita Small MRN: [id number] Date of Birth: 01/10/1951  Date of referral:  07/29/15               Reason for consult:  Facility Placement                Permission sought to share information with:  Chartered certified accountant granted to share information::  Yes, Verbal Permission Granted  Name::      [place]::   [place]   Relationship::     Contact Information:     Housing/Transportation Living arrangements for the past 2 months:  [place] of Information:  Patient Patient Interpreter Needed:  None Criminal Activity/Legal Involvement Pertinent to Current Situation/Hospitalization:  No - Comment as needed Significant Relationships:  Adult Children, Neighbor Lives with:  Self Do you feel safe going back to the place where you live?  Yes Need for family participation in patient care:  Yes (Comment)  Care giving concerns:  Patient lives alone in [place].    Social Worker assessment / plan:  Patient is pending surgery for hip and shoulder fracture with Dr. Rudene Christians. Clinical Social Worker (CSW) met with patient prior to surgery. Patient was alert and oriented and laying in the bed. CSW introduced self and explained role of CSW department. Patient reported that she lives alone in [place]. Per patient she has 3 adult children that live in [place]. Per patient her children provide limited support because they all work. Patient reported that she fell at her home in [facility] walking to the bathroom. Per patient she yelled for help for several hours and her neighbors came and broke down the door and called 911. Patient reported that she gave her neighbors permission to break down her door. CSW provided emotional support. CSW explained that after surgery PT will work with patient and make a recommendation of home health or SNF. Patient reported that she prefers to return home  with [facility]. 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 Patient is agreeable to going to SNF "if she has to." Per patient she went to [facility] in 2012 after a knee replacement and does not want to go back there. CSW explained that patient's insurance [employer] will have to approve SNF stay. Patient verbalized her understanding.   FL2 complete and faxed out. PASARR pending. FL2, H&P and 30 day note faxed to [facility].   Employment status:  Retired Nurse, adult PT Recommendations:  Not assessed at this time Information / Referral to community resources:  [place]  Patient/Family's Response to care:  Patient prefers to go home however is open to SNF.   Patient/Family's Understanding of and Emotional Response to Diagnosis, Current Treatment, and Prognosis:  Patient was pleasant throughout assessment and thanked CSW for visit.   Emotional Assessment Appearance:  Appears stated age Attitude/Demeanor/Rapport:    Affect (typically observed):  Accepting, Adaptable, Quiet Orientation:  Oriented to Self, Oriented to Place, Oriented to  Time, Oriented to Situation Alcohol / Substance use:  Not Applicable Psych involvement (Current and /or in the community):  No (Comment)  Discharge Needs  Concerns to be addressed:  Discharge Planning Concerns Readmission within the last 30 days:  No Current discharge risk:  Dependent with Mobility Barriers to Discharge:  Continued Medical Work up   Loralyn Freshwater, LCSW 07/29/2015, 8:55  AM

## 2015-07-30 ENCOUNTER — Inpatient Hospital Stay: Payer: Medicare HMO

## 2015-07-30 LAB — BASIC METABOLIC PANEL
ANION GAP: 11 (ref 5–15)
BUN: 20 mg/dL (ref 6–20)
CO2: 24 mmol/L (ref 22–32)
Calcium: 8.3 mg/dL — ABNORMAL LOW (ref 8.9–10.3)
Chloride: 104 mmol/L (ref 101–111)
Creatinine, Ser: 0.85 mg/dL (ref 0.44–1.00)
GFR calc Af Amer: >60 mL/min (ref >60)
GLUCOSE: 241 mg/dL — AB (ref 65–99)
POTASSIUM: 4.1 mmol/L (ref 3.5–5.1)
Sodium: 139 mmol/L (ref 135–145)

## 2015-07-30 LAB — GLUCOSE, CAPILLARY
GLUCOSE-CAPILLARY: 185 mg/dL — AB (ref 65–99)
Glucose-Capillary: 167 mg/dL — ABNORMAL HIGH (ref 65–99)
Glucose-Capillary: 260 mg/dL — ABNORMAL HIGH (ref 65–99)
Glucose-Capillary: 173 mg/dL — ABNORMAL HIGH (ref 65–99)

## 2015-07-30 LAB — TROPONIN I
TROPONIN I: 1.33 ng/mL — AB (ref <0.031)
TROPONIN I: 1.22 ng/mL — AB (ref <0.031)

## 2015-07-30 LAB — CBC
HEMATOCRIT: 31.5 % — AB (ref 35.0–47.0)
HEMOGLOBIN: 10.2 g/dL — AB (ref 12.0–16.0)
MCH: 28.1 pg (ref 26.0–34.0)
MCHC: 32.3 g/dL (ref 32.0–36.0)
MCV: 87 fL (ref 80.0–100.0)
Platelets: 225 10*3/uL (ref 150–440)
RBC: 3.62 MIL/uL — AB (ref 3.80–5.20)
RDW: 16.2 % — ABNORMAL HIGH (ref 11.5–14.5)
WBC: 11 10*3/uL (ref 3.6–11.0)

## 2015-07-30 LAB — FIBRIN DERIVATIVES D-DIMER (ARMC ONLY): Fibrin derivatives D-dimer (ARMC): 1071 — ABNORMAL HIGH (ref 0–499)

## 2015-07-30 MED ORDER — IPRATROPIUM-ALBUTEROL 0.5-2.5 (3) MG/3ML IN SOLN: 3.0000 mL | RESPIRATORY_TRACT | Status: DC | PRN

## 2015-07-30 MED ORDER — IOHEXOL 350 MG/ML SOLN
75.0000 mL | Freq: Once | INTRAVENOUS | Status: AC | PRN
  Administered 2015-07-30: 75 mL via INTRAVENOUS

## 2015-07-30 NOTE — Progress Notes (Signed)
Patient: Anita Small / Admit Date: 07/28/2015 / Date of Encounter: 07/30/2015, 11:28 AM   Subjective: Troponin has trended up to 1.22 this morning. She getting a little more SOB. No chest pain. On Lovenox. Echo showed EF 60-65%, technically difficult study 2/2 poor windows, poor sound wave transmission, and body habitus, RWMA could not be excluded, GR1DD, calcified mitral annulus, mild MR, left atrium was  Mildly dilated, RV systolic function normal, PASP 40 mm Hg.   Review of Systems: Review of Systems  Constitutional: Positive for malaise/fatigue. Negative for fever, chills, weight loss and diaphoresis.  HENT: Negative for congestion.   Eyes: Negative for discharge and redness.  Respiratory: Positive for shortness of breath. Negative for cough, hemoptysis, sputum production and wheezing.   Cardiovascular: Negative for chest pain, palpitations, orthopnea, claudication, leg swelling and PND.  Gastrointestinal: Negative for nausea, vomiting and abdominal pain.  Musculoskeletal: Positive for joint pain and falls. Negative for myalgias.  Skin: Negative for rash.  Neurological: Positive for weakness. Negative for dizziness, sensory change, speech change, focal weakness and loss of consciousness.  Endo/Heme/Allergies: Does not bruise/bleed easily.  Psychiatric/Behavioral: The patient is not nervous/anxious.     Objective: Telemetry: sinus tachycardiac, 1-teens with occasional PVC's Physical Exam: Blood pressure 165/82, pulse 100, temperature 98.7 F (37.1 C), temperature source Oral, resp. rate 20, weight 262 lb 3.2 oz (118.933 kg), SpO2 99 %. Body mass index is 47.94 kg/(m^2). General: Well developed, well nourished, in no acute distress. Head: Normocephalic, atraumatic, sclera non-icteric, no xanthomas, nares are without discharge. Neck: Negative for carotid bruits. JVP not elevated. Lungs: Clear bilaterally to auscultation without wheezes, rales, or rhonchi. Breathing is  unlabored. Heart: Tachycardic S1 S2 without murmurs, rubs, or gallops.  Abdomen: Obese, soft, non-tender, non-distended with normoactive bowel sounds. No rebound/guarding. Extremities: No clubbing or cyanosis. No edema. Left leg wrapped. Left shoulder with significant ecchymosis. Neuro: Alert and oriented X 3. Moves all extremities spontaneously. Psych:  Responds to questions appropriately with a normal affect.   Intake/Output Summary (Last 24 hours) at 07/30/15 1128 Last data filed at 07/30/15 0906  Gross per 24 hour  Intake   1225 ml  Output   2300 ml  Net  -1075 ml    Inpatient Medications:  . aspirin  81 mg Oral Daily  . citalopram  40 mg Oral Daily  . docusate sodium  100 mg Oral BID  . enoxaparin (LOVENOX) injection  1 mg/kg Subcutaneous Q12H  . fenofibrate  160 mg Oral Daily  . insulin aspart  0-20 Units Subcutaneous TID WC  . insulin aspart  0-5 Units Subcutaneous QHS  . levothyroxine  100 mcg Oral QAC breakfast  . linagliptin  5 mg Oral Daily  . simvastatin  40 mg Oral QHS  . sodium chloride flush  3 mL Intravenous Q12H   Infusions:  . sodium chloride 50 mL/hr at 07/30/15 0831    Labs:  Recent Labs  07/29/15 0558 07/30/15 0837  NA 141 139  K 4.0 4.1  CL 106 104  CO2 22 24  GLUCOSE 201* 241*  BUN 22* 20  CREATININE 1.08* 0.85  CALCIUM 8.5* 8.3*    Recent Labs  07/28/15 2104  AST 26  ALT 34  ALKPHOS 53  BILITOT 0.7  PROT 7.3  ALBUMIN 4.0    Recent Labs  07/28/15 2104 07/29/15 0558 07/30/15 0837  WBC 13.4* 10.7 11.0  NEUTROABS 11.7*  --   --   HGB 12.6 11.3* 10.2*  HCT 39.1  34.8* 31.5*  MCV 85.7 86.6 87.0  PLT 284 271 225    Recent Labs  07/28/15 2104  07/29/15 0758 07/29/15 1440 07/29/15 2132 07/30/15 0301 07/30/15 0837  CKTOTAL 226  --  369* 462*  --   --   --   TROPONINI 0.17*  < > 0.36* 0.72* 1.12* 1.33* 1.22*  < > = values in this interval not displayed. Invalid input(s): POCBNP No results for input(s): HGBA1C in the last  72 hours.   Weights: Filed Weights   07/29/15 0242  Weight: 262 lb 3.2 oz (118.933 kg)     Radiology/Studies:  Dg Chest 1 View  07/28/2015  CLINICAL DATA:  Unwitnessed fall with left shoulder and hip injury. EXAM: CHEST 1 VIEW COMPARISON:  03/16/2015 FINDINGS: The cardiomediastinal contours are unchanged with atherosclerosis of the aortic arch. The lungs are clear. Pulmonary vasculature is normal. No consolidation, pleural effusion, or pneumothorax. Left proximal humerus fracture, partially included. IMPRESSION: 1.  No acute pulmonary process. 2. Left proximal humerus fracture, partially included. Electronically Signed   By: Jeb Levering M.D.   On: 07/28/2015 22:02   Ct Shoulder Left Wo Contrast  07/28/2015  CLINICAL DATA:  Left humeral head and neck fracture. Fall with subsequent left shoulder pain. EXAM: CT OF THE LEFT SHOULDER WITHOUT CONTRAST TECHNIQUE: Multidetector CT imaging was performed according to the standard protocol. Multiplanar CT image reconstructions were also generated. COMPARISON:  Radiographs earlier this day. FINDINGS: Highly comminuted and displaced fracture of the humeral head and neck. There is a curvilinear fragment displaced posterior medially measuring approximately 5 cm, likely arising from the posterior head. This abuts the posterior superior aspect of the glenoid. This is displaced greater than 1 cm. Fracture involvement through the bicipital groove. There is displaced involvement of the greater tuberosity and nondisplaced fracture of the lesser tuberosity. Fracture involves the surgical and anatomic neck. No additional fracture of the glenoid, scapula or clavicle. There is an associated joint effusion and subcutaneous edema most significant laterally. Edema tracks in the axilla. No fracture of the included left ribs for acute intrathoracic process in the left hemithorax. IMPRESSION: Highly comminuted and displaced proximal humeral head and neck fracture with involvement  of the greater and lesser tuberosities and displaced fragment abutting the posterior superior glenoid. Electronically Signed   By: Jeb Levering M.D.   On: 07/28/2015 23:34   Dg Shoulder Left  07/28/2015  CLINICAL DATA:  Unwitnessed fall this morning, with LEFT shoulder and LEFT hip injury. History of hypertension and diabetes. EXAM: LEFT SHOULDER - 2+ VIEW COMPARISON:  None. FINDINGS: Acute comminuted LEFT humeral head and neck fracture with greater than 3 bony fragments, and impaction. Fragments are generally aligned. No dislocation though, there is mild widening of the subacromial joint space. No destructive bony lesions. Large body habitus. IMPRESSION: Acute nondisplaced LEFT humeral head and neck fracture. Widening subacromial joint space could represent effusion or ligamentous injury without frank dislocation. Electronically Signed   By: Elon Alas M.D.   On: 07/28/2015 22:08   Dg Hip Unilat With Pelvis 2-3 Views Left  07/28/2015  CLINICAL DATA:  Unwitnessed fall this morning, with LEFT shoulder and LEFT hip injury. History of hypertension and diabetes. EXAM: DG HIP (WITH OR WITHOUT PELVIS) 2-3V LEFT COMPARISON:  None. FINDINGS: Comminuted nondisplaced acute LEFT femur intertrochanteric fracture with minimal distraction of the bony fragments. Femoral head is well formed and located. No dislocation. No destructive bony lesions. Surgical clips at pubic symphysis. Phleboliths in the pelvis. IMPRESSION: Acute  LEFT intertrochanteric femur fracture with minimal bony distraction. No dislocation. Electronically Signed   By: Elon Alas M.D.   On: 07/28/2015 22:12     Assessment and Plan   1. Elevated troponin: -Patient suffered a mechanical fall and was down on the floor for 14+ hours with her fracture left hip and left shoulder. She had taken off her Life Alert button and could not reach to telephone -Troponin has continued to trend upwards to a level of 1.22 this morning. She has never had  any chest pain or SOB until this morning when she began to develop some mild SOB. Continue to cycle troponin until this level peaks and trends downward -Recommend further evaluation this weekend for possible PE given her down time, elevated troponin, body habitus, and fractured hip with VQ scan or CTA PE protocol. I Anita Small leave ordering this to IM as I half day today and cannot follow upon these results until 2/5 -If the above is negative she Anita Small need cardiac catheterization on Monday, 08/01/15 for evaluation of ischemia. If intervention is required this Anita Small need to be discussed with ortho regarding timing of her impending hip surgery  2. Left hip fracture/left proximal humerus fracture: -Surgery has been postponed at this time pending work up and treatment of the above -Possible left shoulder replacement at a later date per ortho  3. Sinus tachycardia: -Possible PE given her down time, elevated troponin, body habitus, and fracture? -Echo did not show a dilated RV, though this is not definitive  -Recommend further evaluation with VQ scan or CTA PE protocol today   4. HTN: -Continue current medications  Signed, Christell Faith, PA-C Pager: [phone] 07/30/2015, 11:28 AM  I have seen and examined this patient with Christell Faith.  Agree with above, note added to reflect my findings.  On exam, tachycardic, regular, no murmurs, lungs clear.  Significant bruising on upper extremity.  Had fall with femur fracture.  Troponin elevated.  Now complaining of SOB.  Would agree with checking for V/Q or CTPA for PE.  If negative, would recommend heart cath vs. Stress testing Monday to see if she does have evidence of ischemias as troponin continues to be elevated.  Delvonte Berenson M. Tomoya Ringwald MD 07/30/2015 12:21 PM

## 2015-07-30 NOTE — Progress Notes (Signed)
Dr. Ether Griffins notified of D dimer result. Telephone order for VQ scan early Monday morning.

## 2015-07-30 NOTE — Progress Notes (Signed)
[facility] NAME: Anita Small    MR#:  [id number]  DATE OF BIRTH:  28-Mar-1951  SUBJECTIVE:  Patient admitted 07/28/15 after sustaining a mechanical fall. She denied any chest pain preceding palpitations shortness of breath. She was found to have fracture of left femur as well as left proximal humerus fracture. Noted to have elevated troponin started on therapeutic Lovenox,  cardiology consult was obtained. Cardiologist felt that patient should not be taken to operating room until at least Monday, until troponin is subsiding. Troponin is 1.33 today  /REVIEW OF SYSTEMS:  CONSTITUTIONAL: No fever, fatigue or weakness.  EYES: No blurred or double vision.  EARS, NOSE, AND THROAT: No tinnitus or ear pain.  RESPIRATORY: No cough, shortness of breath, wheezing or hemoptysis.  CARDIOVASCULAR: No chest pain, orthopnea, edema.  GASTROINTESTINAL: No nausea, vomiting, diarrhea or abdominal pain.  GENITOURINARY: No dysuria, hematuria.  ENDOCRINE: No polyuria, nocturia,  HEMATOLOGY: No anemia, easy bruising or bleeding SKIN: No rash or lesion. MUSCULOSKELETAL: Pain in the left hip as well as left shoulder NEUROLOGIC: No tingling, numbness, weakness.  PSYCHIATRY: No anxiety or depression.   DRUG ALLERGIES:   Allergies  Allergen Reactions  . Codeine Phosphate     REACTION: unspecified  . Duloxetine     REACTION: unspecified  . Meperidine Hcl     REACTION: unspecified  . Venlafaxine     REACTION: unspecified  . Doxycycline Rash    VITALS:  Blood pressure 104/83, pulse 107, temperature 98.8 F (37.1 C), temperature source Oral, resp. rate 18, weight 118.933 kg (262 lb 3.2 oz), SpO2 92 %.  PHYSICAL EXAMINATION:  VITAL SIGNS: Filed Vitals:   07/30/15 0509 07/30/15 1140  BP: 165/82 104/83  Pulse: 100 107  Temp: 98.7 F (37.1 C) 98.8 F (37.1 C)  Resp: 110 18   GENERAL:65 y.o.female currently in no acute distress.  HEAD:  Normocephalic, atraumatic.  EYES: Pupils equal, round, reactive to light. Extraocular muscles intact. No scleral icterus.  MOUTH: Moist mucosal membrane. Dentition intact. No abscess noted.  EAR, NOSE, THROAT: Clear without exudates. No external lesions.  NECK: Supple. No thyromegaly. No nodules. No JVD.  PULMONARY: Clear to ascultation, without wheeze rails or rhonci. No use of accessory muscles, Good respiratory effort. good air entry bilaterally CHEST: Nontender to palpation.  CARDIOVASCULAR: S1 and S2. Regular rate and rhythm. No murmurs, rubs, or gallops. No edema. Pedal pulses 2+ bilaterally.  GASTROINTESTINAL: Soft, nontender, nondistended. No masses. Positive bowel sounds. No hepatosplenomegaly.  MUSCULOSKELETAL: No swelling, clubbing, or edema. Range of motion limited left sided extremities given fracture  NEUROLOGIC: Cranial nerves II through XII are intact. No gross focal neurological deficits. Sensation intact. Reflexes intact.  SKIN: Large area of ecchymosis left shoulder left hip No ulceration, lesions, rashes, or cyanosis. Skin warm and dry. Turgor intact.  PSYCHIATRIC: Mood, affect within normal limits. The patient is awake, alert and oriented x 3. Insight, judgment intact.      LABORATORY PANEL:   CBC  Recent Labs Lab 07/30/15 0837  WBC 11.0  HGB 10.2*  HCT 31.5*  PLT 225   ------------------------------------------------------------------------------------------------------------------  Chemistries   Recent Labs Lab 07/28/15 2104  07/30/15 0837  NA 141  < > 139  K 4.0  < > 4.1  CL 106  < > 104  CO2 22  < > 24  GLUCOSE 217*  < > 241*  BUN 25*  < > 20  CREATININE 1.00  < >  0.85  CALCIUM 8.8*  < > 8.3*  AST 26  --   --   ALT 34  --   --   ALKPHOS 53  --   --   BILITOT 0.7  --   --   < > = values in this interval not displayed. ------------------------------------------------------------------------------------------------------------------  Cardiac  Enzymes  Recent Labs Lab 07/30/15 0837  TROPONINI 1.22*   ------------------------------------------------------------------------------------------------------------------  RADIOLOGY:  Dg Chest 1 View  07/28/2015  CLINICAL DATA:  Unwitnessed fall with left shoulder and hip injury. EXAM: CHEST 1 VIEW COMPARISON:  03/16/2015 FINDINGS: The cardiomediastinal contours are unchanged with atherosclerosis of the aortic arch. The lungs are clear. Pulmonary vasculature is normal. No consolidation, pleural effusion, or pneumothorax. Left proximal humerus fracture, partially included. IMPRESSION: 1.  No acute pulmonary process. 2. Left proximal humerus fracture, partially included. Electronically Signed   By: Jeb Levering M.D.   On: 07/28/2015 22:02   Ct Shoulder Left Wo Contrast  07/28/2015  CLINICAL DATA:  Left humeral head and neck fracture. Fall with subsequent left shoulder pain. EXAM: CT OF THE LEFT SHOULDER WITHOUT CONTRAST TECHNIQUE: Multidetector CT imaging was performed according to the standard protocol. Multiplanar CT image reconstructions were also generated. COMPARISON:  Radiographs earlier this day. FINDINGS: Highly comminuted and displaced fracture of the humeral head and neck. There is a curvilinear fragment displaced posterior medially measuring approximately 5 cm, likely arising from the posterior head. This abuts the posterior superior aspect of the glenoid. This is displaced greater than 1 cm. Fracture involvement through the bicipital groove. There is displaced involvement of the greater tuberosity and nondisplaced fracture of the lesser tuberosity. Fracture involves the surgical and anatomic neck. No additional fracture of the glenoid, scapula or clavicle. There is an associated joint effusion and subcutaneous edema most significant laterally. Edema tracks in the axilla. No fracture of the included left ribs for acute intrathoracic process in the left hemithorax. IMPRESSION: Highly  comminuted and displaced proximal humeral head and neck fracture with involvement of the greater and lesser tuberosities and displaced fragment abutting the posterior superior glenoid. Electronically Signed   By: Jeb Levering M.D.   On: 07/28/2015 23:34   Dg Shoulder Left  07/28/2015  CLINICAL DATA:  Unwitnessed fall this morning, with LEFT shoulder and LEFT hip injury. History of hypertension and diabetes. EXAM: LEFT SHOULDER - 2+ VIEW COMPARISON:  None. FINDINGS: Acute comminuted LEFT humeral head and neck fracture with greater than 3 bony fragments, and impaction. Fragments are generally aligned. No dislocation though, there is mild widening of the subacromial joint space. No destructive bony lesions. Large body habitus. IMPRESSION: Acute nondisplaced LEFT humeral head and neck fracture. Widening subacromial joint space could represent effusion or ligamentous injury without frank dislocation. Electronically Signed   By: Elon Alas M.D.   On: 07/28/2015 22:08   Dg Hip Unilat With Pelvis 2-3 Views Left  07/28/2015  CLINICAL DATA:  Unwitnessed fall this morning, with LEFT shoulder and LEFT hip injury. History of hypertension and diabetes. EXAM: DG HIP (WITH OR WITHOUT PELVIS) 2-3V LEFT COMPARISON:  None. FINDINGS: Comminuted nondisplaced acute LEFT femur intertrochanteric fracture with minimal distraction of the bony fragments. Femoral head is well formed and located. No dislocation. No destructive bony lesions. Surgical clips at pubic symphysis. Phleboliths in the pelvis. IMPRESSION: Acute LEFT intertrochanteric femur fracture with minimal bony distraction. No dislocation. Electronically Signed   By: Elon Alas M.D.   On: 07/28/2015 22:12    EKG:   Orders placed  or performed during the hospital encounter of 07/28/15  . EKG 12-Lead  . EKG 12-Lead  . EKG 12-Lead  . EKG 12-Lead  . EKG 12-Lead  . EKG 12-Lead    ASSESSMENT AND PLAN:  65 year old Caucasian female history of  non-insulin requiring type 2 diabetes essential hypertension hyperlipidemia unspecified presenting with mechanical fall admitted2/2/17 for left femur intertrochanteric fracture and left humerus nondisplaced fracture. With elevated troponin  1. Elevated troponin: Continued upward trend of enzymes, up to 1.33 early in the morning today, then down to 1.22, patient is being continued on therapeutic Lovenox , cardiology following input appreciated, echocardiogram is unremarkable. Cardiologist recommends to continue conservative therapy for the next 1-2 days and consider cardiac catheterization on Monday, surgery when cardiologist feels it is okay to undergo surgical procedure. Getting d-dimer   2. Preoperative evaluation: Would avoid surgery until cleared from cardiac standpoint  3. Type 2 diabetes non-insulin-requiring: Hold oral agents place on Accu-Chek with sliding scale coverage, blood glucose is ranging between 160-260   4. Left femur intertrochanteric fracture/ Left humerus fracture: Orthopedic surgery Dr. Rudene Christians involved in care will perform surgery when able   5. Unspecified Hyperlipidemia: Statin therapy  6. Venous thrombi embolism prophylactic: Currently therapeutic Lovenox in setting of #1, getting d-dimer and CT scan of the chest. The d-dimer is elevated. Pulmonary arterial pressure is elevated at 40.      All the records are reviewed and case discussed with Care Management/Social Workerr. Management plans discussed with the patient, family and they are in agreement.  CODE STATUS: Full  TOTAL TIME TAKING CARE OF THIS PATIENT: 50 minutes.  Discussed with cardiology and surgery for approximately 15 minutes  POSSIBLE D/C IN 3-4 DAYS, DEPENDING ON CLINICAL CONDITION.   Theodoro Grist M.D on 07/30/2015 at 12:55 PM  Between 7am to 6pm - Pager - [phone]  After 6pm: House Pager: - [phone]  [facility]  Office  [phone]  CC: Primary care physician;  Lenard Simmer, MD

## 2015-07-30 NOTE — Progress Notes (Signed)
MD notified of pt c/o not feeling right and not able to catch breath. VS stable at this time. MD placing order for ct of the chest

## 2015-07-31 LAB — GLUCOSE, CAPILLARY
GLUCOSE-CAPILLARY: 221 mg/dL — AB (ref 65–99)
GLUCOSE-CAPILLARY: 147 mg/dL — AB (ref 65–99)
Glucose-Capillary: 192 mg/dL — ABNORMAL HIGH (ref 65–99)
Glucose-Capillary: 222 mg/dL — ABNORMAL HIGH (ref 65–99)

## 2015-07-31 LAB — CBC
HEMATOCRIT: 28.6 % — AB (ref 35.0–47.0)
HEMOGLOBIN: 9.3 g/dL — AB (ref 12.0–16.0)
MCH: 28.5 pg (ref 26.0–34.0)
MCHC: 32.4 g/dL (ref 32.0–36.0)
MCV: 88.1 fL (ref 80.0–100.0)
Platelets: 214 10*3/uL (ref 150–440)
RBC: 3.24 MIL/uL — ABNORMAL LOW (ref 3.80–5.20)
RDW: 15.5 % — ABNORMAL HIGH (ref 11.5–14.5)
WBC: 9.3 10*3/uL (ref 3.6–11.0)

## 2015-07-31 LAB — EXPECTORATED SPUTUM ASSESSMENT W GRAM STAIN, RFLX TO RESP C

## 2015-07-31 LAB — EXPECTORATED SPUTUM ASSESSMENT W REFEX TO RESP CULTURE: SPECIAL REQUESTS: NORMAL

## 2015-07-31 LAB — TROPONIN I: Troponin I: 1.83 ng/mL — ABNORMAL HIGH (ref <0.031)

## 2015-07-31 MED ORDER — SODIUM CHLORIDE 0.9 % WEIGHT BASED INFUSION
3.0000 mL/kg/h | INTRAVENOUS | Status: DC
Start: 2015-08-01 — End: 2015-08-01
  Administered 2015-08-01: 3 mL/kg/h via INTRAVENOUS

## 2015-07-31 MED ORDER — VANCOMYCIN HCL 10 G IV SOLR
1250.0000 mg | Freq: Once | INTRAVENOUS | Status: AC
  Administered 2015-07-31: 1250 mg via INTRAVENOUS
  Filled 2015-07-31: qty 1250

## 2015-07-31 MED ORDER — PIPERACILLIN-TAZOBACTAM 4.5 G IVPB
4.5000 g | Freq: Three times a day (TID) | INTRAVENOUS | Status: AC
  Administered 2015-07-31 – 2015-08-03 (×10): 4.5 g via INTRAVENOUS
  Filled 2015-07-31 (×12): qty 100

## 2015-07-31 MED ORDER — VANCOMYCIN HCL 10 G IV SOLR
1250.0000 mg | Freq: Two times a day (BID) | INTRAVENOUS | Status: DC
  Administered 2015-07-31 – 2015-08-02 (×4): 1250 mg via INTRAVENOUS
  Filled 2015-07-31 (×6): qty 1250

## 2015-07-31 MED ORDER — SODIUM CHLORIDE 0.9 % WEIGHT BASED INFUSION
1.0000 mL/kg/h | INTRAVENOUS | Status: DC
Start: 2015-08-01 — End: 2015-08-01
  Administered 2015-08-01: 1 mL/kg/h via INTRAVENOUS

## 2015-07-31 MED ORDER — DEXTROSE 5 % IV SOLN
500.0000 mg | INTRAVENOUS | Status: DC
  Administered 2015-07-31 – 2015-08-04 (×4): 500 mg via INTRAVENOUS
  Filled 2015-07-31 (×7): qty 500

## 2015-07-31 MED ORDER — ASPIRIN 81 MG PO CHEW
81.0000 mg | CHEWABLE_TABLET | ORAL | Status: AC
  Administered 2015-08-01: 81 mg via ORAL
  Filled 2015-07-31: qty 1

## 2015-07-31 MED ORDER — SODIUM CHLORIDE 0.9 % IV SOLN: 250.0000 mL | INTRAVENOUS | Status: DC | PRN

## 2015-07-31 MED ORDER — GUAIFENESIN ER 600 MG PO TB12
600.0000 mg | ORAL_TABLET | Freq: Two times a day (BID) | ORAL | Status: DC
  Administered 2015-07-31 – 2015-08-08 (×14): 600 mg via ORAL
  Filled 2015-07-31 (×14): qty 1

## 2015-07-31 MED ORDER — SODIUM CHLORIDE 0.9% FLUSH: 3.0000 mL | INTRAVENOUS | Status: DC | PRN

## 2015-07-31 MED ORDER — SODIUM CHLORIDE 0.9% FLUSH
3.0000 mL | Freq: Two times a day (BID) | INTRAVENOUS | Status: DC
  Administered 2015-08-01: 3 mL via INTRAVENOUS

## 2015-07-31 NOTE — Progress Notes (Signed)
Willis MD returned page for notification of CT results neg for PE bu showed pt had right lobe pneumonia and pt not on antibiotics. MD to put in orders

## 2015-07-31 NOTE — Consult Note (Signed)
Orthopaedics-  Labs and notes reviewed. Patient is in reasonably good spirits and states that pain is controlled.  Probable cardiac catheterization as Monday. On antibiotics for the right pneumonia.  Any surgical intervention for the orthopaedic issues is on hold pending Medical and Cardiac clearance.  Giorgia Wahler P. Holley Bouche M.D.

## 2015-07-31 NOTE — Progress Notes (Signed)
   Given continued up trending troponin and negative CTA chest for PE will schedule for cardiac catheterization Monday morning with Dr. Fletcher Anon, MD. Continue to cycle troponin. Please hold Lovenox prior to cardiac cath.   Christell Faith, PA-C 07/31/2015 11:04 AM

## 2015-07-31 NOTE — Progress Notes (Signed)
Patient still enduring a great deal of pain this shift.  Medical and cardiac clearance still pending.  Plan for cardiac cath for tomorrow with possible intervention.

## 2015-07-31 NOTE — Progress Notes (Signed)
Pharmacy Antibiotic Note  Anita Small is a 65 y.o. female admitted on 07/28/2015 with pneumonia.  Pharmacy has been consulted for Zosyn and vancomycin dosing.  Plan: 1. Zosyn 4.5 gm IV Q8H EI (for BMI greater than 40) 2. Vancomycin 1.25 gm IV Q12H with stacked dosing, second dose approximately 7 hours after first. Vd 54.1 L, Ke 0.07 hr-1, T1/2 9.8 hr. Predicted trough 18 mcg/mL. Pharmacy will continue to follow and adjust as needed to maintain trough 15 to 20 mcg/mL.  Height: 5' 2.01" (157.5 cm) Weight: 262 lb 3.2 oz (118.933 kg) IBW/kg (Calculated) : 50.12  Temp (24hrs), Avg:98.6 F (37 C), Min:98 F (36.7 C), Max:99 F (37.2 C)   Recent Labs Lab 07/28/15 2104 07/28/15 2322 07/29/15 0558 07/30/15 0837  WBC 13.4*  --  10.7 11.0  CREATININE 1.00  --  1.08* 0.85  LATICACIDVEN 1.9 1.4  --   --     Estimated Creatinine Clearance: 79.8 mL/min (by C-G formula based on Cr of 0.85).    Allergies  Allergen Reactions  . Codeine Phosphate     REACTION: unspecified  . Duloxetine     REACTION: unspecified  . Meperidine Hcl     REACTION: unspecified  . Venlafaxine     REACTION: unspecified  . Doxycycline Rash     Thank you for allowing pharmacy to be a part of this patient's care.  Laural Benes, Pharm.D., BCPS Clinical Pharmacist 07/31/2015 12:02 AM

## 2015-07-31 NOTE — Progress Notes (Signed)
[facility] [place] NAME: Anita Small    MR#:  [id number]  DATE OF BIRTH:  1951-04-13  SUBJECTIVE:  Patient admitted 07/28/15 after sustaining a mechanical fall. She denied any chest pain preceding palpitations shortness of breath. She was found to have fracture of left femur as well as left proximal humerus fracture. Noted to have elevated troponin,  started on therapeutic Lovenox,  cardiology consult was obtained. Cardiologist felt that patient should not be taken to operating room until after cardiac cath, which is being planned for Monday. CTA of chest showed no pulmonary embolism but pneumonia, patient was initiated on broad-spectrum antibiotic therapy. She admits some cough with minimal sputum production, which is greenish in color and thick. Denies any fever or chills  /REVIEW OF SYSTEMS:  CONSTITUTIONAL: No fever, fatigue or weakness.  EYES: No blurred or double vision.  EARS, NOSE, AND THROAT: No tinnitus or ear pain.  RESPIRATORY: No cough, shortness of breath, wheezing or hemoptysis.  CARDIOVASCULAR: No chest pain, orthopnea, edema.  GASTROINTESTINAL: No nausea, vomiting, diarrhea or abdominal pain.  GENITOURINARY: No dysuria, hematuria.  ENDOCRINE: No polyuria, nocturia,  HEMATOLOGY: No anemia, easy bruising or bleeding SKIN: No rash or lesion. MUSCULOSKELETAL: Pain in the left hip as well as left shoulder NEUROLOGIC: No tingling, numbness, weakness.  PSYCHIATRY: No anxiety or depression.   DRUG ALLERGIES:   Allergies  Allergen Reactions  . Codeine Phosphate     REACTION: unspecified  . Duloxetine     REACTION: unspecified  . Meperidine Hcl     REACTION: unspecified  . Venlafaxine     REACTION: unspecified  . Doxycycline Rash    VITALS:  Blood pressure 107/51, pulse 97, temperature 99 F (37.2 C), temperature source Oral, resp. rate 18, height 5' 2.01" (1.575 m), weight 118.933 kg (262 lb 3.2 oz), SpO2 93  %.  PHYSICAL EXAMINATION:  VITAL SIGNS: Filed Vitals:   07/31/15 0759 07/31/15 1128  BP: 121/69 107/51  Pulse: 99 97  Temp: 98.7 F (37.1 C) 99 F (37.2 C)  Resp: 102 18   GENERAL:65 y.o.female currently in no acute distress.  HEAD: Normocephalic, atraumatic.  EYES: Pupils equal, round, reactive to light. Extraocular muscles intact. No scleral icterus.  MOUTH: Moist mucosal membrane. Dentition intact. No abscess noted.  EAR, NOSE, THROAT: Clear without exudates. No external lesions.  NECK: Supple. No thyromegaly. No nodules. No JVD.  PULMONARY: Clear to ascultation, without wheeze rails or rhonci. No use of accessory muscles, Good respiratory effort. good air entry bilaterally. Few rails and crepitations on auscultation scattered bilaterally posteriorly CHEST: Nontender to palpation.  CARDIOVASCULAR: S1 and S2. Regular rate and rhythm. No murmurs, rubs, or gallops. No edema. Pedal pulses 2+ bilaterally.  GASTROINTESTINAL: Soft, nontender, nondistended. No masses. Positive bowel sounds. No hepatosplenomegaly.  MUSCULOSKELETAL: No swelling, clubbing, or edema. Range of motion limited left sided extremities given fracture  NEUROLOGIC: Cranial nerves II through XII are intact. No gross focal neurological deficits. Sensation intact. Reflexes intact.  SKIN: Large area of ecchymosis left shoulder left hip No ulceration, lesions, rashes, or cyanosis. Skin warm and dry. Turgor intact. Swelling persist in the shoulder area PSYCHIATRIC: Mood, affect within normal limits. The patient is awake, alert and oriented x 3. Insight, judgment intact.      LABORATORY PANEL:   CBC  Recent Labs Lab 07/31/15 0404  WBC 9.3  HGB 9.3*  HCT 28.6*  PLT 214   ------------------------------------------------------------------------------------------------------------------  Chemistries   Recent Labs  Lab 07/28/15 2104  07/30/15 0837  NA 141  < > 139  K 4.0  < > 4.1  CL 106  < > 104  CO2 22  < >  24  GLUCOSE 217*  < > 241*  BUN 25*  < > 20  CREATININE 1.00  < > 0.85  CALCIUM 8.8*  < > 8.3*  AST 26  --   --   ALT 34  --   --   ALKPHOS 53  --   --   BILITOT 0.7  --   --   < > = values in this interval not displayed. ------------------------------------------------------------------------------------------------------------------  Cardiac Enzymes  Recent Labs Lab 07/31/15 0404  TROPONINI 1.83*   ------------------------------------------------------------------------------------------------------------------  RADIOLOGY:  Ct Angio Chest Pe W/cm &/or Wo Cm  07/30/2015  CLINICAL DATA:  Hypoxemia. Fall 2 days ago with left humerus fracture EXAM: CT ANGIOGRAPHY CHEST WITH CONTRAST TECHNIQUE: Multidetector CT imaging of the chest was performed using the standard protocol during bolus administration of intravenous contrast. Multiplanar CT image reconstructions and MIPs were obtained to evaluate the vascular anatomy. CONTRAST:  69mL OMNIPAQUE IOHEXOL 350 MG/ML SOLN COMPARISON:  Chest 07/28/2015 FINDINGS: Negative for pulmonary embolism. Pulmonary arteries normal in caliber. Atherosclerotic calcification in the aortic arch. No aortic aneurysm or dissection. Moderate coronary calcification. No pericardial effusion. Heart size normal. Extensive infiltrate in the right upper lobe, right lower lobe and right middle lobe consistent with pneumonia. Small area of infiltrate in the left lower lobe. Small right pleural effusion. Mild dependent atelectasis bilaterally. Negative for lung mass. Peritracheal lymph nodes are present measuring up to 13 mm in the right lower paratracheal node. No definite pathologic adenopathy. No acute abnormality in the upper abdomen. Negative for thoracic spine fracture. No acute skeletal abnormality. Review of the MIP images confirms the above findings. IMPRESSION: Negative for pulmonary embolism. Coronary calcification Extensive infiltrate throughout the right lung compatible  with pneumonia. Smaller infiltrate in the left lower lobe. Electronically Signed   By: Franchot Gallo M.D.   On: 07/30/2015 23:20    EKG:   Orders placed or performed during the hospital encounter of 07/28/15  . EKG 12-Lead  . EKG 12-Lead  . EKG 12-Lead  . EKG 12-Lead  . EKG 12-Lead  . EKG 12-Lead    ASSESSMENT AND PLAN:  65 year old Caucasian female history of non-insulin requiring type 2 diabetes essential hypertension hyperlipidemia unspecified presenting with mechanical fall admitted2/2/17 for left femur intertrochanteric fracture and left humerus nondisplaced fracture. With elevated troponin  1. Elevated troponin: Continued upward trend of enzymes, up to 1.83 today, patient is being continued on therapeutic Lovenox , cardiology following input appreciated, echocardiogram is unremarkable. Cardiologist recommends to proceed to cardiac catheterization on Monday . No PE.    2. Right lung pneumonia, getting PCR for MRSA, continue Zosyn as well as vancomycin and Zithromax for now, get sputum cultures if possible, discontinue vancomycin if MRSA PCR is negative. Adding Humibid as well as duo nebs  3. Type 2 diabetes non-insulin-requiring: Hold metformin due to recent CT angiogram , resume in the next 1-2 days , continue Accu-Chek with sliding scale coverage, blood glucose is ranging between 170-260   4. Left femur intertrochanteric fracture/ Left humerus fracture: Orthopedic surgery Dr. Rudene Christians involved in care will perform surgery when able   5. Unspecified Hyperlipidemia: Statin therapy  6. Venous thrombi embolism prophylactic: Currently therapeutic Lovenox in setting of #1, a pulmonary embolism on CTA. Pulmonary arterial pressure is elevated at 40.  All the records are reviewed and case discussed with Care Management/Social Workerr. Management plans discussed with the patient, family and they are in agreement.  CODE STATUS: Full  TOTAL TIME TAKING CARE OF THIS PATIENT: 40  minutes.  Discussed with surgery, Dr. Marry Guan POSSIBLE D/C IN 3-4 DAYS, DEPENDING ON CLINICAL CONDITION.   Theodoro Grist M.D on 07/31/2015 at 12:53 PM  Between 7am to 6pm - Pager - [phone]  After 6pm: House Pager: - [phone]  [facility]  Office  [phone]  CC: Primary care physician; Lenard Simmer, MD

## 2015-07-31 NOTE — Progress Notes (Signed)
MD notified of troponin of 1.83. No new orders received. Pt is currently on Lovenox

## 2015-08-01 ENCOUNTER — Encounter: Payer: Self-pay | Admitting: Cardiovascular Disease

## 2015-08-01 ENCOUNTER — Encounter
Admission: EM | Disposition: A | Discharge status: Skilled Nursing Facility | Payer: Self-pay | Source: Home / Self Care | Attending: Internal Medicine

## 2015-08-01 DIAGNOSIS — R000 Tachycardia, unspecified: Secondary | ICD-10-CM

## 2015-08-01 DIAGNOSIS — I214 Non-ST elevation (NSTEMI) myocardial infarction: Secondary | ICD-10-CM | POA: Insufficient documentation

## 2015-08-01 DIAGNOSIS — I2510 Atherosclerotic heart disease of native coronary artery without angina pectoris: Secondary | ICD-10-CM

## 2015-08-01 HISTORY — PX: CARDIAC CATHETERIZATION: SHX172

## 2015-08-01 LAB — CBC
HCT: 28.1 % — ABNORMAL LOW (ref 35.0–47.0)
Hemoglobin: 9.1 g/dL — ABNORMAL LOW (ref 12.0–16.0)
MCH: 27.8 pg (ref 26.0–34.0)
MCHC: 32.4 g/dL (ref 32.0–36.0)
MCV: 85.9 fL (ref 80.0–100.0)
PLATELETS: 234 10*3/uL (ref 150–440)
RBC: 3.27 MIL/uL — AB (ref 3.80–5.20)
RDW: 15.3 % — AB (ref 11.5–14.5)
WBC: 7.5 10*3/uL (ref 3.6–11.0)

## 2015-08-01 LAB — CREATININE, SERUM
CREATININE: 0.87 mg/dL (ref 0.44–1.00)
GFR calc non Af Amer: >60 mL/min (ref >60)

## 2015-08-01 LAB — GLUCOSE, CAPILLARY
GLUCOSE-CAPILLARY: 185 mg/dL — AB (ref 65–99)
Glucose-Capillary: 170 mg/dL — ABNORMAL HIGH (ref 65–99)
Glucose-Capillary: 202 mg/dL — ABNORMAL HIGH (ref 65–99)
Glucose-Capillary: 154 mg/dL — ABNORMAL HIGH (ref 65–99)

## 2015-08-01 LAB — MAGNESIUM: MAGNESIUM: 2 mg/dL (ref 1.7–2.4)

## 2015-08-01 LAB — TROPONIN I: TROPONIN I: 1.55 ng/mL — AB (ref <0.031)

## 2015-08-01 LAB — VANCOMYCIN, TROUGH: Vancomycin Tr: 7 ug/mL — ABNORMAL LOW (ref 10–20)

## 2015-08-01 SURGERY — LEFT HEART CATH AND CORONARY ANGIOGRAPHY
Anesthesia: Moderate Sedation

## 2015-08-01 MED ORDER — BIVALIRUDIN 250 MG IV SOLR
INTRAVENOUS | Status: AC
  Filled 2015-08-01: qty 250

## 2015-08-01 MED ORDER — BIVALIRUDIN BOLUS VIA INFUSION - CUPID
INTRAVENOUS | Status: DC | PRN
  Administered 2015-08-01: 89.175 mg via INTRAVENOUS

## 2015-08-01 MED ORDER — FENTANYL CITRATE (PF) 100 MCG/2ML IJ SOLN
INTRAMUSCULAR | Status: AC
  Filled 2015-08-01: qty 2

## 2015-08-01 MED ORDER — IOHEXOL 300 MG/ML  SOLN
INTRAMUSCULAR | Status: DC | PRN
  Administered 2015-08-01: 175 mL via INTRA_ARTERIAL

## 2015-08-01 MED ORDER — SODIUM CHLORIDE 0.9% FLUSH
3.0000 mL | Freq: Two times a day (BID) | INTRAVENOUS | Status: DC
  Administered 2015-08-02 – 2015-08-07 (×7): 3 mL via INTRAVENOUS

## 2015-08-01 MED ORDER — METOPROLOL TARTRATE 50 MG PO TABS
50.0000 mg | ORAL_TABLET | Freq: Two times a day (BID) | ORAL | Status: DC
  Administered 2015-08-01 – 2015-08-08 (×13): 50 mg via ORAL
  Filled 2015-08-01 (×13): qty 1

## 2015-08-01 MED ORDER — CLOPIDOGREL BISULFATE 75 MG PO TABS
ORAL_TABLET | ORAL | Status: DC | PRN
  Administered 2015-08-01: 600 mg via ORAL

## 2015-08-01 MED ORDER — SODIUM CHLORIDE 0.9 % IV SOLN
250.0000 mg | INTRAVENOUS | Status: DC | PRN
  Administered 2015-08-01: 1.75 mg/kg/h via INTRAVENOUS

## 2015-08-01 MED ORDER — SODIUM CHLORIDE 0.9 % IV SOLN: 250.0000 mL | INTRAVENOUS | Status: DC | PRN

## 2015-08-01 MED ORDER — ENOXAPARIN SODIUM 40 MG/0.4ML ~~LOC~~ SOLN
40.0000 mg | SUBCUTANEOUS | Status: DC
  Administered 2015-08-02: 40 mg via SUBCUTANEOUS
  Filled 2015-08-01 (×2): qty 0.4

## 2015-08-01 MED ORDER — ASPIRIN 81 MG PO CHEW
CHEWABLE_TABLET | ORAL | Status: DC | PRN
  Administered 2015-08-01: 324 mg via ORAL

## 2015-08-01 MED ORDER — METOPROLOL TARTRATE 25 MG PO TABS: 25.0000 mg | ORAL_TABLET | Freq: Two times a day (BID) | ORAL | Status: DC

## 2015-08-01 MED ORDER — CLOPIDOGREL BISULFATE 75 MG PO TABS
75.0000 mg | ORAL_TABLET | Freq: Every day | ORAL | Status: DC
  Administered 2015-08-02 – 2015-08-08 (×6): 75 mg via ORAL
  Filled 2015-08-01 (×6): qty 1

## 2015-08-01 MED ORDER — VERAPAMIL HCL 2.5 MG/ML IV SOLN
INTRAVENOUS | Status: AC
  Filled 2015-08-01: qty 2

## 2015-08-01 MED ORDER — VERAPAMIL HCL 2.5 MG/ML IV SOLN
INTRAVENOUS | Status: DC | PRN
  Administered 2015-08-01: 2.5 mg via INTRA_ARTERIAL

## 2015-08-01 MED ORDER — MIDAZOLAM HCL 2 MG/2ML IJ SOLN
INTRAMUSCULAR | Status: AC
  Filled 2015-08-01: qty 2

## 2015-08-01 MED ORDER — HEPARIN SODIUM (PORCINE) 1000 UNIT/ML IJ SOLN
INTRAMUSCULAR | Status: AC
  Filled 2015-08-01: qty 1

## 2015-08-01 MED ORDER — ASPIRIN 81 MG PO CHEW
CHEWABLE_TABLET | ORAL | Status: AC
  Filled 2015-08-01: qty 4

## 2015-08-01 MED ORDER — MIDAZOLAM HCL 2 MG/2ML IJ SOLN
INTRAMUSCULAR | Status: DC | PRN
  Administered 2015-08-01 (×2): 1 mg via INTRAVENOUS

## 2015-08-01 MED ORDER — CLOPIDOGREL BISULFATE 75 MG PO TABS
ORAL_TABLET | ORAL | Status: AC
  Filled 2015-08-01: qty 8

## 2015-08-01 MED ORDER — SODIUM CHLORIDE 0.9% FLUSH: 3.0000 mL | INTRAVENOUS | Status: DC | PRN

## 2015-08-01 MED ORDER — SODIUM CHLORIDE 0.9 % IV SOLN: INTRAVENOUS | Status: AC

## 2015-08-01 MED ORDER — ONDANSETRON HCL 4 MG/2ML IJ SOLN
INTRAMUSCULAR | Status: AC
  Filled 2015-08-01: qty 2

## 2015-08-01 MED ORDER — NITROGLYCERIN 5 MG/ML IV SOLN
INTRAVENOUS | Status: AC
  Filled 2015-08-01: qty 10

## 2015-08-01 MED ORDER — FENTANYL CITRATE (PF) 100 MCG/2ML IJ SOLN
INTRAMUSCULAR | Status: DC | PRN
  Administered 2015-08-01: 25 ug via INTRAVENOUS

## 2015-08-01 MED ORDER — HEPARIN SODIUM (PORCINE) 1000 UNIT/ML IJ SOLN
INTRAMUSCULAR | Status: DC | PRN
Start: 2015-08-01 — End: 2015-08-01
  Administered 2015-08-01: 4000 [IU] via INTRAVENOUS

## 2015-08-01 MED ORDER — ONDANSETRON HCL 4 MG/2ML IJ SOLN
INTRAMUSCULAR | Status: DC | PRN
  Administered 2015-08-01: 4 mg via INTRAVENOUS

## 2015-08-01 MED ORDER — HEPARIN (PORCINE) IN NACL 2-0.9 UNIT/ML-% IJ SOLN
INTRAMUSCULAR | Status: AC
  Filled 2015-08-01: qty 500

## 2015-08-01 MED ORDER — NITROGLYCERIN 1 MG/10 ML FOR IR/CATH LAB
INTRA_ARTERIAL | Status: DC | PRN
  Administered 2015-08-01: 200 ug via INTRACORONARY

## 2015-08-01 SURGICAL SUPPLY — 16 items
BALLN TREK RX 2.5X12 (BALLOONS) ×3
BALLN ~~LOC~~ TREK RX 3.0X12 (BALLOONS) ×3
BALLOON TREK RX 2.5X12 (BALLOONS) IMPLANT
BALLOON ~~LOC~~ TREK RX 3.0X12 (BALLOONS) IMPLANT
CATH INFINITI 5FR ANG PIGTAIL (CATHETERS) ×2 IMPLANT
CATH OPTITORQUE JACKY 4.0 5F (CATHETERS) ×3 IMPLANT
CATH VISTA GUIDE 6FR XB3 (CATHETERS) ×2 IMPLANT
DEVICE INFLAT 30 PLUS (MISCELLANEOUS) ×2 IMPLANT
DEVICE RAD TR BAND REGULAR (VASCULAR PRODUCTS) ×2 IMPLANT
GLIDESHEATH SLEND SS 6F .021 (SHEATH) ×3 IMPLANT
KIT MANI 3VAL PERCEP (MISCELLANEOUS) ×3 IMPLANT
PACK CARDIAC CATH (CUSTOM PROCEDURE TRAY) ×3 IMPLANT
STENT RESOLUTE INTEG 2.75X18 (Permanent Stent) ×2 IMPLANT
WIRE HITORQ VERSACORE ST 145CM (WIRE) ×2 IMPLANT
WIRE RUNTHROUGH .014X180CM (WIRE) ×2 IMPLANT
WIRE SAFE-T 1.5MM-J .035X260CM (WIRE) ×3 IMPLANT

## 2015-08-01 NOTE — Interval H&P Note (Signed)
History and Physical Interval Note:  08/01/2015 9:06 AM  Anita Small  has presented today for surgery, with the diagnosis of nstemi  The various methods of treatment have been discussed with the patient and family. After consideration of risks, benefits and other options for treatment, the patient has consented to  Procedure(s): Left Heart Cath and Coronary Angiography (N/A) as a surgical intervention .  The patient's history has been reviewed, patient examined, no change in status, stable for surgery.  I have reviewed the patient's chart and labs.  Questions were answered to the patient's satisfaction.     Kathlyn Sacramento

## 2015-08-01 NOTE — Progress Notes (Signed)
Patient: Anita Small / Admit Date: 07/28/2015 / Date of Encounter: 08/01/2015, 7:14 PM   Subjective:  Echo showed EF 60-65%, technically difficult study 2/2 poor windows, poor sound wave transmission, and body habitus, RWMA could not be excluded, GR1DD, calcified mitral annulus, mild MR, left atrium was  Mildly dilated, RV systolic function normal, PASP 40 mm Hg.  I proceeded with cardiac catheterization today which showed 99% distal left circumflex stenosis and 70% proximal to mid RCA stenosis but the vessel is small and nondominant. I proceeded with angioplasty and drug-eluting stent placement to the distal left circumflex.  Review of Systems: Review of Systems  Constitutional: Positive for malaise/fatigue. Negative for fever, chills, weight loss and diaphoresis.  HENT: Negative for congestion.   Eyes: Negative for discharge and redness.  Respiratory: Positive for shortness of breath. Negative for cough, hemoptysis, sputum production and wheezing.   Cardiovascular: Negative for chest pain, palpitations, orthopnea, claudication, leg swelling and PND.  Gastrointestinal: Negative for nausea, vomiting and abdominal pain.  Musculoskeletal: Positive for joint pain and falls. Negative for myalgias.  Skin: Negative for rash.  Neurological: Positive for weakness. Negative for dizziness, sensory change, speech change, focal weakness and loss of consciousness.  Endo/Heme/Allergies: Does not bruise/bleed easily.  Psychiatric/Behavioral: The patient is not nervous/anxious.     Objective: Telemetry: sinus tachycardiac, 1-teens with occasional PVC's Physical Exam: Blood pressure 110/91, pulse 99, temperature 98.4 F (36.9 C), temperature source Oral, resp. rate 18, height 5' 2.01" (1.575 m), weight 275 lb 1.6 oz (124.785 kg), SpO2 97 %. Body mass index is 50.3 kg/(m^2). General: Well developed, well nourished, in no acute distress. Head: Normocephalic, atraumatic, sclera non-icteric, no  xanthomas, nares are without discharge. Neck: Negative for carotid bruits. JVP not elevated. Lungs: Clear bilaterally to auscultation without wheezes, rales, or rhonchi. Breathing is unlabored. Heart: Tachycardic S1 S2 without murmurs, rubs, or gallops.  Abdomen: Obese, soft, non-tender, non-distended with normoactive bowel sounds. No rebound/guarding. Extremities: No clubbing or cyanosis. No edema. Left leg wrapped. Left shoulder with significant ecchymosis. Neuro: Alert and oriented X 3. Moves all extremities spontaneously. Psych:  Responds to questions appropriately with a normal affect.   Intake/Output Summary (Last 24 hours) at 08/01/15 1914 Last data filed at 08/01/15 1841  Gross per 24 hour  Intake      0 ml  Output   2425 ml  Net  -2425 ml    Inpatient Medications:  . aspirin  81 mg Oral Daily  . azithromycin  500 mg Intravenous Q24H  . citalopram  40 mg Oral Daily  . [START ON 08/02/2015] clopidogrel  75 mg Oral Q breakfast  . docusate sodium  100 mg Oral BID  . [START ON 08/02/2015] enoxaparin (LOVENOX) injection  40 mg Subcutaneous Q24H  . fenofibrate  160 mg Oral Daily  . guaiFENesin  600 mg Oral BID  . insulin aspart  0-20 Units Subcutaneous TID WC  . insulin aspart  0-5 Units Subcutaneous QHS  . levothyroxine  100 mcg Oral QAC breakfast  . linagliptin  5 mg Oral Daily  . metoprolol tartrate  50 mg Oral BID  . piperacillin-tazobactam (ZOSYN)  IV  4.5 g Intravenous 3 times per day  . simvastatin  40 mg Oral QHS  . sodium chloride flush  3 mL Intravenous Q12H  . sodium chloride flush  3 mL Intravenous Q12H  . vancomycin  1,250 mg Intravenous Q12H   Infusions:  . sodium chloride 50 mL/hr at 07/31/15 1742  .  sodium chloride Stopped (08/01/15 1504)    Labs:  Recent Labs  07/30/15 0837 08/01/15 0442  NA 139  --   K 4.1  --   CL 104  --   CO2 24  --   GLUCOSE 241*  --   BUN 20  --   CREATININE 0.85 0.87  CALCIUM 8.3*  --   MG  --  2.0   No results for  input(s): AST, ALT, ALKPHOS, BILITOT, PROT, ALBUMIN in the last 72 hours.  Recent Labs  07/31/15 0404 08/01/15 0442  WBC 9.3 7.5  HGB 9.3* 9.1*  HCT 28.6* 28.1*  MCV 88.1 85.9  PLT 214 234    Recent Labs  07/30/15 0301 07/30/15 0837 07/31/15 0404 08/01/15 0442  TROPONINI 1.33* 1.22* 1.83* 1.55*   Invalid input(s): POCBNP No results for input(s): HGBA1C in the last 72 hours.   Weights: Filed Weights   07/29/15 0242 08/01/15 1402  Weight: 262 lb 3.2 oz (118.933 kg) 275 lb 1.6 oz (124.785 kg)     Radiology/Studies:  Dg Chest 1 View  07/28/2015  CLINICAL DATA:  Unwitnessed fall with left shoulder and hip injury. EXAM: CHEST 1 VIEW COMPARISON:  03/16/2015 FINDINGS: The cardiomediastinal contours are unchanged with atherosclerosis of the aortic arch. The lungs are clear. Pulmonary vasculature is normal. No consolidation, pleural effusion, or pneumothorax. Left proximal humerus fracture, partially included. IMPRESSION: 1.  No acute pulmonary process. 2. Left proximal humerus fracture, partially included. Electronically Signed   By: Jeb Levering M.D.   On: 07/28/2015 22:02   Ct Angio Chest Pe W/cm &/or Wo Cm  07/30/2015  CLINICAL DATA:  Hypoxemia. Fall 2 days ago with left humerus fracture EXAM: CT ANGIOGRAPHY CHEST WITH CONTRAST TECHNIQUE: Multidetector CT imaging of the chest was performed using the standard protocol during bolus administration of intravenous contrast. Multiplanar CT image reconstructions and MIPs were obtained to evaluate the vascular anatomy. CONTRAST:  48mL OMNIPAQUE IOHEXOL 350 MG/ML SOLN COMPARISON:  Chest 07/28/2015 FINDINGS: Negative for pulmonary embolism. Pulmonary arteries normal in caliber. Atherosclerotic calcification in the aortic arch. No aortic aneurysm or dissection. Moderate coronary calcification. No pericardial effusion. Heart size normal. Extensive infiltrate in the right upper lobe, right lower lobe and right middle lobe consistent with  pneumonia. Small area of infiltrate in the left lower lobe. Small right pleural effusion. Mild dependent atelectasis bilaterally. Negative for lung mass. Peritracheal lymph nodes are present measuring up to 13 mm in the right lower paratracheal node. No definite pathologic adenopathy. No acute abnormality in the upper abdomen. Negative for thoracic spine fracture. No acute skeletal abnormality. Review of the MIP images confirms the above findings. IMPRESSION: Negative for pulmonary embolism. Coronary calcification Extensive infiltrate throughout the right lung compatible with pneumonia. Smaller infiltrate in the left lower lobe. Electronically Signed   By: Franchot Gallo M.D.   On: 07/30/2015 23:20   Ct Shoulder Left Wo Contrast  07/28/2015  CLINICAL DATA:  Left humeral head and neck fracture. Fall with subsequent left shoulder pain. EXAM: CT OF THE LEFT SHOULDER WITHOUT CONTRAST TECHNIQUE: Multidetector CT imaging was performed according to the standard protocol. Multiplanar CT image reconstructions were also generated. COMPARISON:  Radiographs earlier this day. FINDINGS: Highly comminuted and displaced fracture of the humeral head and neck. There is a curvilinear fragment displaced posterior medially measuring approximately 5 cm, likely arising from the posterior head. This abuts the posterior superior aspect of the glenoid. This is displaced greater than 1 cm. Fracture involvement through the bicipital  groove. There is displaced involvement of the greater tuberosity and nondisplaced fracture of the lesser tuberosity. Fracture involves the surgical and anatomic neck. No additional fracture of the glenoid, scapula or clavicle. There is an associated joint effusion and subcutaneous edema most significant laterally. Edema tracks in the axilla. No fracture of the included left ribs for acute intrathoracic process in the left hemithorax. IMPRESSION: Highly comminuted and displaced proximal humeral head and neck  fracture with involvement of the greater and lesser tuberosities and displaced fragment abutting the posterior superior glenoid. Electronically Signed   By: Jeb Levering M.D.   On: 07/28/2015 23:34   Dg Shoulder Left  07/28/2015  CLINICAL DATA:  Unwitnessed fall this morning, with LEFT shoulder and LEFT hip injury. History of hypertension and diabetes. EXAM: LEFT SHOULDER - 2+ VIEW COMPARISON:  None. FINDINGS: Acute comminuted LEFT humeral head and neck fracture with greater than 3 bony fragments, and impaction. Fragments are generally aligned. No dislocation though, there is mild widening of the subacromial joint space. No destructive bony lesions. Large body habitus. IMPRESSION: Acute nondisplaced LEFT humeral head and neck fracture. Widening subacromial joint space could represent effusion or ligamentous injury without frank dislocation. Electronically Signed   By: Elon Alas M.D.   On: 07/28/2015 22:08   Dg Hip Unilat With Pelvis 2-3 Views Left  07/28/2015  CLINICAL DATA:  Unwitnessed fall this morning, with LEFT shoulder and LEFT hip injury. History of hypertension and diabetes. EXAM: DG HIP (WITH OR WITHOUT PELVIS) 2-3V LEFT COMPARISON:  None. FINDINGS: Comminuted nondisplaced acute LEFT femur intertrochanteric fracture with minimal distraction of the bony fragments. Femoral head is well formed and located. No dislocation. No destructive bony lesions. Surgical clips at pubic symphysis. Phleboliths in the pelvis. IMPRESSION: Acute LEFT intertrochanteric femur fracture with minimal bony distraction. No dislocation. Electronically Signed   By: Elon Alas M.D.   On: 07/28/2015 22:12     Assessment and Plan   1. Non-ST elevation myocardial infarction: Cardiac catheterization showed severe one-vessel coronary artery disease status post angioplasty and drug-eluting stent placement to the distal left circumflex. Continue dual antiplatelet therapy. I increased the dose of metoprolol to 50 mg  twice daily. Continue treatment with a statin.   2. Left hip fracture/left proximal humerus fracture: -Surgery was postponed due to myocardial infarction. I discussed the case with Dr. Rudene Christians about the timing of hip repair surgery. Ideally, the surgery should be postponed one month after stent placement. However, this is probably not a viable option as the patient will become bedridden if we choose this. Dr. Rudene Christians feels that the surgery can be done while the patient is on dual antiplatelet therapy without interruption. I think the best option is to proceed with surgery later this week with the understanding that she will be at least moderate risk for cardiovascular complications. I discussed this extensively with the patient and she understands the risk and she wants to proceed with surgery as she feels that she has no quality of life at the present time without repair of her hip fracture.   3. Sinus tachycardia: - I increased the dose of metoprolol to 50 mg twice daily  4. HTN: -Continue current medications  Signed,  Kathlyn Sacramento, MD

## 2015-08-01 NOTE — Progress Notes (Signed)
Plan ORIF left hip when ok with cardiology.

## 2015-08-01 NOTE — Progress Notes (Signed)
1203 - 3 cc of air removed from TR band

## 2015-08-01 NOTE — Progress Notes (Signed)
Accepted in transfer from spec's recovery post cardiac cath and stent placement. Rt radial access. Rt hand warm with good sensation. Foley draining. Pt is a/o. Somewhat uncomfortable from left sided fx's. Repositioned. Lunch taken well.  Tele nsr.

## 2015-08-01 NOTE — Progress Notes (Signed)
[facility] NAME: Anita Small    MR#:  [id number]  DATE OF BIRTH:  1950-07-25  SUBJECTIVE:  Patient admitted 07/28/15 after sustaining a mechanical fall. She denied any chest pain preceding palpitations shortness of breath. She was found to have fracture of left femur as well as left proximal humerus fracture. Noted to have elevated troponin,  non-Q-wave MI,  started on therapeutic Lovenox,  cardiology consult was obtained. Cardiologist proceded to cardiac cath today. CTA of chest showed no pulmonary embolism but pneumonia, patient was initiated on broad-spectrum antibiotic therapy. She admits some cough with minimal sputum production, which is greenish in color and thick. Denies any fever or chills. Use of alcohol. Comfortable today. Denies any significant pain, although admits of shortness of breath on exertion, her O2 sats are good and she is down to 2-1/2 L of oxygen through nasal cannula which is her baseline  /REVIEW OF SYSTEMS:  CONSTITUTIONAL: No fever, fatigue or weakness.  EYES: No blurred or double vision.  EARS, NOSE, AND THROAT: No tinnitus or ear pain.  RESPIRATORY: No cough, shortness of breath, wheezing or hemoptysis.  CARDIOVASCULAR: No chest pain, orthopnea, edema.  GASTROINTESTINAL: No nausea, vomiting, diarrhea or abdominal pain.  GENITOURINARY: No dysuria, hematuria.  ENDOCRINE: No polyuria, nocturia,  HEMATOLOGY: No anemia, easy bruising or bleeding SKIN: No rash or lesion. MUSCULOSKELETAL: Pain in the left hip as well as left shoulder NEUROLOGIC: No tingling, numbness, weakness.  PSYCHIATRY: No anxiety or depression.   DRUG ALLERGIES:   Allergies  Allergen Reactions  . Codeine Phosphate     REACTION: unspecified  . Duloxetine     REACTION: unspecified  . Meperidine Hcl     REACTION: unspecified  . Venlafaxine     REACTION: unspecified  . Doxycycline Rash    VITALS:  Blood pressure 127/61, pulse  97, temperature 98.5 F (36.9 C), temperature source Oral, resp. rate 18, height 5' 2.01" (1.575 m), weight 118.933 kg (262 lb 3.2 oz), SpO2 96 %.  PHYSICAL EXAMINATION:  VITAL SIGNS: Filed Vitals:   08/01/15 1107 08/01/15 1200  BP: 120/60 127/61  Pulse: 96 97  Temp:    Resp: 43 18   GENERAL:65 y.o.female currently in no acute distress.  HEAD: Normocephalic, atraumatic.  EYES: Pupils equal, round, reactive to light. Extraocular muscles intact. No scleral icterus.  MOUTH: Moist mucosal membrane. Dentition intact. No abscess noted.  EAR, NOSE, THROAT: Clear without exudates. No external lesions.  NECK: Supple. No thyromegaly. No nodules. No JVD.  PULMONARY: Diminished to ascultation, especially at bases due to poor respiratory effort, few scattered wheezes,  rales , and intermittent rhonci. No use of accessory muscles, Good respiratory effort. good air entry bilaterally.  CHEST: Nontender to palpation.  CARDIOVASCULAR: S1 and S2. Regular rate and rhythm. No murmurs, rubs, or gallops. No edema. Pedal pulses 2+ bilaterally.  GASTROINTESTINAL: Soft, nontender, nondistended. No masses. Positive bowel sounds. No hepatosplenomegaly.  MUSCULOSKELETAL: No swelling, clubbing, or edema. Range of motion limited left sided extremities given fracture  NEUROLOGIC: Cranial nerves II through XII are intact. No gross focal neurological deficits. Sensation intact. Reflexes intact.  SKIN: Large area of ecchymosis left shoulder left hip No ulceration, lesions, rashes, or cyanosis. Skin warm and dry. Turgor intact. Swelling persist in the shoulder area PSYCHIATRIC: Mood, affect within normal limits. The patient is awake, alert and oriented x 3. Insight, judgment intact.      LABORATORY PANEL:   CBC  Recent Labs  Lab 08/01/15 0442  WBC 7.5  HGB 9.1*  HCT 28.1*  PLT 234   ------------------------------------------------------------------------------------------------------------------  Chemistries    Recent Labs Lab 07/28/15 2104  07/30/15 0837 08/01/15 0442  NA 141  < > 139  --   K 4.0  < > 4.1  --   CL 106  < > 104  --   CO2 22  < > 24  --   GLUCOSE 217*  < > 241*  --   BUN 25*  < > 20  --   CREATININE 1.00  < > 0.85 0.87  CALCIUM 8.8*  < > 8.3*  --   MG  --   --   --  2.0  AST 26  --   --   --   ALT 34  --   --   --   ALKPHOS 53  --   --   --   BILITOT 0.7  --   --   --   < > = values in this interval not displayed. ------------------------------------------------------------------------------------------------------------------  Cardiac Enzymes  Recent Labs Lab 08/01/15 0442  TROPONINI 1.55*   ------------------------------------------------------------------------------------------------------------------  RADIOLOGY:  Ct Angio Chest Pe W/cm &/or Wo Cm  07/30/2015  CLINICAL DATA:  Hypoxemia. Fall 2 days ago with left humerus fracture EXAM: CT ANGIOGRAPHY CHEST WITH CONTRAST TECHNIQUE: Multidetector CT imaging of the chest was performed using the standard protocol during bolus administration of intravenous contrast. Multiplanar CT image reconstructions and MIPs were obtained to evaluate the vascular anatomy. CONTRAST:  21mL OMNIPAQUE IOHEXOL 350 MG/ML SOLN COMPARISON:  Chest 07/28/2015 FINDINGS: Negative for pulmonary embolism. Pulmonary arteries normal in caliber. Atherosclerotic calcification in the aortic arch. No aortic aneurysm or dissection. Moderate coronary calcification. No pericardial effusion. Heart size normal. Extensive infiltrate in the right upper lobe, right lower lobe and right middle lobe consistent with pneumonia. Small area of infiltrate in the left lower lobe. Small right pleural effusion. Mild dependent atelectasis bilaterally. Negative for lung mass. Peritracheal lymph nodes are present measuring up to 13 mm in the right lower paratracheal node. No definite pathologic adenopathy. No acute abnormality in the upper abdomen. Negative for thoracic spine  fracture. No acute skeletal abnormality. Review of the MIP images confirms the above findings. IMPRESSION: Negative for pulmonary embolism. Coronary calcification Extensive infiltrate throughout the right lung compatible with pneumonia. Smaller infiltrate in the left lower lobe. Electronically Signed   By: Franchot Gallo M.D.   On: 07/30/2015 23:20    EKG:   Orders placed or performed during the hospital encounter of 07/28/15  . EKG 12-Lead  . EKG 12-Lead  . EKG 12-Lead  . EKG 12-Lead  . EKG 12-Lead  . EKG 12-Lead  . EKG 12-Lead immediately post procedure  . EKG 12-Lead  . EKG 12-Lead immediately post procedure    ASSESSMENT AND PLAN:  65 year old Caucasian female history of non-insulin requiring type 2 diabetes essential hypertension hyperlipidemia unspecified presenting with mechanical fall admitted2/2/17 for left femur intertrochanteric fracture and left humerus nondisplaced fracture. With elevated troponin  1. 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 83 yesterday, declining now, patient is being continued on therapeutic Lovenox , cardiology input appreciated, echocardiogram was unremarkable. Patient will be undergone cardiac catheterization today, continue aspirin, metoprolol and Zocor.  No PE.    2. Right lung pneumonia, PCR for MRSA was negative, continue Zosyn as well as Zithromax for now, sputum cultures are pending,  off vancomycin as MRSA PCR  was negative. Continue Humibid as well as duo nebs, improved clinically  3. Type 2 diabetes non-insulin-requiring: Holding metformin due to recent CT angiogram , resume in the next 1 day , continue Accu-Chek with sliding scale coverage, blood glucose is ranging between 150-220  4. Left femur intertrochanteric fracture/ Left humerus fracture: Orthopedic surgery Dr. Rudene Christians involved in care will perform surgery on left femur intertrochanteric fracture tomorrow   5. Unspecified Hyperlipidemia: Statin,  fenofibrate therapy  6. Venous thrombi embolism prophylactic: Currently therapeutic Lovenox in setting of #1, a pulmonary embolism on CTA. Pulmonary arterial pressure is elevated at 40.   7. Cardiac arrhythmias, unspecified, magnesium level was normal, initiate patient on metoprolol for myocardium stabilization   All the records are reviewed and case discussed with Care Management/Social Workerr. Management plans discussed with the patient, family and they are in agreement.  CODE STATUS: Full  TOTAL TIME TAKING CARE OF THIS PATIENT: 40 minutes.  Discussed with nursing staff in regards to cardiac care POSSIBLE D/C IN 3-4 DAYS, DEPENDING ON CLINICAL CONDITION.   Theodoro Grist M.D on 08/01/2015 at 12:05 PM  Between 7am to 6pm - Pager - [phone]  After 6pm: House Pager: - [phone]  [facility]  Office  [phone]  CC: Primary care physician; Lenard Simmer, MD

## 2015-08-01 NOTE — H&P (View-Only) (Signed)
Patient: Anita Small / Admit Date: 07/28/2015 / Date of Encounter: 07/30/2015, 11:28 AM   Subjective: Troponin has trended up to 1.22 this morning. She getting a little more SOB. No chest pain. On Lovenox. Echo showed EF 60-65%, technically difficult study 2/2 poor windows, poor sound wave transmission, and body habitus, RWMA could not be excluded, GR1DD, calcified mitral annulus, mild MR, left atrium was  Mildly dilated, RV systolic function normal, PASP 40 mm Hg.   Review of Systems: Review of Systems  Constitutional: Positive for malaise/fatigue. Negative for fever, chills, weight loss and diaphoresis.  HENT: Negative for congestion.   Eyes: Negative for discharge and redness.  Respiratory: Positive for shortness of breath. Negative for cough, hemoptysis, sputum production and wheezing.   Cardiovascular: Negative for chest pain, palpitations, orthopnea, claudication, leg swelling and PND.  Gastrointestinal: Negative for nausea, vomiting and abdominal pain.  Musculoskeletal: Positive for joint pain and falls. Negative for myalgias.  Skin: Negative for rash.  Neurological: Positive for weakness. Negative for dizziness, sensory change, speech change, focal weakness and loss of consciousness.  Endo/Heme/Allergies: Does not bruise/bleed easily.  Psychiatric/Behavioral: The patient is not nervous/anxious.     Objective: Telemetry: sinus tachycardiac, 1-teens with occasional PVC's Physical Exam: Blood pressure 165/82, pulse 100, temperature 98.7 F (37.1 C), temperature source Oral, resp. rate 20, weight 262 lb 3.2 oz (118.933 kg), SpO2 99 %. Body mass index is 47.94 kg/(m^2). General: Well developed, well nourished, in no acute distress. Head: Normocephalic, atraumatic, sclera non-icteric, no xanthomas, nares are without discharge. Neck: Negative for carotid bruits. JVP not elevated. Lungs: Clear bilaterally to auscultation without wheezes, rales, or rhonchi. Breathing is  unlabored. Heart: Tachycardic S1 S2 without murmurs, rubs, or gallops.  Abdomen: Obese, soft, non-tender, non-distended with normoactive bowel sounds. No rebound/guarding. Extremities: No clubbing or cyanosis. No edema. Left leg wrapped. Left shoulder with significant ecchymosis. Neuro: Alert and oriented X 3. Moves all extremities spontaneously. Psych:  Responds to questions appropriately with a normal affect.   Intake/Output Summary (Last 24 hours) at 07/30/15 1128 Last data filed at 07/30/15 0906  Gross per 24 hour  Intake   1225 ml  Output   2300 ml  Net  -1075 ml    Inpatient Medications:  . aspirin  81 mg Oral Daily  . citalopram  40 mg Oral Daily  . docusate sodium  100 mg Oral BID  . enoxaparin (LOVENOX) injection  1 mg/kg Subcutaneous Q12H  . fenofibrate  160 mg Oral Daily  . insulin aspart  0-20 Units Subcutaneous TID WC  . insulin aspart  0-5 Units Subcutaneous QHS  . levothyroxine  100 mcg Oral QAC breakfast  . linagliptin  5 mg Oral Daily  . simvastatin  40 mg Oral QHS  . sodium chloride flush  3 mL Intravenous Q12H   Infusions:  . sodium chloride 50 mL/hr at 07/30/15 0831    Labs:  Recent Labs  07/29/15 0558 07/30/15 0837  NA 141 139  K 4.0 4.1  CL 106 104  CO2 22 24  GLUCOSE 201* 241*  BUN 22* 20  CREATININE 1.08* 0.85  CALCIUM 8.5* 8.3*    Recent Labs  07/28/15 2104  AST 26  ALT 34  ALKPHOS 53  BILITOT 0.7  PROT 7.3  ALBUMIN 4.0    Recent Labs  07/28/15 2104 07/29/15 0558 07/30/15 0837  WBC 13.4* 10.7 11.0  NEUTROABS 11.7*  --   --   HGB 12.6 11.3* 10.2*  HCT 39.1  34.8* 31.5*  MCV 85.7 86.6 87.0  PLT 284 271 225    Recent Labs  07/28/15 2104  07/29/15 0758 07/29/15 1440 07/29/15 2132 07/30/15 0301 07/30/15 0837  CKTOTAL 226  --  369* 462*  --   --   --   TROPONINI 0.17*  < > 0.36* 0.72* 1.12* 1.33* 1.22*  < > = values in this interval not displayed. Invalid input(s): POCBNP No results for input(s): HGBA1C in the last  72 hours.   Weights: Filed Weights   07/29/15 0242  Weight: 262 lb 3.2 oz (118.933 kg)     Radiology/Studies:  Dg Chest 1 View  07/28/2015  CLINICAL DATA:  Unwitnessed fall with left shoulder and hip injury. EXAM: CHEST 1 VIEW COMPARISON:  03/16/2015 FINDINGS: The cardiomediastinal contours are unchanged with atherosclerosis of the aortic arch. The lungs are clear. Pulmonary vasculature is normal. No consolidation, pleural effusion, or pneumothorax. Left proximal humerus fracture, partially included. IMPRESSION: 1.  No acute pulmonary process. 2. Left proximal humerus fracture, partially included. Electronically Signed   By: Jeb Levering M.D.   On: 07/28/2015 22:02   Ct Shoulder Left Wo Contrast  07/28/2015  CLINICAL DATA:  Left humeral head and neck fracture. Fall with subsequent left shoulder pain. EXAM: CT OF THE LEFT SHOULDER WITHOUT CONTRAST TECHNIQUE: Multidetector CT imaging was performed according to the standard protocol. Multiplanar CT image reconstructions were also generated. COMPARISON:  Radiographs earlier this day. FINDINGS: Highly comminuted and displaced fracture of the humeral head and neck. There is a curvilinear fragment displaced posterior medially measuring approximately 5 cm, likely arising from the posterior head. This abuts the posterior superior aspect of the glenoid. This is displaced greater than 1 cm. Fracture involvement through the bicipital groove. There is displaced involvement of the greater tuberosity and nondisplaced fracture of the lesser tuberosity. Fracture involves the surgical and anatomic neck. No additional fracture of the glenoid, scapula or clavicle. There is an associated joint effusion and subcutaneous edema most significant laterally. Edema tracks in the axilla. No fracture of the included left ribs for acute intrathoracic process in the left hemithorax. IMPRESSION: Highly comminuted and displaced proximal humeral head and neck fracture with involvement  of the greater and lesser tuberosities and displaced fragment abutting the posterior superior glenoid. Electronically Signed   By: Jeb Levering M.D.   On: 07/28/2015 23:34   Dg Shoulder Left  07/28/2015  CLINICAL DATA:  Unwitnessed fall this morning, with LEFT shoulder and LEFT hip injury. History of hypertension and diabetes. EXAM: LEFT SHOULDER - 2+ VIEW COMPARISON:  None. FINDINGS: Acute comminuted LEFT humeral head and neck fracture with greater than 3 bony fragments, and impaction. Fragments are generally aligned. No dislocation though, there is mild widening of the subacromial joint space. No destructive bony lesions. Large body habitus. IMPRESSION: Acute nondisplaced LEFT humeral head and neck fracture. Widening subacromial joint space could represent effusion or ligamentous injury without frank dislocation. Electronically Signed   By: Elon Alas M.D.   On: 07/28/2015 22:08   Dg Hip Unilat With Pelvis 2-3 Views Left  07/28/2015  CLINICAL DATA:  Unwitnessed fall this morning, with LEFT shoulder and LEFT hip injury. History of hypertension and diabetes. EXAM: DG HIP (WITH OR WITHOUT PELVIS) 2-3V LEFT COMPARISON:  None. FINDINGS: Comminuted nondisplaced acute LEFT femur intertrochanteric fracture with minimal distraction of the bony fragments. Femoral head is well formed and located. No dislocation. No destructive bony lesions. Surgical clips at pubic symphysis. Phleboliths in the pelvis. IMPRESSION: Acute  LEFT intertrochanteric femur fracture with minimal bony distraction. No dislocation. Electronically Signed   By: Elon Alas M.D.   On: 07/28/2015 22:12     Assessment and Plan   1. Elevated troponin: -Patient suffered a mechanical fall and was down on the floor for 14+ hours with her fracture left hip and left shoulder. She had taken off her Life Alert button and could not reach to telephone -Troponin has continued to trend upwards to a level of 1.22 this morning. She has never had  any chest pain or SOB until this morning when she began to develop some mild SOB. Continue to cycle troponin until this level peaks and trends downward -Recommend further evaluation this weekend for possible PE given her down time, elevated troponin, body habitus, and fractured hip with VQ scan or CTA PE protocol. I Anita Small leave ordering this to IM as I half day today and cannot follow upon these results until 2/5 -If the above is negative she Anita Small need cardiac catheterization on Monday, 08/01/15 for evaluation of ischemia. If intervention is required this Anita Small need to be discussed with ortho regarding timing of her impending hip surgery  2. Left hip fracture/left proximal humerus fracture: -Surgery has been postponed at this time pending work up and treatment of the above -Possible left shoulder replacement at a later date per ortho  3. Sinus tachycardia: -Possible PE given her down time, elevated troponin, body habitus, and fracture? -Echo did not show a dilated RV, though this is not definitive  -Recommend further evaluation with VQ scan or CTA PE protocol today   4. HTN: -Continue current medications  Signed, Christell Faith, PA-C Pager: [phone] 07/30/2015, 11:28 AM  I have seen and examined this patient with Christell Faith.  Agree with above, note added to reflect my findings.  On exam, tachycardic, regular, no murmurs, lungs clear.  Significant bruising on upper extremity.  Had fall with femur fracture.  Troponin elevated.  Now complaining of SOB.  Would agree with checking for V/Q or CTPA for PE.  If negative, would recommend heart cath vs. Stress testing Monday to see if she does have evidence of ischemias as troponin continues to be elevated.  Cormac Wint M. Jamarco Zaldivar MD 07/30/2015 12:21 PM

## 2015-08-01 NOTE — Care Management Important Message (Signed)
Important Message  Patient Details  Name: Anita Small MRN: [id number] Date of Birth: August 16, 1950   Medicare Important Message Given:  Yes    Dex Blakely A, RN 08/01/2015, 7:28 AM

## 2015-08-01 NOTE — Progress Notes (Signed)
Called Cardiac Cath to advise no orders to hold anticoagulants but saw in note to hold Lovenox.  Lovenox was given after note was found.  Cardiac Cath department advised they will confirm if there is an issue.  Since they plan to access through the radius they don't expect it to be a problem.    Also advised IV access is on right arm due to restrictions to left arm with bruising, swelling and pain.  They stated this should be fine to work around.  Will come to get patient for procedure soon.

## 2015-08-01 NOTE — Progress Notes (Signed)
1250 - 2 cc of air removed from TR band and band removed.  Pressure dressing applied to site.  No c/o from patient and no distress noted.

## 2015-08-01 NOTE — Progress Notes (Signed)
1230 - 3 cc of air removed from TR band

## 2015-08-01 NOTE — Progress Notes (Signed)
1130 - 3 cc of air removed from TR band

## 2015-08-01 NOTE — Progress Notes (Signed)
Transport here to take patient to cardiac cath.

## 2015-08-01 NOTE — Progress Notes (Signed)
Patient had stent intervention during cardiac cath and is being transferred to 1A for post cath/stent placement protocol.  Report given to Manuela Schwartz.  Belongings moved to room 246.

## 2015-08-01 NOTE — Progress Notes (Signed)
Clinical Education officer, museum (CSW) discussed case with MD this morning. Patient has not received surgery for hip or shoulder because she has not cleared by Cardiology. Patient is having a cardiac cath this morning and the plan is for hip surgery tomorrow. CSW made [facility] liaison aware of above. Per Broadus John he cannot move forward with [facility] authorization until PT and OT evaluate patient after surgery. CSW will continue to follow and assist as needed.   Blima Rich, LCSW [phone]

## 2015-08-01 NOTE — Progress Notes (Signed)
Inpatient Diabetes Program Recommendations  AACE/ADA: New Consensus Statement on Inpatient Glycemic Control (2015)  Target Ranges:  Prepandial:   less than 140 mg/dL      Peak postprandial:   less than 180 mg/dL (1-2 hours)      Critically ill patients:  140 - 180 mg/dL   Review of Glycemic Control:  Results for NIKERIA, GOLDRING (MRN [id number]) as of 08/01/2015 10:11  Ref. Range 07/31/2015 08:00 07/31/2015 11:28 07/31/2015 16:08 07/31/2015 19:43 08/01/2015 07:49  Glucose-Capillary Latest Ref Range: 65-99 mg/dL 192 (H) 221 (H) 222 (H) 147 (H) 170 (H)   Diabetes history: Type 2 diabetes Outpatient Diabetes medications: Humalog 50 units tid with meals, Janumet 50-1000 mg bid with meal Current orders for Inpatient glycemic control:  Novolog resistant tid with meals and HS, Tradjenta 5 mg daily  Inpatient Diabetes Program Recommendations:    Note patient is currently in cath lab.  Reviewed blood glucoses.  Note that patient was on insulin prior to admit.  Please consider adding Levemir 20 units daily and Novolog meal coverage 6 units tid with meals (Hold if patient eats less than 50%).  Also please check A1C to determine glycemic control for past 3 months (last A1C in care everywhere was 7.1% 03/16/15).  Thanks, Adah Perl, RN, BC-ADM Inpatient Diabetes Coordinator Pager [phone] (8a-5p)

## 2015-08-02 LAB — CBC
HEMATOCRIT: 27.8 % — AB (ref 35.0–47.0)
HEMOGLOBIN: 9.1 g/dL — AB (ref 12.0–16.0)
MCH: 28.5 pg (ref 26.0–34.0)
MCHC: 32.7 g/dL (ref 32.0–36.0)
MCV: 87.2 fL (ref 80.0–100.0)
Platelets: 279 10*3/uL (ref 150–440)
RBC: 3.19 MIL/uL — ABNORMAL LOW (ref 3.80–5.20)
RDW: 15.8 % — ABNORMAL HIGH (ref 11.5–14.5)
WBC: 5.8 10*3/uL (ref 3.6–11.0)

## 2015-08-02 LAB — CK ISOENZYMES
CK MB: 2 % (ref 0–3)
CK-BB: 0 %
CK-MM: 98 % (ref 97–100)
CREATINE KINASE-TOTAL: 294 U/L — AB (ref 24–173)
MACRO TYPE 1: 0 %
MACRO TYPE 2: 0 %

## 2015-08-02 LAB — BASIC METABOLIC PANEL
ANION GAP: 10 (ref 5–15)
BUN: 20 mg/dL (ref 6–20)
CO2: 26 mmol/L (ref 22–32)
Calcium: 8.2 mg/dL — ABNORMAL LOW (ref 8.9–10.3)
Chloride: 102 mmol/L (ref 101–111)
Creatinine, Ser: 0.82 mg/dL (ref 0.44–1.00)
GFR calc Af Amer: >60 mL/min (ref >60)
GLUCOSE: 182 mg/dL — AB (ref 65–99)
POTASSIUM: 3.7 mmol/L (ref 3.5–5.1)
Sodium: 138 mmol/L (ref 135–145)

## 2015-08-02 LAB — GLUCOSE, CAPILLARY
GLUCOSE-CAPILLARY: 129 mg/dL — AB (ref 65–99)
Glucose-Capillary: 164 mg/dL — ABNORMAL HIGH (ref 65–99)
Glucose-Capillary: 240 mg/dL — ABNORMAL HIGH (ref 65–99)
Glucose-Capillary: 178 mg/dL — ABNORMAL HIGH (ref 65–99)

## 2015-08-02 LAB — MRSA PCR SCREENING: MRSA by PCR: NEGATIVE

## 2015-08-02 MED ORDER — TRAMADOL HCL 50 MG PO TABS
50.0000 mg | ORAL_TABLET | Freq: Four times a day (QID) | ORAL | Status: DC | PRN
  Administered 2015-08-02 – 2015-08-03 (×3): 50 mg via ORAL
  Filled 2015-08-02 (×3): qty 1

## 2015-08-02 NOTE — Progress Notes (Signed)
Hospital Problem List     Principal Problem:   Femur fracture, left (HCC) Active Problems:   Hypothyroidism   Type 2 diabetes mellitus (HCC)   Essential hypertension   GERD   HLD (hyperlipidemia)   Fracture of left humerus   Elevated troponin   Fall   NSTEMI (non-ST elevated myocardial infarction) [facility])    Patient Profile:   65 y.o. female w/ PMH of carotid stenosis (s/p CEA vs stenting per patient), DM2, HTN, HLD, prior tobacco abuse, obesity,and hypothyroidism who presented to [facility] on 07/28/2015 s/p mechanical fall and was down on the floor for 14+ hours. Cards consulted on 2/3 for elevated troponin and surgical clearance. Cath on 08/01/2015 with DES to Dist Cx.    Subjective   Denies any current chest pain, palpitations, or dyspnea. Does report diaphoresis, mostly along her head. Also reports having episodes of hallucinations seeing her cat and feathers floating along the room. She is able to recognize these are hallucinations as she knows her cat is not here. Alert and Oriented x 3 currently. Denies any blurred vision. No slurred speech noted.   Inpatient Medications    . aspirin  81 mg Oral Daily  . azithromycin  500 mg Intravenous Q24H  . citalopram  40 mg Oral Daily  . clopidogrel  75 mg Oral Q breakfast  . docusate sodium  100 mg Oral BID  . enoxaparin (LOVENOX) injection  40 mg Subcutaneous Q24H  . fenofibrate  160 mg Oral Daily  . guaiFENesin  600 mg Oral BID  . insulin aspart  0-20 Units Subcutaneous TID WC  . insulin aspart  0-5 Units Subcutaneous QHS  . levothyroxine  100 mcg Oral QAC breakfast  . linagliptin  5 mg Oral Daily  . metoprolol tartrate  50 mg Oral BID  . piperacillin-tazobactam (ZOSYN)  IV  4.5 g Intravenous 3 times per day  . simvastatin  40 mg Oral QHS  . sodium chloride flush  3 mL Intravenous Q12H  . sodium chloride flush  3 mL Intravenous Q12H  . vancomycin  1,250 mg Intravenous Q12H    Vital Signs    Filed Vitals:   08/01/15 2245  08/02/15 0012 08/02/15 0359 08/02/15 0807  BP: 120/59 103/56 137/96 155/57  Pulse: 109 88 85 84  Temp:  98 F (36.7 C) 97.8 F (36.6 C)   TempSrc:  Oral    Resp:  20 20   Height:      Weight:      SpO2:  90% 100% 99%    Intake/Output Summary (Last 24 hours) at 08/02/15 1249 Last data filed at 08/02/15 1126  Gross per 24 hour  Intake    600 ml  Output   1950 ml  Net  -1350 ml   Filed Weights   07/29/15 0242 08/01/15 1402  Weight: 262 lb 3.2 oz (118.933 kg) 275 lb 1.6 oz (124.785 kg)    Physical Exam    General: Obese caucasian female appearing in no acute distress. Ecchymosis extending along left arm. Diaphoretic on appearance. Head: Normocephalic, atraumatic.  Neck: Supple without bruits, JVD not elevated. Lungs:  Resp regular and unlabored, CTA without wheezing or rales. Heart: RRR, S1, S2, no S3, S4, or murmur; no rub. Abdomen: Soft, non-tender, non-distended with normoactive bowel sounds. No hepatomegaly. No rebound/guarding. No obvious abdominal masses. Extremities: No clubbing, cyanosis, or edema. Distal pedal pulses are 2+ bilaterally. Right radial cath site without ecchymosis or evidence of a hematoma or bruit. Neuro:  Alert and oriented X 3. Moves all extremities spontaneously. Psych: Normal affect.  Labs    CBC  Recent Labs  08/01/15 0442 08/02/15 0531  WBC 7.5 5.8  HGB 9.1* 9.1*  HCT 28.1* 27.8*  MCV 85.9 87.2  PLT 234 [id number]   Basic Metabolic Panel  Recent Labs  08/01/15 0442 08/02/15 0531  NA  --  138  K  --  3.7  CL  --  102  CO2  --  26  GLUCOSE  --  182*  BUN  --  20  CREATININE 0.87 0.82  CALCIUM  --  8.2*  MG 2.0  --    Cardiac Enzymes  Recent Labs  07/31/15 0404 08/01/15 0442  TROPONINI 1.83* 1.55*     Telemetry    NSR, HR in 70's - 80's. Occasional PVC's.  ECG    NSR with HR 88, no acute ST or T-wave changes, PVC's noted.   Cardiac Studies and Radiology    Dg Chest 1 View: 07/28/2015  CLINICAL DATA:  Unwitnessed fall  with left shoulder and hip injury. EXAM: CHEST 1 VIEW COMPARISON:  03/16/2015 FINDINGS: The cardiomediastinal contours are unchanged with atherosclerosis of the aortic arch. The lungs are clear. Pulmonary vasculature is normal. No consolidation, pleural effusion, or pneumothorax. Left proximal humerus fracture, partially included. IMPRESSION: 1.  No acute pulmonary process. 2. Left proximal humerus fracture, partially included. Electronically Signed   By: Jeb Levering M.D.   On: 07/28/2015 22:02   Ct Angio Chest Pe W/cm &/or Wo Cm: 07/30/2015  CLINICAL DATA:  Hypoxemia. Fall 2 days ago with left humerus fracture EXAM: CT ANGIOGRAPHY CHEST WITH CONTRAST TECHNIQUE: Multidetector CT imaging of the chest was performed using the standard protocol during bolus administration of intravenous contrast. Multiplanar CT image reconstructions and MIPs were obtained to evaluate the vascular anatomy. CONTRAST:  44mL OMNIPAQUE IOHEXOL 350 MG/ML SOLN COMPARISON:  Chest 07/28/2015 FINDINGS: Negative for pulmonary embolism. Pulmonary arteries normal in caliber. Atherosclerotic calcification in the aortic arch. No aortic aneurysm or dissection. Moderate coronary calcification. No pericardial effusion. Heart size normal. Extensive infiltrate in the right upper lobe, right lower lobe and right middle lobe consistent with pneumonia. Small area of infiltrate in the left lower lobe. Small right pleural effusion. Mild dependent atelectasis bilaterally. Negative for lung mass. Peritracheal lymph nodes are present measuring up to 13 mm in the right lower paratracheal node. No definite pathologic adenopathy. No acute abnormality in the upper abdomen. Negative for thoracic spine fracture. No acute skeletal abnormality. Review of the MIP images confirms the above findings. IMPRESSION: Negative for pulmonary embolism. Coronary calcification Extensive infiltrate throughout the right lung compatible with pneumonia. Smaller infiltrate in the left  lower lobe. Electronically Signed   By: Franchot Gallo M.D.   On: 07/30/2015 23:20   Dg Shoulder Left: 07/28/2015  CLINICAL DATA:  Unwitnessed fall this morning, with LEFT shoulder and LEFT hip injury. History of hypertension and diabetes. EXAM: LEFT SHOULDER - 2+ VIEW COMPARISON:  None. FINDINGS: Acute comminuted LEFT humeral head and neck fracture with greater than 3 bony fragments, and impaction. Fragments are generally aligned. No dislocation though, there is mild widening of the subacromial joint space. No destructive bony lesions. Large body habitus. IMPRESSION: Acute nondisplaced LEFT humeral head and neck fracture. Widening subacromial joint space could represent effusion or ligamentous injury without frank dislocation. Electronically Signed   By: Elon Alas M.D.   On: 07/28/2015 22:08   Dg Hip Unilat With Pelvis 2-3 Views Left:  07/28/2015  CLINICAL DATA:  Unwitnessed fall this morning, with LEFT shoulder and LEFT hip injury. History of hypertension and diabetes. EXAM: DG HIP (WITH OR WITHOUT PELVIS) 2-3V LEFT COMPARISON:  None. FINDINGS: Comminuted nondisplaced acute LEFT femur intertrochanteric fracture with minimal distraction of the bony fragments. Femoral head is well formed and located. No dislocation. No destructive bony lesions. Surgical clips at pubic symphysis. Phleboliths in the pelvis. IMPRESSION: Acute LEFT intertrochanteric femur fracture with minimal bony distraction. No dislocation. Electronically Signed   By: Elon Alas M.D.   On: 07/28/2015 22:12    Echocardiogram: 07/29/2015 Study Conclusions - Procedure narrative: Transthoracic echocardiography. Image quality was poor. The study was technically difficult, as a result of poor acoustic windows, poor sound wave transmission, and body habitus. - Left ventricle: The cavity size was normal. There was mild concentric hypertrophy. Systolic function was normal. The estimated ejection fraction was in the range of  60% to 65%. Regional wall motion abnormalities cannot be excluded. Doppler parameters are consistent with abnormal left ventricular relaxation (grade 1 diastolic dysfunction). - Mitral valve: Calcified annulus. There was mild regurgitation. - Left atrium: The atrium was mildly dilated. - Right ventricle: Systolic function was normal. - Pulmonary arteries: PA peak pressure: 40 mm Hg (S).  Impressions: - Challenging image quality.   Cardiac Catheterization: 08/01/2015  Ramus lesion, 30% stenosed.  Dist LAD lesion, 50% stenosed.  Prox LAD to Mid LAD lesion, 20% stenosed.  Prox RCA to Mid RCA lesion, 70% stenosed.  Dist Cx lesion, 99% stenosed. Post intervention, there is a 0% residual stenosis.  1. Severe one-vessel coronary artery disease with 99% stenosis in the distal left circumflex. There is 70% mid RCA stenosis but the vessel is small and nondominant. Moderate LAD stenosis distally. The coronary arteries are overall mildly calcified. 2. Normal LV systolic function by echocardiogram. Mildly elevated left ventricular end-diastolic pressure. 3. Successful angioplasty and drug-eluting stent placement to the distal left circumflex.  Recommendations: This is overall a difficult situation. I recommend postponing the hip surgery for 1 month. After that time, the patient can proceed with surgery at an overall moderate risk. The stent used was a second generation drug-eluting stent and the data has shown that risk of late stent thrombosis is less with this then with a bare-metal stent. She does not need any further revascularization.  Assessment & Plan    1. Non-ST elevation myocardial infarction - Troponin peaked at 1.83 on 07/31/2015. - Cardiac catheterization on 08/01/2015 showed severe one-vessel coronary artery disease and she is now s/p angioplasty and DES placement to the distal left circumflex.  - Continue dual antiplatelet therapy with ASA and Plavix. Continue BB and  statin.  2. Left hip fracture/left proximal humerus fracture -Surgery was postponed due to myocardial infarction. - Per Dr. Tyrell Antonio note on 08/01/2015: I discussed the case with Dr. Rudene Christians about the timing of hip repair surgery. Ideally, the surgery should be postponed one month after stent placement. However, this is probably not a viable option as the patient will become bedridden if we choose this. Dr. Rudene Christians feels that the surgery can be done while the patient is on dual antiplatelet therapy without interruption.I think the best option is to proceed with surgery later this week with the understanding that she will be at least moderate risk for cardiovascular complications. I discussed this extensively with the patient and she understands the risk and she wants to proceed with surgery as she feels that she has no quality of life at the  present time without repair of her hip fracture.  3. Sinus tachycardia - HR improved into the 70's - 80's.  - Metoprolol recently increased to 50 mg BID. Would continue with this current dose for now.  4. HTN - BP has been 103/56 - 155/96 in the past 24 hours. - Continue current medications  5. Altered Mental Status - patient reports hitting her head during her recent fall. - having hallucinations this morning, seeing her cat in the room and feathers floating across her room. Denies any double vision or vision changes. No slurred speech or facial drooping noted on exam. - this is likely due to new pain medications she is receiving this admission. Could consider switching her current pain medications. Could also consider obtaining Head CT as she does report hitting her head during the fall and a Head CT has not been obtained this admission.  Signed, Erma Heritage , PA-C 12:49 PM 08/02/2015 Pager: [phone]

## 2015-08-02 NOTE — Care Management (Signed)
Patient admitted from home after falling at home and unable to get up for approximately 18 hours.  Fall resulted in left shoulder and left hip fractures.  Patient found to have positive troponins which have trended up.  Had cardiac cath 2/6 which required PCI.  Patient transferred to 2A.  Patient is going to require surgical intervention to repair hip and shoulder but cardiac status must stabilize first

## 2015-08-02 NOTE — Progress Notes (Signed)
Patient having hallucinations after receiving pain medication vicodin/norco. Dr. Clayton Bibles paged and RN asked for a different pain medication. MD gave orders to try tramadol instead.

## 2015-08-02 NOTE — Progress Notes (Signed)
[facility] [place] NAME: Anita Small    MR#:  [id number]  DATE OF BIRTH:  [birth year]  SUBJECTIVE:  Patient admitted 07/28/15 after sustaining a mechanical fall. She denied any chest pain preceding palpitations shortness of breath. She was found to have fracture of left femur as well as left proximal humerus fracture. Noted to have elevated troponin,  non-Q-wave MI,  started on therapeutic Lovenox,  cardiology consult was obtained. Cardiologist proceded to cardiac cath today. CTA of chest showed no pulmonary embolism but pneumonia, patient was initiated on broad-spectrum antibiotic therapy. She admits some cough with minimal sputum production, which is greenish in color and thick. Denies any fever or chills. Use of alcohol. Comfortable today. Denies any significant pain, although admits of shortness of breath on exertion, her O2 sats are good and she is down to 2-1/2 L of oxygen through nasal cannula which is her baseline Patient had cardiac catheterization yesterday which revealed diffuse coronary artery disease with occlusions from 30 to 50 percent. However, left circumflex distally was occluded at 99% and that drug-eluting stent was placed. Patient was initiated on Plavix. Patient feels good today. Denies any chest pains or discomfort. Overall, except of pain in shoulder. Bruising is subsiding, changing colors.  /REVIEW OF SYSTEMS:  CONSTITUTIONAL: No fever, fatigue or weakness.  EYES: No blurred or double vision.  EARS, NOSE, AND THROAT: No tinnitus or ear pain.  RESPIRATORY: No cough, shortness of breath, wheezing or hemoptysis.  CARDIOVASCULAR: No chest pain, orthopnea, edema.  GASTROINTESTINAL: No nausea, vomiting, diarrhea or abdominal pain.  GENITOURINARY: No dysuria, hematuria.  ENDOCRINE: No polyuria, nocturia,  HEMATOLOGY: No anemia, easy bruising or bleeding SKIN: No rash or lesion. MUSCULOSKELETAL: Pain in the left hip as well as  left shoulder NEUROLOGIC: No tingling, numbness, weakness.  PSYCHIATRY: No anxiety or depression.   DRUG ALLERGIES:   Allergies  Allergen Reactions  . Codeine Phosphate     REACTION: unspecified  . Duloxetine     REACTION: unspecified  . Meperidine Hcl     REACTION: unspecified  . Venlafaxine     REACTION: unspecified  . Doxycycline Rash    VITALS:  Blood pressure 155/57, pulse 84, temperature 97.8 F (36.6 C), temperature source Oral, resp. rate 20, height 5' 2.01" (1.575 m), weight 124.785 kg (275 lb 1.6 oz), SpO2 99 %.  PHYSICAL EXAMINATION:  VITAL SIGNS: Filed Vitals:   08/02/15 0359 08/02/15 0807  BP: 137/96 155/57  Pulse: 85 84  Temp: 97.8 F (36.6 C)   Resp: 66    GENERAL:64 y.o.female currently in no acute distress.  HEAD: Normocephalic, atraumatic.  EYES: Pupils equal, round, reactive to light. Extraocular muscles intact. No scleral icterus.  MOUTH: Moist mucosal membrane. Dentition intact. No abscess noted.  EAR, NOSE, THROAT: Clear without exudates. No external lesions.  NECK: Supple. No thyromegaly. No nodules. No JVD.  PULMONARY: Diminished to ascultation, especially at bases due to poor respiratory effort, few scattered wheezes,  rales , and intermittent rhonci. No use of accessory muscles, Good respiratory effort. good air entry bilaterally.  CHEST: Nontender to palpation.  CARDIOVASCULAR: S1 and S2. Regular rate and rhythm. No murmurs, rubs, or gallops. No edema. Pedal pulses 2+ bilaterally.  GASTROINTESTINAL: Soft, nontender, nondistended. No masses. Positive bowel sounds. No hepatosplenomegaly.  MUSCULOSKELETAL: No swelling, clubbing, or edema. Range of motion limited left sided extremities given fracture  NEUROLOGIC: Cranial nerves II through XII are intact. No gross focal neurological deficits. Sensation intact.  Reflexes intact.  SKIN: Large area of ecchymosis left shoulder left hip No ulceration, lesions, rashes, or cyanosis. Skin warm and dry. Turgor  intact. Swelling persist in the shoulder area, as well as multicolored bruising PSYCHIATRIC: Mood, affect within normal limits. The patient is awake, alert and oriented x 3. Insight, judgment intact.      LABORATORY PANEL:   CBC  Recent Labs Lab 08/02/15 0531  WBC 5.8  HGB 9.1*  HCT 27.8*  PLT 279   ------------------------------------------------------------------------------------------------------------------  Chemistries   Recent Labs Lab 07/28/15 2104  08/01/15 0442 08/02/15 0531  NA 141  < >  --  138  K 4.0  < >  --  3.7  CL 106  < >  --  102  CO2 22  < >  --  26  GLUCOSE 217*  < >  --  182*  BUN 25*  < >  --  20  CREATININE 1.00  < > 0.87 0.82  CALCIUM 8.8*  < >  --  8.2*  MG  --   --  2.0  --   AST 26  --   --   --   ALT 34  --   --   --   ALKPHOS 53  --   --   --   BILITOT 0.7  --   --   --   < > = values in this interval not displayed. ------------------------------------------------------------------------------------------------------------------  Cardiac Enzymes  Recent Labs Lab 08/01/15 0442  TROPONINI 1.55*   ------------------------------------------------------------------------------------------------------------------  RADIOLOGY:  No results found.  EKG:   Orders placed or performed during the hospital encounter of 07/28/15  . EKG 12-Lead  . EKG 12-Lead  . EKG 12-Lead  . EKG 12-Lead  . EKG 12-Lead  . EKG 12-Lead  . EKG 12-Lead immediately post procedure  . EKG 12-Lead  . EKG 12-Lead immediately post procedure  . EKG 12-Lead    ASSESSMENT AND PLAN:  65 year old Caucasian female history of non-insulin requiring type 2 diabetes essential hypertension hyperlipidemia unspecified presenting with mechanical fall admitted2/2/17 for left femur intertrochanteric fracture and left humerus nondisplaced fracture. With elevated troponin  1. 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 83 ,   cardiology input appreciated, status post cardiac on 08/01/2015 revealing 99% occlusion and distal circumflex, drug-eluting stent was placed with 0 residual stenosis, continue patient on Plavix, continue aspirin, metoprolol and Zocor, echocardiogram was unremarkable.  No PE.    2. Right lung pneumonia, PCR for MRSA was negative, continue Zosyn ,  Zithromax for now, sputum cultures are pending,  off vancomycin as MRSA PCR was negative. Continue Humibid as well as duo nebs, improved clinically  3. Type 2 diabetes non-insulin-requiring: Holding metformin due to recent CT angiogram, cardiac catheterization , resume in the next 1-2 days , continue Accu-Chek with sliding scale coverage, blood glucose is ranging between 150-220  4. Left femur intertrochanteric fracture/ Left humerus fracture: Orthopedic surgery Dr. Rudene Christians involved in care will perform surgery on left femur intertrochanteric fracture at the end of the week, per discussions with cardiologist, pain management for now  5. Unspecified Hyperlipidemia: Statin, fenofibrate therapy  6. Venous thrombi embolism prophylactic: Currently prophylactic doses of Lovenox in setting of #1, no pulmonary embolism on CTA. Pulmonary arterial pressure is elevated at 40.   7. Cardiac arrhythmias, unspecified, magnesium level was normal, continue patient on metoprolol for myocardium stabilization, dose was advanced to 50 mg twice daily by cardiology  All the records are reviewed and case discussed with Care Management/Social Workerr. Management plans discussed with the patient, family and they are in agreement.  CODE STATUS: Full  TOTAL TIME TAKING CARE OF THIS PATIENT: 35 minutes.  Discussed with cardiology POSSIBLE D/C IN 3-4 DAYS, DEPENDING ON CLINICAL CONDITION.   Theodoro Grist M.D on 08/02/2015 at 1:42 PM  Between 7am to 6pm - Pager - [phone]  After 6pm: House Pager: - [phone]  [facility]  Office   [phone]  CC: Primary care physician; Lenard Simmer, MD

## 2015-08-03 LAB — GLUCOSE, CAPILLARY
GLUCOSE-CAPILLARY: 176 mg/dL — AB (ref 65–99)
Glucose-Capillary: 226 mg/dL — ABNORMAL HIGH (ref 65–99)
Glucose-Capillary: 178 mg/dL — ABNORMAL HIGH (ref 65–99)
Glucose-Capillary: 216 mg/dL — ABNORMAL HIGH (ref 65–99)

## 2015-08-03 MED ORDER — PIPERACILLIN-TAZOBACTAM 3.375 G IVPB
3.3750 g | Freq: Three times a day (TID) | INTRAVENOUS | Status: DC
  Administered 2015-08-03 – 2015-08-04 (×3): 3.375 g via INTRAVENOUS
  Filled 2015-08-03 (×4): qty 50

## 2015-08-03 MED ORDER — ATORVASTATIN CALCIUM 20 MG PO TABS
40.0000 mg | ORAL_TABLET | Freq: Every day | ORAL | Status: DC
  Administered 2015-08-03 – 2015-08-07 (×5): 40 mg via ORAL
  Filled 2015-08-03 (×5): qty 2

## 2015-08-03 NOTE — Progress Notes (Signed)
[facility] [place] NAME: Anita Small    MR#:  [id number]  DATE OF BIRTH:  1951-05-17  SUBJECTIVE:  Patient admitted 07/28/15 after sustaining a mechanical fall. She denied any chest pain preceding palpitations shortness of breath. She was found to have fracture of left femur as well as left proximal humerus fracture. Noted to have elevated troponin,  non-Q-wave MI,  started on therapeutic Lovenox,  cardiology consult was obtained. Cardiologist proceded to cardiac cath 08/01/15,left circumflex distally was occluded at 99% and that drug-eluting stent was placed. Patient was initiated on Plavix. . CTA of chest showed no pulmonary embolism but pneumonia, patient was initiated on broad-spectrum antibiotic therapy.   Patient feels good today. Denies any chest pains or discomfort,  except of pain in shoulder. Bruising is subsiding, changing colors. Patient refuses sibling to Left arm. Patient is to undergo surgical procedure to Left hip tomorrow, per patient's report /REVIEW OF SYSTEMS:  CONSTITUTIONAL: No fever, fatigue or weakness.  EYES: No blurred or double vision.  EARS, NOSE, AND THROAT: No tinnitus or ear pain.  RESPIRATORY: No cough, shortness of breath, wheezing or hemoptysis.  CARDIOVASCULAR: No chest pain, orthopnea, edema.  GASTROINTESTINAL: No nausea, vomiting, diarrhea or abdominal pain.  GENITOURINARY: No dysuria, hematuria.  ENDOCRINE: No polyuria, nocturia,  HEMATOLOGY: No anemia, easy bruising or bleeding SKIN: No rash or lesion. MUSCULOSKELETAL: Pain in the left hip as well as left shoulder NEUROLOGIC: No tingling, numbness, weakness.  PSYCHIATRY: No anxiety or depression.   DRUG ALLERGIES:   Allergies  Allergen Reactions  . Codeine Phosphate     REACTION: unspecified  . Duloxetine     REACTION: unspecified  . Meperidine Hcl     REACTION: unspecified  . Norco [Hydrocodone-Acetaminophen] Other (See Comments)   hallucinations  . Venlafaxine     REACTION: unspecified  . Doxycycline Rash    VITALS:  Blood pressure 115/71, pulse 68, temperature 98.3 F (36.8 C), temperature source Oral, resp. rate 20, height 5' 2.01" (1.575 m), weight 124.785 kg (275 lb 1.6 oz), SpO2 97 %.  PHYSICAL EXAMINATION:  VITAL SIGNS: Filed Vitals:   08/03/15 0752 08/03/15 1151  BP: 119/57 115/71  Pulse: 79 68  Temp: 97.9 F (36.6 C) 98.3 F (36.8 C)  Resp: 23 20   GENERAL:65 y.o.female currently in no acute distress.  HEAD: Normocephalic, atraumatic.  EYES: Pupils equal, round, reactive to light. Extraocular muscles intact. No scleral icterus.  MOUTH: Moist mucosal membrane. Dentition intact. No abscess noted.  EAR, NOSE, THROAT: Clear without exudates. No external lesions.  NECK: Supple. No thyromegaly. No nodules. No JVD.  PULMONARY: Diminished to ascultation, especially at bases due to poor respiratory effort, few scattered wheezes,  rales , and intermittent rhonci. No use of accessory muscles, Good respiratory effort. good air entry bilaterally.  CHEST: Nontender to palpation.  CARDIOVASCULAR: S1 and S2. Regular rate and rhythm. No murmurs, rubs, or gallops. No edema. Pedal pulses 2+ bilaterally.  GASTROINTESTINAL: Soft, nontender, nondistended. No masses. Positive bowel sounds. No hepatosplenomegaly.  MUSCULOSKELETAL: No swelling, clubbing, or edema. Range of motion limited left sided extremities given fracture  NEUROLOGIC: Cranial nerves II through XII are intact. No gross focal neurological deficits. Sensation intact. Reflexes intact.  SKIN: Large area of ecchymosis left shoulder left hip No ulceration, lesions, rashes, or cyanosis. Skin warm and dry. Turgor intact. Swelling persist in the shoulder area, as well as multicolored bruising PSYCHIATRIC: Mood, affect within normal limits. The patient is awake,  alert and oriented x 3. Insight, judgment intact.      LABORATORY PANEL:   CBC  Recent Labs Lab  08/02/15 0531  WBC 5.8  HGB 9.1*  HCT 27.8*  PLT 279   ------------------------------------------------------------------------------------------------------------------  Chemistries   Recent Labs Lab 07/28/15 2104  08/01/15 0442 08/02/15 0531  NA 141  < >  --  138  K 4.0  < >  --  3.7  CL 106  < >  --  102  CO2 22  < >  --  26  GLUCOSE 217*  < >  --  182*  BUN 25*  < >  --  20  CREATININE 1.00  < > 0.87 0.82  CALCIUM 8.8*  < >  --  8.2*  MG  --   --  2.0  --   AST 26  --   --   --   ALT 34  --   --   --   ALKPHOS 53  --   --   --   BILITOT 0.7  --   --   --   < > = values in this interval not displayed. ------------------------------------------------------------------------------------------------------------------  Cardiac Enzymes  Recent Labs Lab 08/01/15 0442  TROPONINI 1.55*   ------------------------------------------------------------------------------------------------------------------  RADIOLOGY:  No results found.  EKG:   Orders placed or performed during the hospital encounter of 07/28/15  . EKG 12-Lead  . EKG 12-Lead  . EKG 12-Lead  . EKG 12-Lead  . EKG 12-Lead  . EKG 12-Lead  . EKG 12-Lead immediately post procedure  . EKG 12-Lead  . EKG 12-Lead immediately post procedure  . EKG 12-Lead    ASSESSMENT AND PLAN:  65 year old Caucasian female history of non-insulin requiring type 2 diabetes essential hypertension hyperlipidemia unspecified presenting with mechanical fall admitted2/2/17 for left femur intertrochanteric fracture and left humerus nondisplaced fracture. With elevated troponin  1. 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 83 ,  cardiology input appreciated, status post cardiac on 08/01/2015 revealing 99% occlusion in distal circumflex, drug-eluting stent was placed with 0 residual stenosis, continue patient on Plavix, aspirin, metoprolol and Zocor, echocardiogram was unremarkable.  No PE  on CTA.    2. Right lung pneumonia, PCR for MRSA was negative, continue Zosyn ,  Zithromax for now, sputum culture is still pending,  off vancomycin as MRSA PCR was negative. Continue Humibid as well as duo nebs, stable clinically. Oxygen saturations are good at 97% on 1 L of oxygen through nasal cannula  3. Type 2 diabetes non-insulin-requiring: Holding metformin due to recent CT angiogram, cardiac catheterization , resume  08/04/15 , patient is being continued on Tradjenta and sliding scale insulin,  continue Accu-Chek with sliding scale coverage, blood glucose is ranging between 130-220  4. Left femur intertrochanteric fracture/ Left humerus fracture: Orthopedic surgery Dr. Rudene Christians involved in care will perform surgery on left femur intertrochanteric fracture tomorrow. per discussions patient, pain management for now with tramadol, morphine  5. Unspecified Hyperlipidemia: Statin, fenofibrate therapy  6. Venous thrombi embolism prophylactic: Currently prophylactic doses of Lovenox in setting of #1, no pulmonary embolism on CTA. Pulmonary arterial pressure is elevated at 40.   7. Cardiac arrhythmias, unspecified, she has intermittent PVCs on telemetry, magnesium level was normal, continue patient on metoprolol for myocardium stabilization, dose was advanced to 50 mg twice daily by cardiology, good heart rate and blood pressure control   All the records are reviewed and case discussed with Care  Management/Social Workerr. Management plans discussed with the patient, family and they are in agreement.  CODE STATUS: Full  TOTAL TIME TAKING CARE OF THIS PATIENT: 35 minutes.  Discussed with patient extensively, emotional support provided, time spent approximately 10 minutes POSSIBLE D/C IN 3-4 DAYS, DEPENDING ON CLINICAL CONDITION.   Theodoro Grist M.D on 08/03/2015 at 12:24 PM  Between 7am to 6pm - Pager - [phone]  After 6pm: House Pager: - [phone]  [facility]  Office   [phone]  CC: Primary care physician; Lenard Simmer, MD

## 2015-08-03 NOTE — Progress Notes (Signed)
Pharmacy Antibiotic Note - Day 4  Anita Small is a 65 y.o. female admitted on 07/28/2015 with pneumonia.  Pharmacy has been consulted for Zosyn dosing.  Plan: Zosyn 3.375g IV q8h (4 hour infusion).  Height: 5' 2.01" (157.5 cm) Weight: 275 lb 1.6 oz (124.785 kg) IBW/kg (Calculated) : 50.12  Temp (24hrs), Avg:97.9 F (36.6 C), Min:97.4 F (36.3 C), Max:98.3 F (36.8 C)   Recent Labs Lab 07/28/15 2104 07/28/15 2322 07/29/15 0558 07/30/15 0837 07/31/15 0404 08/01/15 0442 08/01/15 1923 08/02/15 0531  WBC 13.4*  --  10.7 11.0 9.3 7.5  --  5.8  CREATININE 1.00  --  1.08* 0.85  --  0.87  --  0.82  LATICACIDVEN 1.9 1.4  --   --   --   --   --   --   VANCOTROUGH  --   --   --   --   --   --  7*  --     Estimated Creatinine Clearance: 85.2 mL/min (by C-G formula based on Cr of 0.82).    Allergies  Allergen Reactions  . Codeine Phosphate     REACTION: unspecified  . Duloxetine     REACTION: unspecified  . Meperidine Hcl     REACTION: unspecified  . Norco [Hydrocodone-Acetaminophen] Other (See Comments)    hallucinations  . Venlafaxine     REACTION: unspecified  . Doxycycline Rash    Antimicrobials this admission: Azithromycin 2/5>> Zosyn 2/5 >>  Vancomycin  2/5 >> 2/7     Dose adjustments this admission: Zosyn transitioned from 4.5g to 3.375g on 2/8.    Microbiology results: 2/5 Sputum: Rare Growth Negative Rods  2/5 MRSA PCR: Negative  Pharmacy will continue to monitor and adjust per consult.   Anita Small L 08/03/2015 2:34 PM

## 2015-08-03 NOTE — Care Management Important Message (Signed)
Important Message  Patient Details  Name: Anita Small MRN: [id number] Date of Birth: [birth year]   Medicare Important Message Given:  Yes    Juliann Pulse A Acelyn Basham 08/03/2015, 2:19 PM

## 2015-08-03 NOTE — Progress Notes (Signed)
Pain relieved with prn meds.  Continues to refuse sling to left arm/shoulder.  Bruising and edema continues.

## 2015-08-04 ENCOUNTER — Inpatient Hospital Stay: Payer: Medicare HMO | Admitting: Certified Registered Nurse Anesthetist

## 2015-08-04 ENCOUNTER — Encounter: Payer: Self-pay | Admitting: Student

## 2015-08-04 ENCOUNTER — Inpatient Hospital Stay: Payer: Medicare HMO

## 2015-08-04 ENCOUNTER — Encounter
Admission: EM | Disposition: A | Discharge status: Skilled Nursing Facility | Payer: Self-pay | Source: Home / Self Care | Attending: Internal Medicine

## 2015-08-04 HISTORY — PX: INTRAMEDULLARY (IM) NAIL INTERTROCHANTERIC: SHX5875

## 2015-08-04 LAB — CBC
HCT: 30.6 % — ABNORMAL LOW (ref 35.0–47.0)
Hemoglobin: 9.8 g/dL — ABNORMAL LOW (ref 12.0–16.0)
MCH: 28.2 pg (ref 26.0–34.0)
MCHC: 32.2 g/dL (ref 32.0–36.0)
MCV: 87.8 fL (ref 80.0–100.0)
PLATELETS: 394 10*3/uL (ref 150–440)
RBC: 3.49 MIL/uL — AB (ref 3.80–5.20)
RDW: 15.5 % — AB (ref 11.5–14.5)
WBC: 11.2 10*3/uL — AB (ref 3.6–11.0)

## 2015-08-04 LAB — CULTURE, RESPIRATORY W GRAM STAIN: Special Requests: NORMAL

## 2015-08-04 LAB — GLUCOSE, CAPILLARY
GLUCOSE-CAPILLARY: 177 mg/dL — AB (ref 65–99)
GLUCOSE-CAPILLARY: 188 mg/dL — AB (ref 65–99)
GLUCOSE-CAPILLARY: 259 mg/dL — AB (ref 65–99)
Glucose-Capillary: 192 mg/dL — ABNORMAL HIGH (ref 65–99)
Glucose-Capillary: 166 mg/dL — ABNORMAL HIGH (ref 65–99)

## 2015-08-04 LAB — CREATININE, SERUM
CREATININE: 0.76 mg/dL (ref 0.44–1.00)
GFR calc non Af Amer: >60 mL/min (ref >60)

## 2015-08-04 LAB — TYPE AND SCREEN
ABO/RH(D): O POS
Antibody Screen: NEGATIVE

## 2015-08-04 LAB — ABO/RH: ABO/RH(D): O POS

## 2015-08-04 LAB — CULTURE, RESPIRATORY

## 2015-08-04 SURGERY — FIXATION, FRACTURE, INTERTROCHANTERIC, WITH INTRAMEDULLARY ROD
Anesthesia: Choice | Laterality: Left

## 2015-08-04 MED ORDER — METOCLOPRAMIDE HCL 5 MG PO TABS: 5.0000 mg | ORAL_TABLET | Freq: Three times a day (TID) | ORAL | Status: DC | PRN

## 2015-08-04 MED ORDER — PROPOFOL 10 MG/ML IV BOLUS
INTRAVENOUS | Status: DC | PRN
  Administered 2015-08-04 (×2): 50 mg via INTRAVENOUS

## 2015-08-04 MED ORDER — SODIUM CHLORIDE 0.9 % IV SOLN
INTRAVENOUS | Status: DC
  Administered 2015-08-04: 18:00:00 via INTRAVENOUS

## 2015-08-04 MED ORDER — CIPROFLOXACIN IN D5W 400 MG/200ML IV SOLN
400.0000 mg | Freq: Two times a day (BID) | INTRAVENOUS | Status: DC
  Administered 2015-08-04 – 2015-08-05 (×2): 400 mg via INTRAVENOUS
  Filled 2015-08-04 (×3): qty 200

## 2015-08-04 MED ORDER — METOCLOPRAMIDE HCL 5 MG/ML IJ SOLN: 5.0000 mg | Freq: Three times a day (TID) | INTRAMUSCULAR | Status: DC | PRN

## 2015-08-04 MED ORDER — ONDANSETRON HCL 4 MG/2ML IJ SOLN: 4.0000 mg | Freq: Once | INTRAMUSCULAR | Status: DC | PRN

## 2015-08-04 MED ORDER — MORPHINE SULFATE (PF) 2 MG/ML IV SOLN: 2.0000 mg | INTRAVENOUS | Status: DC | PRN

## 2015-08-04 MED ORDER — NITROGLYCERIN 0.4 MG SL SUBL
0.4000 mg | SUBLINGUAL_TABLET | Freq: Once | SUBLINGUAL | Status: AC
  Administered 2015-08-04: 0.4 mg via SUBLINGUAL

## 2015-08-04 MED ORDER — OXYCODONE HCL 5 MG PO TABS
5.0000 mg | ORAL_TABLET | ORAL | Status: DC | PRN
  Administered 2015-08-04 – 2015-08-08 (×13): 10 mg via ORAL
  Filled 2015-08-04 (×13): qty 2

## 2015-08-04 MED ORDER — ENOXAPARIN SODIUM 40 MG/0.4ML ~~LOC~~ SOLN
40.0000 mg | Freq: Two times a day (BID) | SUBCUTANEOUS | Status: DC
  Administered 2015-08-05 – 2015-08-08 (×7): 40 mg via SUBCUTANEOUS
  Filled 2015-08-04 (×7): qty 0.4

## 2015-08-04 MED ORDER — PHENOL 1.4 % MT LIQD
1.0000 | OROMUCOSAL | Status: DC | PRN
  Filled 2015-08-04: qty 177

## 2015-08-04 MED ORDER — NITROGLYCERIN 2 % TD OINT
TOPICAL_OINTMENT | TRANSDERMAL | Status: AC
  Administered 2015-08-04: 1 [in_us] via TOPICAL
  Filled 2015-08-04: qty 1

## 2015-08-04 MED ORDER — LACTATED RINGERS IV SOLN
INTRAVENOUS | Status: DC | PRN
  Administered 2015-08-04: 14:00:00 via INTRAVENOUS

## 2015-08-04 MED ORDER — ENOXAPARIN SODIUM 40 MG/0.4ML ~~LOC~~ SOLN: 40.0000 mg | SUBCUTANEOUS | Status: DC

## 2015-08-04 MED ORDER — MENTHOL 3 MG MT LOZG
1.0000 | LOZENGE | OROMUCOSAL | Status: DC | PRN
  Filled 2015-08-04: qty 9

## 2015-08-04 MED ORDER — MORPHINE SULFATE (PF) 2 MG/ML IV SOLN
1.0000 mg | INTRAVENOUS | Status: AC
  Administered 2015-08-04: 1 mg via INTRAVENOUS

## 2015-08-04 MED ORDER — ALUM & MAG HYDROXIDE-SIMETH 200-200-20 MG/5ML PO SUSP: 30.0000 mL | ORAL | Status: DC | PRN

## 2015-08-04 MED ORDER — MIDAZOLAM HCL 2 MG/2ML IJ SOLN
INTRAMUSCULAR | Status: DC | PRN
  Administered 2015-08-04: 2 mg via INTRAVENOUS

## 2015-08-04 MED ORDER — FENTANYL CITRATE (PF) 100 MCG/2ML IJ SOLN
25.0000 ug | INTRAMUSCULAR | Status: DC | PRN
  Administered 2015-08-04 (×4): 25 ug via INTRAVENOUS

## 2015-08-04 MED ORDER — SUCCINYLCHOLINE CHLORIDE 20 MG/ML IJ SOLN
INTRAMUSCULAR | Status: DC | PRN
  Administered 2015-08-04: 100 mg via INTRAVENOUS

## 2015-08-04 MED ORDER — FENTANYL CITRATE (PF) 100 MCG/2ML IJ SOLN
INTRAMUSCULAR | Status: AC
  Administered 2015-08-04: 25 ug via INTRAVENOUS
  Filled 2015-08-04: qty 2

## 2015-08-04 MED ORDER — DEXTROSE 5 % IV SOLN: 500.0000 mg | Freq: Four times a day (QID) | INTRAVENOUS | Status: DC | PRN

## 2015-08-04 MED ORDER — NEOMYCIN-POLYMYXIN B GU 40-200000 IR SOLN
Status: DC | PRN
  Administered 2015-08-04: 2 mL

## 2015-08-04 MED ORDER — NITROGLYCERIN 0.4 MG SL SUBL
SUBLINGUAL_TABLET | SUBLINGUAL | Status: AC
  Administered 2015-08-04: 0.4 mg via SUBLINGUAL
  Filled 2015-08-04: qty 1

## 2015-08-04 MED ORDER — METHOCARBAMOL 500 MG PO TABS: 500.0000 mg | ORAL_TABLET | Freq: Four times a day (QID) | ORAL | Status: DC | PRN

## 2015-08-04 MED ORDER — NITROGLYCERIN 2 % TD OINT
1.0000 [in_us] | TOPICAL_OINTMENT | Freq: Once | TRANSDERMAL | Status: AC
  Administered 2015-08-04: 1 [in_us] via TOPICAL

## 2015-08-04 MED ORDER — FENTANYL CITRATE (PF) 100 MCG/2ML IJ SOLN
INTRAMUSCULAR | Status: DC | PRN
  Administered 2015-08-04: 25 ug via INTRAVENOUS
  Administered 2015-08-04: 100 ug via INTRAVENOUS
  Administered 2015-08-04: 50 ug via INTRAVENOUS

## 2015-08-04 MED ORDER — NITROGLYCERIN 0.4 MG SL SUBL: 0.4000 mg | SUBLINGUAL_TABLET | SUBLINGUAL | Status: DC | PRN

## 2015-08-04 SURGICAL SUPPLY — 38 items
BIT DRILL 4.3MMS DISTAL GRDTED (BIT) IMPLANT
CANISTER SUCT 1200ML W/VALVE (MISCELLANEOUS) ×1 IMPLANT
CHLORAPREP W/TINT 26ML (MISCELLANEOUS) ×5 IMPLANT
DRAPE INCISE IOBAN 66X60 STRL (DRAPES) ×2 IMPLANT
DRAPE SHEET LG 3/4 BI-LAMINATE (DRAPES) ×3 IMPLANT
DRAPE SURG 17X11 SM STRL (DRAPES) ×1 IMPLANT
DRAPE U-SHAPE 47X51 STRL (DRAPES) ×3 IMPLANT
DRILL 4.3MMS DISTAL GRADUATED (BIT) ×3
DRSG OPSITE POSTOP 4X6 (GAUZE/BANDAGES/DRESSINGS) ×9 IMPLANT
ELECT CAUTERY BLADE 6.4 (BLADE) ×3 IMPLANT
ELECT REM PT RETURN 9FT ADLT (ELECTROSURGICAL)
ELECTRODE REM PT RTRN 9FT ADLT (ELECTROSURGICAL) ×1 IMPLANT
GAUZE SPONGE 4X4 12PLY STRL (GAUZE/BANDAGES/DRESSINGS) ×1 IMPLANT
GLOVE BIOGEL PI IND STRL 9 (GLOVE) ×1 IMPLANT
GLOVE BIOGEL PI INDICATOR 9 (GLOVE) ×2
GLOVE SURG ORTHO 9.0 STRL STRW (GLOVE) ×3 IMPLANT
GOWN SPECIALTY ULTRA XL (MISCELLANEOUS) ×3 IMPLANT
GOWN STRL REUS W/ TWL LRG LVL3 (GOWN DISPOSABLE) ×1 IMPLANT
GOWN STRL REUS W/TWL LRG LVL3 (GOWN DISPOSABLE) ×3
GUIDE PIN ×4 IMPLANT
GUIDEPIN VERSANAIL DSP 3.2X444 ×4 IMPLANT
GUIDEWIRE BALL NOSE 80CM (WIRE) ×2 IMPLANT
HFN LH 130 DEG 9MM X 320MM (Nail) ×2 IMPLANT
IV NS 500ML (IV SOLUTION)
IV NS 500ML BAXH (IV SOLUTION) ×1 IMPLANT
KIT RM TURNOVER STRD PROC AR (KITS) ×3 IMPLANT
MAT BLUE FLOOR 46X72 FLO (MISCELLANEOUS) ×1 IMPLANT
NDL FILTER BLUNT 18X1 1/2 (NEEDLE) ×1 IMPLANT
NEEDLE FILTER BLUNT 18X 1/2SAF (NEEDLE) ×2
NEEDLE FILTER BLUNT 18X1 1/2 (NEEDLE) ×1 IMPLANT
PACK HIP COMPR (MISCELLANEOUS) ×3 IMPLANT
SCREW BONE CORTICAL 5.0X42 (Screw) ×2 IMPLANT
SCREW LAG 10.5MMX105MM HFN (Screw) ×2 IMPLANT
STAPLER SKIN PROX 35W (STAPLE) ×3 IMPLANT
SUT VIC AB 1 CT1 36 (SUTURE) ×3 IMPLANT
SUT VIC AB 2-0 CT1 (SUTURE) ×3 IMPLANT
SYRINGE 10CC LL (SYRINGE) ×3 IMPLANT
TAPE MICROFOAM 4IN (TAPE) ×3 IMPLANT

## 2015-08-04 NOTE — Progress Notes (Signed)
Spoke with OR nurse Rose, who is with Dr. Rudene Christians which said patient is able to go to ortho floor. Room number given to [facility] for recovery to call report prior to transport to room 155. Will update Producer, television/film/video on 1A.

## 2015-08-04 NOTE — Progress Notes (Signed)
Inpatient Diabetes Program Recommendations  AACE/ADA: New Consensus Statement on Inpatient Glycemic Control (2015)  Target Ranges:  Prepandial:   less than 140 mg/dL      Peak postprandial:   less than 180 mg/dL (1-2 hours)      Critically ill patients:  140 - 180 mg/dL   Review of Glycemic Control  Results for KOLBY, SHAD (MRN [id number]) as of 08/04/2015 10:12  Ref. Range 08/03/2015 07:26 08/03/2015 11:24 08/03/2015 16:35 08/03/2015 20:02 08/04/2015 07:36  Glucose-Capillary Latest Ref Range: 65-99 mg/dL 176 (H) 226 (H) 178 (H) 216 (H) 177 (H)    Diabetes history: Type 2 diabetes Outpatient Diabetes medications: Humalog 50 units tid with meals, Janumet 50-1000 mg bid with meal  Current orders for Inpatient glycemic control: Novolog resistant 0-20 units tid with meals and 0-5 units QHS, Tradjenta 5 mg daily  Inpatient Diabetes Program Recommendations:    Note that patient was on insulin prior to admit. Please consider adding Levemir 13 units daily (0.1unit/kg) and Novolog meal coverage 6 units tid with meals (Hold if patient eats less than 50%)  Once the patient returns from surgery.   Also please check A1C to determine glycemic control for past 3 months (last A1C in care everywhere was 7.1% 03/16/15).  Gentry Fitz, RN, BA, MHA, CDE Diabetes Coordinator Inpatient Diabetes Program  [phone] (Team Pager) [phone] ([place]) 08/04/2015 10:16 AM

## 2015-08-04 NOTE — Progress Notes (Signed)
Dr. Marcello Moores (anesthesiology) rounding on patient, instructed to go ahead and order type and screen. Surgery likely this afternoon.

## 2015-08-04 NOTE — Progress Notes (Signed)
[facility] at [place] NAME: Anita Small    MR#:  [id number]  DATE OF BIRTH:  [birth year]  SUBJECTIVE:  CHIEF COMPLAINT:   Chief Complaint  Patient presents with  . Fall   - Weighted after a fall, noted to have left hip fracture and also left shoulder fracture. Also had NSTEMI the elevated troponin. Cardiac catheter revealed 99% left circumflex blockage and patient had a stent put in on 08/01/2015 -For hip surgery today  REVIEW OF SYSTEMS:  Review of Systems  Constitutional: Positive for malaise/fatigue. Negative for fever and chills.  HENT: Negative for ear discharge, ear pain and tinnitus.   Respiratory: Negative for cough, shortness of breath and wheezing.   Cardiovascular: Negative for chest pain, palpitations and leg swelling.  Gastrointestinal: Negative for nausea, vomiting, abdominal pain, diarrhea and constipation.  Genitourinary: Negative for dysuria.  Musculoskeletal: Positive for myalgias and joint pain.       Left shoulder and also left hip pain  Neurological: Negative for dizziness, sensory change, speech change, focal weakness, seizures and headaches.  Psychiatric/Behavioral: Negative for depression.    DRUG ALLERGIES:   Allergies  Allergen Reactions  . Codeine Phosphate     REACTION: unspecified  . Duloxetine     REACTION: unspecified  . Meperidine Hcl     REACTION: unspecified  . Norco [Hydrocodone-Acetaminophen] Other (See Comments)    hallucinations  . Venlafaxine     REACTION: unspecified  . Doxycycline Rash    VITALS:  Blood pressure 96/84, pulse 87, temperature 98.2 F (36.8 C), temperature source Tympanic, resp. rate 18, height 5' 2.01" (1.575 m), weight 124.785 kg (275 lb 1.6 oz), SpO2 100 %.  PHYSICAL EXAMINATION:  Physical Exam  GENERAL:  65 y.o.-year-old patient lying in the bed with no acute distress.  EYES: Pupils equal, round, reactive to light and accommodation. No scleral icterus.  Extraocular muscles intact.  HEENT: Head atraumatic, normocephalic. Oropharynx and nasopharynx clear.  NECK:  Supple, no jugular venous distention. No thyroid enlargement, no tenderness.  LUNGS: Normal breath sounds bilaterally, no wheezing, rales,rhonchi or crepitation. No use of accessory muscles of respiration.  Decreased bibasilar breath sounds CARDIOVASCULAR: S1, S2 normal. No murmurs, rubs, or gallops.  ABDOMEN: Soft, nontender, nondistended. Bowel sounds present. No organomegaly or mass.  EXTREMITIES: No cyanosis, or clubbing. Left hip pain, marked for surgery for today Left arm and shoulder region with significant bruising all over NEUROLOGIC: Cranial nerves II through XII are intact. Muscle strength 5/5 in all extremities except left arm and leg limited due to pain from fractures. Sensation intact. Gait not checked.  PSYCHIATRIC: The patient is alert and oriented x 3.  SKIN: No obvious rash, lesion, or ulcer.    LABORATORY PANEL:   CBC  Recent Labs Lab 08/02/15 0531  WBC 5.8  HGB 9.1*  HCT 27.8*  PLT 279   ------------------------------------------------------------------------------------------------------------------  Chemistries   Recent Labs Lab 07/28/15 2104  08/01/15 0442 08/02/15 0531  NA 141  < >  --  138  K 4.0  < >  --  3.7  CL 106  < >  --  102  CO2 22  < >  --  26  GLUCOSE 217*  < >  --  182*  BUN 25*  < >  --  20  CREATININE 1.00  < > 0.87 0.82  CALCIUM 8.8*  < >  --  8.2*  MG  --   --  2.0  --  AST 26  --   --   --   ALT 34  --   --   --   ALKPHOS 53  --   --   --   BILITOT 0.7  --   --   --   < > = values in this interval not displayed. ------------------------------------------------------------------------------------------------------------------  Cardiac Enzymes  Recent Labs Lab 08/01/15 0442  TROPONINI 1.55*    ------------------------------------------------------------------------------------------------------------------  RADIOLOGY:  Dg C-arm 1-60 Min  08/04/2015  CLINICAL DATA:  Femur fracture. EXAM: DG C-ARM 61-120 MIN COMPARISON:  None. FINDINGS: Spot films were obtained documenting placement of compression screw and intramedullary rod across a intertrochanteric fracture. IMPRESSION: As above. Electronically Signed   By: Staci Righter M.D.   On: 08/04/2015 16:01   Dg Femur Min 2 Views Left  08/04/2015  CLINICAL DATA:  Elective surgery. EXAM: LEFT FEMUR 2 VIEWS COMPARISON:  None. FINDINGS: There has been a placement of left intra medullary rod with screw fixation proximally and distally. The alignment is anatomic. Poorly visualized irregularity of the supra trochanteric femoral neck may represent a fracture. There is no evidence of immediate complications. IMPRESSION: Satisfactory alignment of left femoral intra medullary rod and screw fixation. Electronically Signed   By: Fidela Salisbury M.D.   On: 08/04/2015 16:02    EKG:   Orders placed or performed during the hospital encounter of 07/28/15  . EKG 12-Lead  . EKG 12-Lead  . EKG 12-Lead  . EKG 12-Lead  . EKG 12-Lead  . EKG 12-Lead  . EKG 12-Lead immediately post procedure  . EKG 12-Lead  . EKG 12-Lead immediately post procedure  . EKG 12-Lead  . EKG 12-Lead  . EKG 12-Lead  . EKG 12-Lead  . EKG 12-Lead    ASSESSMENT AND PLAN:   65 year old female with past medical history significant for hypertension, hyperlipidemia, type 2 diabetes mellitus presented to the hospital after a fall. She was also noted to have elevated troponin and NSTEMI on admission along with left shoulder and left hip fractures.  #1 NSTEMI-appreciate cardiology consult. Patient had cardiac catheter done on 08/01/2015 showing 99% distal left circumflex stenosis and a 70% mid RCA stenosis. She had drug eluting stent put in the left circumflex. -Continue dual  antiplatelet treatment with aspirin and Plavix. Also on statin  #2 community-acquired pneumonia-zone Zosyn and azithromycin. -sputum cultures growing Pseudomonas. Started on Cipro and discontinue the other antibiotics. -Continue oxygen as needed.  #3 left hip intertrochanteric fracture-moderate risk for surgery due to recent acute MI and stent placement. -Appreciate orthopedic consult. For ORIF today. -Postsurgical DVT prophylaxis and pain management and physical therapy consult. Patient is stable after surgery, she can go to orthopedic floor with off unit telemetry  #4 left humeral head fracture-nondisplaced acute left humeral head and neck fracture. With significant bruising. -Likely will need of sling placement -Possible surgery when stable and recovered from her other acute events. -Physical therapy consult and pain management  #5 diabetes mellitus-on sliding scale insulin, Tradjenta Metformin on hold due to recent cardiac catheterization Also Invokana on hold  #6 hypertension-currently on metoprolol. Home lisinopril and hydrochlorothiazide are on hold. Monitor and add medications as needed  #7 DVT Prophylaxis- can be started after surgery today  All the records are reviewed and case discussed with Care Management/Social Workerr. Management plans discussed with the patient, family and they are in agreement.  CODE STATUS: Full Code  TOTAL TIME TAKING CARE OF THIS PATIENT: 40 minutes.   POSSIBLE D/C IN 3 DAYS,  DEPENDING ON CLINICAL CONDITION.   Kyleigh Nannini M.D on 08/04/2015 at 4:33 PM  Between 7am to 6pm - Pager - [phone]  After 6pm go to [email or web address] - password [email or web address] [facility]  Office  [phone]  CC: Primary care physician; Lenard Simmer, MD

## 2015-08-04 NOTE — Transfer of Care (Signed)
Immediate Anesthesia Transfer of Care Note  Patient: Anita Small  Procedure(s) Performed: Procedure(s): INTRAMEDULLARY (IM) NAIL INTERTROCHANTRIC (Left)  Patient Location: PACU  Anesthesia Type:General  Level of Consciousness: awake, alert  and oriented  Airway & Oxygen Therapy: Patient Spontanous Breathing and Patient connected to face mask oxygen  Post-op Assessment: Report given to RN and Post -op Vital signs reviewed and stable  Post vital signs: Reviewed and stable  Last Vitals:  Filed Vitals:   08/04/15 1326 08/04/15 1551  BP: 139/68 153/79  Pulse: 84 103  Temp: 36.3 C 36.8 C  Resp: 18 20    Complications: No apparent anesthesia complications

## 2015-08-04 NOTE — Anesthesia Procedure Notes (Signed)
Procedure Name: Intubation Date/Time: 08/04/2015 2:27 PM Performed by: Jonna Clark Pre-anesthesia Checklist: Patient identified, Patient being monitored, Timeout performed, Emergency Drugs available and Suction available Patient Re-evaluated:Patient Re-evaluated prior to inductionOxygen Delivery Method: Circle system utilized Preoxygenation: Pre-oxygenation with 100% oxygen Intubation Type: IV induction Ventilation: Mask ventilation without difficulty Laryngoscope Size: Miller and 2 Grade View: Grade I Tube type: Oral Tube size: 7.0 mm Number of attempts: 1 Placement Confirmation: ETT inserted through vocal cords under direct vision,  positive ETCO2 and breath sounds checked- equal and bilateral Secured at: 21 cm Tube secured with: Tape Dental Injury: Teeth and Oropharynx as per pre-operative assessment

## 2015-08-04 NOTE — Op Note (Signed)
07/28/2015 - 08/04/2015  3:48 PM  PATIENT:  Anita Small  65 y.o. female  PRE-OPERATIVE DIAGNOSIS:  hip left fracture intertrochanteric  POST-OPERATIVE DIAGNOSIS:  LEFT HIP FRACTURE  PROCEDURE:  Procedure(s): INTRAMEDULLARY (IM) NAIL INTERTROCHANTRIC (Left)  SURGEON: Laurene Footman, MD  ASSISTANTS: None  ANESTHESIA:   general  EBL:  Total I/O In: -  Out: 2600 [Urine:2600]  BLOOD ADMINISTERED:none  DRAINS: none   LOCAL MEDICATIONS USED:  NONE  SPECIMEN:  No Specimen  DISPOSITION OF SPECIMEN:  N/A  COUNTS:  YES  TOURNIQUET:  * No tourniquets in log *  IMPLANTS: Biomet affixes 9 x 320 mm with 105 lag screw 42 mm distal interlocking screw  DICTATION: .Dragon Dictation patient brought the operating room and after adequate general anesthesia was obtained she placed on the operative table C-arm was brought in and was some traction near anatomic alignment was obtained. After prepping and draping in sterile manner Barrier drape method of appropriate patient identification and timeout procedures were completed. A small proximal incision was made and a guide were inserted into the tip of the trochanter. Proximal reaming was carried out followed by placement of the long guidewire and rod length determination. Reaming was carried out to 11 mm with varus chatter. A 9 x 3 20 rod was inserted down the canal to the appropriate level. A small lateral incision was made and a guide were inserted into near center center position and a guidewire measurement made off of this. A second guidewire was placed through the antirotation screw hole to prevent rotation of the proximal fragment with screw insertion. Proximal drilling was carried out and then placement of the 105 mm lag screw. Traction was released and compression applied. The set screws placed proximally and then the insertion handle removed. The permanent C-arm views were obtained proximally and there is good alignment. Distally with the  leg abducted somewhat a single screw was placed in the slotted hole using standard technique drilling measuring and placing the 5.0 interlocking screw. C-arm view showed good position of the screw. The wounds were irrigated and then closed with #1 Vicryl deep 2-0 Vicryl subcutaneously and skin staples. Honeycomb dressing applied to each incision.  PLAN OF CARE: Continue as inpatient  PATIENT DISPOSITION:  PACU - hemodynamically stable.

## 2015-08-04 NOTE — Progress Notes (Signed)
Per Dr. Tressia Miners, if no complications during surgery, afterwards patient can move to ortho floor with off-unit telemetry monitoring.

## 2015-08-04 NOTE — OR Nursing (Signed)
Foley catheter replaced per Dr. Rudene Christians

## 2015-08-04 NOTE — Progress Notes (Signed)
Patient being taken down for surgery now. 1A nurse, Estill Bamberg, called ro receive report. No questions at this time. Will call her back when we hear how surgery went to confirm transfer and move patient belongings.

## 2015-08-04 NOTE — Progress Notes (Signed)
CSW was informed by RN that patient is going to have surgery this afternoon and possibly transfer back to 1C (Ortho) for continued care. CSW informed 1C CSW of above. CSW will continue to follow and assist.  Ernest Pine, MSW, [place] Work Department [phone]

## 2015-08-05 ENCOUNTER — Encounter: Payer: Self-pay | Admitting: Orthopedic Surgery

## 2015-08-05 LAB — BASIC METABOLIC PANEL
Anion gap: 8 (ref 5–15)
BUN: 11 mg/dL (ref 6–20)
CHLORIDE: 100 mmol/L — AB (ref 101–111)
CO2: 29 mmol/L (ref 22–32)
Calcium: 8.1 mg/dL — ABNORMAL LOW (ref 8.9–10.3)
Creatinine, Ser: 0.74 mg/dL (ref 0.44–1.00)
GFR calc non Af Amer: >60 mL/min (ref >60)
Glucose, Bld: 256 mg/dL — ABNORMAL HIGH (ref 65–99)
POTASSIUM: 3.5 mmol/L (ref 3.5–5.1)
SODIUM: 137 mmol/L (ref 135–145)

## 2015-08-05 LAB — GLUCOSE, CAPILLARY
GLUCOSE-CAPILLARY: 203 mg/dL — AB (ref 65–99)
GLUCOSE-CAPILLARY: 197 mg/dL — AB (ref 65–99)
Glucose-Capillary: 245 mg/dL — ABNORMAL HIGH (ref 65–99)
Glucose-Capillary: 217 mg/dL — ABNORMAL HIGH (ref 65–99)

## 2015-08-05 LAB — CBC
HEMATOCRIT: 27.9 % — AB (ref 35.0–47.0)
HEMOGLOBIN: 9.1 g/dL — AB (ref 12.0–16.0)
MCH: 28 pg (ref 26.0–34.0)
MCHC: 32.4 g/dL (ref 32.0–36.0)
MCV: 86.3 fL (ref 80.0–100.0)
Platelets: 339 10*3/uL (ref 150–440)
RBC: 3.23 MIL/uL — AB (ref 3.80–5.20)
RDW: 15.8 % — ABNORMAL HIGH (ref 11.5–14.5)
WBC: 7 10*3/uL (ref 3.6–11.0)

## 2015-08-05 MED ORDER — CIPROFLOXACIN HCL 500 MG PO TABS
500.0000 mg | ORAL_TABLET | Freq: Two times a day (BID) | ORAL | Status: DC
  Administered 2015-08-05 – 2015-08-08 (×6): 500 mg via ORAL
  Filled 2015-08-05 (×6): qty 1

## 2015-08-05 NOTE — Progress Notes (Signed)
Hospital Problem List     Principal Problem:   Femur fracture, left (HCC) Active Problems:   Hypothyroidism   Type 2 diabetes mellitus (HCC)   Essential hypertension   GERD   HLD (hyperlipidemia)   Fracture of left humerus   Elevated troponin   Fall   NSTEMI (non-ST elevated myocardial infarction) [facility])    Patient Profile:   65 y.o. female w/ PMH of carotid stenosis (s/p CEA vs stenting per patient), DM2, HTN, HLD, prior tobacco abuse, obesity,and hypothyroidism who presented to [facility] on 07/28/2015 s/p mechanical fall and was down on the floor for 14+ hours. Cards consulted on 2/3 for elevated troponin and surgical clearance. Cath on 08/01/2015 with DES to Dist Cx.Underwent surgical repair of her left femur on 08/04/2015.  Subjective   Reports feeling dizzy this morning after sitting up to work with PT. Having some post-op pain along her left hip. Denies any chest pain, palpitations, or dyspnea.  Inpatient Medications    . aspirin  81 mg Oral Daily  . atorvastatin  40 mg Oral q1800  . ciprofloxacin  400 mg Intravenous Q12H  . citalopram  40 mg Oral Daily  . clopidogrel  75 mg Oral Q breakfast  . docusate sodium  100 mg Oral BID  . enoxaparin (LOVENOX) injection  40 mg Subcutaneous Q12H  . fenofibrate  160 mg Oral Daily  . guaiFENesin  600 mg Oral BID  . insulin aspart  0-20 Units Subcutaneous TID WC  . insulin aspart  0-5 Units Subcutaneous QHS  . levothyroxine  100 mcg Oral QAC breakfast  . linagliptin  5 mg Oral Daily  . metoprolol tartrate  50 mg Oral BID  . sodium chloride flush  3 mL Intravenous Q12H  . sodium chloride flush  3 mL Intravenous Q12H    Vital Signs    Filed Vitals:   08/05/15 0424 08/05/15 0433 08/05/15 0807 08/05/15 1105  BP: 122/65 125/85 129/61 111/62  Pulse: 73 82 84 73  Temp: 99 F (37.2 C) 98.8 F (37.1 C) 98.5 F (36.9 C) 98.7 F (37.1 C)  TempSrc: Oral Oral Oral Oral  Resp: 18 18 18 18   Height:      Weight:      SpO2: 98% 94% 99%  100%    Intake/Output Summary (Last 24 hours) at 08/05/15 1202 Last data filed at 08/05/15 0850  Gross per 24 hour  Intake   1040 ml  Output   4650 ml  Net  -3610 ml   Filed Weights   07/29/15 0242 08/01/15 1402  Weight: 262 lb 3.2 oz (118.933 kg) 275 lb 1.6 oz (124.785 kg)    Physical Exam    General: Obese Caucasian female appearing in no acute distress. Head: Normocephalic, atraumatic.  Neck: Supple without bruits, JVD not elevated. Lungs:  Resp regular and unlabored, CTA without wheezing or rales. Heart: RRR, S1, S2, no S3, S4, or murmur; no rub. Abdomen: Soft, non-tender, non-distended with normoactive bowel sounds. No hepatomegaly. No rebound/guarding. No obvious abdominal masses. Extremities: No clubbing, cyanosis, or edema. Distal pedal pulses are 2+ bilaterally. Neuro: Alert and oriented X 3. Moves all extremities spontaneously. Psych: Normal affect.  Labs    CBC  Recent Labs  08/04/15 1832 08/05/15 0543  WBC 11.2* 7.0  HGB 9.8* 9.1*  HCT 30.6* 27.9*  MCV 87.8 86.3  PLT 394 [id number]   Basic Metabolic Panel  Recent Labs  08/04/15 1832 08/05/15 0543  NA  --  137  K  --  3.5  CL  --  100*  CO2  --  29  GLUCOSE  --  256*  BUN  --  11  CREATININE 0.76 0.74  CALCIUM  --  8.1*    Telemetry    NSR, HR in 70's - 90's. Frequent PVC's.  ECG    NSR, HR 96. PVC's present. No acute ischemic changes.   Cardiac Studies and Radiology    Dg C-arm 1-60 Min: 08/04/2015  CLINICAL DATA:  Femur fracture. EXAM: DG C-ARM 61-120 MIN COMPARISON:  None. FINDINGS: Spot films were obtained documenting placement of compression screw and intramedullary rod across a intertrochanteric fracture. IMPRESSION: As above. Electronically Signed   By: Staci Righter M.D.   On: 08/04/2015 16:01    Dg Femur Min 2 Views Left: 08/04/2015  CLINICAL DATA:  Elective surgery. EXAM: LEFT FEMUR 2 VIEWS COMPARISON:  None. FINDINGS: There has been a placement of left intra medullary rod with screw  fixation proximally and distally. The alignment is anatomic. Poorly visualized irregularity of the supra trochanteric femoral neck may represent a fracture. There is no evidence of immediate complications. IMPRESSION: Satisfactory alignment of left femoral intra medullary rod and screw fixation. Electronically Signed   By: Fidela Salisbury M.D.   On: 08/04/2015 16:02    Echocardiogram: 07/29/2015 Study Conclusions  - Procedure narrative: Transthoracic echocardiography. Image quality was poor. The study was technically difficult, as a result of poor acoustic windows, poor sound wave transmission, and body habitus. - Left ventricle: The cavity size was normal. There was mild concentric hypertrophy. Systolic function was normal. The estimated ejection fraction was in the range of 60% to 65%. Regional wall motion abnormalities cannot be excluded. Doppler parameters are consistent with abnormal left ventricular relaxation (grade 1 diastolic dysfunction). - Mitral valve: Calcified annulus. There was mild regurgitation. - Left atrium: The atrium was mildly dilated. - Right ventricle: Systolic function was normal. - Pulmonary arteries: PA peak pressure: 40 mm Hg (S).  Impressions: - Challenging image quality.  Assessment & Plan    1. Non-ST elevation myocardial infarction - Troponin peaked at 1.83 on 07/31/2015. - Cardiac catheterization on 08/01/2015 showed severe one-vessel coronary artery disease and she is now s/p angioplasty and DES placement to the distal left circumflex.  - denies any chest pain or anginal equivalents. No complications noted following her surgery. - Continue ASA, Plavix, BB and statin.  2. Left hip fracture/left proximal humerus fracture - Surgery was postponed due to myocardial infarction. Underwent surgical repair on 08/04/2015. - per Orthopedics  3. Sinus tachycardia/ Frequent PVC's - HR improved into the 70's - 90's.  - Continue Metoprolol 50  mg BID.   4. HTN - BP has been 96/60 - 153/99 in the past 24 hours. - Continue current medications  5. Altered Mental Status - patient reports hitting her head during her recent fall. - having hallucinations on 08/02/2015, seeing her cat in the room and feathers floating across her room. Denies any double vision or vision changes. No slurred speech or facial drooping noted on exam. - resolved following changes in her pain medication.  Signed, Erma Heritage , PA-C 12:02 PM 08/05/2015 Pager: [phone]

## 2015-08-05 NOTE — Progress Notes (Addendum)
PT is recommending SNF. Clinical Social Worker (CSW) met with patient to discuss D/C plan. Patient is still agreeable to going to [facility] for short term rehab. [facility] liaison started [employer] authorization today. Per MD patient will not be stable for D/C over the weekend. CSW will continue to follow and assist as needed.   Per [facility] authorization has been received. Per [facility] if patient is medically stable patient can D/C to [facility] on Sunday 08/07/15. Patient is aware of above.   Blima Rich, LCSW [phone]

## 2015-08-05 NOTE — Progress Notes (Signed)
Inpatient Diabetes Program Recommendations  AACE/ADA: New Consensus Statement on Inpatient Glycemic Control (2015)  Target Ranges:  Prepandial:   less than 140 mg/dL      [facility] postprandial:   less than 180 mg/dL (1-2 hours)      Critically ill patients:  140 - 180 mg/dL   Review of Glycemic Control  Results for Anita Small, Anita Small (MRN [id number]) as of 08/05/2015 08:47  Ref. Range 08/04/2015 07:36 08/04/2015 11:22 08/04/2015 13:30 08/04/2015 15:50 08/04/2015 21:03 08/05/2015 08:06  Glucose-Capillary Latest Ref Range: 65-99 mg/dL 177 (H) 188 (H) 192 (H) 166 (H) 259 (H) 245 (H)    Diabetes history: Type 2 diabetes Outpatient Diabetes medications: Humalog 50 units tid with meals, Janumet 50-1000 mg bid with meal  Current orders for Inpatient glycemic control: Novolog resistant 0-20 units tid with meals and 0-5 units QHS, Tradjenta 5 mg daily  Inpatient Diabetes Program Recommendations:   Note that patient was on insulin prior to admit. Please consider adding Levemir 13 units daily (0.1unit/kg)- fasting blood sugars are elevated  Also please check A1C to determine glycemic control for past 3 months (last A1C in care everywhere was 7.1% 03/16/15).  Gentry Fitz, RN, BA, MHA, CDE Diabetes Coordinator Inpatient Diabetes Program  [phone] (Team Pager) [phone] ([place]) 08/05/2015 8:51 AM

## 2015-08-05 NOTE — Progress Notes (Signed)
Physical Therapy Treatment Patient Details Name: Anita Small MRN: [id number] DOB: 03/21/1951 Today's Date: 08/05/2015    History of Present Illness Pt is a 65 y/o female here with L hip and shoulder fx.  She had ORIF of the hip    PT Comments    Pt reports she had another bout of diarrhea earlier and just generally feels poorly. She continues to have considerable pain with most acts but is better able to participate with exercises this afternoon.  Pt reporting some general nausea and headache and overall states that she is not feeling well.  Deferred mobility this afternoon.   Follow Up Recommendations  SNF     Equipment Recommendations       Recommendations for Other Services       Precautions / Restrictions Precautions Precautions: Fall;Shoulder Restrictions LUE Weight Bearing: Non weight bearing LLE Weight Bearing: Weight bearing as tolerated    Mobility  Bed Mobility               General bed mobility comments: deferred mobility acts this afternoon, pt feeling poorly and does not think she could even try sitting up  Transfers                    Ambulation/Gait                 Stairs            Wheelchair Mobility    Modified Rankin (Stroke Patients Only)       Balance                                    Cognition Arousal/Alertness: Awake/alert Behavior During Therapy: WFL for tasks assessed/performed Overall Cognitive Status: Within Functional Limits for tasks assessed                      Exercises General Exercises - Lower Extremity Ankle Circles/Pumps: AROM;10 reps Quad Sets: Strengthening;10 reps Gluteal Sets: Strengthening;10 reps Short Arc Quad: 10 reps;AROM Heel Slides: 10 reps;AROM;AAROM Hip ABduction/ADduction: 10 reps;AAROM;AROM    General Comments        Pertinent Vitals/Pain Pain Location: Pt does not rate but c/o both orthopedic pain as well as headache and general  abdominal pain    Home Living                      Prior Function            PT Goals (current goals can now be found in the care plan section) Progress towards PT goals: Progressing toward goals    Frequency  BID    PT Plan Current plan remains appropriate    Co-evaluation             End of Session Equipment Utilized During Treatment: Gait belt Activity Tolerance: Patient limited by pain Patient left: with call bell/phone within reach;with bed alarm set     Time: [id number] PT Time Calculation (min) (ACUTE ONLY): 13 min  Charges:  $Therapeutic Exercise: 8-22 mins                    G Codes:     Wayne Both, PT, DPT [phone]  Kreg Shropshire 08/05/2015, 5:43 PM

## 2015-08-05 NOTE — Evaluation (Signed)
Occupational Therapy Evaluation Patient Details Name: Anita Small MRN: [id number] DOB: 1951/03/21 Today's Date: 08/05/2015    History of Present Illness Anita Small is a 65 y.o. female admitted to hospital on 07/28/15 after sustaining a fall at home with a known history of type 2 diabetes mellitus, GERD, hypertension, hyperlipidemia, diverticulosis had a fall in her bathroom and 1 AM on Thursday morning. Patient was lying on the floor for several hours. Her telephone was in the bedroom and she could not reach it. She did not pass out or lose consciousness. Patient kept screaming and her neighbor responded in the afternoon probably around 3:30 PM and EMS was called and later on patient was brought to the emergency room. When patient fell down she landed on left side of the body hitting the left shoulder and left hip area to the ground. Patient has an ecchymosis and swelling of the left shoulder. She has pain in the left shoulder and left hip. Pain is aching in nature 8 out of 10 on a scale of 1-10. Pain is relieved with an analgesic medication. During the workup in the emergency room imaging study showed left femur intertrochanteric fracture and left humerus fracture. Patient also has elevated troponin. No complaints of chest pain. No history of any shortness of breath. No orthopnea or paroxysmal nocturnal dyspnea. Left Heart Cath and Coronary Angiography (N/A) as a surgical intervention   Clinical Impression   Pt is 65 year old woman s/p L hip fracture and L shoulder fracture with pinning of L hip by Dr Rudene Christians but no surgery for L shoulder planned yet.  Pt refused to wear a sling for support for LUE per NSG.  She had a cardiac cath on 08/01/15 and L hip surgery on 08/04/15.  She lives at home alone and was independent in ADLs using FWW but no longer drives and relies on her son and his girlfriend Anita Small to help with grocery shopping, doctor appointments etc.  She currently requires total assist for  LB dressing and bathing, uses bed pan which required 2 people to help patient bridge since rolling to either side is too painful and L shoulder in non WB.  She is limited in functional ADLs due to pain, decreased ROM, edema and bruising in LUE and LLE.  Pt requires mod assist for set up for self feeding and grooming skills and max assist for UB dressing and bathing skills.  She would benefit from continued skilled OT services for education in assistive devices, functional mobility, and education in recommendations for home modifications to increase safety and prevent falls.  Pt is a good candidate for SNF to continue rehabilitation.       Follow Up Recommendations  SNF    Equipment Recommendations   (rec sock aid, BSC over toilet, LH sponge)    Recommendations for Other Services       Precautions / Restrictions Precautions Precautions: Fall;Shoulder Type of Shoulder Precautions: non weight bearing LUE due to fracture and pt refused sling Restrictions Weight Bearing Restrictions: Yes LUE Weight Bearing: Non weight bearing LLE Weight Bearing: Weight bearing as tolerated Other Position/Activity Restrictions: severe bruising along entire LUE and LLE and very edematous LUE and hand esp back of hand      Mobility Bed Mobility                  Transfers  Balance                                            ADL Overall ADL's : Needs assistance/impaired                                       General ADL Comments: Pt is currently dependent for all LB dressing and bathing skills, requires 2 people to place bed pan and dependent for clean up, mod assist for set up for self feeding and grooming skills due to L shoulder fracture and non WB.       Vision     Perception     Praxis      Pertinent Vitals/Pain Pain Assessment: 0-10 Pain Score: 7  Pain Location: L hip and L shoulder Pain Descriptors / Indicators:  Constant;Aching;Discomfort;Sore;Tender Pain Intervention(s): Limited activity within patient's tolerance;Monitored during session;Premedicated before session;Ice applied     Hand Dominance Right   Extremity/Trunk Assessment Upper Extremity Assessment Upper Extremity Assessment: LUE deficits/detail LUE Deficits / Details: LUE and hand very edematous and painful with bruising and non WB due to shoulder fx and refuses to wear a sling LUE Coordination: decreased fine motor;decreased gross motor   Lower Extremity Assessment Lower Extremity Assessment: Defer to PT evaluation       Communication Communication Communication: No difficulties   Cognition Arousal/Alertness: Awake/alert Behavior During Therapy: WFL for tasks assessed/performed Overall Cognitive Status: Within Functional Limits for tasks assessed       Memory: Decreased short-term memory (pt reports this was 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 along iwth decreased vision in L eye)             General Comments       Exercises       Shoulder Instructions      Home Living Family/patient expects to be discharged to:: Private residence Living Arrangements: Alone Available Help at Discharge: Family;Friend(s) (son's girlfriend Anita Small) Type of Home: Mobile home Home Access: Stairs to enter [employer] of Steps: 8 Entrance Stairs-Rails: Can reach both Home Layout: One level     Bathroom Shower/Tub: Tub/shower unit Shower/tub characteristics: Door Biochemist, clinical: Standard     Home Equipment: Environmental consultant - 2 wheels;Crutches;Adaptive equipment Adaptive Equipment: Reacher        Prior Functioning/Environment Level of Independence: Independent with assistive device(s)        Comments: Pt used FWW for ambulation at home and reports not doing much besides watching TV and going to doctor's appointments    OT Diagnosis: Generalized weakness;Acute pain (non WB and decraesed funciton LUE and hand due to shoulder  fracture with no surgery planned yet)   OT Problem List: Decreased strength;Decreased range of motion;Pain;Decreased coordination;Impaired UE functional use;Decreased activity tolerance   OT Treatment/Interventions: Self-care/ADL training;DME and/or AE instruction;Therapeutic activities;Patient/family education    OT Goals(Current goals can be found in the care plan section) Acute Rehab OT Goals Patient Stated Goal: "to start doing things for myself again" OT Goal Formulation: With patient Time For Goal Achievement: 08/19/15 Potential to Achieve Goals: Good  OT Frequency: Min 1X/week   Barriers to D/C:    lives alone       Co-evaluation              End of Session Equipment Utilized During Treatment:  (  bed pan with NSG) Nurse Communication:  (sling refused by patient per NSG report)  Activity Tolerance: Patient limited by fatigue;Patient limited by pain Patient left: in bed   Time: 1000-1100 OT Time Calculation (min): 60 min Charges:  OT General Charges $OT Visit: 1 Procedure OT Evaluation $OT Eval High Complexity: 1 Procedure OT Treatments $Therapeutic Activity: 38-52 mins G-Codes:    Kordel Leavy 08-12-15, 11:17 AM   Chrys Racer, OTR/L ascom [phone]

## 2015-08-05 NOTE — Anesthesia Postprocedure Evaluation (Signed)
Anesthesia Post Note  Patient: Anita Small  Procedure(s) Performed: Procedure(s) (LRB): INTRAMEDULLARY (IM) NAIL INTERTROCHANTRIC (Left)  Patient location during evaluation: PACU Anesthesia Type: General Level of consciousness: awake and alert Pain management: pain level controlled Vital Signs Assessment: post-procedure vital signs reviewed and stable Respiratory status: spontaneous breathing, nonlabored ventilation, respiratory function stable and patient connected to nasal cannula oxygen Cardiovascular status: blood pressure returned to baseline and stable Postop Assessment: no signs of nausea or vomiting Anesthetic complications: no    Last Vitals:  Filed Vitals:   08/05/15 0807 08/05/15 1105  BP: 129/61 111/62  Pulse: 84 73  Temp: 36.9 C 37.1 C  Resp: 18 18    Last Pain:  Filed Vitals:   08/05/15 1107  PainSc: [place]

## 2015-08-05 NOTE — Evaluation (Signed)
Physical Therapy Evaluation Patient Details Name: Anita Small MRN: [id number] DOB: Mar 19, 1951 Today's Date: 08/05/2015   History of Present Illness  Pt is a 65 y/o female here with L hip and shoulder fx.  She had ORIF of the hip  Clinical Impression  Pt is very limited with her ability to participate with PT.  She normally lives a sedentary life and was not in great shape going into this.  She is now unable to use L shoulder and has significant pain with any movement (active or passive) with both U&LEs on the L.  She is pleasant and willing to try but ultimately very pain limited and weak.  She also has some signficant dizziness with sitting at EOB and is unable to even think about attempting getting standing even with +2 assist.  Pt realizes that going home is not an option.  She made decent effort with bed exercises for 10 minutes apart from PT exam but again was very pain limited.     Follow Up Recommendations SNF    Equipment Recommendations       Recommendations for Other Services       Precautions / Restrictions Precautions Precautions: Fall;Shoulder Type of Shoulder Precautions: non weight bearing LUE due to fracture and pt refused sling Restrictions Weight Bearing Restrictions: Yes LUE Weight Bearing: Non weight bearing LLE Weight Bearing: Weight bearing as tolerated Other Position/Activity Restrictions: severe bruising along entire LUE and LLE and very edematous LUE and hand esp back of hand      Mobility  Bed Mobility Overal bed mobility: +2 for physical assistance             General bed mobility comments: Max assist to get to EOB, pt struggles to even get to upright sitting with +2 assist.  [place] reports some room spinning dizziness (no overt nystagmus).  Transfers                 General transfer comment: unable/unsafe to attempt  Ambulation/Gait                Stairs            Wheelchair Mobility    Modified Rankin (Stroke  Patients Only)       Balance                                             Pertinent Vitals/Pain Pain Assessment: 0-10 Pain Score: 8  Pain Location: L shoulder soreness at rest, pain with any motion, L hip pain with motion as well Pain Descriptors / Indicators: Constant;Aching;Discomfort;Sore;Tender Pain Intervention(s): Limited activity within patient's tolerance;Monitored during session;Premedicated before session;Ice applied    Home Living Family/patient expects to be discharged to:: Private residence Living Arrangements: Alone Available Help at Discharge: Family;Friend(s) Type of Home: Mobile home Home Access: Stairs to enter Entrance Stairs-Rails: Can reach both Entrance Stairs-Number of Steps: 8 Home Layout: One level Home Equipment: Walker - 2 wheels;Crutches;Adaptive equipment      Prior Function Level of Independence: Independent with assistive device(s)         Comments: Pt used FWW for ambulation at home and reports not doing much besides watching TV and going to doctor's appointments.  She states that if she goes out she'll use a stores' cart.     Hand Dominance   Dominant Hand: Right    Extremity/Trunk Assessment  Upper Extremity Assessment: Defer to OT evaluation       LUE Deficits / Details: LUE and hand very edematous and painful with bruising and non WB due to shoulder fx and refuses to wear a sling   Lower Extremity Assessment: LLE deficits/detail   LLE Deficits / Details: R LE grossly 4/5 and functional, L very pain limited with minimal toleratation for PROM and even less with AROM.       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 along iwth decreased vision in L eye)               General Comments      Exercises General Exercises - Lower Extremity Ankle Circles/Pumps: AROM;10 reps Quad Sets: Strengthening;10 reps Gluteal Sets: Strengthening;10 reps Heel Slides: AAROM;5 reps Hip ABduction/ADduction: AAROM;5 reps      Assessment/Plan    PT Assessment Patient needs continued PT services  PT Diagnosis Difficulty walking;Generalized weakness;Acute pain   PT Problem List Decreased strength;Decreased range of motion;Decreased activity tolerance;Decreased balance;Decreased mobility;Decreased coordination;Decreased cognition;Decreased knowledge of use of DME;Decreased safety awareness;Pain  PT Treatment Interventions DME instruction;Gait training;Stair training;Functional mobility training;Therapeutic activities;Therapeutic exercise;Balance training   PT Goals (Current goals can be found in the Care Plan section) Acute Rehab PT Goals Patient Stated Goal: "to start doing things for myself again" PT Goal Formulation: With patient Time For Goal Achievement: 08/19/15 Potential to Achieve Goals: Fair    Frequency BID   Barriers to discharge        Co-evaluation               End of Session Equipment Utilized During Treatment: Gait belt Activity Tolerance: Patient limited by pain Patient left: with call bell/phone within reach;with bed alarm set           Time: [id number] PT Time Calculation (min) (ACUTE ONLY): 34 min   Charges:   PT Evaluation $PT Eval Moderate Complexity: 1 Procedure PT Treatments $Therapeutic Exercise: 8-22 mins   PT G Codes:       Wayne Both, PT, DPT [phone]  Kreg Shropshire 08/05/2015, 12:04 PM

## 2015-08-05 NOTE — Clinical Social Work Placement (Signed)
   CLINICAL SOCIAL WORK PLACEMENT  NOTE  Date:  08/05/2015  Patient Details  Name: Anita Small MRN: [id number] Date of Birth: [birth year]  Clinical Social Work is seeking post-discharge placement for this patient at the [place] level of care (*CSW will initial, date and re-position this form in  chart as items are completed):  Yes   Patient/family provided with [facility] Work Department's list of facilities offering this level of care within the geographic area requested by the patient (or if unable, by the patient's family).  Yes   Patient/family informed of their freedom to choose among providers that offer the needed level of care, that participate in Medicare, Medicaid or managed care program needed by the patient, have an available bed and are willing to accept the patient.  Yes   Patient/family informed of [facility]'s ownership interest in [facility] and [facility], as well as of the fact that they are under no obligation to receive care at these facilities.  PASRR submitted to EDS on 07/29/15 (Patient had an expired PASARR. )     PASRR number received on 07/29/15     Existing PASRR number confirmed on       FL2 transmitted to all facilities in geographic area requested by pt/family on 07/29/15     FL2 transmitted to all facilities within larger geographic area on       Patient informed that his/her managed care company has contracts with or will negotiate with certain facilities, including the following:        Yes   Patient/family informed of bed offers received.  Patient chooses bed at [facility]     Physician recommends and patient chooses bed at      Patient to be transferred to   on  .  Patient to be transferred to facility by       Patient family notified on   of transfer.  Name of family member notified:        PHYSICIAN       Additional Comment:     _______________________________________________ Loralyn Freshwater, LCSW 08/05/2015, 12:32 PM

## 2015-08-05 NOTE — Progress Notes (Signed)
  Subjective: 1 Day Post-Op Procedure(s) (LRB): INTRAMEDULLARY (IM) NAIL INTERTROCHANTRIC (Left) Patient reports pain as 7 on 0-10 scale.   Patient is well, and has had no acute complaints or problems Plan is to go Skilled nursing facility after hospital stay. Negative for chest pain and shortness of breath Fever: no Gastrointestinal:Negative for nausea and vomiting  Objective: Vital signs in last 24 hours: Temp:  [97.3 F (36.3 C)-99.4 F (37.4 C)] 98.8 F (37.1 C) (02/10 0433) Pulse Rate:  [69-146] 82 (02/10 0433) Resp:  [12-21] 18 (02/10 0433) BP: (96-153)/(58-99) 125/85 mmHg (02/10 0433) SpO2:  [92 %-100 %] 94 % (02/10 0433)  Intake/Output from previous day:  Intake/Output Summary (Last 24 hours) at 08/05/15 0746 Last data filed at 08/05/15 0530  Gross per 24 hour  Intake    800 ml  Output   4400 ml  Net  -3600 ml    Intake/Output this shift:    Labs:  Recent Labs  08/04/15 1832 08/05/15 0543  HGB 9.8* 9.1*    Recent Labs  08/04/15 1832 08/05/15 0543  WBC 11.2* 7.0  RBC 3.49* 3.23*  HCT 30.6* 27.9*  PLT 394 339    Recent Labs  08/04/15 1832 08/05/15 0543  NA  --  137  K  --  3.5  CL  --  100*  CO2  --  29  BUN  --  11  CREATININE 0.76 0.74  GLUCOSE  --  256*  CALCIUM  --  8.1*   No results for input(s): LABPT, INR in the last 72 hours.   EXAM General - Patient is Alert, Appropriate and Oriented Extremity - Neurologically intact ABD soft Sensation intact distally Intact pulses distally Dorsiflexion/Plantar flexion intact Incision: dressing C/D/I Dressing/Incision - clean, dry, no drainage Motor Function - intact, moving foot and toes well on exam.   Severe ecchymosis along the patient's left side due to history of a fall and landing on that side.  Past Medical History  Diagnosis Date  . Depression     Paranoid tendencies  . Diabetes mellitus     Type II  . GERD (gastroesophageal reflux disease)   . Hypertension   . Anxiety    . Arthritis     Left knee  . Adenomatous polyp 1999  . Hyperlipidemia   . Hypothyroidism   . Obesity   . Diverticulosis   . CAD (coronary artery disease) of artery bypass graft     a. NSTEMI in 07/2015: Cath showed 99% stenosis of distal LCx  --> DES placed    Assessment/Plan: 1 Day Post-Op Procedure(s) (LRB): INTRAMEDULLARY (IM) NAIL INTERTROCHANTRIC (Left) Principal Problem:   Femur fracture, left (HCC) Active Problems:   Hypothyroidism   Type 2 diabetes mellitus ([place])   Essential hypertension   GERD   HLD (hyperlipidemia)   Fracture of left humerus   Elevated troponin   Fall   NSTEMI (non-ST elevated myocardial infarction) ([place])  Estimated body mass index is 50.3 kg/(m^2) as calculated from the following:   Height as of this encounter: 5' 2.01" (1.575 m).   Weight as of this encounter: 124.785 kg (275 lb 1.6 oz). Advance diet Up with therapy   Pt will need to have BM prior to discharge. Up with therapy Care management to assist with discharge.  DVT Prophylaxis - Lovenox, Foot Pumps, TED hose and Plavix Weight-Bearing as tolerated to left leg  J. Cameron Proud, PA-C [facility] Orthopaedic Surgery 08/05/2015, 7:46 AM

## 2015-08-05 NOTE — Care Management Important Message (Signed)
Important Message  Patient Details  Name: Anita Small MRN: [id number] Date of Birth: [birth year]   Medicare Important Message Given:  Yes    Juliann Pulse A Lamark Schue 08/05/2015, 9:51 AM

## 2015-08-05 NOTE — Progress Notes (Signed)
[facility] [place] NAME: Anita Small    MR#:  [id number]  DATE OF BIRTH:  1950-07-07  SUBJECTIVE:  CHIEF COMPLAINT:   Chief Complaint  Patient presents with  . Fall   - admitted after a fall, noted to have left hip fracture and also left shoulder fracture.  -Also had NSTEMI. Cardiac catheterization revealed 99% left circumflex blockage and patient had a stent put in on 08/01/2015 -s/p left hip surgery ad POD #1 today, complains of pain.  REVIEW OF SYSTEMS:  Review of Systems  Constitutional: Positive for malaise/fatigue. Negative for fever and chills.  HENT: Negative for ear discharge, ear pain and tinnitus.   Respiratory: Negative for cough, shortness of breath and wheezing.   Cardiovascular: Negative for chest pain, palpitations and leg swelling.  Gastrointestinal: Negative for nausea, vomiting, abdominal pain, diarrhea and constipation.  Genitourinary: Negative for dysuria.  Musculoskeletal: Positive for myalgias and joint pain.       Left shoulder and also left hip pain  Neurological: Negative for dizziness, sensory change, speech change, focal weakness, seizures and headaches.  Psychiatric/Behavioral: Negative for depression.    DRUG ALLERGIES:   Allergies  Allergen Reactions  . Codeine Phosphate     REACTION: unspecified  . Duloxetine     REACTION: unspecified  . Meperidine Hcl     REACTION: unspecified  . Norco [Hydrocodone-Acetaminophen] Other (See Comments)    hallucinations  . Venlafaxine     REACTION: unspecified  . Doxycycline Rash    VITALS:  Blood pressure 111/62, pulse 73, temperature 98.7 F (37.1 C), temperature source Oral, resp. rate 18, height 5' 2.01" (1.575 m), weight 124.785 kg (275 lb 1.6 oz), SpO2 100 %.  PHYSICAL EXAMINATION:  Physical Exam  GENERAL:  65 y.o.-year-old patient lying in the bed with no acute distress.  EYES: Pupils equal, round, reactive to light and accommodation.  No scleral icterus. Extraocular muscles intact.  HEENT: Head atraumatic, normocephalic. Oropharynx and nasopharynx clear.  NECK:  Supple, no jugular venous distention. No thyroid enlargement, no tenderness.  LUNGS: Normal breath sounds bilaterally, no wheezing, rales,rhonchi or crepitation. No use of accessory muscles of respiration.  Decreased bibasilar breath sounds CARDIOVASCULAR: S1, S2 normal. No murmurs, rubs, or gallops.  ABDOMEN: Soft, nontender, nondistended. Bowel sounds present. No organomegaly or mass.  EXTREMITIES: No cyanosis, or clubbing. Left hip pain Left arm and shoulder region with significant bruising all over NEUROLOGIC: Cranial nerves II through XII are intact. Muscle strength 5/5 in all extremities except left arm and leg limited due to pain from fractures. Sensation intact. Gait not checked.  PSYCHIATRIC: The patient is alert and oriented x 3.  SKIN: No obvious rash, lesion, or ulcer.    LABORATORY PANEL:   CBC  Recent Labs Lab 08/05/15 0543  WBC 7.0  HGB 9.1*  HCT 27.9*  PLT 339   ------------------------------------------------------------------------------------------------------------------  Chemistries   Recent Labs Lab 08/01/15 0442  08/05/15 0543  NA  --   < > 137  K  --   < > 3.5  CL  --   < > 100*  CO2  --   < > 29  GLUCOSE  --   < > 256*  BUN  --   < > 11  CREATININE 0.87  < > 0.74  CALCIUM  --   < > 8.1*  MG 2.0  --   --   < > = values in this interval not displayed. ------------------------------------------------------------------------------------------------------------------  Cardiac Enzymes  Recent Labs Lab 08/01/15 0442  TROPONINI 1.55*   ------------------------------------------------------------------------------------------------------------------  RADIOLOGY:  Dg C-arm 1-60 Min  08/04/2015  CLINICAL DATA:  Femur fracture. EXAM: DG C-ARM 61-120 MIN COMPARISON:  None. FINDINGS: Spot films were obtained documenting  placement of compression screw and intramedullary rod across a intertrochanteric fracture. IMPRESSION: As above. Electronically Signed   By: Staci Righter M.D.   On: 08/04/2015 16:01   Dg Femur Min 2 Views Left  08/04/2015  CLINICAL DATA:  Elective surgery. EXAM: LEFT FEMUR 2 VIEWS COMPARISON:  None. FINDINGS: There has been a placement of left intra medullary rod with screw fixation proximally and distally. The alignment is anatomic. Poorly visualized irregularity of the supra trochanteric femoral neck may represent a fracture. There is no evidence of immediate complications. IMPRESSION: Satisfactory alignment of left femoral intra medullary rod and screw fixation. Electronically Signed   By: Fidela Salisbury M.D.   On: 08/04/2015 16:02    EKG:   Orders placed or performed during the hospital encounter of 07/28/15  . EKG 12-Lead  . EKG 12-Lead  . EKG 12-Lead  . EKG 12-Lead  . EKG 12-Lead  . EKG 12-Lead  . EKG 12-Lead immediately post procedure  . EKG 12-Lead  . EKG 12-Lead immediately post procedure  . EKG 12-Lead  . EKG 12-Lead  . EKG 12-Lead    ASSESSMENT AND PLAN:   65 year old female with past medical history significant for hypertension, hyperlipidemia, type 2 diabetes mellitus presented to the hospital after a fall. She was also noted to have elevated troponin and NSTEMI on admission along with left shoulder and left hip fractures.  #1 NSTEMI-appreciate cardiology consult. Patient had cardiac catheter done on 08/01/2015 showing 99% distal left circumflex stenosis and a 70% mid RCA stenosis. She had drug eluting stent put in the left circumflex. -Continue dual antiplatelet treatment with aspirin and Plavix. Also on statin  #2 community-acquired pneumonia- . -sputum cultures growing Pseudomonas. on Cipro - change to oral today. -Continue oxygen as needed.  #3 left hip intertrochanteric fracture- ORIF POD #1 today -Appreciate orthopedic consult.  -Postsurgical DVT  prophylaxis and pain management and physical therapy consult. - physical therapy consulted  #4 left humeral head fracture-nondisplaced acute left humeral head and neck fracture. With significant bruising. -Likely will need of sling placement -Possible surgery when stable and recovered from her other acute events. -Physical therapy consult and pain management  #5 diabetes mellitus-on sliding scale insulin, Tradjenta Metformin on hold due to recent cardiac catheterization Also Invokana on hold  #6 hypertension-currently on metoprolol. Home lisinopril and hydrochlorothiazide are on hold. Monitor and add medications as needed  #7 DVT Prophylaxis- lovenox   Physical therapy recommended rehab- for SNF Monday if stable.  All the records are reviewed and case discussed with Care Management/Social Workerr. Management plans discussed with the patient, family and they are in agreement.  CODE STATUS: Full Code  TOTAL TIME TAKING CARE OF THIS PATIENT: 40 minutes.   POSSIBLE D/C IN 3 DAYS, DEPENDING ON CLINICAL CONDITION.   Gladstone Lighter M.D on 08/05/2015 at 2:12 PM  Between 7am to 6pm - Pager - [phone]  After 6pm go to [email or web address] - password [email or web address] [facility]  Office  [phone]  CC: Primary care physician; Lenard Simmer, MD

## 2015-08-05 NOTE — Progress Notes (Signed)
Nutrition Follow-up   INTERVENTION:   Meals and Snacks: Cater to patient preferences Medical Food Supplement Therapy: will send Sugar Free Mighty Shakes TID for added nutrition   NUTRITION DIAGNOSIS:   Inadequate oral intake related to acute illness as evidenced by per patient/family report, meal completion < 50%.  GOAL:   Patient will meet greater than or equal to 90% of their needs; ongoing  MONITOR:    (Enrgy Intake, Electrolyte and renal Profile, Anthropometrics, glucose Profile)  REASON FOR ASSESSMENT:   Consult Hip fracture protocol  ASSESSMENT:   Pt admitted s/p fall with left femur fracture, surgical intervention cancelled for today secondary to cardiology recommendations.  Per MD note pt with left femur fracture as well as left shoulder fracture; POD1 left hip surgery.  Pt also s/p NSTEMI, s/p cardiac cath with stent placement on 08/01/2015.  Diet Order:  Diet Carb Modified Fluid consistency:: Thin; Room service appropriate?: Yes    Current Nutrition: Pt ate only jello and fruit cup today at lunch. Pt reports no appetite currently. Recorded po intake 20-40% on 2/8, Pt NPO for multiple procedures during admission.    Gastrointestinal Profile: Last BM: 08/05/2015   Scheduled Medications:  . aspirin  81 mg Oral Daily  . atorvastatin  40 mg Oral q1800  . ciprofloxacin  500 mg Oral BID  . citalopram  40 mg Oral Daily  . clopidogrel  75 mg Oral Q breakfast  . docusate sodium  100 mg Oral BID  . enoxaparin (LOVENOX) injection  40 mg Subcutaneous Q12H  . fenofibrate  160 mg Oral Daily  . guaiFENesin  600 mg Oral BID  . insulin aspart  0-20 Units Subcutaneous TID WC  . insulin aspart  0-5 Units Subcutaneous QHS  . levothyroxine  100 mcg Oral QAC breakfast  . linagliptin  5 mg Oral Daily  . metoprolol tartrate  50 mg Oral BID  . sodium chloride flush  3 mL Intravenous Q12H  . sodium chloride flush  3 mL Intravenous Q12H    Continuous Medications:  . sodium  chloride 75 mL/hr at 08/04/15 1822     Electrolyte/Renal Profile and Glucose Profile:   Recent Labs Lab 07/30/15 0837 08/01/15 0442 08/02/15 0531 08/04/15 1832 08/05/15 0543  NA 139  --  138  --  137  K 4.1  --  3.7  --  3.5  CL 104  --  102  --  100*  CO2 24  --  26  --  29  BUN 20  --  20  --  11  CREATININE 0.85 0.87 0.82 0.76 0.74  CALCIUM 8.3*  --  8.2*  --  8.1*  MG  --  2.0  --   --   --   GLUCOSE 241*  --  182*  --  256*   Protein Profile: No results for input(s): ALBUMIN in the last 168 hours.   Nutrition-Focused Physical Exam Findings:  Unable to complete Nutrition-Focused physical exam at this time.    Weight Trend since Admission: Filed Weights   07/29/15 0242 08/01/15 1402  Weight: 262 lb 3.2 oz (118.933 kg) 275 lb 1.6 oz (124.785 kg)    Ideal Body Weight:   50kg  BMI:  Body mass index is 50.3 kg/(m^2).  Estimated Nutritional Needs:   Kcal:  using IBW of 50kg, BEE: 994kcals, TEE: (IF 1.1-1.3)(AF 1.3) 1420-1678kcals  Protein:  55-65g protein (1.1-1.3g/kg)  Fluid:  1250-1580mL of fluid (25-2mL/kg)  EDUCATION NEEDS:   No education needs  identified at this time   Haines, RD, LDN Pager [phone] Weekend/On-Call Pager [phone]

## 2015-08-06 LAB — GLUCOSE, CAPILLARY
Glucose-Capillary: 218 mg/dL — ABNORMAL HIGH (ref 65–99)
Glucose-Capillary: 280 mg/dL — ABNORMAL HIGH (ref 65–99)
Glucose-Capillary: 242 mg/dL — ABNORMAL HIGH (ref 65–99)

## 2015-08-06 LAB — BASIC METABOLIC PANEL
Anion gap: 9 (ref 5–15)
BUN: 13 mg/dL (ref 6–20)
CALCIUM: 8.4 mg/dL — AB (ref 8.9–10.3)
CO2: 29 mmol/L (ref 22–32)
CREATININE: 0.84 mg/dL (ref 0.44–1.00)
Chloride: 101 mmol/L (ref 101–111)
GFR calc Af Amer: >60 mL/min (ref >60)
GLUCOSE: 233 mg/dL — AB (ref 65–99)
Potassium: 3.6 mmol/L (ref 3.5–5.1)
Sodium: 139 mmol/L (ref 135–145)

## 2015-08-06 LAB — CBC
HCT: 28 % — ABNORMAL LOW (ref 35.0–47.0)
Hemoglobin: 9.1 g/dL — ABNORMAL LOW (ref 12.0–16.0)
MCH: 28.1 pg (ref 26.0–34.0)
MCHC: 32.3 g/dL (ref 32.0–36.0)
MCV: 87 fL (ref 80.0–100.0)
PLATELETS: 358 10*3/uL (ref 150–440)
RBC: 3.22 MIL/uL — ABNORMAL LOW (ref 3.80–5.20)
RDW: 15.9 % — AB (ref 11.5–14.5)
WBC: 6.6 10*3/uL (ref 3.6–11.0)

## 2015-08-06 NOTE — Progress Notes (Signed)
Physical Therapy Treatment Patient Details Name: Anita Small MRN: [id number] DOB: September 17, 1950 Today's Date: 08/06/2015    History of Present Illness Pt is a 65 y/o female here with L hip and shoulder fx.  She had ORIF of the hip    PT Comments    Pt pleasant t/o session and seems to genuinely want to work with PT, but is very limited due to a number of issues.  Pt shows good effort with exercises but is pain limited, weak and does not tolerate a lot of ROM in the L knee.  She becomes very dizzy when assisted to sitting at EOB (to the point of needing bucket secondary to feelings of needing to vomit).  After some time to calm down we attempted standing but pt was too weak, too unsteady, too anxious and too pain limited to even get to fully upright despite heavy PT assist and cuing.  Pt not able to use HW appropriate to shift weight to her strong side and with L shoulder pain and being in a sling she is very difficult to keep safe in upright given her other limitations.   Follow Up Recommendations  SNF     Equipment Recommendations       Recommendations for Other Services       Precautions / Restrictions Precautions Precautions: Fall;Shoulder Restrictions Weight Bearing Restrictions: Yes LUE Weight Bearing: Non weight bearing LLE Weight Bearing: Weight bearing as tolerated    Mobility  Bed Mobility Overal bed mobility: Needs Assistance Bed Mobility: Supine to Sit;Sit to Supine     Supine to sit: Max assist Sit to supine: Max assist   General bed mobility comments: Pt is able to show some assist getting to EOB but remains very limited and becomes extremely dizzy getting to sitting position.  She initially felt as though she was going to vomit and described vertigo-like symptoms (she did not seem to have any nystagmus and symptoms lasted longer than trypical canalith vertigo issues). Pt needing very heavy assist to get back to bed.   Transfers Overall transfer level:  Needs assistance Equipment used: Hemi-walker Transfers: Sit to/from Stand Sit to Stand: Max assist         General transfer comment: Attempted to get to standing with heavy assist and hemiwalker.  Pt shows good effort but is unable to lift enough to get to standing even with max assist.  She appeared to have some panic with the effort and despite great intentions to try to get to standing she is simply unsafe and unable to get to standing much less to the recliner.    Ambulation/Gait             General Gait Details: unable/unsafe   Financial trader Rankin (Stroke Patients Only)       Balance                                    Cognition Arousal/Alertness: Awake/alert Behavior During Therapy: Restless Overall Cognitive Status: Within Functional Limits for tasks assessed                      Exercises General Exercises - Lower Extremity Ankle Circles/Pumps: AROM;10 reps Quad Sets: Strengthening;10 reps Gluteal Sets: Strengthening;10 reps Short Arc Quad: 10 reps;AROM Heel Slides: AAROM;10 reps (tolerates little knee  flex with this ex (h/o difficult TKA)) Hip ABduction/ADduction: 10 reps;AAROM;AROM    General Comments        Pertinent Vitals/Pain Pain Location: not rated today, though pt is less responsive to pain with movements as she was yesterday    Home Living                      Prior Function            PT Goals (current goals can now be found in the care plan section) Progress towards PT goals: Progressing toward goals (slow progress, )    Frequency  BID    PT Plan Current plan remains appropriate    Co-evaluation             End of Session Equipment Utilized During Treatment: Gait belt Activity Tolerance: Patient limited by pain (dizziness, weakness, anxious with standing) Patient left: with bed alarm set;with call bell/phone within reach     Time:  0919-1002 PT Time Calculation (min) (ACUTE ONLY): 43 min  Charges:  $Therapeutic Exercise: 23-37 mins $Therapeutic Activity: 8-22 mins                    G Codes:     Wayne Both, PT, DPT [phone]  Kreg Shropshire 08/06/2015, 11:13 AM

## 2015-08-06 NOTE — Progress Notes (Signed)
[place] [place] NAME: Anita Small    MR#:  [id number]  DATE OF BIRTH:  Nov 08, 1950  SUBJECTIVE:  CHIEF COMPLAINT:   Chief Complaint  Patient presents with  . Fall   - admitted after a fall, noted to have left hip fracture and also left shoulder fracture.  -Also had NSTEMI. Cardiac catheterization revealed 99% left circumflex blockage and patient had a stent put in on 08/01/2015 -s/p left hip surgery ad POD # 2 today, complains of pain. No diarrhea today -Complains of significant pain to work with physical therapy.  REVIEW OF SYSTEMS:  Review of Systems  Constitutional: Positive for malaise/fatigue. Negative for fever and chills.  HENT: Negative for ear discharge, ear pain and tinnitus.   Respiratory: Negative for cough, shortness of breath and wheezing.   Cardiovascular: Negative for chest pain, palpitations and leg swelling.  Gastrointestinal: Negative for nausea, vomiting, abdominal pain, diarrhea and constipation.  Genitourinary: Negative for dysuria.  Musculoskeletal: Positive for myalgias and joint pain.       Left shoulder and also left hip pain  Neurological: Negative for dizziness, sensory change, speech change, focal weakness, seizures and headaches.  Psychiatric/Behavioral: Negative for depression.    DRUG ALLERGIES:   Allergies  Allergen Reactions  . Codeine Phosphate     REACTION: unspecified  . Duloxetine     REACTION: unspecified  . Meperidine Hcl     REACTION: unspecified  . Norco [Hydrocodone-Acetaminophen] Other (See Comments)    hallucinations  . Venlafaxine     REACTION: unspecified  . Doxycycline Rash    VITALS:  Blood pressure 117/60, pulse 71, temperature 98 F (36.7 C), temperature source Oral, resp. rate 16, height 5' 2.01" (1.575 m), weight 124.785 kg (275 lb 1.6 oz), SpO2 98 %.  PHYSICAL EXAMINATION:  Physical Exam  GENERAL:  65 y.o.-year-old patient lying in the bed with no  acute distress.  EYES: Pupils equal, round, reactive to light and accommodation. No scleral icterus. Extraocular muscles intact.  HEENT: Head atraumatic, normocephalic. Oropharynx and nasopharynx clear.  NECK:  Supple, no jugular venous distention. No thyroid enlargement, no tenderness.  LUNGS: Normal breath sounds bilaterally, no wheezing, rales,rhonchi or crepitation. No use of accessory muscles of respiration.  Decreased bibasilar breath sounds CARDIOVASCULAR: S1, S2 normal. No murmurs, rubs, or gallops.  ABDOMEN: Soft, nontender, nondistended. Bowel sounds present. No organomegaly or mass.  EXTREMITIES: No cyanosis, or clubbing. Left hip pain Left arm and shoulder region with significant bruising all over-left arm in sling today NEUROLOGIC: Cranial nerves II through XII are intact. Muscle strength 5/5 in all extremities except left arm and leg limited due to pain from fractures. Sensation intact. Gait not checked.  PSYCHIATRIC: The patient is alert and oriented x 3.  SKIN: No obvious rash, lesion, or ulcer.    LABORATORY PANEL:   CBC  Recent Labs Lab 08/06/15 0507  WBC 6.6  HGB 9.1*  HCT 28.0*  PLT 358   ------------------------------------------------------------------------------------------------------------------  Chemistries   Recent Labs Lab 08/01/15 0442  08/06/15 0507  NA  --   < > 139  K  --   < > 3.6  CL  --   < > 101  CO2  --   < > 29  GLUCOSE  --   < > 233*  BUN  --   < > 13  CREATININE 0.87  < > 0.84  CALCIUM  --   < > 8.4*  MG 2.0  --   --   < > =  values in this interval not displayed. ------------------------------------------------------------------------------------------------------------------  Cardiac Enzymes  Recent Labs Lab 08/01/15 0442  TROPONINI 1.55*   ------------------------------------------------------------------------------------------------------------------  RADIOLOGY:  Dg C-arm 1-60 Min  08/04/2015  CLINICAL DATA:  Femur  fracture. EXAM: DG C-ARM 61-120 MIN COMPARISON:  None. FINDINGS: Spot films were obtained documenting placement of compression screw and intramedullary rod across a intertrochanteric fracture. IMPRESSION: As above. Electronically Signed   By: Staci Righter M.D.   On: 08/04/2015 16:01   Dg Femur Min 2 Views Left  08/04/2015  CLINICAL DATA:  Elective surgery. EXAM: LEFT FEMUR 2 VIEWS COMPARISON:  None. FINDINGS: There has been a placement of left intra medullary rod with screw fixation proximally and distally. The alignment is anatomic. Poorly visualized irregularity of the supra trochanteric femoral neck may represent a fracture. There is no evidence of immediate complications. IMPRESSION: Satisfactory alignment of left femoral intra medullary rod and screw fixation. Electronically Signed   By: Fidela Salisbury M.D.   On: 08/04/2015 16:02    EKG:   Orders placed or performed during the hospital encounter of 07/28/15  . EKG 12-Lead  . EKG 12-Lead  . EKG 12-Lead  . EKG 12-Lead  . EKG 12-Lead  . EKG 12-Lead  . EKG 12-Lead immediately post procedure  . EKG 12-Lead  . EKG 12-Lead immediately post procedure  . EKG 12-Lead  . EKG 12-Lead  . EKG 12-Lead    ASSESSMENT AND PLAN:   65 year old female with past medical history significant for hypertension, hyperlipidemia, type 2 diabetes mellitus presented to the hospital after a fall. She was also noted to have elevated troponin and NSTEMI on admission along with left shoulder and left hip fractures.  #1 NSTEMI-appreciate cardiology consult. Patient had cardiac catheter done on 08/01/2015 showing 99% distal left circumflex stenosis and a 70% mid RCA stenosis. She had drug eluting stent put in the left circumflex. -Continue dual antiplatelet treatment with aspirin and Plavix. Also on statin  #2 community-acquired pneumonia- . -sputum cultures growing Pseudomonas. on Cipro - finish 10 day course -Continue oxygen as needed.  #3 left hip  intertrochanteric fracture- ORIF POD # 2 today -Appreciate orthopedic consult.  -Postsurgical DVT prophylaxis and pain management and physical therapy consult. - physical therapy consulted  #4 left humeral head fracture-nondisplaced acute left humeral head and neck fracture. With significant bruising. -in a sling today -Possible surgery when stable and recovered from her other acute events. -Physical therapy consult and pain management  #5 diabetes mellitus-on sliding scale insulin, Tradjenta Metformin on hold due to recent cardiac catheterization Also Invokana on hold  #6 hypertension-currently on metoprolol. Home lisinopril and hydrochlorothiazide are on hold. Monitor and add medications as needed  #7 DVT Prophylaxis- lovenox   Physical therapy recommended rehab- for SNF Monday if stable.  All the records are reviewed and case discussed with Care Management/Social Workerr. Management plans discussed with the patient, family and they are in agreement.  CODE STATUS: Full Code  TOTAL TIME TAKING CARE OF THIS PATIENT: 40 minutes.   POSSIBLE D/C IN 2 DAYS, DEPENDING ON CLINICAL CONDITION.   Gladstone Lighter M.D on 08/06/2015 at 11:59 AM  Between 7am to 6pm - Pager - [phone]  After 6pm go to [email or web address] - password [email or web address] [facility]  Office  [phone]  CC: Primary care physician; Lenard Simmer, MD

## 2015-08-06 NOTE — Progress Notes (Signed)
Physical Therapy Treatment Patient Details Name: Anita Small MRN: [id number] DOB: 20-Jul-1950 Today's Date: 08/06/2015    History of Present Illness Pt is a 65 y/o female here with L hip and shoulder fx.  She had ORIF of the hip    PT Comments    Pt continues to be anxious and limited with mobility.  She shows good effort with rolling over to get sling under her for lift (to get to recliner) and does not have dizziness this afternoon when getting into sitting position, but ultimately she is unsafe and unable to get to standing or try to take any steps.  Pt does supsisingly well with little relative pain during lift transfer.  Pt will need lift to get back to bed.   Follow Up Recommendations  SNF     Equipment Recommendations       Recommendations for Other Services       Precautions / Restrictions Precautions Precautions: Fall;Shoulder Type of Shoulder Precautions: NWBing secondary to fracture, pt is sling Restrictions LUE Weight Bearing: Non weight bearing LLE Weight Bearing: Weight bearing as tolerated    Mobility  Bed Mobility Overal bed mobility: Needs Assistance Bed Mobility: Rolling Rolling: Mod assist         General bed mobility comments: Pt able to assist with rolling using both the trapeze and the bed rails.  Pt better able to roll to the R secondary to L shoulder pain but is limited with both.   Transfers Overall transfer level: Needs assistance     Sit to Stand: Total assist            Ambulation/Gait                 Stairs            Wheelchair Mobility    Modified Rankin (Stroke Patients Only)       Balance                                    Cognition Arousal/Alertness: Awake/alert Behavior During Therapy: Anxious Overall Cognitive Status: Within Functional Limits for tasks assessed                      Exercises General Exercises - Lower Extremity Ankle Circles/Pumps: AROM;10  reps Quad Sets: Strengthening;10 reps Long Arc Quad: 10 reps;Both;Strengthening Hip ABduction/ADduction: 10 reps;AAROM;AROM Hip Flexion/Marching: 10 reps;AROM    General Comments        Pertinent Vitals/Pain Pain Location: Pt reports laying in bed she is relatively comfortable with minimal pain, has increased pain with any movement of L arm or leg    Home Living                      Prior Function            PT Goals (current goals can now be found in the care plan section) Progress towards PT goals: Progressing toward goals    Frequency  BID    PT Plan Current plan remains appropriate    Co-evaluation             End of Session Equipment Utilized During Treatment: Gait belt Activity Tolerance: Patient limited by pain Patient left: with chair alarm set;with call bell/phone within reach     Time: 1427-1459 PT Time Calculation (min) (ACUTE ONLY): 32 min  Charges:  $Therapeutic Exercise:  8-22 mins $Therapeutic Activity: 8-22 mins                    G Codes:     Wayne Both, PT, DPT [phone]  Kreg Shropshire 08/06/2015, 5:03 PM

## 2015-08-06 NOTE — Progress Notes (Signed)
Subjective: 2 Days Post-Op Procedure(s) (LRB): INTRAMEDULLARY (IM) NAIL INTERTROCHANTRIC (Left)  Non-displaced left proximal humerus fracture. Patient reports pain as 5 on 0-10 scale.   Patient is well, and has had no acute complaints or problems Plan is to go Skilled nursing facility after hospital stay. Negative for chest pain and shortness of breath Fever: no Gastrointestinal:Negative for nausea and vomiting  Objective: Vital signs in last 24 hours: Temp:  [97.3 F (36.3 C)-99.1 F (37.3 C)] 98.6 F (37 C) (02/11 0747) Pulse Rate:  [73-87] 87 (02/11 0747) Resp:  [17-20] 17 (02/11 0747) BP: (111-144)/(56-74) 144/64 mmHg (02/11 0747) SpO2:  [97 %-100 %] 98 % (02/11 0747)  Intake/Output from previous day:  Intake/Output Summary (Last 24 hours) at 08/06/15 0904 Last data filed at 08/06/15 [id number]  Gross per 24 hour  Intake    240 ml  Output    550 ml  Net   -310 ml    Intake/Output this shift:    Labs:  Recent Labs  08/04/15 1832 08/05/15 0543 08/06/15 0507  HGB 9.8* 9.1* 9.1*    Recent Labs  08/05/15 0543 08/06/15 0507  WBC 7.0 6.6  RBC 3.23* 3.22*  HCT 27.9* 28.0*  PLT 339 358    Recent Labs  08/05/15 0543 08/06/15 0507  NA 137 139  K 3.5 3.6  CL 100* 101  CO2 29 29  BUN 11 13  CREATININE 0.74 0.84  GLUCOSE 256* 233*  CALCIUM 8.1* 8.4*   No results for input(s): LABPT, INR in the last 72 hours.   EXAM General - Patient is Alert, Appropriate and Oriented Extremity - Neurologically intact ABD soft Sensation intact distally Intact pulses distally Dorsiflexion/Plantar flexion intact Incision: dressing C/D/I  Severe ecchymosis along the patient's left side due to history of fall. Pt is intact to light touch to the left upper extremity, able to make a full composite fist.  Cap refill to each finger is normal. Sling in place. Dressing/Incision - clean, dry, no drainage Motor Function - intact, moving foot and toes well on exam.   Severe  ecchymosis along the patient's left side due to history of a fall and landing on that side.  Past Medical History  Diagnosis Date  . Depression     Paranoid tendencies  . Diabetes mellitus     Type II  . GERD (gastroesophageal reflux disease)   . Hypertension   . Anxiety   . Arthritis     Left knee  . Adenomatous polyp 1999  . Hyperlipidemia   . Hypothyroidism   . Obesity   . Diverticulosis   . CAD (coronary artery disease) of artery bypass graft     a. NSTEMI in 07/2015: Cath showed 99% stenosis of distal LCx  --> DES placed    Assessment/Plan: 2 Days Post-Op Procedure(s) (LRB): INTRAMEDULLARY (IM) NAIL INTERTROCHANTRIC (Left) Principal Problem:   Femur fracture, left (HCC) Active Problems:   Hypothyroidism   Type 2 diabetes mellitus ([place])   Essential hypertension   GERD   HLD (hyperlipidemia)   Fracture of left humerus   Elevated troponin   Fall   NSTEMI (non-ST elevated myocardial infarction) ([place])  Estimated body mass index is 50.3 kg/(m^2) as calculated from the following:   Height as of this encounter: 5' 2.01" (1.575 m).   Weight as of this encounter: 124.785 kg (275 lb 1.6 oz). Advance diet Up with therapy   Pt has had a BM. Left shoulder sling in place, spoke with nurse about obtaining a  shoulder immobilizer.  Due to patient's size this may not be feasable, if so she can continue with the left shoulder sling. Up with therapy Care management to assist with discharge.  DVT Prophylaxis - Lovenox, Foot Pumps, TED hose and Plavix Weight-Bearing as tolerated to left leg and non-weight bearing to the left upper extremity.  Raquel James, PA-C [facility] 08/06/2015, 9:04 AM

## 2015-08-07 ENCOUNTER — Encounter: Payer: Self-pay | Admitting: Orthopedic Surgery

## 2015-08-07 LAB — GLUCOSE, CAPILLARY
GLUCOSE-CAPILLARY: 183 mg/dL — AB (ref 65–99)
Glucose-Capillary: 233 mg/dL — ABNORMAL HIGH (ref 65–99)
Glucose-Capillary: 312 mg/dL — ABNORMAL HIGH (ref 65–99)
Glucose-Capillary: 185 mg/dL — ABNORMAL HIGH (ref 65–99)
Glucose-Capillary: 219 mg/dL — ABNORMAL HIGH (ref 65–99)

## 2015-08-07 MED ORDER — METFORMIN HCL 500 MG PO TABS
1000.0000 mg | ORAL_TABLET | Freq: Two times a day (BID) | ORAL | Status: DC
  Administered 2015-08-07 – 2015-08-08 (×3): 1000 mg via ORAL
  Filled 2015-08-07 (×3): qty 2

## 2015-08-07 NOTE — Progress Notes (Signed)
Subjective: 3 Days Post-Op Procedure(s) (LRB): INTRAMEDULLARY (IM) NAIL INTERTROCHANTRIC (Left)  Non-displaced left proximal humerus fracture. Patient reports pain as 6 on 0-10 scale.   Patient is well, and has had no acute complaints or problems Plan is to go Skilled nursing facility after hospital stay. Negative for chest pain and shortness of breath Fever: no Gastrointestinal:Negative for nausea and vomiting  Objective: Vital signs in last 24 hours: Temp:  [97.5 F (36.4 C)-98.7 F (37.1 C)] 98.7 F (37.1 C) (02/12 0744) Pulse Rate:  [71-88] 82 (02/12 0744) Resp:  [16-18] 16 (02/12 0744) BP: (114-132)/(53-72) 125/58 mmHg (02/12 0744) SpO2:  [89 %-100 %] 99 % (02/12 0744)  Intake/Output from previous day:  Intake/Output Summary (Last 24 hours) at 08/07/15 0845 Last data filed at 08/06/15 1949  Gross per 24 hour  Intake    600 ml  Output      0 ml  Net    600 ml    Intake/Output this shift:    Labs:  Recent Labs  08/04/15 1832 08/05/15 0543 08/06/15 0507  HGB 9.8* 9.1* 9.1*    Recent Labs  08/05/15 0543 08/06/15 0507  WBC 7.0 6.6  RBC 3.23* 3.22*  HCT 27.9* 28.0*  PLT 339 358    Recent Labs  08/05/15 0543 08/06/15 0507  NA 137 139  K 3.5 3.6  CL 100* 101  CO2 29 29  BUN 11 13  CREATININE 0.74 0.84  GLUCOSE 256* 233*  CALCIUM 8.1* 8.4*   No results for input(s): LABPT, INR in the last 72 hours.   EXAM General - Patient is Alert, Appropriate and Oriented Extremity - Neurologically intact ABD soft Sensation intact distally Intact pulses distally Dorsiflexion/Plantar flexion intact Incision: dressing C/D/I  Severe ecchymosis along the patient's left side due to history of fall. Pt is intact to light touch to the left upper extremity, able to make a full composite fist.  Cap refill to each finger is normal. Sling in place properly. Dressing/Incision - clean, dry, no drainage Motor Function - intact, moving foot and toes well on exam.    Severe ecchymosis along the patient's left side due to history of a fall and landing on that side.  Past Medical History  Diagnosis Date  . Depression     Paranoid tendencies  . Diabetes mellitus     Type II  . GERD (gastroesophageal reflux disease)   . Hypertension   . Anxiety   . Arthritis     Left knee  . Adenomatous polyp 1999  . Hyperlipidemia   . Hypothyroidism   . Obesity   . Diverticulosis   . CAD (coronary artery disease) of artery bypass graft     a. NSTEMI in 07/2015: Cath showed 99% stenosis of distal LCx  --> DES placed    Assessment/Plan: 3 Days Post-Op Procedure(s) (LRB): INTRAMEDULLARY (IM) NAIL INTERTROCHANTRIC (Left) Principal Problem:   Femur fracture, left (HCC) Active Problems:   Hypothyroidism   Type 2 diabetes mellitus ([place])   Essential hypertension   GERD   HLD (hyperlipidemia)   Fracture of left humerus   Elevated troponin   Fall   NSTEMI (non-ST elevated myocardial infarction) ([place])  Estimated body mass index is 50.3 kg/(m^2) as calculated from the following:   Height as of this encounter: 5' 2.01" (1.575 m).   Weight as of this encounter: 124.785 kg (275 lb 1.6 oz). Advance diet Up with therapy   Continue left shoulder sling.  Will attempt to treat proximal humerus fracture  non-surgically. Up with therapy.  Therapy has been limited with the patient, recommend SNF placement. Pt has had several BM's. Care management to assist with discharge.  DVT Prophylaxis - Lovenox, Foot Pumps, TED hose and Plavix Weight-Bearing as tolerated to left leg and non-weight bearing to the left upper extremity.  Raquel Megumi Treaster, PA-C [facility] 08/07/2015, 8:45 AM

## 2015-08-07 NOTE — Progress Notes (Signed)
Pts EKG strip from this am (9:12) shows Trigeminy, HR 84. Nurse requested a strip now and EKG strip (17:04) shows SR PVC's. MD [facility] notified. Orders received for basic metabolic panel and Magnesium for tomm 0500. Will continue to monitor.

## 2015-08-07 NOTE — Progress Notes (Signed)
Physical Therapy Treatment Patient Details Name: Anita Small MRN: [id number] DOB: 1951/02/14 Today's Date: 08/07/2015    History of Present Illness Pt is a 65 y/o female here with L hip and shoulder fx.  She had ORIF of the hip    PT Comments    Pt is able to do some of the bed exercises with increased resistance, but continues to have pain with most acts, she has decreased mobility in the L knee which limits some L hip exercises and though she is able to get to sitting she again has considerable c/o nausea/dizziness and increased pain to the point that she pleads not to even try standing today.  Pt is very limited and shows frustration with her lack of ability to participate and general functional levels.   Follow Up Recommendations  SNF     Equipment Recommendations       Recommendations for Other Services       Precautions / Restrictions Precautions Precautions: Fall;Shoulder Type of Shoulder Precautions: NWBing secondary to fracture, pt is sling Restrictions Weight Bearing Restrictions: Yes LUE Weight Bearing: Non weight bearing LLE Weight Bearing: Weight bearing as tolerated    Mobility  Bed Mobility Overal bed mobility: Needs Assistance Bed Mobility: Supine to Sit;Sit to Supine     Supine to sit: Max assist;Mod assist Sit to supine: Max assist   General bed mobility comments: Pt is able to assist with getting to EOB, but c/o both shoulder and hip pain, dizziness, nausea and generally is too anxious to actually try to get to standing today.  She is able to sit at EOB w/o direct assist, but needs close supervision secondary to dizziness, anxiety and some impulsiveness  Transfers                 General transfer comment: Pt unable/unwilling to attempt today.  Too much pain and dizziness in sitting.  "O honey I can't, I know I need to try this but I just can't..."  Ambulation/Gait                 Stairs            Wheelchair Mobility    Modified Rankin (Stroke Patients Only)       Balance                                    Cognition Arousal/Alertness: Awake/alert Behavior During Therapy: Anxious Overall Cognitive Status: Within Functional Limits for tasks assessed                      Exercises General Exercises - Lower Extremity Ankle Circles/Pumps: AROM;10 reps Quad Sets: Strengthening;10 reps Gluteal Sets: Strengthening;10 reps Short Arc Quad: 10 reps;AROM Long Arc Quad: 10 reps;Both;Strengthening Heel Slides: AAROM;10 reps Hip ABduction/ADduction: 10 reps;AAROM;AROM Hip Flexion/Marching: 10 reps;AROM    General Comments        Pertinent Vitals/Pain Pain Location: Pt continues to have more L shoulder then hip pain, but overall all is limited with most acts    Home Living                      Prior Function            PT Goals (current goals can now be found in the care plan section) Progress towards PT goals: Progressing toward goals (very slow and limited progress thus  far)    Frequency  BID    PT Plan Current plan remains appropriate    Co-evaluation             End of Session Equipment Utilized During Treatment: Gait belt Activity Tolerance: Patient limited by pain Patient left: with bed alarm set;with call bell/phone within reach     Time: [id number] PT Time Calculation (min) (ACUTE ONLY): 26 min  Charges:  $Therapeutic Exercise: 8-22 mins $Therapeutic Activity: 8-22 mins                    G Codes:     Wayne Both, PT, DPT [phone]  Kreg Shropshire 08/07/2015, 10:39 AM

## 2015-08-07 NOTE — Progress Notes (Signed)
[facility] [place] NAME: Consepcion Small    MR#:  [id number]  DATE OF BIRTH:  11-15-1950  SUBJECTIVE:  CHIEF COMPLAINT:   Chief Complaint  Patient presents with  . Fall   - admitted after a fall, noted to have left hip fracture and also left shoulder fracture.  -Also had NSTEMI. Cardiac catheterization revealed 99% left circumflex blockage and patient had a stent put in on 08/01/2015 -s/p left hip surgery ad POD # 3 today, complains of pain. -Complains of significant pain and fatigue to work with physical therapy. Had a BM  REVIEW OF SYSTEMS:  Review of Systems  Constitutional: Positive for malaise/fatigue. Negative for fever and chills.  HENT: Negative for ear discharge, ear pain and tinnitus.   Respiratory: Negative for cough, shortness of breath and wheezing.   Cardiovascular: Negative for chest pain, palpitations and leg swelling.  Gastrointestinal: Negative for nausea, vomiting, abdominal pain, diarrhea and constipation.  Genitourinary: Negative for dysuria.  Musculoskeletal: Positive for myalgias and joint pain.       Left shoulder and also left hip pain  Neurological: Negative for dizziness, sensory change, speech change, focal weakness, seizures and headaches.  Psychiatric/Behavioral: Negative for depression.    DRUG ALLERGIES:   Allergies  Allergen Reactions  . Codeine Phosphate     REACTION: unspecified  . Duloxetine     REACTION: unspecified  . Meperidine Hcl     REACTION: unspecified  . Norco [Hydrocodone-Acetaminophen] Other (See Comments)    hallucinations  . Venlafaxine     REACTION: unspecified  . Doxycycline Rash    VITALS:  Blood pressure 125/58, pulse 82, temperature 98.7 F (37.1 C), temperature source Oral, resp. rate 16, height 5' 2.01" (1.575 m), weight 124.785 kg (275 lb 1.6 oz), SpO2 99 %.  PHYSICAL EXAMINATION:  Physical Exam  GENERAL:  65 y.o.-year-old patient lying in the bed with no  acute distress.  EYES: Pupils equal, round, reactive to light and accommodation. No scleral icterus. Extraocular muscles intact.  HEENT: Head atraumatic, normocephalic. Oropharynx and nasopharynx clear.  NECK:  Supple, no jugular venous distention. No thyroid enlargement, no tenderness.  LUNGS: Normal breath sounds bilaterally, no wheezing, rales,rhonchi or crepitation. No use of accessory muscles of respiration.  Decreased bibasilar breath sounds CARDIOVASCULAR: S1, S2 normal. No murmurs, rubs, or gallops.  ABDOMEN: Soft, nontender, nondistended. Bowel sounds present. No organomegaly or mass.  EXTREMITIES: No cyanosis, or clubbing. Left hip pain Left arm and shoulder region with significant bruising all over-left arm in sling today NEUROLOGIC: Cranial nerves II through XII are intact. Muscle strength 5/5 in all extremities except left arm and leg limited due to pain from fractures. Sensation intact. Gait not checked.  PSYCHIATRIC: The patient is alert and oriented x 3.  SKIN: No obvious rash, lesion, or ulcer.    LABORATORY PANEL:   CBC  Recent Labs Lab 08/06/15 0507  WBC 6.6  HGB 9.1*  HCT 28.0*  PLT 358   ------------------------------------------------------------------------------------------------------------------  Chemistries   Recent Labs Lab 08/01/15 0442  08/06/15 0507  NA  --   < > 139  K  --   < > 3.6  CL  --   < > 101  CO2  --   < > 29  GLUCOSE  --   < > 233*  BUN  --   < > 13  CREATININE 0.87  < > 0.84  CALCIUM  --   < > 8.4*  MG  2.0  --   --   < > = values in this interval not displayed. ------------------------------------------------------------------------------------------------------------------  Cardiac Enzymes  Recent Labs Lab 08/01/15 0442  TROPONINI 1.55*   ------------------------------------------------------------------------------------------------------------------  RADIOLOGY:  No results found.  EKG:   Orders placed or  performed during the hospital encounter of 07/28/15  . EKG 12-Lead  . EKG 12-Lead  . EKG 12-Lead  . EKG 12-Lead  . EKG 12-Lead  . EKG 12-Lead  . EKG 12-Lead immediately post procedure  . EKG 12-Lead  . EKG 12-Lead immediately post procedure  . EKG 12-Lead  . EKG 12-Lead  . EKG 12-Lead    ASSESSMENT AND PLAN:   65 year old female with past medical history significant for hypertension, hyperlipidemia, type 2 diabetes mellitus presented to the hospital after a fall. She was also noted to have elevated troponin and NSTEMI on admission along with left shoulder and left hip fractures.  #1 NSTEMI-appreciate cardiology consult. Patient had cardiac catheter done on 08/01/2015 showing 99% distal left circumflex stenosis and a 70% mid RCA stenosis. She had drug eluting stent put in the left circumflex. -Continue dual antiplatelet treatment with aspirin and Plavix. Also on statin  #2 community-acquired pneumonia- . -sputum cultures growing Pseudomonas. on Cipro - finish 10 day course -Continue oxygen as needed.  #3 left hip intertrochanteric fracture- ORIF POD # 3 today -Appreciate orthopedic consult.  -Postsurgical DVT prophylaxis and pain management and physical therapy consult. - physical therapy consulted  #4 left humeral head fracture-nondisplaced acute left humeral head and neck fracture. With significant bruising. -in a sling now -Possible surgery when stable and recovered from her other acute events. -Physical therapy consult and pain management  #5 diabetes mellitus-on sliding scale insulin, Tradjenta Metformin will be restarted today- sugars in 200 range Also Invokana on hold - can be restarted at discharge  #6 hypertension-currently on metoprolol. Home lisinopril and hydrochlorothiazide are on hold as BP is within normal limits. Monitor and add medications as needed  #7 DVT Prophylaxis- lovenox   Physical therapy recommended rehab- for SNF Monday if stable.  All the  records are reviewed and case discussed with Care Management/Social Workerr. Management plans discussed with the patient, family and they are in agreement.  CODE STATUS: Full Code  TOTAL TIME TAKING CARE OF THIS PATIENT: 40 minutes.   POSSIBLE D/C IN 2 DAYS, DEPENDING ON CLINICAL CONDITION.   Gladstone Lighter M.D on 08/07/2015 at 7:58 AM  Between 7am to 6pm - Pager - [phone]  After 6pm go to [email or web address] - password [email or web address] [facility]  Office  [phone]  CC: Primary care physician; Lenard Simmer, MD

## 2015-08-07 NOTE — Progress Notes (Signed)
Occupational Therapy Treatment Patient Details Name: Anita Small MRN: [id number] DOB: 1951-05-03 Today's Date: 08/07/2015    History of present illness Pt is a 65 y/o female here with L hip and shoulder fx.  She had ORIF of the hip   OT comments  Pt seen to discuss LB dressing skills but not able to demonstrate teach back due to patient still in bed.  Pt is interested in using adaptive equipment for ADLs in order to regain independence.  Also discussed safety in bathroom when she goes home to prevent any further falls.  She would benefit from a transfer tub bench when showering and BSC over toilet.  Assessed edema in L hand which is improved since Friday and is not in a sling.  Recommended pt keep L hand and forearm elevated on rolled up towel and/or ice pack.Continue ADL training.  Follow Up Recommendations  SNF    Equipment Recommendations       Recommendations for Other Services      Precautions / Restrictions Precautions Precautions: Fall;Shoulder Type of Shoulder Precautions: NWBing secondary to fracture, pt in sling Restrictions Weight Bearing Restrictions: Yes LUE Weight Bearing: Non weight bearing LLE Weight Bearing: Weight bearing as tolerated Other Position/Activity Restrictions: severe bruising along entire LUE and LLE and very edematous LUE and hand esp back of hand       Mobility Bed Mobility Overal bed mobility: Needs Assistance Bed Mobility: Supine to Sit;Sit to Supine     Supine to sit: Max assist;Mod assist Sit to supine: Max assist   General bed mobility comments: Pt is able to assist with getting to EOB, but c/o both shoulder and hip pain, dizziness, nausea and generally is too anxious to actually try to get to standing today.  She is able to sit at EOB w/o direct assist, but needs close supervision secondary to dizziness, anxiety and some impulsiveness  Transfers                 General transfer comment: Pt unable/unwilling to attempt  today.  Too much pain and dizziness in sitting.  "O honey I can't, I know I need to try this but I just can't..."    Balance                                   ADL                                         General ADL Comments: Discussed use of reacher and sock aid but not able to demonstrate teach back since pt was in bed and not up in chair yet and was c/o sweating.  NSG took vitals and they were stable.        Vision                     Perception     Praxis      Cognition   Behavior During Therapy: Anxious Overall Cognitive Status: Within Functional Limits for tasks assessed                       Extremity/Trunk Assessment               Exercises General Exercises - Lower Extremity Ankle Circles/Pumps: AROM;10 reps Quad Sets: Strengthening;10  reps Gluteal Sets: Strengthening;10 reps Short Arc Quad: 10 reps;AROM Long Arc Quad: 10 reps;Both;Strengthening Heel Slides: AAROM;10 reps Hip ABduction/ADduction: 10 reps;AAROM;AROM Hip Flexion/Marching: 10 reps;AROM   Shoulder Instructions       General Comments      Pertinent Vitals/ Pain       Pain Location: Pt continues to have more L shoulder then hip pain, but overall all is limited with most acts  Home Living                                          Prior Functioning/Environment              Frequency Min 1X/week     Progress Toward Goals  OT Goals(current goals can now be found in the care plan section)  Progress towards OT goals: Progressing toward goals  Acute Rehab OT Goals Patient Stated Goal: "to start doing things for myself again" OT Goal Formulation: With patient Time For Goal Achievement: 08/19/15 Potential to Achieve Goals: Good  Plan Discharge plan remains appropriate    Co-evaluation                 End of Session     Activity Tolerance Patient limited by fatigue;Patient limited by pain   Patient Left  in bed;with bed alarm set   Nurse Communication          Time: 1045-1100 OT Time Calculation (min): 15 min  Charges: OT General Charges $OT Visit: 1 Procedure OT Treatments $Self Care/Home Management : 8-22 mins  Wofford,Susan 08/07/2015, 12:10 PM   Chrys Racer, OTR/L ascom [phone]

## 2015-08-07 NOTE — Progress Notes (Signed)
Pt complaining of SOB. O2 100 % on 2 L of nasal cannula. BP 109/52. MD Kalesetti notified. Per MD encourage pt to use incentive spirometry. Will continue to monitor.

## 2015-08-07 NOTE — Care Management Note (Signed)
Case Management Note  Patient Details  Name: Anita Small MRN: [id number] Date of Birth: 1951-04-09  Subjective/Objective:       Anticipating discharge to SNF.              Action/Plan:   Expected Discharge Date:  08/02/15               Expected Discharge Plan:     In-House Referral:     Discharge planning Services  CM Consult  Post Acute Care Choice:  Home Health Choice offered to:  Patient  DME Arranged:    DME Agency:     HH Arranged:    HH Agency:  [facility]  Status of Service:  In process, will continue to follow  Medicare Important Message Given:  Yes Date Medicare IM Given:    Medicare IM give by:    Date Additional Medicare IM Given:    Additional Medicare Important Message give by:     If discussed at [place] of Stay Meetings, dates discussed:    Additional Comments:  Gurleen Larrivee A, RN 08/07/2015, 5:00 PM

## 2015-08-08 LAB — CBC
HEMATOCRIT: 27.7 % — AB (ref 35.0–47.0)
HEMOGLOBIN: 9 g/dL — AB (ref 12.0–16.0)
MCH: 28.2 pg (ref 26.0–34.0)
MCHC: 32.5 g/dL (ref 32.0–36.0)
MCV: 86.8 fL (ref 80.0–100.0)
Platelets: 422 10*3/uL (ref 150–440)
RBC: 3.19 MIL/uL — AB (ref 3.80–5.20)
RDW: 16.2 % — ABNORMAL HIGH (ref 11.5–14.5)
WBC: 6.8 10*3/uL (ref 3.6–11.0)

## 2015-08-08 LAB — BASIC METABOLIC PANEL
Anion gap: 3 — ABNORMAL LOW (ref 5–15)
BUN: 15 mg/dL (ref 6–20)
CO2: 35 mmol/L — ABNORMAL HIGH (ref 22–32)
Calcium: 8.4 mg/dL — ABNORMAL LOW (ref 8.9–10.3)
Chloride: 100 mmol/L — ABNORMAL LOW (ref 101–111)
Creatinine, Ser: 0.79 mg/dL (ref 0.44–1.00)
GFR calc Af Amer: >60 mL/min (ref >60)
GLUCOSE: 238 mg/dL — AB (ref 65–99)
Potassium: 4.1 mmol/L (ref 3.5–5.1)
Sodium: 138 mmol/L (ref 135–145)

## 2015-08-08 LAB — GLUCOSE, CAPILLARY: GLUCOSE-CAPILLARY: 262 mg/dL — AB (ref 65–99)

## 2015-08-08 LAB — MAGNESIUM: Magnesium: 1.8 mg/dL (ref 1.7–2.4)

## 2015-08-08 MED ORDER — METOPROLOL TARTRATE 25 MG PO TABS: 50.0000 mg | ORAL_TABLET | Freq: Two times a day (BID) | ORAL | Status: DC

## 2015-08-08 MED ORDER — ENOXAPARIN SODIUM 40 MG/0.4ML ~~LOC~~ SOLN: 40.0000 mg | SUBCUTANEOUS | Status: DC

## 2015-08-08 MED ORDER — ATORVASTATIN CALCIUM 40 MG PO TABS: 40.0000 mg | ORAL_TABLET | Freq: Every day | ORAL | Status: DC

## 2015-08-08 MED ORDER — DOCUSATE SODIUM 100 MG PO CAPS: 100.0000 mg | ORAL_CAPSULE | Freq: Two times a day (BID) | ORAL | Status: DC

## 2015-08-08 MED ORDER — TRAZODONE HCL 100 MG PO TABS: 100.0000 mg | ORAL_TABLET | Freq: Every day | ORAL | Status: DC

## 2015-08-08 MED ORDER — CIPROFLOXACIN HCL 500 MG PO TABS: 500.0000 mg | ORAL_TABLET | Freq: Two times a day (BID) | ORAL | Status: DC

## 2015-08-08 MED ORDER — BISACODYL 5 MG PO TBEC: 5.0000 mg | DELAYED_RELEASE_TABLET | Freq: Every day | ORAL | Status: DC | PRN

## 2015-08-08 MED ORDER — IPRATROPIUM-ALBUTEROL 0.5-2.5 (3) MG/3ML IN SOLN: 3.0000 mL | RESPIRATORY_TRACT | Status: DC | PRN

## 2015-08-08 MED ORDER — TRAMADOL HCL 50 MG PO TABS: 50.0000 mg | ORAL_TABLET | Freq: Four times a day (QID) | ORAL | Status: DC | PRN

## 2015-08-08 MED ORDER — GUAIFENESIN ER 600 MG PO TB12: 600.0000 mg | ORAL_TABLET | Freq: Two times a day (BID) | ORAL | Status: DC

## 2015-08-08 MED ORDER — OXYCODONE HCL 5 MG PO TABS: 5.0000 mg | ORAL_TABLET | ORAL | Status: DC | PRN

## 2015-08-08 MED ORDER — CLOPIDOGREL BISULFATE 75 MG PO TABS: 75.0000 mg | ORAL_TABLET | Freq: Every day | ORAL | Status: DC

## 2015-08-08 NOTE — Discharge Instructions (Signed)
Diet: As you were doing prior to hospitalization   Shower:  May shower but keep the wounds dry, use an occlusive plastic wrap, NO SOAKING IN TUB.  If the bandage gets wet, change with a clean dry gauze.  Dressing:  You may change your dressing as needed. Change the dressing with sterile gauze dressing.    Activity:  Increase activity slowly as tolerated, but follow the weight bearing instructions below.  No lifting or driving for 6 weeks.  Weight Bearing:   Weight bearing as tolerated to left lower extremity. Non weight bearing left upper extremity. Must wear sling at all times except for showering.  To prevent constipation: you may use a stool softener such as -  Colace (over the counter) 100 mg by mouth twice a day  Drink plenty of fluids (prune juice may be helpful) and high fiber foods Miralax (over the counter) for constipation as needed.    Itching:  If you experience itching with your medications, try taking only a single pain pill, or even half a pain pill at a time.  You may take up to 10 pain pills per day, and you can also use benadryl over the counter for itching or also to help with sleep.   Precautions:  If you experience chest pain or shortness of breath - call 911 immediately for transfer to the hospital emergency department!!  If you develop a fever greater that 101 F, purulent drainage from wound, increased redness or drainage from wound, or calf pain-Call [facility]                                              Follow- Up Appointment:  Please call for an appointment to be seen in 2 weeks at [facility]

## 2015-08-08 NOTE — Clinical Social Work Placement (Signed)
   CLINICAL SOCIAL WORK PLACEMENT  NOTE  Date:  08/08/2015  Patient Details  Name: Anita Small MRN: [id number] Date of Birth: September 07, 1950  Clinical Social Work is seeking post-discharge placement for this patient at the [place] level of care (*CSW will initial, date and re-position this form in  chart as items are completed):  Yes   Patient/family provided with [facility] Work Department's list of facilities offering this level of care within the geographic area requested by the patient (or if unable, by the patient's family).  Yes   Patient/family informed of their freedom to choose among providers that offer the needed level of care, that participate in Medicare, Medicaid or managed care program needed by the patient, have an available bed and are willing to accept the patient.  Yes   Patient/family informed of [facility]'s ownership interest in [facility] and [facility], as well as of the fact that they are under no obligation to receive care at these facilities.  PASRR submitted to EDS on 07/29/15 (Patient had an expired PASARR. )     PASRR number received on 07/29/15     Existing PASRR number confirmed on       FL2 transmitted to all facilities in geographic area requested by pt/family on 07/29/15     FL2 transmitted to all facilities within larger geographic area on       Patient informed that his/her managed care company has contracts with or will negotiate with certain facilities, including the following:        Yes   Patient/family informed of bed offers received.  Patient chooses bed at [facility]     Physician recommends and patient chooses bed at      Patient to be transferred to  ([facility] ) on 08/08/15.  Patient to be transferred to facility by  [facility] )     Patient family notified on 08/08/15 of transfer.  Name of family member notified:   (Patient's daughter [name] is at bedside and  aware of D/C today. )     PHYSICIAN       Additional Comment:    _______________________________________________ Loralyn Freshwater, LCSW 08/08/2015, 11:53 AM

## 2015-08-08 NOTE — Discharge Summary (Signed)
[facility] NAME: Anita Small    MR#:  [id number]  DATE OF BIRTH:  Sep 27, 1950  DATE OF ADMISSION:  07/28/2015 ADMITTING PHYSICIAN: Saundra Shelling, MD  DATE OF DISCHARGE: 08/08/2015  PRIMARY CARE PHYSICIAN: Lenard Simmer, MD    ADMISSION DIAGNOSIS:  Pain [R52] Troponin I above reference range [R79.89] Intertrochanteric fracture of left hip, closed, initial encounter ([place]) [S72.142A] Fracture, humerus, proximal, left, closed, initial encounter [S42.202A]  DISCHARGE DIAGNOSIS:  Principal Problem:   Femur fracture, left ([place]) Active Problems:   Hypothyroidism   Type 2 diabetes mellitus ([place])   Essential hypertension   GERD   HLD (hyperlipidemia)   Fracture of left humerus   Elevated troponin   Fall   NSTEMI (non-ST elevated myocardial infarction) ([place])   SECONDARY DIAGNOSIS:   Past Medical History  Diagnosis Date  . Depression     Paranoid tendencies  . Diabetes mellitus     Type II  . GERD (gastroesophageal reflux disease)   . Hypertension   . Anxiety   . Arthritis     Left knee  . Adenomatous polyp 1999  . Hyperlipidemia   . Hypothyroidism   . Obesity   . Diverticulosis   . CAD (coronary artery disease) of artery bypass graft     a. NSTEMI in 07/2015: Cath showed 99% stenosis of distal LCx  --> DES placed    HOSPITAL COURSE:   65 year old female with past medical history significant for hypertension, hyperlipidemia, type 2 diabetes mellitus presented to the hospital after a fall. She was also noted to have elevated troponin and NSTEMI on admission along with left shoulder and left hip fractures.  #1 NSTEMI-appreciate cardiology consult. Patient had cardiac catheter done on 08/01/2015 showing 99% distal left circumflex stenosis and a 70% mid RCA stenosis. She had drug eluting stent put in the left circumflex. -Continue dual antiplatelet treatment with aspirin and Plavix. Also on statin - follow  with cardiology clinic in 2 weeks.  #2 community-acquired pneumonia- . -sputum cultures growing Pseudomonas. on Cipro - finish 10 day course -Continue oxygen as needed.  #3 left hip intertrochanteric fracture- ORIF POD # 4 today -Appreciate orthopedic consult.  -Postsurgical DVT prophylaxis and pain management and physical therapy consult. - physical therapy consulted- arranged for SNF  #4 left humeral head fracture-nondisplaced acute left humeral head and neck fracture. With significant bruising. -in a sling now -Ortho suggested non surgical management, follow in office with sling. -Physical therapy consult and pain management  #5 diabetes mellitus-on sliding scale insulin, Tradjenta Metformin and januvia to restart. Also Invokana on hold - can be restarted at discharge  #6 hypertension-currently on metoprolol ( a new medicine for  Her due to NSTEMI ) .   Resume lisinopril, but not HCTZ due to BP running normal. #7 DVT Prophylaxis- lovenox  DISCHARGE CONDITIONS:   Stable.  CONSULTS OBTAINED:  Treatment Team:  Hessie Knows, MD Wellington Hampshire, MD  DRUG ALLERGIES:   Allergies  Allergen Reactions  . Codeine Phosphate     REACTION: unspecified  . Duloxetine     REACTION: unspecified  . Meperidine Hcl     REACTION: unspecified  . Norco [Hydrocodone-Acetaminophen] Other (See Comments)    hallucinations  . Venlafaxine     REACTION: unspecified  . Doxycycline Rash    DISCHARGE MEDICATIONS:   Current Discharge Medication List    START taking these medications   Details  atorvastatin (LIPITOR) 40 MG tablet Take 1  tablet (40 mg total) by mouth daily at 6 PM. Qty: 30 tablet, Refills: 0    bisacodyl (DULCOLAX) 5 MG EC tablet Take 1 tablet (5 mg total) by mouth daily as needed for moderate constipation. Qty: 30 tablet, Refills: 0    ciprofloxacin (CIPRO) 500 MG tablet Take 1 tablet (500 mg total) by mouth 2 (two) times daily. Qty: 10 tablet, Refills: 0     docusate sodium (COLACE) 100 MG capsule Take 1 capsule (100 mg total) by mouth 2 (two) times daily. Qty: 10 capsule, Refills: 0    enoxaparin (LOVENOX) 40 MG/0.4ML injection Inject 0.4 mLs (40 mg total) into the skin daily. X 14 days Qty: 14 Syringe, Refills: 0    guaiFENesin (MUCINEX) 600 MG 12 hr tablet Take 1 tablet (600 mg total) by mouth 2 (two) times daily. Qty: 20 tablet, Refills: 0    ipratropium-albuterol (DUONEB) 0.5-2.5 (3) MG/3ML SOLN Take 3 mLs by nebulization every 4 (four) hours as needed. Qty: 360 mL, Refills: 0    metoprolol (LOPRESSOR) 25 MG tablet Take 2 tablets (50 mg total) by mouth 2 (two) times daily. Qty: 60 tablet, Refills: 0    oxyCODONE (OXY IR/ROXICODONE) 5 MG immediate release tablet Take 1-2 tablets (5-10 mg total) by mouth every 4 (four) hours as needed for breakthrough pain ((for MODERATE breakthrough pain)). Qty: 30 tablet, Refills: 0    traMADol (ULTRAM) 50 MG tablet Take 1 tablet (50 mg total) by mouth every 6 (six) hours as needed for moderate pain. Qty: 30 tablet, Refills: 0      CONTINUE these medications which have CHANGED   Details  clopidogrel (PLAVIX) 75 MG tablet Take 1 tablet (75 mg total) by mouth daily with breakfast. Qty: 30 tablet, Refills: 0    traZODone (DESYREL) 100 MG tablet Take 1 tablet (100 mg total) by mouth at bedtime. Qty: 30 tablet, Refills: 0      CONTINUE these medications which have NOT CHANGED   Details  aspirin 81 MG chewable tablet Chew 81 mg by mouth daily.    canagliflozin (INVOKANA) 300 MG TABS tablet Take 300 mg by mouth daily.    cetirizine (ZYRTEC) 10 MG tablet Take 10 mg by mouth at bedtime.    citalopram (CELEXA) 40 MG tablet Take 40 mg by mouth daily.    fenofibrate (TRICOR) 145 MG tablet Take 145 mg by mouth daily.    levothyroxine (SYNTHROID, LEVOTHROID) 100 MCG tablet Take 1 tablet (100 mcg total) by mouth daily. Qty: 90 tablet, Refills: 1    lisinopril (PRINIVIL,ZESTRIL) 20 MG tablet Take 1  tablet (20 mg total) by mouth daily. Take everyday with HCTZ Qty: 90 tablet, Refills: 1    nystatin (MYCOSTATIN/NYSTOP) 100000 UNIT/GM POWD Apply topically 2 (two) times daily.    sitaGLIPtin-metformin (JANUMET) 50-1000 MG per tablet Take 1 tablet by mouth 2 (two) times daily with a meal. Qty: 180 tablet, Refills: 1    triamcinolone cream (KENALOG) 0.1 % Apply 1 application topically 2 (two) times daily as needed (for irritation).      STOP taking these medications     hydrochlorothiazide (HYDRODIURIL) 25 MG tablet      insulin lispro (HUMALOG) 100 UNIT/ML injection      simvastatin (ZOCOR) 40 MG tablet          DISCHARGE INSTRUCTIONS:   Follow with Cardiology and Orthopedic clinic in 2 weeks.  If you experience worsening of your admission symptoms, develop shortness of breath, life threatening emergency, suicidal or homicidal thoughts  you must seek medical attention immediately by calling 911 or calling your MD immediately  if symptoms less severe.  You Must read complete instructions/literature along with all the possible adverse reactions/side effects for all the Medicines you take and that have been prescribed to you. Take any new Medicines after you have completely understood and accept all the possible adverse reactions/side effects.   Please note  You were cared for by a hospitalist during your hospital stay. If you have any questions about your discharge medications or the care you received while you were in the hospital after you are discharged, you can call the unit and asked to speak with the hospitalist on call if the hospitalist that took care of you is not available. Once you are discharged, your primary care physician will handle any further medical issues. Please note that NO REFILLS for any discharge medications will be authorized once you are discharged, as it is imperative that you return to your primary care physician (or establish a relationship with a primary  care physician if you do not have one) for your aftercare needs so that they can reassess your need for medications and monitor your lab values.    Today   CHIEF COMPLAINT:   Chief Complaint  Patient presents with  . Fall    HISTORY OF PRESENT ILLNESS:  Atianna Clodfelter  is a 65 y.o. female with a known history of type 2 diabetes mellitus, GERD, hypertension, hyperlipidemia, diverticulosis had a fall in her bathroom and 1 AM on Thursday morning. Patient was lying on the floor for several hours. Her telephone was in the bedroom and she could not reach it. She did not pass out or lose consciousness. Patient kept screaming and her neighbor responded in the afternoon probably around 3:30 PM and EMS was called and later on patient was brought to the emergency room. When patient fell down she landed on left side of the body hitting the left shoulder and left hip area to the ground. Patient has an ecchymosis and swelling of the left shoulder. She has pain in the left shoulder and left hip. Pain is aching in nature 8 out of 10 on a scale of 1-10. Pain is relieved with an analgesic medication. During the workup in the emergency room imaging study showed left femur intertrochanteric fracture and left humerus fracture. Patient also has elevated troponin. No complaints of chest pain. No history of any shortness of breath. No orthopnea or paroxysmal nocturnal dyspnea.   VITAL SIGNS:  Blood pressure 151/69, pulse 78, temperature 97.6 F (36.4 C), temperature source Oral, resp. rate 20, height 5' 2.01" (1.575 m), weight 124.785 kg (275 lb 1.6 oz), SpO2 98 %.  I/O:   Intake/Output Summary (Last 24 hours) at 08/08/15 1003 Last data filed at 08/08/15 0650  Gross per 24 hour  Intake    126 ml  Output    400 ml  Net   -274 ml    PHYSICAL EXAMINATION:   GENERAL: 65 y.o.-year-old patient lying in the bed with no acute distress.  EYES: Pupils equal, round, reactive to light and accommodation. No scleral  icterus. Extraocular muscles intact.  HEENT: Head atraumatic, normocephalic. Oropharynx and nasopharynx clear.  NECK: Supple, no jugular venous distention. No thyroid enlargement, no tenderness.  LUNGS: Normal breath sounds bilaterally, no wheezing, rales,rhonchi or crepitation. No use of accessory muscles of respiration. Decreased bibasilar breath sounds CARDIOVASCULAR: S1, S2 normal. No murmurs, rubs, or gallops.  ABDOMEN: Soft, nontender, nondistended. Bowel sounds  present. No organomegaly or mass.  EXTREMITIES: No cyanosis, or clubbing. Left hip pain Left arm and shoulder region with significant bruising all over-left arm in sling today NEUROLOGIC: Cranial nerves II through XII are intact. Muscle strength 5/5 in all extremities except left arm and leg limited due to pain from fractures. Sensation intact. Gait not checked.  PSYCHIATRIC: The patient is alert and oriented x 3.  SKIN: No obvious rash, lesion, or ulcer.   DATA REVIEW:   CBC  Recent Labs Lab 08/08/15 0517  WBC 6.8  HGB 9.0*  HCT 27.7*  PLT 422    Chemistries   Recent Labs Lab 08/08/15 0517  NA 138  K 4.1  CL 100*  CO2 35*  GLUCOSE 238*  BUN 15  CREATININE 0.79  CALCIUM 8.4*  MG 1.8    Cardiac Enzymes No results for input(s): TROPONINI in the last 168 hours.  Microbiology Results  Results for orders placed or performed during the hospital encounter of 07/28/15  Culture, expectorated sputum-assessment     Status: None   Collection Time: 07/31/15  1:11 PM  Result Value Ref Range Status   Specimen Description EXPECTORATED SPUTUM  Final   Special Requests Normal  Final   Sputum evaluation THIS SPECIMEN IS ACCEPTABLE FOR SPUTUM CULTURE  Final   Report Status 07/31/2015 FINAL  Final  Culture, respiratory (NON-Expectorated)     Status: None   Collection Time: 07/31/15  1:11 PM  Result Value Ref Range Status   Specimen Description EXPECTORATED SPUTUM  Final   Special Requests Normal Reflexed  from [id number]  Final   Gram Stain   Final    FEW WBC SEEN NO SQUAMOUS EPITHELIAL CELLS PRESENT NO ORGANISMS SEEN EXCELLENT SPECIMEN - 90-100% WBCS    Culture RARE GROWTH PSEUDOMONAS AERUGINOSA  Final   Report Status 08/04/2015 FINAL  Final   Organism ID, Bacteria PSEUDOMONAS AERUGINOSA  Final      Susceptibility   Pseudomonas aeruginosa - MIC*    CEFTAZIDIME 2 SENSITIVE Sensitive     CIPROFLOXACIN <=0.25 SENSITIVE Sensitive     GENTAMICIN <=1 SENSITIVE Sensitive     IMIPENEM 1 SENSITIVE Sensitive     CEFEPIME <=1 SENSITIVE Sensitive     * RARE GROWTH PSEUDOMONAS AERUGINOSA  MRSA PCR Screening     Status: None   Collection Time: 08/02/15  6:43 AM  Result Value Ref Range Status   MRSA by PCR NEGATIVE NEGATIVE Final    Comment:        The GeneXpert MRSA Assay (FDA approved for NASAL specimens only), is one component of a comprehensive MRSA colonization surveillance program. It is not intended to diagnose MRSA infection nor to guide or monitor treatment for MRSA infections.     RADIOLOGY:  No results found.   Management plans discussed with the patient, family and they are in agreement.  CODE STATUS:     Code Status Orders        Start     Ordered   07/29/15 0548  Full code   Continuous     07/29/15 0547    Code Status History    Date Active Date Inactive Code Status Order ID Comments User Context   07/28/2015 10:34 PM 07/29/2015  5:47 AM Full Code [id number]  Hessie Knows, MD ED    Advance Directive Documentation        Most Recent Value   Type of Advance Directive  Healthcare Power of Attorney   Pre-existing out of facility DNR order (  yellow form or pink MOST form)     "MOST" Form in Place?        TOTAL TIME TAKING CARE OF THIS PATIENT: 35 minutes.    Vaughan Basta M.D on 08/08/2015 at 10:03 AM  Between 7am to 6pm - Pager - [phone]  After 6pm go to [email or web address] - password [email or web address] [facility]  Office   [phone]  CC: Primary care physician; Lenard Simmer, MD   Note: This dictation was prepared with Dragon dictation along with smaller phrase technology. Any transcriptional errors that result from this process are unintentional.

## 2015-08-08 NOTE — Progress Notes (Signed)
Called report to [facility] and called for nonemergent transport.

## 2015-08-08 NOTE — Progress Notes (Signed)
Physical Therapy Treatment Patient Details Name: Anita Small MRN: [id number] DOB: [birth year] Today's Date: 08/08/2015    History of Present Illness Pt is a 65 y/o female here with L hip and shoulder fx.  She had ORIF of the hip    PT Comments    Pt performs supine bed exercises well tolerating resistance on the right and mostly active range of motion on the left. Pt requires considerable assist to sit to edge of bed and continues with dizziness, but tolerates well with graduated increments with rest time in between and assisted sitting. Once seated, pt can sit unassisted with 1 arm support. Tolerates x 10 minutes before feeling fatigued. Pt with discharge orders to skilled nursing facility today.  Follow Up Recommendations  SNF     Equipment Recommendations       Recommendations for Other Services       Precautions / Restrictions Precautions Precautions: Fall;Shoulder Type of Shoulder Precautions: NWBing secondary to fracture, pt in sling Restrictions Weight Bearing Restrictions: Yes LUE Weight Bearing: Non weight bearing LLE Weight Bearing: Weight bearing as tolerated    Mobility  Bed Mobility Overal bed mobility: Needs Assistance Bed Mobility: Supine to Sit;Sit to Supine     Supine to sit: Max assist Sit to supine: Max assist   General bed mobility comments: Increased time effort and requires considerable assist. Takes time to get to stable static sit without assist due to dizziness. 10 min sitting edge of bed with L ankle circles, DF/PF and inver/ever and seated hip knee  Transfers                    Ambulation/Gait                 Stairs            Wheelchair Mobility    Modified Rankin (Stroke Patients Only)       Balance                                    Cognition Arousal/Alertness: Awake/alert Behavior During Therapy: WFL for tasks assessed/performed Overall Cognitive Status: Within Functional Limits  for tasks assessed                      Exercises General Exercises - Lower Extremity Ankle Circles/Pumps: AROM;Both;20 reps;Supine Quad Sets: Strengthening;Both;20 reps;Supine Gluteal Sets: Strengthening;Both;20 reps;Supine Short Arc Quad: AROM;Both;20 reps;Supine (Resistive on R) Long Arc Quad: AROM;Both;20 reps;Supine Heel Slides: AROM;Left;20 reps;Supine (RROM R) Hip ABduction/ADduction: AROM;Left;20 reps;Supine (RROM R) Straight Leg Raises: AAROM;Both;10 reps;Supine Hip Flexion/Marching: AAROM;Left;20 reps;Seated (AROM R) Other Exercises Other Exercises: Adductor squeeze; pillow 20x Other Exercises: R gravity assisted resisted parital range ham curl 20x    General Comments        Pertinent Vitals/Pain Pain Assessment: 0-10 Pain Score: 7  Pain Location: L shoulder, thigh and inside of knee Pain Descriptors / Indicators: Aching    Home Living                      Prior Function            PT Goals (current goals can now be found in the care plan section) Progress towards PT goals: Progressing toward goals (slowly)    Frequency  BID    PT Plan Current plan remains appropriate    Co-evaluation  End of Session Equipment Utilized During Treatment: Oxygen (desaturates on room air with bed exercises to 88-90%) Activity Tolerance: No increased pain;Patient limited by fatigue;Patient limited by pain Patient left: in bed;with call bell/phone within reach;with bed alarm set;with SCD's reapplied     Time: 1010-1100 PT Time Calculation (min) (ACUTE ONLY): 50 min  Charges:  $Therapeutic Exercise: 23-37 mins $Therapeutic Activity: 8-22 mins                    G Codes:      Charlaine Dalton 08/08/2015, 11:05 AM

## 2015-08-08 NOTE — Progress Notes (Signed)
Patient is medically stable for D/C to [facility] today. Per [facility] liaison patient is going to room 801. RN will call report to [address] Yaakov Guthrie at [phone] and arrange EMS for transport. Clinical Education officer, museum (CSW) sent D/C Summary, FL2 and D/C Packet to [facility] [employer] via [employer]. Per [facility] authorization has been received. Patient is aware of above. Patient's daughter [name] is at bedside and aware of above. Please reconsult if future social work needs arise. CSW signing off.   Blima Rich, LCSW [phone]

## 2015-08-08 NOTE — Progress Notes (Signed)
Subjective: 4 Days Post-Op Procedure(s) (LRB): INTRAMEDULLARY (IM) NAIL INTERTROCHANTRIC (Left)  Non-displaced left proximal humerus fracture. Patient reports pain as 0 on 0-10 scale and 6 on 0-10 scale with movement.   Patient is well, and has had no acute complaints or problems Plan is to go Skilled nursing facility after hospital stay. Negative for chest pain and shortness of breath Gastrointestinal:Negative for nausea and vomiting  Objective: Vital signs in last 24 hours: Temp:  [97.6 F (36.4 C)-98.6 F (37 C)] 97.6 F (36.4 C) (02/13 0425) Pulse Rate:  [66-90] 76 (02/13 0425) Resp:  [16-20] 18 (02/13 0425) BP: (100-134)/(45-78) 109/56 mmHg (02/13 0425) SpO2:  [97 %-100 %] 99 % (02/13 0425)  Intake/Output from previous day:  Intake/Output Summary (Last 24 hours) at 08/08/15 0749 Last data filed at 08/08/15 0650  Gross per 24 hour  Intake    366 ml  Output    700 ml  Net   -334 ml    Intake/Output this shift:    Labs:  Recent Labs  08/06/15 0507 08/08/15 0517  HGB 9.1* 9.0*    Recent Labs  08/06/15 0507 08/08/15 0517  WBC 6.6 6.8  RBC 3.22* 3.19*  HCT 28.0* 27.7*  PLT 358 422    Recent Labs  08/06/15 0507 08/08/15 0517  NA 139 138  K 3.6 4.1  CL 101 100*  CO2 29 35*  BUN 13 15  CREATININE 0.84 0.79  GLUCOSE 233* 238*  CALCIUM 8.4* 8.4*   No results for input(s): LABPT, INR in the last 72 hours.   EXAM General - Patient is Alert, Appropriate and Oriented Extremity - Neurologically intact ABD soft Sensation intact distally Intact pulses distally Dorsiflexion/Plantar flexion intact Incision: dressing C/D/I  moderate ecchymosis along the patient's left arm and thigh Pt is intact to light touch to the left upper extremity, able to make a full composite fist.  Cap refill to each finger is normal. Sling in place properly. Dressing/Incision - clean, dry, no drainage Motor Function - intact, moving foot and toes well on exam.   Severe  ecchymosis along the patient's left side due to history of a fall and landing on that side.  Past Medical History  Diagnosis Date  . Depression     Paranoid tendencies  . Diabetes mellitus     Type II  . GERD (gastroesophageal reflux disease)   . Hypertension   . Anxiety   . Arthritis     Left knee  . Adenomatous polyp 1999  . Hyperlipidemia   . Hypothyroidism   . Obesity   . Diverticulosis   . CAD (coronary artery disease) of artery bypass graft     a. NSTEMI in 07/2015: Cath showed 99% stenosis of distal LCx  --> DES placed    Assessment/Plan: 4 Days Post-Op Procedure(s) (LRB): INTRAMEDULLARY (IM) NAIL INTERTROCHANTRIC (Left) Principal Problem:   Femur fracture, left ([facility]) Active Problems:   Hypothyroidism   Type 2 diabetes mellitus ([place])   Essential hypertension   GERD   HLD (hyperlipidemia)   Fracture of left humerus   Elevated troponin   Fall   NSTEMI (non-ST elevated myocardial infarction) ([place])  Estimated body mass index is 50.3 kg/(m^2) as calculated from the following:   Height as of this encounter: 5' 2.01" (1.575 m).   Weight as of this encounter: 124.785 kg (275 lb 1.6 oz). Advance diet Up with therapy   Continue left shoulder sling.  Will attempt to treat proximal humerus fracture non-surgically. Up with therapy.  Therapy has been limited with the patient, recommend SNF placement. Follow up with [facility] in 2 weeks for staple removal and xrays of left shoulder  DVT Prophylaxis - Lovenox, Foot Pumps, TED hose and Plavix Weight-Bearing as tolerated to left leg and non-weight bearing to the left upper extremity.  Duanne Guess, PA-C [facility] 08/08/2015, 7:49 AM

## 2015-08-08 NOTE — Care Management Important Message (Signed)
Important Message  Patient Details  Name: Zeniya Odegaard MRN: [id number] Date of Birth: 1950-07-23   Medicare Important Message Given:  Yes    Nihaal Friesen A, RN 08/08/2015, 11:25 AM

## 2015-08-13 ENCOUNTER — Other Ambulatory Visit
Admission: RE | Admit: 2015-08-13 | Discharge: 2015-08-13 | Disposition: A | Discharge status: Home / Self Care | Payer: Medicare HMO | Source: Ambulatory Visit | Attending: Family Medicine | Admitting: Family Medicine

## 2015-08-13 DIAGNOSIS — E119 Type 2 diabetes mellitus without complications: Secondary | ICD-10-CM | POA: Diagnosis not present

## 2015-08-13 DIAGNOSIS — E039 Hypothyroidism, unspecified: Secondary | ICD-10-CM | POA: Insufficient documentation

## 2015-08-13 DIAGNOSIS — D649 Anemia, unspecified: Secondary | ICD-10-CM | POA: Insufficient documentation

## 2015-08-13 LAB — CBC WITH DIFFERENTIAL/PLATELET
BASOS ABS: 0.1 10*3/uL (ref 0–0.1)
BASOS PCT: 1 %
Eosinophils Absolute: 0.2 10*3/uL (ref 0–0.7)
Eosinophils Relative: 2 %
HEMATOCRIT: 31.3 % — AB (ref 35.0–47.0)
HEMOGLOBIN: 9.6 g/dL — AB (ref 12.0–16.0)
LYMPHS PCT: 17 %
Lymphs Abs: 1.5 10*3/uL (ref 1.0–3.6)
MCH: 27.7 pg (ref 26.0–34.0)
MCHC: 30.6 g/dL — ABNORMAL LOW (ref 32.0–36.0)
MCV: 90.6 fL (ref 80.0–100.0)
MONO ABS: 0.6 10*3/uL (ref 0.2–0.9)
Monocytes Relative: 6 %
NEUTROS ABS: 6.5 10*3/uL (ref 1.4–6.5)
NEUTROS PCT: 74 %
Platelets: 538 10*3/uL — ABNORMAL HIGH (ref 150–440)
RBC: 3.45 MIL/uL — AB (ref 3.80–5.20)
RDW: 17.5 % — AB (ref 11.5–14.5)
WBC: 8.8 10*3/uL (ref 3.6–11.0)

## 2015-08-13 LAB — COMPREHENSIVE METABOLIC PANEL
ALBUMIN: 3 g/dL — AB (ref 3.5–5.0)
ALK PHOS: 99 U/L (ref 38–126)
ALT: 14 U/L (ref 14–54)
ANION GAP: 10 (ref 5–15)
AST: 16 U/L (ref 15–41)
BUN: 19 mg/dL (ref 6–20)
CALCIUM: 8.4 mg/dL — AB (ref 8.9–10.3)
CO2: 30 mmol/L (ref 22–32)
Chloride: 103 mmol/L (ref 101–111)
Creatinine, Ser: 0.82 mg/dL (ref 0.44–1.00)
GFR calc Af Amer: >60 mL/min (ref >60)
GFR calc non Af Amer: >60 mL/min (ref >60)
GLUCOSE: 173 mg/dL — AB (ref 65–99)
POTASSIUM: 4.6 mmol/L (ref 3.5–5.1)
SODIUM: 143 mmol/L (ref 135–145)
TOTAL PROTEIN: 5.8 g/dL — AB (ref 6.5–8.1)
Total Bilirubin: 0.8 mg/dL (ref 0.3–1.2)

## 2015-08-13 LAB — HEMOGLOBIN A1C: Hgb A1c MFr Bld: 6.7 % — ABNORMAL HIGH (ref 4.0–6.0)

## 2015-08-13 LAB — TSH: TSH: 1.991 u[IU]/mL (ref 0.350–4.500)

## 2015-08-17 ENCOUNTER — Other Ambulatory Visit
Admission: RE | Admit: 2015-08-17 | Discharge: 2015-08-17 | Disposition: A | Discharge status: Home / Self Care | Payer: Medicare HMO | Source: Ambulatory Visit | Attending: Family Medicine | Admitting: Family Medicine

## 2015-08-17 DIAGNOSIS — D649 Anemia, unspecified: Secondary | ICD-10-CM | POA: Diagnosis present

## 2015-08-17 LAB — COMPREHENSIVE METABOLIC PANEL
ALT: 11 U/L — ABNORMAL LOW (ref 14–54)
ANION GAP: 4 — AB (ref 5–15)
AST: 12 U/L — ABNORMAL LOW (ref 15–41)
Albumin: 2.8 g/dL — ABNORMAL LOW (ref 3.5–5.0)
Alkaline Phosphatase: 114 U/L (ref 38–126)
BUN: 15 mg/dL (ref 6–20)
CHLORIDE: 108 mmol/L (ref 101–111)
CO2: 30 mmol/L (ref 22–32)
Calcium: 8.4 mg/dL — ABNORMAL LOW (ref 8.9–10.3)
Creatinine, Ser: 0.83 mg/dL (ref 0.44–1.00)
GFR calc non Af Amer: >60 mL/min (ref >60)
GLUCOSE: 146 mg/dL — AB (ref 65–99)
POTASSIUM: 4.4 mmol/L (ref 3.5–5.1)
SODIUM: 142 mmol/L (ref 135–145)
Total Bilirubin: 0.7 mg/dL (ref 0.3–1.2)
Total Protein: 5.5 g/dL — ABNORMAL LOW (ref 6.5–8.1)

## 2015-09-01 ENCOUNTER — Encounter: Payer: Self-pay | Admitting: Gastroenterology

## 2015-10-04 ENCOUNTER — Encounter (INDEPENDENT_AMBULATORY_CARE_PROVIDER_SITE_OTHER): Payer: Self-pay

## 2015-10-04 ENCOUNTER — Encounter: Payer: Self-pay | Admitting: Cardiovascular Disease

## 2015-10-04 ENCOUNTER — Ambulatory Visit (INDEPENDENT_AMBULATORY_CARE_PROVIDER_SITE_OTHER): Payer: Medicare HMO | Admitting: Cardiovascular Disease

## 2015-10-04 VITALS — BP 100/60 | HR 50 | Ht 61.0 in

## 2015-10-04 DIAGNOSIS — Z7189 Other specified counseling: Secondary | ICD-10-CM

## 2015-10-04 DIAGNOSIS — Z7689 Persons encountering health services in other specified circumstances: Secondary | ICD-10-CM

## 2015-10-04 MED ORDER — METOPROLOL TARTRATE 25 MG PO TABS: 25.0000 mg | ORAL_TABLET | Freq: Two times a day (BID) | ORAL | Status: DC

## 2015-10-04 NOTE — Progress Notes (Signed)
Cardiology Office Note   Date:  10/04/2015   ID:  Anita Small, DOB Feb 22, 1951, MRN [id number]  PCP:  Lenard Simmer, MD  Cardiologist:   Kathlyn Sacramento, MD   Chief Complaint  Patient presents with  . Establish Care    post hsp care for stent placement      History of Present Illness: Anita Small is a 65 y.o. female who presents for a post hospitalization follow-up. She has known history of type 2 diabetes, hypertension, hyperlipidemia and obesity. She presented to [facility] in February with a mechanical fall which resulted in left hip fracture with significant contusion and hematoma to the left shoulder. She was found to have mildly elevated troponin. Echocardiogram showed normal LV systolic function with mild mitral regurgitation and mild pulmonary hypertension. I proceeded with cardiac catheterization which showed 99% stenosis in the distal left circumflex, 70% stenosis in a small nondominant right coronary artery and moderate distal LAD stenosis. I performed successful angioplasty and drug-eluting stent placement to the left circumflex without complications.  She subsequently underwent left hip repair without complications and was discharged to rehabilitation. Since then, she has been doing well and denies any chest pain or shortness of breath. She is undergoing physical therapy and she has been able to walk with a walker.  Past Medical History  Diagnosis Date  . Depression     Paranoid tendencies  . Diabetes mellitus     Type II  . GERD (gastroesophageal reflux disease)   . Hypertension   . Anxiety   . Arthritis     Left knee  . Adenomatous polyp 1999  . Hyperlipidemia   . Hypothyroidism   . Obesity   . Diverticulosis   . CAD (coronary artery disease) of artery bypass graft     a. NSTEMI in 07/2015: Cath showed 99% stenosis of distal LCx  --> DES placed    Past Surgical History  Procedure Laterality Date  . Heel spur surgery  03/03    Left  .  Knee arthroscopy  2001    Left   Tamala Julian)  . Abdominal hysterectomy  1977    One ovary left  . Bladder repair  1977    Tacking  . Cervical spine surgery  1999  . Ct abd w & pelvis wo cm  2002    Negative. Pituitary normal on MRI  . Carotid endarterectomy  2/11    Dr Jamal Collin  . Cardiovascular stress test  7/00    negative. No cardiolite  . Transthoracic echocardiogram  3/04    normal  . Appendectomy    . Cholecystectomy    . Total knee arthroplasty  11/12    left--Dr Tamala Julian  . Cardiac catheterization N/A 08/01/2015    Procedure: Left Heart Cath and Coronary Angiography;  Surgeon: Wellington Hampshire, MD;  Location: [facility];  Service: Cardiovascular;  Laterality: N/A;  . Cardiac catheterization N/A 08/01/2015    Procedure: Coronary Stent Intervention;  Surgeon: Wellington Hampshire, MD;  Location: [facility];  Service: Cardiovascular;  Laterality: N/A;  . Intramedullary (im) nail intertrochanteric Left 08/04/2015    Procedure: INTRAMEDULLARY (IM) NAIL INTERTROCHANTRIC;  Surgeon: Hessie Knows, MD;  Location: [facility];  Service: Orthopedics;  Laterality: Left;     Current Outpatient Prescriptions  Medication Sig Dispense Refill  . bisacodyl (DULCOLAX) 5 MG EC tablet Take 1 tablet (5 mg total) by mouth daily as needed for moderate constipation. 30 tablet 0  . canagliflozin (  INVOKANA) 300 MG TABS tablet Take 300 mg by mouth daily.    . cetirizine (ZYRTEC) 10 MG tablet Take 10 mg by mouth at bedtime.    . ciprofloxacin (CIPRO) 500 MG tablet Take 1 tablet (500 mg total) by mouth 2 (two) times daily. 10 tablet 0  . citalopram (CELEXA) 40 MG tablet Take 40 mg by mouth daily.    . clopidogrel (PLAVIX) 75 MG tablet Take 1 tablet (75 mg total) by mouth daily with breakfast. 30 tablet 0  . docusate sodium (COLACE) 100 MG capsule Take 1 capsule (100 mg total) by mouth 2 (two) times daily. 10 capsule 0  . enoxaparin (LOVENOX) 40 MG/0.4ML injection Inject 0.4 mLs (40 mg total) into the  skin daily. X 14 days 14 Syringe 0  . fenofibrate (TRICOR) 145 MG tablet Take 145 mg by mouth daily.    Marland Kitchen guaiFENesin (MUCINEX) 600 MG 12 hr tablet Take 1 tablet (600 mg total) by mouth 2 (two) times daily. 20 tablet 0  . ipratropium-albuterol (DUONEB) 0.5-2.5 (3) MG/3ML SOLN Take 3 mLs by nebulization every 4 (four) hours as needed. 360 mL 0  . levothyroxine (SYNTHROID, LEVOTHROID) 100 MCG tablet Take 1 tablet (100 mcg total) by mouth daily. 90 tablet 1  . lisinopril (PRINIVIL,ZESTRIL) 20 MG tablet Take 1 tablet (20 mg total) by mouth daily. Take everyday with HCTZ (Patient taking differently: Take 20 mg by mouth daily. ) 90 tablet 1  . metoprolol tartrate (LOPRESSOR) 25 MG tablet Take 1 tablet (25 mg total) by mouth 2 (two) times daily. 60 tablet 3  . nystatin (MYCOSTATIN/NYSTOP) 100000 UNIT/GM POWD Apply topically 2 (two) times daily.    Marland Kitchen oxyCODONE (OXY IR/ROXICODONE) 5 MG immediate release tablet Take 1-2 tablets (5-10 mg total) by mouth every 4 (four) hours as needed for breakthrough pain ((for MODERATE breakthrough pain)). 30 tablet 0  . sitaGLIPtin-metformin (JANUMET) 50-1000 MG per tablet Take 1 tablet by mouth 2 (two) times daily with a meal. 180 tablet 1  . traMADol (ULTRAM) 50 MG tablet Take 1 tablet (50 mg total) by mouth every 6 (six) hours as needed for moderate pain. 30 tablet 0  . traZODone (DESYREL) 100 MG tablet Take 1 tablet (100 mg total) by mouth at bedtime. 30 tablet 0  . triamcinolone cream (KENALOG) 0.1 % Apply 1 application topically 2 (two) times daily as needed (for irritation).    Marland Kitchen aspirin 81 MG chewable tablet Chew 81 mg by mouth daily. Reported on 10/04/2015    . atorvastatin (LIPITOR) 40 MG tablet Take 1 tablet (40 mg total) by mouth daily at 6 PM. (Patient not taking: Reported on 10/04/2015) 30 tablet 0   No current facility-administered medications for this visit.    Allergies:   Codeine phosphate; Duloxetine; Meperidine hcl; Norco; Venlafaxine; and Doxycycline     Social History:  The patient  reports that she has quit smoking. Her smoking use included Cigarettes. She has never used smokeless tobacco. She reports that she does not drink alcohol or use illicit drugs.   Family History:  The patient's family history includes Cancer in her mother and paternal grandmother; Colon polyps in her mother; Depression in her daughter; Diabetes in her father and mother; Heart disease in her father and mother; Hypertension in her sister.    ROS:  Please see the history of present illness.   Otherwise, review of systems are positive for none.   All other systems are reviewed and negative.    PHYSICAL EXAM: VS:  BP 100/60 mmHg  Pulse 50  Ht 5\' 1"  (1.549 m)  Wt   SpO2 97% , BMI There is no weight on file to calculate BMI. GEN: Well nourished, well developed, in no acute distress HEENT: normal Neck: no JVD, carotid bruits, or masses Cardiac: RRR; bradycardic, no murmurs, rubs, or gallops,no edema  Respiratory:  clear to auscultation bilaterally, normal work of breathing GI: soft, nontender, nondistended, + BS MS: no deformity or atrophy Skin: warm and dry, no rash Neuro:  Strength and sensation are intact Psych: euthymic mood, full affect   EKG:  EKG is ordered today. The ekg ordered today demonstrates sinus bradycardia with a heart rate of 50 bpm and poor R-wave progression in the anterior leads.   Recent Labs: 08/08/2015: Magnesium 1.8 08/13/2015: Hemoglobin 9.6*; Platelets 538*; TSH 1.991 08/17/2015: ALT 11*; BUN 15; Creatinine, Ser 0.83; Potassium 4.4; Sodium 142    Lipid Panel    Component Value Date/Time   CHOL 135 06/12/2014 0503   CHOL 148 01/07/2014   TRIG 289* 06/12/2014 0503   TRIG 128.0 08/07/2011 1145   HDL 28* 06/12/2014 0503   HDL 34* 01/07/2014   CHOLHDL 3 08/07/2011 1145   VLDL 58* 06/12/2014 0503   VLDL 25.6 08/07/2011 1145   LDLCALC 49 06/12/2014 0503   LDLCALC 75 01/07/2014      Wt Readings from Last 3 Encounters:   08/01/15 275 lb 1.6 oz (124.785 kg)  02/23/14 252 lb (114.306 kg)  03/05/13 263 lb (119.296 kg)        ASSESSMENT AND PLAN:  1.  Coronary artery disease involving native coronary arteries without angina: The patient had non-ST elevation myocardial infarction February with 1 vessel coronary artery disease on cardiac catheterization status post successful angioplasty and drug-eluting stent placement. She is doing well overall with no recurrent angina. Continue dual antiplatelet therapy at least until February 2018. She was noted to be bradycardic today and thus I decreased the dose of metoprolol to 25 mg twice daily. For some reasons, aspirin is not on her medication list and this was added today.  2. Essential hypertension: Blood pressure is on the low side with bradycardia. Thus, metoprolol was decreased as outlined above.  3. Hyperlipidemia: The patient has known history of elevated triglycerides and she is currently on fenofibrate. She was started on atorvastatin during her hospitalization but I do not see that on her list. This will have to be clarified upon her follow-up.     Disposition:   FU with me in 3 months  Signed,  Kathlyn Sacramento, MD  10/04/2015 5:14 PM    [facility]

## 2015-10-04 NOTE — Patient Instructions (Signed)
Medication Instructions:  Your physician has recommended you make the following change in your medication:  RESUME 81mg  aspirin once daily DECREASE metoprolol to 25mg  twice daily   Labwork: none  Testing/Procedures: none  Follow-Up: Your physician recommends that you schedule a follow-up appointment in: 3 months with Dr. Fletcher Anon.    Any Other Special Instructions Will Be Listed Below (If Applicable).     If you need a refill on your cardiac medications before your next appointment, please call your pharmacy.

## 2015-12-05 ENCOUNTER — Other Ambulatory Visit: Payer: Self-pay | Admitting: Cardiovascular Disease

## 2015-12-09 ENCOUNTER — Telehealth: Payer: Self-pay | Admitting: Cardiovascular Disease

## 2015-12-09 NOTE — Telephone Encounter (Signed)
Metoprolol Tartrate 25 mg Takes 1 tablet by mouth twice daily.  Spoke with pharmacist to confirm instructions and correct dosage.

## 2015-12-09 NOTE — Telephone Encounter (Signed)
Please call regarding Metoprolol rx.

## 2016-01-03 ENCOUNTER — Ambulatory Visit: Payer: Medicare HMO | Admitting: Cardiovascular Disease

## 2016-04-07 ENCOUNTER — Inpatient Hospital Stay
Admission: EM | Admit: 2016-04-07 | Discharge: 2016-04-11 | DRG: 872 | Disposition: A | Discharge status: Home Health | Payer: Medicare HMO | Attending: Internal Medicine | Admitting: Internal Medicine

## 2016-04-07 DIAGNOSIS — N182 Chronic kidney disease, stage 2 (mild): Secondary | ICD-10-CM | POA: Diagnosis present

## 2016-04-07 DIAGNOSIS — I10 Essential (primary) hypertension: Secondary | ICD-10-CM | POA: Diagnosis present

## 2016-04-07 DIAGNOSIS — Z96649 Presence of unspecified artificial hip joint: Secondary | ICD-10-CM | POA: Diagnosis present

## 2016-04-07 DIAGNOSIS — Z881 Allergy status to other antibiotic agents status: Secondary | ICD-10-CM

## 2016-04-07 DIAGNOSIS — M109 Gout, unspecified: Secondary | ICD-10-CM | POA: Diagnosis present

## 2016-04-07 DIAGNOSIS — Z885 Allergy status to narcotic agent status: Secondary | ICD-10-CM

## 2016-04-07 DIAGNOSIS — E039 Hypothyroidism, unspecified: Secondary | ICD-10-CM | POA: Diagnosis present

## 2016-04-07 DIAGNOSIS — M1990 Unspecified osteoarthritis, unspecified site: Secondary | ICD-10-CM | POA: Diagnosis present

## 2016-04-07 DIAGNOSIS — L03113 Cellulitis of right upper limb: Secondary | ICD-10-CM | POA: Diagnosis present

## 2016-04-07 DIAGNOSIS — Y998 Other external cause status: Secondary | ICD-10-CM

## 2016-04-07 DIAGNOSIS — E785 Hyperlipidemia, unspecified: Secondary | ICD-10-CM | POA: Diagnosis present

## 2016-04-07 DIAGNOSIS — Z794 Long term (current) use of insulin: Secondary | ICD-10-CM

## 2016-04-07 DIAGNOSIS — I252 Old myocardial infarction: Secondary | ICD-10-CM

## 2016-04-07 DIAGNOSIS — E669 Obesity, unspecified: Secondary | ICD-10-CM | POA: Diagnosis present

## 2016-04-07 DIAGNOSIS — Z87891 Personal history of nicotine dependence: Secondary | ICD-10-CM

## 2016-04-07 DIAGNOSIS — W540XXA Bitten by dog, initial encounter: Secondary | ICD-10-CM

## 2016-04-07 DIAGNOSIS — W1830XA Fall on same level, unspecified, initial encounter: Secondary | ICD-10-CM | POA: Diagnosis present

## 2016-04-07 DIAGNOSIS — Z8614 Personal history of Methicillin resistant Staphylococcus aureus infection: Secondary | ICD-10-CM

## 2016-04-07 DIAGNOSIS — F329 Major depressive disorder, single episode, unspecified: Secondary | ICD-10-CM | POA: Diagnosis present

## 2016-04-07 DIAGNOSIS — A4102 Sepsis due to Methicillin resistant Staphylococcus aureus: Secondary | ICD-10-CM | POA: Diagnosis not present

## 2016-04-07 DIAGNOSIS — E1122 Type 2 diabetes mellitus with diabetic chronic kidney disease: Secondary | ICD-10-CM | POA: Diagnosis present

## 2016-04-07 DIAGNOSIS — Z95828 Presence of other vascular implants and grafts: Secondary | ICD-10-CM

## 2016-04-07 DIAGNOSIS — I25810 Atherosclerosis of coronary artery bypass graft(s) without angina pectoris: Secondary | ICD-10-CM | POA: Diagnosis present

## 2016-04-07 DIAGNOSIS — I129 Hypertensive chronic kidney disease with stage 1 through stage 4 chronic kidney disease, or unspecified chronic kidney disease: Secondary | ICD-10-CM | POA: Diagnosis present

## 2016-04-07 DIAGNOSIS — Y92003 Bedroom of unspecified non-institutional (private) residence as the place of occurrence of the external cause: Secondary | ICD-10-CM

## 2016-04-07 DIAGNOSIS — S51851A Open bite of right forearm, initial encounter: Secondary | ICD-10-CM | POA: Diagnosis present

## 2016-04-07 DIAGNOSIS — A419 Sepsis, unspecified organism: Secondary | ICD-10-CM

## 2016-04-07 DIAGNOSIS — Z90710 Acquired absence of both cervix and uterus: Secondary | ICD-10-CM

## 2016-04-07 DIAGNOSIS — S61451A Open bite of right hand, initial encounter: Secondary | ICD-10-CM | POA: Diagnosis present

## 2016-04-07 DIAGNOSIS — Y9389 Activity, other specified: Secondary | ICD-10-CM

## 2016-04-07 DIAGNOSIS — F419 Anxiety disorder, unspecified: Secondary | ICD-10-CM | POA: Diagnosis present

## 2016-04-07 DIAGNOSIS — R262 Difficulty in walking, not elsewhere classified: Secondary | ICD-10-CM

## 2016-04-07 DIAGNOSIS — Z79891 Long term (current) use of opiate analgesic: Secondary | ICD-10-CM

## 2016-04-07 DIAGNOSIS — Z6841 Body Mass Index (BMI) 40.0 and over, adult: Secondary | ICD-10-CM

## 2016-04-07 DIAGNOSIS — W19XXXA Unspecified fall, initial encounter: Secondary | ICD-10-CM

## 2016-04-07 DIAGNOSIS — M7989 Other specified soft tissue disorders: Secondary | ICD-10-CM | POA: Diagnosis present

## 2016-04-07 DIAGNOSIS — B955 Unspecified streptococcus as the cause of diseases classified elsewhere: Secondary | ICD-10-CM | POA: Diagnosis present

## 2016-04-07 DIAGNOSIS — L0390 Cellulitis, unspecified: Secondary | ICD-10-CM | POA: Diagnosis present

## 2016-04-07 DIAGNOSIS — K219 Gastro-esophageal reflux disease without esophagitis: Secondary | ICD-10-CM | POA: Diagnosis present

## 2016-04-08 ENCOUNTER — Encounter: Payer: Self-pay | Admitting: *Deleted

## 2016-04-08 ENCOUNTER — Emergency Department: Payer: Medicare HMO

## 2016-04-08 DIAGNOSIS — M109 Gout, unspecified: Secondary | ICD-10-CM | POA: Diagnosis present

## 2016-04-08 DIAGNOSIS — E669 Obesity, unspecified: Secondary | ICD-10-CM | POA: Diagnosis present

## 2016-04-08 DIAGNOSIS — F329 Major depressive disorder, single episode, unspecified: Secondary | ICD-10-CM | POA: Diagnosis present

## 2016-04-08 DIAGNOSIS — I25810 Atherosclerosis of coronary artery bypass graft(s) without angina pectoris: Secondary | ICD-10-CM | POA: Diagnosis present

## 2016-04-08 DIAGNOSIS — W1830XA Fall on same level, unspecified, initial encounter: Secondary | ICD-10-CM | POA: Diagnosis present

## 2016-04-08 DIAGNOSIS — F419 Anxiety disorder, unspecified: Secondary | ICD-10-CM | POA: Diagnosis present

## 2016-04-08 DIAGNOSIS — E039 Hypothyroidism, unspecified: Secondary | ICD-10-CM | POA: Diagnosis present

## 2016-04-08 DIAGNOSIS — E785 Hyperlipidemia, unspecified: Secondary | ICD-10-CM | POA: Diagnosis present

## 2016-04-08 DIAGNOSIS — A4102 Sepsis due to Methicillin resistant Staphylococcus aureus: Secondary | ICD-10-CM | POA: Diagnosis present

## 2016-04-08 DIAGNOSIS — L0390 Cellulitis, unspecified: Secondary | ICD-10-CM | POA: Diagnosis present

## 2016-04-08 DIAGNOSIS — M1990 Unspecified osteoarthritis, unspecified site: Secondary | ICD-10-CM | POA: Diagnosis present

## 2016-04-08 DIAGNOSIS — Z96649 Presence of unspecified artificial hip joint: Secondary | ICD-10-CM | POA: Diagnosis present

## 2016-04-08 DIAGNOSIS — I129 Hypertensive chronic kidney disease with stage 1 through stage 4 chronic kidney disease, or unspecified chronic kidney disease: Secondary | ICD-10-CM | POA: Diagnosis present

## 2016-04-08 DIAGNOSIS — Z794 Long term (current) use of insulin: Secondary | ICD-10-CM | POA: Diagnosis not present

## 2016-04-08 DIAGNOSIS — M7989 Other specified soft tissue disorders: Secondary | ICD-10-CM | POA: Diagnosis present

## 2016-04-08 DIAGNOSIS — Z8614 Personal history of Methicillin resistant Staphylococcus aureus infection: Secondary | ICD-10-CM | POA: Diagnosis not present

## 2016-04-08 DIAGNOSIS — E1122 Type 2 diabetes mellitus with diabetic chronic kidney disease: Secondary | ICD-10-CM | POA: Diagnosis present

## 2016-04-08 DIAGNOSIS — R7881 Bacteremia: Secondary | ICD-10-CM | POA: Diagnosis not present

## 2016-04-08 DIAGNOSIS — L03113 Cellulitis of right upper limb: Secondary | ICD-10-CM | POA: Diagnosis present

## 2016-04-08 DIAGNOSIS — Y92003 Bedroom of unspecified non-institutional (private) residence as the place of occurrence of the external cause: Secondary | ICD-10-CM | POA: Diagnosis not present

## 2016-04-08 DIAGNOSIS — K219 Gastro-esophageal reflux disease without esophagitis: Secondary | ICD-10-CM | POA: Diagnosis present

## 2016-04-08 DIAGNOSIS — Z90710 Acquired absence of both cervix and uterus: Secondary | ICD-10-CM | POA: Diagnosis not present

## 2016-04-08 DIAGNOSIS — Y9389 Activity, other specified: Secondary | ICD-10-CM | POA: Diagnosis not present

## 2016-04-08 DIAGNOSIS — Y998 Other external cause status: Secondary | ICD-10-CM | POA: Diagnosis not present

## 2016-04-08 DIAGNOSIS — W540XXA Bitten by dog, initial encounter: Secondary | ICD-10-CM | POA: Diagnosis not present

## 2016-04-08 DIAGNOSIS — B955 Unspecified streptococcus as the cause of diseases classified elsewhere: Secondary | ICD-10-CM | POA: Diagnosis present

## 2016-04-08 DIAGNOSIS — Z6841 Body Mass Index (BMI) 40.0 and over, adult: Secondary | ICD-10-CM | POA: Diagnosis not present

## 2016-04-08 DIAGNOSIS — I252 Old myocardial infarction: Secondary | ICD-10-CM | POA: Diagnosis not present

## 2016-04-08 DIAGNOSIS — N182 Chronic kidney disease, stage 2 (mild): Secondary | ICD-10-CM | POA: Diagnosis present

## 2016-04-08 DIAGNOSIS — Z87891 Personal history of nicotine dependence: Secondary | ICD-10-CM | POA: Diagnosis not present

## 2016-04-08 DIAGNOSIS — I10 Essential (primary) hypertension: Secondary | ICD-10-CM | POA: Diagnosis present

## 2016-04-08 LAB — CBC WITH DIFFERENTIAL/PLATELET
BASOS PCT: 1 %
Basophils Absolute: 0.1 10*3/uL (ref 0–0.1)
Eosinophils Absolute: 0 10*3/uL (ref 0–0.7)
Eosinophils Relative: 0 %
HEMATOCRIT: 38.1 % (ref 35.0–47.0)
HEMOGLOBIN: 12.2 g/dL (ref 12.0–16.0)
LYMPHS PCT: 8 %
Lymphs Abs: 1 10*3/uL (ref 1.0–3.6)
MCH: 26.8 pg (ref 26.0–34.0)
MCHC: 32.1 g/dL (ref 32.0–36.0)
MCV: 83.4 fL (ref 80.0–100.0)
MONO ABS: 0.7 10*3/uL (ref 0.2–0.9)
MONOS PCT: 6 %
NEUTROS ABS: 10.1 10*3/uL — AB (ref 1.4–6.5)
NEUTROS PCT: 85 %
Platelets: 258 10*3/uL (ref 150–440)
RBC: 4.57 MIL/uL (ref 3.80–5.20)
RDW: 15.3 % — AB (ref 11.5–14.5)
WBC: 11.7 10*3/uL — ABNORMAL HIGH (ref 3.6–11.0)

## 2016-04-08 LAB — URINALYSIS COMPLETE WITH MICROSCOPIC (ARMC ONLY)
Bilirubin Urine: NEGATIVE
Glucose, UA: 150 mg/dL — AB
Hgb urine dipstick: NEGATIVE
Ketones, ur: NEGATIVE mg/dL
Leukocytes, UA: NEGATIVE
Nitrite: NEGATIVE
Protein, ur: 100 mg/dL — AB
Specific Gravity, Urine: 1.019 (ref 1.005–1.030)
pH: 8 (ref 5.0–8.0)

## 2016-04-08 LAB — BLOOD CULTURE ID PANEL (REFLEXED)
ACINETOBACTER BAUMANNII: NOT DETECTED
CANDIDA PARAPSILOSIS: NOT DETECTED
Candida albicans: NOT DETECTED
Candida glabrata: NOT DETECTED
Candida krusei: NOT DETECTED
Candida tropicalis: NOT DETECTED
ENTEROCOCCUS SPECIES: NOT DETECTED
Enterobacter cloacae complex: NOT DETECTED
Enterobacteriaceae species: NOT DETECTED
Escherichia coli: NOT DETECTED
HAEMOPHILUS INFLUENZAE: NOT DETECTED
KLEBSIELLA OXYTOCA: NOT DETECTED
Klebsiella pneumoniae: NOT DETECTED
Listeria monocytogenes: NOT DETECTED
METHICILLIN RESISTANCE: DETECTED — AB
Neisseria meningitidis: NOT DETECTED
PSEUDOMONAS AERUGINOSA: NOT DETECTED
Proteus species: NOT DETECTED
SERRATIA MARCESCENS: NOT DETECTED
STAPHYLOCOCCUS AUREUS BCID: DETECTED — AB
STREPTOCOCCUS PNEUMONIAE: NOT DETECTED
Staphylococcus species: DETECTED — AB
Streptococcus agalactiae: NOT DETECTED
Streptococcus pyogenes: DETECTED — AB
Streptococcus species: DETECTED — AB

## 2016-04-08 LAB — COMPREHENSIVE METABOLIC PANEL
ALBUMIN: 3.5 g/dL (ref 3.5–5.0)
ALT: 16 U/L (ref 14–54)
ANION GAP: 9 (ref 5–15)
AST: 19 U/L (ref 15–41)
Alkaline Phosphatase: 74 U/L (ref 38–126)
BUN: 12 mg/dL (ref 6–20)
CHLORIDE: 101 mmol/L (ref 101–111)
CO2: 25 mmol/L (ref 22–32)
Calcium: 8.7 mg/dL — ABNORMAL LOW (ref 8.9–10.3)
Creatinine, Ser: 1.03 mg/dL — ABNORMAL HIGH (ref 0.44–1.00)
GFR calc Af Amer: >60 mL/min (ref >60)
GFR calc non Af Amer: 55 mL/min — ABNORMAL LOW (ref >60)
GLUCOSE: 221 mg/dL — AB (ref 65–99)
POTASSIUM: 3.9 mmol/L (ref 3.5–5.1)
SODIUM: 135 mmol/L (ref 135–145)
TOTAL PROTEIN: 6.9 g/dL (ref 6.5–8.1)
Total Bilirubin: 0.1 mg/dL — ABNORMAL LOW (ref 0.3–1.2)

## 2016-04-08 LAB — CBC
HCT: 34.3 % — ABNORMAL LOW (ref 35.0–47.0)
Hemoglobin: 11.1 g/dL — ABNORMAL LOW (ref 12.0–16.0)
MCH: 27.2 pg (ref 26.0–34.0)
MCHC: 32.4 g/dL (ref 32.0–36.0)
MCV: 84 fL (ref 80.0–100.0)
PLATELETS: 226 10*3/uL (ref 150–440)
RBC: 4.08 MIL/uL (ref 3.80–5.20)
RDW: 15.4 % — ABNORMAL HIGH (ref 11.5–14.5)
WBC: 10.9 10*3/uL (ref 3.6–11.0)

## 2016-04-08 LAB — GLUCOSE, CAPILLARY: GLUCOSE-CAPILLARY: 220 mg/dL — AB (ref 65–99)

## 2016-04-08 LAB — BASIC METABOLIC PANEL
Anion gap: 9 (ref 5–15)
BUN: 13 mg/dL (ref 6–20)
CALCIUM: 8.1 mg/dL — AB (ref 8.9–10.3)
CO2: 23 mmol/L (ref 22–32)
CREATININE: 1.03 mg/dL — AB (ref 0.44–1.00)
Chloride: 104 mmol/L (ref 101–111)
GFR, EST NON AFRICAN AMERICAN: 55 mL/min — AB (ref >60)
Glucose, Bld: 223 mg/dL — ABNORMAL HIGH (ref 65–99)
Potassium: 3.4 mmol/L — ABNORMAL LOW (ref 3.5–5.1)
SODIUM: 136 mmol/L (ref 135–145)

## 2016-04-08 LAB — MRSA PCR SCREENING: MRSA BY PCR: POSITIVE — AB

## 2016-04-08 LAB — LACTIC ACID, PLASMA
LACTIC ACID, VENOUS: 2.4 mmol/L — AB (ref 0.5–1.9)
Lactic Acid, Venous: 2.2 mmol/L (ref 0.5–1.9)

## 2016-04-08 MED ORDER — SODIUM CHLORIDE 0.9 % IV SOLN
INTRAVENOUS | Status: DC
  Administered 2016-04-08 – 2016-04-10 (×3): via INTRAVENOUS

## 2016-04-08 MED ORDER — METOPROLOL TARTRATE 25 MG PO TABS
25.0000 mg | ORAL_TABLET | Freq: Two times a day (BID) | ORAL | Status: DC
  Administered 2016-04-08 – 2016-04-11 (×8): 25 mg via ORAL
  Filled 2016-04-08 (×9): qty 1

## 2016-04-08 MED ORDER — ACETAMINOPHEN 325 MG PO TABS
650.0000 mg | ORAL_TABLET | Freq: Four times a day (QID) | ORAL | Status: DC | PRN
  Administered 2016-04-08 – 2016-04-10 (×4): 650 mg via ORAL
  Filled 2016-04-08 (×5): qty 2

## 2016-04-08 MED ORDER — LORATADINE 10 MG PO TABS
10.0000 mg | ORAL_TABLET | Freq: Every day | ORAL | Status: DC
Start: 2016-04-08 — End: 2016-04-11
  Administered 2016-04-08 – 2016-04-11 (×4): 10 mg via ORAL
  Filled 2016-04-08 (×4): qty 1

## 2016-04-08 MED ORDER — PIPERACILLIN-TAZOBACTAM 4.5 G IVPB
4.5000 g | Freq: Three times a day (TID) | INTRAVENOUS | Status: DC
  Administered 2016-04-08 (×2): 4.5 g via INTRAVENOUS
  Filled 2016-04-08 (×4): qty 100

## 2016-04-08 MED ORDER — TRAMADOL HCL 50 MG PO TABS
50.0000 mg | ORAL_TABLET | Freq: Four times a day (QID) | ORAL | Status: DC | PRN
  Administered 2016-04-08: 50 mg via ORAL
  Filled 2016-04-08: qty 1

## 2016-04-08 MED ORDER — ENOXAPARIN SODIUM 40 MG/0.4ML ~~LOC~~ SOLN
40.0000 mg | Freq: Two times a day (BID) | SUBCUTANEOUS | Status: DC
  Administered 2016-04-08 – 2016-04-11 (×6): 40 mg via SUBCUTANEOUS
  Filled 2016-04-08 (×5): qty 0.4

## 2016-04-08 MED ORDER — LEVOTHYROXINE SODIUM 100 MCG PO TABS
100.0000 ug | ORAL_TABLET | Freq: Every day | ORAL | Status: DC
  Administered 2016-04-08 – 2016-04-11 (×4): 100 ug via ORAL
  Filled 2016-04-08 (×4): qty 1

## 2016-04-08 MED ORDER — BISACODYL 5 MG PO TBEC: 5.0000 mg | DELAYED_RELEASE_TABLET | Freq: Every day | ORAL | Status: DC | PRN

## 2016-04-08 MED ORDER — PIPERACILLIN-TAZOBACTAM 3.375 G IVPB 30 MIN
3.3750 g | Freq: Once | INTRAVENOUS | Status: AC
  Administered 2016-04-08: 3.375 g via INTRAVENOUS
  Filled 2016-04-08: qty 50

## 2016-04-08 MED ORDER — METFORMIN HCL 500 MG PO TABS
1000.0000 mg | ORAL_TABLET | Freq: Two times a day (BID) | ORAL | Status: DC
  Administered 2016-04-08 – 2016-04-11 (×7): 1000 mg via ORAL
  Filled 2016-04-08 (×7): qty 2

## 2016-04-08 MED ORDER — ACETAMINOPHEN 650 MG RE SUPP: 650.0000 mg | Freq: Four times a day (QID) | RECTAL | Status: DC | PRN

## 2016-04-08 MED ORDER — FENOFIBRATE 54 MG PO TABS
54.0000 mg | ORAL_TABLET | Freq: Every day | ORAL | Status: DC
  Administered 2016-04-08 – 2016-04-11 (×4): 54 mg via ORAL
  Filled 2016-04-08 (×4): qty 1

## 2016-04-08 MED ORDER — CITALOPRAM HYDROBROMIDE 20 MG PO TABS
40.0000 mg | ORAL_TABLET | Freq: Every day | ORAL | Status: DC
  Administered 2016-04-08 – 2016-04-11 (×4): 40 mg via ORAL
  Filled 2016-04-08 (×4): qty 2

## 2016-04-08 MED ORDER — SITAGLIPTIN PHOS-METFORMIN HCL 50-1000 MG PO TABS: 1.0000 | ORAL_TABLET | Freq: Two times a day (BID) | ORAL | Status: DC

## 2016-04-08 MED ORDER — TRAZODONE HCL 100 MG PO TABS
100.0000 mg | ORAL_TABLET | Freq: Every day | ORAL | Status: DC
  Administered 2016-04-08 – 2016-04-10 (×4): 100 mg via ORAL
  Filled 2016-04-08 (×3): qty 1

## 2016-04-08 MED ORDER — INSULIN GLARGINE 300 UNIT/ML ~~LOC~~ SOPN: 35.0000 [IU] | PEN_INJECTOR | Freq: Every day | SUBCUTANEOUS | Status: DC

## 2016-04-08 MED ORDER — SODIUM CHLORIDE 0.9 % IV BOLUS (SEPSIS)
1000.0000 mL | Freq: Once | INTRAVENOUS | Status: AC
  Administered 2016-04-08: 1000 mL via INTRAVENOUS

## 2016-04-08 MED ORDER — VANCOMYCIN HCL IN DEXTROSE 1-5 GM/200ML-% IV SOLN
1000.0000 mg | Freq: Once | INTRAVENOUS | Status: AC
  Administered 2016-04-08: 1000 mg via INTRAVENOUS
  Filled 2016-04-08: qty 200

## 2016-04-08 MED ORDER — ONDANSETRON HCL 4 MG/2ML IJ SOLN: 4.0000 mg | Freq: Four times a day (QID) | INTRAMUSCULAR | Status: DC | PRN

## 2016-04-08 MED ORDER — LISINOPRIL 20 MG PO TABS
20.0000 mg | ORAL_TABLET | Freq: Every day | ORAL | Status: DC
Start: 2016-04-08 — End: 2016-04-11
  Administered 2016-04-08 – 2016-04-11 (×4): 20 mg via ORAL
  Filled 2016-04-08 (×4): qty 1

## 2016-04-08 MED ORDER — VANCOMYCIN HCL IN DEXTROSE 1-5 GM/200ML-% IV SOLN
1000.0000 mg | Freq: Two times a day (BID) | INTRAVENOUS | Status: DC
  Administered 2016-04-08 – 2016-04-11 (×7): 1000 mg via INTRAVENOUS
  Filled 2016-04-08 (×9): qty 200

## 2016-04-08 MED ORDER — IBUPROFEN 600 MG PO TABS
600.0000 mg | ORAL_TABLET | Freq: Once | ORAL | Status: AC
  Administered 2016-04-08: 600 mg via ORAL
  Filled 2016-04-08: qty 1

## 2016-04-08 MED ORDER — ONDANSETRON HCL 4 MG PO TABS
4.0000 mg | ORAL_TABLET | Freq: Four times a day (QID) | ORAL | Status: DC | PRN
  Administered 2016-04-09 – 2016-04-10 (×2): 4 mg via ORAL
  Filled 2016-04-08 (×2): qty 1

## 2016-04-08 MED ORDER — PIPERACILLIN-TAZOBACTAM 3.375 G IVPB 30 MIN
3.3750 g | Freq: Once | INTRAVENOUS | Status: DC
  Filled 2016-04-08: qty 50

## 2016-04-08 MED ORDER — CLOPIDOGREL BISULFATE 75 MG PO TABS
75.0000 mg | ORAL_TABLET | Freq: Every day | ORAL | Status: DC
  Administered 2016-04-08 – 2016-04-11 (×4): 75 mg via ORAL
  Filled 2016-04-08 (×4): qty 1

## 2016-04-08 MED ORDER — ACETAMINOPHEN 325 MG PO TABS
650.0000 mg | ORAL_TABLET | Freq: Once | ORAL | Status: AC
  Administered 2016-04-08: 650 mg via ORAL
  Filled 2016-04-08: qty 2

## 2016-04-08 MED ORDER — INSULIN GLARGINE 100 UNIT/ML ~~LOC~~ SOLN
35.0000 [IU] | Freq: Every day | SUBCUTANEOUS | Status: DC
  Administered 2016-04-08 – 2016-04-11 (×4): 35 [IU] via SUBCUTANEOUS
  Filled 2016-04-08 (×5): qty 0.35

## 2016-04-08 MED ORDER — CLINDAMYCIN PHOSPHATE 600 MG/50ML IV SOLN
600.0000 mg | Freq: Three times a day (TID) | INTRAVENOUS | Status: DC
  Administered 2016-04-08 – 2016-04-10 (×7): 600 mg via INTRAVENOUS
  Filled 2016-04-08 (×9): qty 50

## 2016-04-08 MED ORDER — SENNOSIDES-DOCUSATE SODIUM 8.6-50 MG PO TABS: 1.0000 | ORAL_TABLET | Freq: Every evening | ORAL | Status: DC | PRN

## 2016-04-08 MED ORDER — LINAGLIPTIN 5 MG PO TABS
5.0000 mg | ORAL_TABLET | Freq: Every day | ORAL | Status: DC
  Administered 2016-04-08 – 2016-04-11 (×4): 5 mg via ORAL
  Filled 2016-04-08 (×4): qty 1

## 2016-04-08 MED ORDER — VANCOMYCIN HCL IN DEXTROSE 1-5 GM/200ML-% IV SOLN
1000.0000 mg | Freq: Once | INTRAVENOUS | Status: DC
  Filled 2016-04-08: qty 200

## 2016-04-08 NOTE — Progress Notes (Signed)
[facility] [place] NAME: Anita Small    MR#:  [id number]  DATE OF BIRTH:  Sep 15, 1950  SUBJECTIVE:   Came with erythema over the right arm after her dog bit her. Fever 101.4 today at 4 am REVIEW OF SYSTEMS:   Review of Systems  Constitutional: Positive for fever. Negative for chills and weight loss.  HENT: Negative for ear discharge, ear pain and nosebleeds.   Eyes: Negative for blurred vision, pain and discharge.  Respiratory: Negative for sputum production, shortness of breath, wheezing and stridor.   Cardiovascular: Negative for chest pain, palpitations, orthopnea and PND.  Gastrointestinal: Negative for abdominal pain, diarrhea, nausea and vomiting.  Genitourinary: Negative for frequency and urgency.  Musculoskeletal: Negative for back pain and joint pain.  Skin: Positive for rash.       cellulitis right arm with scabs over the arm  Neurological: Positive for weakness. Negative for sensory change, speech change and focal weakness.  Psychiatric/Behavioral: Negative for depression and hallucinations. The patient is not nervous/anxious.    Tolerating Diet:yes Tolerating PT: ambulatory  DRUG ALLERGIES:   Allergies  Allergen Reactions  . Codeine Phosphate Other (See Comments)    REACTION: unspecified  . Duloxetine Other (See Comments)    REACTION: unspecified  . Meperidine Hcl Other (See Comments)    REACTION: unspecified  . Norco [Hydrocodone-Acetaminophen] Other (See Comments)    hallucinations  . Venlafaxine Other (See Comments)    REACTION: unspecified  . Doxycycline Rash    VITALS:  Blood pressure (!) 152/51, pulse 81, temperature 98.4 F (36.9 C), temperature source Oral, resp. rate 18, height 5\' 2"  (1.575 m), weight 119.4 kg (263 lb 4.8 oz), SpO2 94 %.  PHYSICAL EXAMINATION:   Physical Exam  GENERAL:  65 y.o.-year-old patient lying in the bed with no acute distress.  EYES: Pupils equal, round, reactive  to light and accommodation. No scleral icterus. Extraocular muscles intact.  HEENT: Head atraumatic, normocephalic. Oropharynx and nasopharynx clear.  NECK:  Supple, no jugular venous distention. No thyroid enlargement, no tenderness.  LUNGS: Normal breath sounds bilaterally, no wheezing, rales, rhonchi. No use of accessory muscles of respiration.  CARDIOVASCULAR: S1, S2 normal. No murmurs, rubs, or gallops.  ABDOMEN: Soft, nontender, nondistended. Bowel sounds present. No organomegaly or mass.  EXTREMITIES: No cyanosis, clubbing or edema b/l.   Right arm/hand cellulitis with dried scabs over the forearm and swelling of the hand. Good radial pulse NEUROLOGIC: Cranial nerves II through XII are intact. No focal Motor or sensory deficits b/l.   PSYCHIATRIC:  patient is alert and oriented x 3.  SKIN: No obvious rash, lesion, or ulcer.   LABORATORY PANEL:  CBC  Recent Labs Lab 04/08/16 0531  WBC 10.9  HGB 11.1*  HCT 34.3*  PLT 226    Chemistries   Recent Labs Lab 04/08/16 0021 04/08/16 0531  NA 135 136  K 3.9 3.4*  CL 101 104  CO2 25 23  GLUCOSE 221* 223*  BUN 12 13  CREATININE 1.03* 1.03*  CALCIUM 8.7* 8.1*  AST 19  --   ALT 16  --   ALKPHOS 74  --   BILITOT 0.1*  --    Cardiac Enzymes No results for input(s): TROPONINI in the last 168 hours. RADIOLOGY:  Dg Chest 2 View  Result Date: 04/08/2016 CLINICAL DATA:  Status post fall, with fever.  Initial encounter. EXAM: CHEST  2 VIEW COMPARISON:  Chest radiograph performed 07/28/2015, and CTA of the  chest performed 07/30/2015 FINDINGS: The lungs are well-aerated. Mild vascular congestion is noted. There is no evidence of focal opacification, pleural effusion or pneumothorax. The heart is borderline normal in size. No acute osseous abnormalities are seen. There is chronic deformity of the left humeral head. IMPRESSION: Mild vascular congestion noted. Lungs remain grossly clear. No displaced rib fracture seen. Electronically  Signed   By: Garald Balding M.D.   On: 04/08/2016 02:18   Dg Lumbar Spine 2-3 Views  Result Date: 04/08/2016 CLINICAL DATA:  Status post fall, with midline lower back pain. Initial encounter. EXAM: LUMBAR SPINE - 2-3 VIEW COMPARISON:  None. FINDINGS: There is no evidence of fracture or subluxation. Vertebral bodies demonstrate normal height and alignment. Intervertebral disc spaces are preserved. Facet disease is noted at the lower lumbar spine. Mild degenerative change is noted at the lower thoracic and lower lumbar spine. The visualized bowel gas pattern is unremarkable in appearance; air and stool are noted within the colon. The sacroiliac joints are within normal limits. Clips are noted within the right upper quadrant, reflecting prior cholecystectomy. Diffuse calcification is seen along the abdominal aorta. IMPRESSION: 1. No evidence of fracture or subluxation along the lumbar spine. 2. Mild degenerative change at the lower thoracic and lower lumbar spine. 3. Diffuse aortic atherosclerosis noted. Electronically Signed   By: Garald Balding M.D.   On: 04/08/2016 02:20   Ct Head Wo Contrast  Result Date: 04/08/2016 CLINICAL DATA:  Post unwitnessed fall. EXAM: CT HEAD WITHOUT CONTRAST CT CERVICAL SPINE WITHOUT CONTRAST TECHNIQUE: Multidetector CT imaging of the head and cervical spine was performed following the standard protocol without intravenous contrast. Multiplanar CT image reconstructions of the cervical spine were also generated. COMPARISON:  Head CT 03/16/2015. No dedicated cervical spine imaging, CT angiography of the neck 06/11/2014 reviewed. FINDINGS: CT HEAD FINDINGS Brain: No evidence of acute infarction, hemorrhage, hydrocephalus, extra-axial collection or mass lesion/mass effect. Unchanged encephalomalacia in the right occipital lobe. Unchanged chronic small vessel ischemia. Remote appearing lacunar infarct in the left cerebellum. Vascular: Atherosclerosis of skullbase vasculature without  hyperdense vessel. Skull: Normal. Negative for fracture or focal lesion. Sinuses/Orbits: No acute finding. Other: None. CT CERVICAL SPINE FINDINGS Alignment: Straightening of normal lordosis minimal anterolisthesis of C2 on C3 appears stable and degenerative. Minimal anterolisthesis of C6 on C7 appears stable and degenerative. No traumatic subluxation. Skull base and vertebrae: Bony fusion of C4 on C5 may be congenital or postsurgical. No acute fracture. Skullbase is intact. Soft tissues and spinal canal: No prevertebral fluid or swelling. No visible canal hematoma. Disc levels: Absent C4-C5 disc space, may be congenital or postsurgical. Disc space narrowing at C3-C4 with large peripherally calcified posterior disc osteophyte complex versus ossification of the posterior longitudinal ligament. Disc space narrowing at C5-C6 with similar ossified disc osteophyte complex. Additional milder disc space narrowing throughout. There is multilevel facet arthropathy, advanced. Left neural foraminal stenosis is multifactorial at C3-C4. Bony canal narrowing at C5-C6 secondary to peripherally ossified disc osteophyte complex. These findings are all similar to prior CT angiography. Upper chest: No acute abnormality. Other: None. IMPRESSION: 1. No acute intracranial abnormality. Stable remote and chronic ischemia. 2. Advanced multilevel degenerative change throughout the cervical spine with degenerative disc disease and facet arthropathy. No evidence of acute fracture or subluxation. Electronically Signed   By: Jeb Levering M.D.   On: 04/08/2016 01:45   Ct Cervical Spine Wo Contrast  Result Date: 04/08/2016 CLINICAL DATA:  Post unwitnessed fall. EXAM: CT HEAD WITHOUT CONTRAST CT CERVICAL  SPINE WITHOUT CONTRAST TECHNIQUE: Multidetector CT imaging of the head and cervical spine was performed following the standard protocol without intravenous contrast. Multiplanar CT image reconstructions of the cervical spine were also  generated. COMPARISON:  Head CT 03/16/2015. No dedicated cervical spine imaging, CT angiography of the neck 06/11/2014 reviewed. FINDINGS: CT HEAD FINDINGS Brain: No evidence of acute infarction, hemorrhage, hydrocephalus, extra-axial collection or mass lesion/mass effect. Unchanged encephalomalacia in the right occipital lobe. Unchanged chronic small vessel ischemia. Remote appearing lacunar infarct in the left cerebellum. Vascular: Atherosclerosis of skullbase vasculature without hyperdense vessel. Skull: Normal. Negative for fracture or focal lesion. Sinuses/Orbits: No acute finding. Other: None. CT CERVICAL SPINE FINDINGS Alignment: Straightening of normal lordosis minimal anterolisthesis of C2 on C3 appears stable and degenerative. Minimal anterolisthesis of C6 on C7 appears stable and degenerative. No traumatic subluxation. Skull base and vertebrae: Bony fusion of C4 on C5 may be congenital or postsurgical. No acute fracture. Skullbase is intact. Soft tissues and spinal canal: No prevertebral fluid or swelling. No visible canal hematoma. Disc levels: Absent C4-C5 disc space, may be congenital or postsurgical. Disc space narrowing at C3-C4 with large peripherally calcified posterior disc osteophyte complex versus ossification of the posterior longitudinal ligament. Disc space narrowing at C5-C6 with similar ossified disc osteophyte complex. Additional milder disc space narrowing throughout. There is multilevel facet arthropathy, advanced. Left neural foraminal stenosis is multifactorial at C3-C4. Bony canal narrowing at C5-C6 secondary to peripherally ossified disc osteophyte complex. These findings are all similar to prior CT angiography. Upper chest: No acute abnormality. Other: None. IMPRESSION: 1. No acute intracranial abnormality. Stable remote and chronic ischemia. 2. Advanced multilevel degenerative change throughout the cervical spine with degenerative disc disease and facet arthropathy. No evidence of  acute fracture or subluxation. Electronically Signed   By: Jeb Levering M.D.   On: 04/08/2016 01:45   Dg Shoulder Left  Result Date: 04/08/2016 CLINICAL DATA:  Status post fall, with left arm pain. Initial encounter. EXAM: LEFT SHOULDER - 2+ VIEW COMPARISON:  CT of the left shoulder performed 07/28/2015 FINDINGS: There is chronic deformity of the proximal left humerus. No fracture is seen. The glenoid is grossly unremarkable. The left acromioclavicular joint is grossly unremarkable. Diffuse soft tissue swelling is noted. 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  No fracture seen. Electronically Signed   By: Garald Balding M.D.   On: 04/08/2016 02:22   Dg Hand Complete Right  Result Date: 04/08/2016 CLINICAL DATA:  Status post fall, with right hand pain, erythema and swelling. Initial encounter. EXAM: RIGHT HAND - COMPLETE 3+ VIEW COMPARISON:  None. FINDINGS: There is no evidence of fracture or dislocation. Minimal degenerative change is noted at the fourth distal interphalangeal joint, with slight sclerosis. The carpal rows are intact, and demonstrate normal alignment. The soft tissues are unremarkable in appearance. IMPRESSION: No evidence of fracture or dislocation. Electronically Signed   By: Garald Balding M.D.   On: 04/08/2016 02:22   ASSESSMENT AND PLAN:  65 year old  female patient with history of coronary artery disease, CABG, hypertension, hyperlipidemia, type 2 diabetes mellitus, hypothyroidism presented to the emergency room for fall and in swelling and redness of the right hand secondary to dog bite.  1. Cellulitis of right hand and right forearm s/p dog bite -IV vanc and zosyn -pending BC -fever this am of 101.4 -MRSA PCR ordered. If negative then change to IV unasyn  2. Sepsis secondary to cellulitis -fever,low bp and elevated LA  3. Type 2 diabetes mellitus -  cont home dose Insulin and Janumet  4. Coronary artery disease -cont plavix  5.  Hypertension -metoprolol,lisinorpil,HCTZ  6.HL On tricor  Case discussed with Care Management/Social Worker. Management plans discussed with the patient, family and they are in agreement.  CODE STATUS: FULL DVT Prophylaxis: lvoenox TOTAL TIME TAKING CARE OF THIS PATIENT: 30 minutes.  >50% time spent on counselling and coordination of care with pt  POSSIBLE D/C IN 1-2 DAYS, DEPENDING ON CLINICAL CONDITION.  Note: This dictation was prepared with Dragon dictation along with smaller phrase technology. Any transcriptional errors that result from this process are unintentional.  Terrill Wauters M.D on 04/08/2016 at 8:04 AM  Between 7am to 6pm - Pager - [phone]  After 6pm go to [email or web address] - password [email or web address]  Office  [phone]  CC: Primary care physician; Lenard Simmer, MD

## 2016-04-08 NOTE — Care Management Important Message (Signed)
Important Message  Patient Details  Name: Rodneisha Tawater MRN: [id number] Date of Birth: [birth year]   Medicare Important Message Given:  Yes    Farhaan Mabee A, RN 04/08/2016, 2:18 PM

## 2016-04-08 NOTE — Progress Notes (Addendum)
Pharmacy Antibiotic Note  Anita Small is a 65 y.o. female admitted on 04/07/2016 with sepsis and cellulitis.  Pharmacy has been consulted for Zosyn and vancomycin dosing.  Plan: 1. Zosyn 3.375 gm IV x 1 in ED followed in 6 hours by Zosyn 4.5 gm IV Q8H EI for BMI > 40 2. Vancomycin 1 gm IV x 1 followed in 6 hours by vancomycin 1gm IV Q12H, predicted trough 19 mcg/mL. Pharmacy will continue to follow and adjust as needed to maintain trough 15 to 20 mcg/mL.   Vd 53.6 L, Ke 0.058 hr-1, T1/2 12 hr  Height: 5\' 2"  (157.5 cm) Weight: 258 lb (117 kg) IBW/kg (Calculated) : 50.1  Temp (24hrs), Avg:100.6 F (38.1 C), Min:100.6 F (38.1 C), Max:100.6 F (38.1 C)   Recent Labs Lab 04/08/16 0021  WBC 11.7*  CREATININE 1.03*  LATICACIDVEN 2.2*    Estimated Creatinine Clearance: 64.3 mL/min (by C-G formula based on SCr of 1.03 mg/dL (H)).    Allergies  Allergen Reactions  . Codeine Phosphate Other (See Comments)    REACTION: unspecified  . Duloxetine Other (See Comments)    REACTION: unspecified  . Meperidine Hcl Other (See Comments)    REACTION: unspecified  . Norco [Hydrocodone-Acetaminophen] Other (See Comments)    hallucinations  . Venlafaxine Other (See Comments)    REACTION: unspecified  . Doxycycline Rash     Thank you for allowing pharmacy to be a part of this patient's care.  Laural Benes, Pharm.D, BCPS Clinical Pharmacist 04/08/2016 2:00 AM

## 2016-04-08 NOTE — H&P (Addendum)
[facility] NAME: Anita Small    MR#:  [id number]  DATE OF BIRTH:  24-Jun-1951  DATE OF ADMISSION:  04/07/2016  PRIMARY CARE PHYSICIAN: Lenard Simmer, MD   REQUESTING/REFERRING PHYSICIAN:   CHIEF COMPLAINT:   Chief Complaint  Patient presents with  . Fall    HISTORY OF PRESENT ILLNESS: Anita Small  is a 65 y.o. female with a known history of Arthritis, coronary artery disease, CABG, type 2 diabetes mellitus, hypertension, hyperlipidemia, GERD, hypothyroidism presented to the emergency room with redness swelling of the right hand and forearm. Patient had a fall yesterday but did not lose any consciousness. No history of any head injury. She has a multiple dog bite over the dorsum of the right hand and right forearm. Patient has a [place] dog and when she was playing with that the dog bit her hand and forearm. She has multiple puncture wounds over the right hand and forearm with swelling and redness. Pain is present in the right hand and forearm which is aching in nature 7 out of 10 on a scale of 1-10. No complaints of any fever or chills. Patient was worked up with CT head, CT scan of the cervical spine and x-ray of the lumbar spine which showed no fractures. Hospitalist service was consulted for further care of the patient. No complaints of chest pain, shortness of breath and palpitations.  PAST MEDICAL HISTORY:   Past Medical History:  Diagnosis Date  . Adenomatous polyp 1999  . Anxiety   . Arthritis    Left knee  . CAD (coronary artery disease) of artery bypass graft    a. NSTEMI in 07/2015: Cath showed 99% stenosis of distal LCx  --> DES placed  . Depression    Paranoid tendencies  . Diabetes mellitus    Type II  . Diverticulosis   . GERD (gastroesophageal reflux disease)   . Hyperlipidemia   . Hypertension   . Hypothyroidism   . Obesity     PAST SURGICAL HISTORY: Past Surgical History:  Procedure  Laterality Date  . ABDOMINAL HYSTERECTOMY  1977   One ovary left  . APPENDECTOMY    . BLADDER REPAIR  1977   Tacking  . CARDIAC CATHETERIZATION N/A 08/01/2015   Procedure: Left Heart Cath and Coronary Angiography;  Surgeon: Wellington Hampshire, MD;  Location: [facility];  Service: Cardiovascular;  Laterality: N/A;  . CARDIAC CATHETERIZATION N/A 08/01/2015   Procedure: Coronary Stent Intervention;  Surgeon: Wellington Hampshire, MD;  Location: [facility] [place] [facility];  Service: Cardiovascular;  Laterality: N/A;  . CARDIOVASCULAR STRESS TEST  7/00   negative. No cardiolite  . CAROTID ENDARTERECTOMY  2/11   Dr Jamal Collin  . [place]  . CHOLECYSTECTOMY    . CT ABD W & PELVIS WO CM  2002   Negative. Pituitary normal on MRI  . HEEL SPUR SURGERY  03/03   Left  . INTRAMEDULLARY (IM) NAIL INTERTROCHANTERIC Left 08/04/2015   Procedure: INTRAMEDULLARY (IM) NAIL INTERTROCHANTRIC;  Surgeon: Hessie Knows, MD;  Location: [facility];  Service: Orthopedics;  Laterality: Left;  . KNEE ARTHROSCOPY  2001   Left   Tamala Julian)  . TOTAL KNEE ARTHROPLASTY  11/12   left--Dr Tamala Julian  . TRANSTHORACIC ECHOCARDIOGRAM  3/04   normal    SOCIAL HISTORY:  Social History  Substance Use Topics  . Smoking status: Former Smoker    Types: Cigarettes  . Smokeless tobacco:  Never Used  . Alcohol use Yes     Comment: rarely    FAMILY HISTORY:  Family History  Problem Relation Age of Onset  . Cancer Mother     Breast  . Heart disease Mother   . Diabetes Mother   . Colon polyps Mother   . Heart disease Father   . Diabetes Father   . Hypertension Sister   . Diabetes Sister   . Depression Daughter   . Cancer Paternal Grandmother     ? throat CA  . Colon cancer      Unsure if mother had it     DRUG ALLERGIES:  Allergies  Allergen Reactions  . Codeine Phosphate Other (See Comments)    REACTION: unspecified  . Duloxetine Other (See Comments)    REACTION: unspecified  . Meperidine Hcl Other (See  Comments)    REACTION: unspecified  . Norco [Hydrocodone-Acetaminophen] Other (See Comments)    hallucinations  . Venlafaxine Other (See Comments)    REACTION: unspecified  . Doxycycline Rash    REVIEW OF SYSTEMS:   CONSTITUTIONAL: No fever, fatigue or weakness.  EYES: No blurred or double vision.  EARS, NOSE, AND THROAT: No tinnitus or ear pain.  RESPIRATORY: No cough, shortness of breath, wheezing or hemoptysis.  CARDIOVASCULAR: No chest pain, orthopnea, edema.  GASTROINTESTINAL: No nausea, vomiting, diarrhea or abdominal pain.  GENITOURINARY: No dysuria, hematuria.  ENDOCRINE: No polyuria, nocturia,  HEMATOLOGY: No anemia, easy bruising or bleeding SKIN: Has redness and wounds over right hand and right fore arm MUSCULOSKELETAL: Pain over right hand and right fore arm. NEUROLOGIC: No tingling, numbness, weakness.  PSYCHIATRY: No anxiety or depression.   MEDICATIONS AT HOME:  Prior to Admission medications   Medication Sig Start Date End Date Taking? Authorizing Provider  bisacodyl (DULCOLAX) 5 MG EC tablet Take 1 tablet (5 mg total) by mouth daily as needed for moderate constipation. 08/08/15  Yes Vaughan Basta, MD  cetirizine (ZYRTEC) 10 MG tablet Take 10 mg by mouth at bedtime.   Yes Historical Provider, MD  citalopram (CELEXA) 40 MG tablet Take 40 mg by mouth daily.   Yes Historical Provider, MD  clopidogrel (PLAVIX) 75 MG tablet Take 1 tablet (75 mg total) by mouth daily with breakfast. 08/08/15  Yes Vaughan Basta, MD  fenofibrate (TRICOR) 145 MG tablet Take 145 mg by mouth daily.   Yes Historical Provider, MD  hydrochlorothiazide (HYDRODIURIL) 25 MG tablet Take 25 mg by mouth daily.   Yes Historical Provider, MD  Insulin Glargine (TOUJEO SOLOSTAR) 300 UNIT/ML SOPN Inject 35 Units into the skin daily.   Yes Historical Provider, MD  levothyroxine (SYNTHROID, LEVOTHROID) 100 MCG tablet Take 1 tablet (100 mcg total) by mouth daily. 02/25/14  Yes Venia Carbon, MD   lisinopril (PRINIVIL,ZESTRIL) 20 MG tablet Take 1 tablet (20 mg total) by mouth daily. Take everyday with HCTZ Patient taking differently: Take 20 mg by mouth daily.  02/25/14  Yes Venia Carbon, MD  metoprolol tartrate (LOPRESSOR) 25 MG tablet TAKE 1 TABLET TWICE DAILY 12/05/15  Yes Wellington Hampshire, MD  sitaGLIPtin-metformin (JANUMET) 50-1000 MG per tablet Take 1 tablet by mouth 2 (two) times daily with a meal. 02/25/14  Yes Venia Carbon, MD  traZODone (DESYREL) 100 MG tablet Take 1 tablet (100 mg total) by mouth at bedtime. Patient taking differently: Take 100 mg by mouth at bedtime as needed for sleep.  08/08/15  Yes Vaughan Basta, MD      PHYSICAL EXAMINATION:  VITAL SIGNS: Blood pressure (!) 108/98, pulse 88, temperature (!) 100.6 F (38.1 C), temperature source Oral, resp. rate (!) 24, height 5\' 2"  (1.575 m), weight 117 kg (258 lb), SpO2 95 %.  GENERAL:  65 y.o.-year-old obese patient lying in the bed with no acute distress.  EYES: Pupils equal, round, reactive to light and accommodation. No scleral icterus. Extraocular muscles intact.  HEENT: Head atraumatic, normocephalic. Oropharynx and nasopharynx clear.  NECK:  Supple, no jugular venous distention. No thyroid enlargement, no tenderness.  LUNGS: Normal breath sounds bilaterally, no wheezing, rales,rhonchi or crepitation. No use of accessory muscles of respiration.  CARDIOVASCULAR: S1, S2 normal. No murmurs, rubs, or gallops.  ABDOMEN: Soft, nontender, nondistended. Bowel sounds present. No organomegaly or mass.  EXTREMITIES: No pedal edema, cyanosis, or clubbing.  Redness over dorsum of right hand and right fore arm. Multiple puncture wounds over right fore arm and right hand. NEUROLOGIC: Cranial nerves II through XII are intact. Muscle strength 5/5 in all extremities. Sensation intact. Gait not checked.  PSYCHIATRIC: The patient is alert and oriented x 3.  SKIN: Redness of skin over right hand and right fore  arm. Wounds noted over right hand with tenderness and swelling.  LABORATORY PANEL:   CBC  Recent Labs Lab 04/08/16 0021  WBC 11.7*  HGB 12.2  HCT 38.1  PLT 258  MCV 83.4  MCH 26.8  MCHC 32.1  RDW 15.3*  LYMPHSABS 1.0  MONOABS 0.7  EOSABS 0.0  BASOSABS 0.1   ------------------------------------------------------------------------------------------------------------------  Chemistries   Recent Labs Lab 04/08/16 0021  NA 135  K 3.9  CL 101  CO2 25  GLUCOSE 221*  BUN 12  CREATININE 1.03*  CALCIUM 8.7*  AST 19  ALT 16  ALKPHOS 74  BILITOT 0.1*   ------------------------------------------------------------------------------------------------------------------ estimated creatinine clearance is 64.3 mL/min (by C-G formula based on SCr of 1.03 mg/dL (H)). ------------------------------------------------------------------------------------------------------------------ No results for input(s): TSH, T4TOTAL, T3FREE, THYROIDAB in the last 72 hours.  Invalid input(s): FREET3   Coagulation profile No results for input(s): INR, PROTIME in the last 168 hours. ------------------------------------------------------------------------------------------------------------------- No results for input(s): DDIMER in the last 72 hours. -------------------------------------------------------------------------------------------------------------------  Cardiac Enzymes No results for input(s): CKMB, TROPONINI, MYOGLOBIN in the last 168 hours.  Invalid input(s): CK ------------------------------------------------------------------------------------------------------------------ Invalid input(s): POCBNP  ---------------------------------------------------------------------------------------------------------------  Urinalysis    Component Value Date/Time   COLORURINE YELLOW (A) 04/08/2016 0021   APPEARANCEUR CLEAR (A) 04/08/2016 0021   APPEARANCEUR Clear 06/11/2014 1845    LABSPEC 1.019 04/08/2016 0021   LABSPEC 1.038 06/11/2014 1845   PHURINE 8.0 04/08/2016 0021   GLUCOSEU 150 (A) 04/08/2016 0021   GLUCOSEU Negative 06/11/2014 1845   HGBUR NEGATIVE 04/08/2016 0021   HGBUR negative 06/01/2009 1556   BILIRUBINUR NEGATIVE 04/08/2016 0021   BILIRUBINUR Negative 06/11/2014 1845   KETONESUR NEGATIVE 04/08/2016 0021   PROTEINUR 100 (A) 04/08/2016 0021   UROBILINOGEN 0.2 12/08/2010 1543   UROBILINOGEN 0.2 06/01/2009 1556   NITRITE NEGATIVE 04/08/2016 0021   LEUKOCYTESUR NEGATIVE 04/08/2016 0021   LEUKOCYTESUR Negative 06/11/2014 1845     RADIOLOGY: Dg Chest 2 View  Result Date: 04/08/2016 CLINICAL DATA:  Status post fall, with fever.  Initial encounter. EXAM: CHEST  2 VIEW COMPARISON:  Chest radiograph performed 07/28/2015, and CTA of the chest performed 07/30/2015 FINDINGS: The lungs are well-aerated. Mild vascular congestion is noted. There is no evidence of focal opacification, pleural effusion or pneumothorax. The heart is borderline normal in size. No acute osseous abnormalities are seen. There is chronic deformity of  the left humeral head. IMPRESSION: Mild vascular congestion noted. Lungs remain grossly clear. No displaced rib fracture seen. Electronically Signed   By: Garald Balding M.D.   On: 04/08/2016 02:18   Dg Lumbar Spine 2-3 Views  Result Date: 04/08/2016 CLINICAL DATA:  Status post fall, with midline lower back pain. Initial encounter. EXAM: LUMBAR SPINE - 2-3 VIEW COMPARISON:  None. FINDINGS: There is no evidence of fracture or subluxation. Vertebral bodies demonstrate normal height and alignment. Intervertebral disc spaces are preserved. Facet disease is noted at the lower lumbar spine. Mild degenerative change is noted at the lower thoracic and lower lumbar spine. The visualized bowel gas pattern is unremarkable in appearance; air and stool are noted within the colon. The sacroiliac joints are within normal limits. Clips are noted within the  right upper quadrant, reflecting prior cholecystectomy. Diffuse calcification is seen along the abdominal aorta. IMPRESSION: 1. No evidence of fracture or subluxation along the lumbar spine. 2. Mild degenerative change at the lower thoracic and lower lumbar spine. 3. Diffuse aortic atherosclerosis noted. Electronically Signed   By: Garald Balding M.D.   On: 04/08/2016 02:20   Ct Head Wo Contrast  Result Date: 04/08/2016 CLINICAL DATA:  Post unwitnessed fall. EXAM: CT HEAD WITHOUT CONTRAST CT CERVICAL SPINE WITHOUT CONTRAST TECHNIQUE: Multidetector CT imaging of the head and cervical spine was performed following the standard protocol without intravenous contrast. Multiplanar CT image reconstructions of the cervical spine were also generated. COMPARISON:  Head CT 03/16/2015. No dedicated cervical spine imaging, CT angiography of the neck 06/11/2014 reviewed. FINDINGS: CT HEAD FINDINGS Brain: No evidence of acute infarction, hemorrhage, hydrocephalus, extra-axial collection or mass lesion/mass effect. Unchanged encephalomalacia in the right occipital lobe. Unchanged chronic small vessel ischemia. Remote appearing lacunar infarct in the left cerebellum. Vascular: Atherosclerosis of skullbase vasculature without hyperdense vessel. Skull: Normal. Negative for fracture or focal lesion. Sinuses/Orbits: No acute finding. Other: None. CT CERVICAL SPINE FINDINGS Alignment: Straightening of normal lordosis minimal anterolisthesis of C2 on C3 appears stable and degenerative. Minimal anterolisthesis of C6 on C7 appears stable and degenerative. No traumatic subluxation. Skull base and vertebrae: Bony fusion of C4 on C5 may be congenital or postsurgical. No acute fracture. Skullbase is intact. Soft tissues and spinal canal: No prevertebral fluid or swelling. No visible canal hematoma. Disc levels: Absent C4-C5 disc space, may be congenital or postsurgical. Disc space narrowing at C3-C4 with large peripherally calcified  posterior disc osteophyte complex versus ossification of the posterior longitudinal ligament. Disc space narrowing at C5-C6 with similar ossified disc osteophyte complex. Additional milder disc space narrowing throughout. There is multilevel facet arthropathy, advanced. Left neural foraminal stenosis is multifactorial at C3-C4. Bony canal narrowing at C5-C6 secondary to peripherally ossified disc osteophyte complex. These findings are all similar to prior CT angiography. Upper chest: No acute abnormality. Other: None. IMPRESSION: 1. No acute intracranial abnormality. Stable remote and chronic ischemia. 2. Advanced multilevel degenerative change throughout the cervical spine with degenerative disc disease and facet arthropathy. No evidence of acute fracture or subluxation. Electronically Signed   By: Jeb Levering M.D.   On: 04/08/2016 01:45   Ct Cervical Spine Wo Contrast  Result Date: 04/08/2016 CLINICAL DATA:  Post unwitnessed fall. EXAM: CT HEAD WITHOUT CONTRAST CT CERVICAL SPINE WITHOUT CONTRAST TECHNIQUE: Multidetector CT imaging of the head and cervical spine was performed following the standard protocol without intravenous contrast. Multiplanar CT image reconstructions of the cervical spine were also generated. COMPARISON:  Head CT 03/16/2015. No dedicated cervical spine  imaging, CT angiography of the neck 06/11/2014 reviewed. FINDINGS: CT HEAD FINDINGS Brain: No evidence of acute infarction, hemorrhage, hydrocephalus, extra-axial collection or mass lesion/mass effect. Unchanged encephalomalacia in the right occipital lobe. Unchanged chronic small vessel ischemia. Remote appearing lacunar infarct in the left cerebellum. Vascular: Atherosclerosis of skullbase vasculature without hyperdense vessel. Skull: Normal. Negative for fracture or focal lesion. Sinuses/Orbits: No acute finding. Other: None. CT CERVICAL SPINE FINDINGS Alignment: Straightening of normal lordosis minimal anterolisthesis of C2 on C3  appears stable and degenerative. Minimal anterolisthesis of C6 on C7 appears stable and degenerative. No traumatic subluxation. Skull base and vertebrae: Bony fusion of C4 on C5 may be congenital or postsurgical. No acute fracture. Skullbase is intact. Soft tissues and spinal canal: No prevertebral fluid or swelling. No visible canal hematoma. Disc levels: Absent C4-C5 disc space, may be congenital or postsurgical. Disc space narrowing at C3-C4 with large peripherally calcified posterior disc osteophyte complex versus ossification of the posterior longitudinal ligament. Disc space narrowing at C5-C6 with similar ossified disc osteophyte complex. Additional milder disc space narrowing throughout. There is multilevel facet arthropathy, advanced. Left neural foraminal stenosis is multifactorial at C3-C4. Bony canal narrowing at C5-C6 secondary to peripherally ossified disc osteophyte complex. These findings are all similar to prior CT angiography. Upper chest: No acute abnormality. Other: None. IMPRESSION: 1. No acute intracranial abnormality. Stable remote and chronic ischemia. 2. Advanced multilevel degenerative change throughout the cervical spine with degenerative disc disease and facet arthropathy. No evidence of acute fracture or subluxation. Electronically Signed   By: Jeb Levering M.D.   On: 04/08/2016 01:45   Dg Shoulder Left  Result Date: 04/08/2016 CLINICAL DATA:  Status post fall, with left arm pain. Initial encounter. EXAM: LEFT SHOULDER - 2+ VIEW COMPARISON:  CT of the left shoulder performed 07/28/2015 FINDINGS: There is chronic deformity of the proximal left humerus. No fracture is seen. The glenoid is grossly unremarkable. The left acromioclavicular joint is grossly unremarkable. Diffuse soft tissue swelling is noted. 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  No fracture seen. Electronically Signed   By: Garald Balding M.D.   On: 04/08/2016 02:22   Dg Hand Complete  Right  Result Date: 04/08/2016 CLINICAL DATA:  Status post fall, with right hand pain, erythema and swelling. Initial encounter. EXAM: RIGHT HAND - COMPLETE 3+ VIEW COMPARISON:  None. FINDINGS: There is no evidence of fracture or dislocation. Minimal degenerative change is noted at the fourth distal interphalangeal joint, with slight sclerosis. The carpal rows are intact, and demonstrate normal alignment. The soft tissues are unremarkable in appearance. IMPRESSION: No evidence of fracture or dislocation. Electronically Signed   By: Garald Balding M.D.   On: 04/08/2016 02:22    EKG: Orders placed or performed during the hospital encounter of 04/07/16  . ED EKG  . ED EKG    IMPRESSION AND PLAN: 65 year old  female patient with history of coronary artery disease, CABG, hypertension, hyperlipidemia, type 2 diabetes mellitus, hypothyroidism presented to the emergency room for fall and in swelling and redness of the right hand secondary to dog bite. Admitting diagnosis 1. Cellulitis of right hand and right forearm 2. Sepsis secondary to cellulitis 3. Type 2 diabetes mellitus 4. Coronary artery disease 5. Hypertension Treatment plan Admit patient to medical floor IV fluid hydration IV vancomycin and IV Zosyn broad-spectrum antibiotics DVT prophylaxis subcutaneous Lovenox 40 MG daily Resume cardiac medications Blood sugar management Follow-up cultures Supportive care. All the records are reviewed and case discussed with ED  provider. Management plans discussed with the patient, family and they are in agreement.  CODE STATUS:FULL    Code Status Orders        Start     Ordered   04/08/16 0354  Full code  Continuous     04/08/16 0355    Code Status History    Date Active Date Inactive Code Status Order ID Comments User Context   07/29/2015  5:47 AM 08/08/2015  5:32 PM Full Code [id number]  Saundra Shelling, MD Inpatient   07/28/2015 10:34 PM 07/29/2015  5:47 AM Full Code [id number]  Hessie Knows, MD ED     Advance Care Plan : Fransisco Hertz will be medical decision maker if patient is unable to make decisions.  TOTAL TIME TAKING CARE OF THIS PATIENT: 52 minutes.    Saundra Shelling M.D on 04/08/2016 at 3:56 AM  Between 7am to 6pm - Pager - [phone]  After 6pm go to [email or web address] - password [email or web address] [facility]  Office  [phone]  CC: Primary care physician; Lenard Simmer, MD

## 2016-04-08 NOTE — ED Provider Notes (Signed)
[facility] Emergency Department Provider Note   ____________________________________________   First MD Initiated Contact with Patient 04/08/16 0003     (approximate)  I have reviewed the triage vital signs and the nursing notes.   HISTORY  Chief Complaint Fall    HPI Anita Small is a 65 y.o. female who comes into the hospital today after a fall. The patient reports that she fell down in the bathroom but she is not sure exactly how. She thinks she may have turned the wrong way when she went down. She doesn't think she passed out and she denies hitting her head. She reports that her left shoulder is sore so she must the fall and there. The patient's left hand is red and she reports that she thinks she may have hit it as well. The patient rates her pain 8 out of 10 in intensity and that is in her hand. The patient denies a headache, blurred vision, denies neck pain. She does have some sores on her hand. The patient denies any chest pain, shortness of breath, cough or runny nose. She reports her last fall was to 3 weeks ago. The patient is here for evaluation by ambulance.   Past Medical History:  Diagnosis Date  . Adenomatous polyp 1999  . Anxiety   . Arthritis    Left knee  . CAD (coronary artery disease) of artery bypass graft    a. NSTEMI in 07/2015: Cath showed 99% stenosis of distal LCx  --> DES placed  . Depression    Paranoid tendencies  . Diabetes mellitus    Type II  . Diverticulosis   . GERD (gastroesophageal reflux disease)   . Hyperlipidemia   . Hypertension   . Hypothyroidism   . Obesity     Patient Active Problem List   Diagnosis Date Noted  . NSTEMI (non-ST elevated myocardial infarction) ([place])   . Fall 07/29/2015  . Fracture of left humerus 07/28/2015  . Femur fracture, left ([place]) 07/28/2015  . Elevated troponin 07/28/2015  . Routine general medical examination at a health care facility 09/19/2010  . HLD  (hyperlipidemia) 09/19/2010  . CAROTID ARTERY STENOSIS 07/25/2009  . Type 2 diabetes mellitus ([place]) 11/01/2006  . ANXIETY 11/01/2006  . Hypothyroidism 10/31/2006  . OBESITY 10/31/2006  . Episodic mood disorder ([place]) 10/31/2006  . Essential hypertension 10/31/2006  . GERD 10/31/2006  . Osteoarthritis, multiple sites 10/31/2006    Past Surgical History:  Procedure Laterality Date  . ABDOMINAL HYSTERECTOMY  1977   One ovary left  . APPENDECTOMY    . BLADDER REPAIR  1977   Tacking  . CARDIAC CATHETERIZATION N/A 08/01/2015   Procedure: Left Heart Cath and Coronary Angiography;  Surgeon: Wellington Hampshire, MD;  Location: [facility];  Service: Cardiovascular;  Laterality: N/A;  . CARDIAC CATHETERIZATION N/A 08/01/2015   Procedure: Coronary Stent Intervention;  Surgeon: Wellington Hampshire, MD;  Location: [facility];  Service: Cardiovascular;  Laterality: N/A;  . CARDIOVASCULAR STRESS TEST  7/00   negative. No cardiolite  . CAROTID ENDARTERECTOMY  2/11   Dr Jamal Collin  . [place]  . CHOLECYSTECTOMY    . CT ABD W & PELVIS WO CM  2002   Negative. Pituitary normal on MRI  . HEEL SPUR SURGERY  03/03   Left  . INTRAMEDULLARY (IM) NAIL INTERTROCHANTERIC Left 08/04/2015   Procedure: INTRAMEDULLARY (IM) NAIL INTERTROCHANTRIC;  Surgeon: Hessie Knows, MD;  Location: [facility];  Service: Orthopedics;  Laterality: Left;  . KNEE ARTHROSCOPY  2001   Left   Tamala Julian)  . TOTAL KNEE ARTHROPLASTY  11/12   left--Dr Tamala Julian  . TRANSTHORACIC ECHOCARDIOGRAM  3/04   normal    Prior to Admission medications   Medication Sig Start Date End Date Taking? Authorizing Provider  bisacodyl (DULCOLAX) 5 MG EC tablet Take 1 tablet (5 mg total) by mouth daily as needed for moderate constipation. 08/08/15  Yes Vaughan Basta, MD  cetirizine (ZYRTEC) 10 MG tablet Take 10 mg by mouth at bedtime.   Yes Historical Provider, MD  citalopram (CELEXA) 40 MG tablet Take 40 mg by mouth daily.    Yes Historical Provider, MD  clopidogrel (PLAVIX) 75 MG tablet Take 1 tablet (75 mg total) by mouth daily with breakfast. 08/08/15  Yes Vaughan Basta, MD  fenofibrate (TRICOR) 145 MG tablet Take 145 mg by mouth daily.   Yes Historical Provider, MD  hydrochlorothiazide (HYDRODIURIL) 25 MG tablet Take 25 mg by mouth daily.   Yes Historical Provider, MD  Insulin Glargine (TOUJEO SOLOSTAR) 300 UNIT/ML SOPN Inject 35 Units into the skin daily.   Yes Historical Provider, MD  levothyroxine (SYNTHROID, LEVOTHROID) 100 MCG tablet Take 1 tablet (100 mcg total) by mouth daily. 02/25/14  Yes Venia Carbon, MD  lisinopril (PRINIVIL,ZESTRIL) 20 MG tablet Take 1 tablet (20 mg total) by mouth daily. Take everyday with HCTZ Patient taking differently: Take 20 mg by mouth daily.  02/25/14  Yes Venia Carbon, MD  metoprolol tartrate (LOPRESSOR) 25 MG tablet TAKE 1 TABLET TWICE DAILY 12/05/15  Yes Wellington Hampshire, MD  sitaGLIPtin-metformin (JANUMET) 50-1000 MG per tablet Take 1 tablet by mouth 2 (two) times daily with a meal. 02/25/14  Yes Venia Carbon, MD  traZODone (DESYREL) 100 MG tablet Take 1 tablet (100 mg total) by mouth at bedtime. Patient taking differently: Take 100 mg by mouth at bedtime as needed for sleep.  08/08/15  Yes Vaughan Basta, MD    Allergies Codeine phosphate; Duloxetine; Meperidine hcl; Norco [hydrocodone-acetaminophen]; Venlafaxine; and Doxycycline  Family History  Problem Relation Age of Onset  . Cancer Mother     Breast  . Heart disease Mother   . Diabetes Mother   . Colon polyps Mother   . Heart disease Father   . Diabetes Father   . Hypertension Sister   . Diabetes Sister   . Depression Daughter   . Cancer Paternal Grandmother     ? throat CA  . Colon cancer      Unsure if mother had it     Social History Social History  Substance Use Topics  . Smoking status: Former Smoker    Types: Cigarettes  . Smokeless tobacco: Never Used  . Alcohol use Yes      Comment: rarely    Review of Systems Constitutional:  fever/chills Eyes: No visual changes. ENT: No sore throat. Cardiovascular: Denies chest pain. Respiratory: Denies shortness of breath. Gastrointestinal: No abdominal pain.  No nausea, no vomiting.  No diarrhea.  No constipation. Genitourinary: Negative for dysuria. Musculoskeletal: Negative for back pain. Skin:Erythema to right hand Neurological: Fall and mild confusion  10-point ROS otherwise negative.  ____________________________________________   PHYSICAL EXAM:  VITAL SIGNS: ED Triage Vitals  Enc Vitals Group     BP 04/08/16 0011 137/69     Pulse Rate 04/08/16 0011 85     Resp 04/08/16 0011 18     Temp 04/08/16 0011 (!) 100.6 F (38.1 C)  Temp Source 04/08/16 0011 Oral     SpO2 04/08/16 0011 96 %     Weight 04/08/16 0014 258 lb (117 kg)     Height 04/08/16 0014 5\' 2"  (1.575 m)     Head Circumference --      Peak Flow --      Pain Score 04/08/16 0014 7     Pain Loc --      Pain Edu? --      Excl. in [place]? --     Constitutional: Alert and oriented. Well appearing and in Mild distress. Eyes: Conjunctivae are normal. PERRL. EOMI. Head: Atraumatic. Nose: No congestion/rhinnorhea. Mouth/Throat: Mucous membranes are moist.  Oropharynx non-erythematous. Neck: No cervical spine tenderness to palpation. Cardiovascular: Normal rate, regular rhythm. Grossly normal heart sounds.  Good peripheral circulation. Respiratory: Normal respiratory effort.  No retractions. Lungs CTAB. Gastrointestinal: Soft and nontender. No distention. Positive bowel sounds Musculoskeletal: No lower extremity tenderness nor edema.   Neurologic:  Normal speech and language. Cranial nerves II through XII grossly intact with no focal motor neuro deficits Skin:  Skin is warm, dry and intact. Erythema to right hand with some warmth and tenderness to palpation Psychiatric: Mood and affect are normal.    ____________________________________________   LABS (all labs ordered are listed, but only abnormal results are displayed)  Labs Reviewed  COMPREHENSIVE METABOLIC PANEL - Abnormal; Notable for the following:       Result Value   Glucose, Bld 221 (*)    Creatinine, Ser 1.03 (*)    Calcium 8.7 (*)    Total Bilirubin 0.1 (*)    GFR calc non Af Amer 55 (*)    All other components within normal limits  LACTIC ACID, PLASMA - Abnormal; Notable for the following:    Lactic Acid, Venous 2.2 (*)    All other components within normal limits  CBC WITH DIFFERENTIAL/PLATELET - Abnormal; Notable for the following:    WBC 11.7 (*)    RDW 15.3 (*)    Neutro Abs 10.1 (*)    All other components within normal limits  URINALYSIS COMPLETEWITH MICROSCOPIC ([facility] ONLY) - Abnormal; Notable for the following:    Color, Urine YELLOW (*)    APPearance CLEAR (*)    Glucose, UA 150 (*)    Protein, ur 100 (*)    Bacteria, UA RARE (*)    Squamous Epithelial / LPF 6-30 (*)    All other components within normal limits  GLUCOSE, CAPILLARY - Abnormal; Notable for the following:    Glucose-Capillary 220 (*)    All other components within normal limits  CULTURE, BLOOD (ROUTINE X 2)  CULTURE, BLOOD (ROUTINE X 2)  URINE CULTURE  CULTURE, BLOOD (ROUTINE X 2)  CULTURE, BLOOD (ROUTINE X 2)  LACTIC ACID, PLASMA  CBG MONITORING, ED   ____________________________________________  EKG  ED ECG REPORT I, Loney Hering, the attending physician, personally viewed and interpreted this ECG.   Date: 04/08/2016  EKG Time: 0004  Rate: 93  Rhythm: normal sinus rhythm  Axis: normal  Intervals:none  ST&T Change: Flipped T waves in leads 3 and aVF with some ST depression in leads V6, V5, V4.  ____________________________________________  RADIOLOGY  Chest x-ray CT head and cervical spine Lumbar spine x-ray Left shoulder x-ray Right hand  x-ray ____________________________________________   PROCEDURES  Procedure(s) performed: None  Procedures  Critical Care performed: No  ____________________________________________   INITIAL IMPRESSION / ASSESSMENT AND PLAN / ED COURSE  Pertinent labs & imaging  results that were available during my care of the patient were reviewed by me and considered in my medical decision making (see chart for details).  This is a 65 year old female who comes into the hospital today with a fall. The patient was found to be febrile on her admission to the emergency department. The patient does have some mild confusion and does not remember exactly what happened. I'm concerned about possible sepsis in this patient. I did send some blood work which showed a white count of 11.7 but the patient does have a lactic acid of 2.2. The patient receive a liter of normal saline and she will be reassessed. I am awaiting the results of her imaging at this time.  Clinical Course  Value Comment By Time  CT Cervical Spine Wo Contrast 1. No acute intracranial abnormality. Stable remote and chronic ischemia. 2. Advanced multilevel degenerative change throughout the cervical spine with degenerative disc disease and facet arthropathy. No evidence of acute fracture or subluxation.   Loney Hering, MD 10/15 0225  DG Chest 2 View Mild vascular congestion noted. Lungs remain grossly clear. No displaced rib fracture seen   Loney Hering, MD 10/15 0225  DG Lumbar Spine 2-3 Views . No evidence of fracture or subluxation along the lumbar spine. 2. Mild degenerative change at the lower thoracic and lower lumbar spine. 3. Diffuse aortic atherosclerosis noted.   Loney Hering, MD 10/15 0226  DG Shoulder Left Chronic deformity of the proximal left humerus.  No fracture seen. Loney Hering, MD 10/15 0226  DG Hand Complete Right No evidence of fracture or dislocation Loney Hering, MD 10/15 0226   The  patient's imaging studies do not show any acute injuries. As the patient does have an elevated lactic acid I'm concerned about sepsis. I will give the patient some Zosyn and vancomycin and admit her to the hospitalist service. The patient has no further complaints and her vital signs are unremarkable.  ED Sepsis - Repeat Assessment   Performed at:    04/08/16 3am  Last Vitals:    Blood pressure (!) 142/55, pulse 91, temperature (!) 101.3, temperature source Oral, resp. rate 13, height 5\' 2"  (1.575 m), weight 258 lb (117 kg), SpO2 95 %.  Heart:                  RRR no murmurs rubs or gallop  Lungs:     CTAB  Capillary Refill:   <2sec  Peripheral Pulse (include location): Radial pulse 2+   Skin (include color):   Warm, normal color  The patient will be admitted to the hospitalist service. She will also receive a dose of ibuprofen. ____________________________________________   FINAL CLINICAL IMPRESSION(S) / ED DIAGNOSES  Final diagnoses:  Fall, initial encounter  Sepsis, due to unspecified organism ([place])  Cellulitis of right upper extremity      NEW MEDICATIONS STARTED DURING THIS VISIT:  New Prescriptions   No medications on file     Note:  This document was prepared using Dragon voice recognition software and may include unintentional dictation errors.    Loney Hering, MD 04/08/16 [phone]

## 2016-04-08 NOTE — ED Triage Notes (Signed)
Pt states she got up to use bathroom. Pt states she does not know the circumstances of her fall. Pt is febrile and poorly oriented at this time. Pt states pain in R hand, swelling and redness are evident. Pt c/o L shoulder pain, worse than it was prior to fall. Pt states she had pain in L shoulder prior to fall. Pt c/o low back pain, does not recall if she fell onto her back. Pt states she was only on floor for a few minutes before her son helped her to her feet.

## 2016-04-08 NOTE — Progress Notes (Signed)
CRITICAL VALUE ALERT  Critical value received:  MRSA POSITIVE  Date of notification:  04/08/2016  Time of notification:  [id number]  Critical value read back:Yes.    Nurse who received alert: Towanda Octave  MD notified (1st page): Posey Pronto  Time of first page:  59  MD notified (2nd page):  Time of second page:  Responding MD:    Time MD responded:

## 2016-04-08 NOTE — Progress Notes (Signed)
Paged MD to inform of lactic acid 2.4

## 2016-04-08 NOTE — ED Notes (Signed)
Patient transported to CT 

## 2016-04-08 NOTE — Progress Notes (Signed)
PHARMACY - PHYSICIAN COMMUNICATION CRITICAL VALUE ALERT - BLOOD CULTURE IDENTIFICATION (BCID)  Results for orders placed or performed during the hospital encounter of 04/07/16  Blood Culture ID Panel (Reflexed) (Collected: 04/08/2016 12:21 AM)  Result Value Ref Range   Enterococcus species NOT DETECTED NOT DETECTED   Listeria monocytogenes NOT DETECTED NOT DETECTED   Staphylococcus species DETECTED (A) NOT DETECTED   Staphylococcus aureus DETECTED (A) NOT DETECTED   Methicillin resistance DETECTED (A) NOT DETECTED   Streptococcus species DETECTED (A) NOT DETECTED   Streptococcus agalactiae NOT DETECTED NOT DETECTED   Streptococcus pneumoniae NOT DETECTED NOT DETECTED   Streptococcus pyogenes DETECTED (A) NOT DETECTED   Acinetobacter baumannii NOT DETECTED NOT DETECTED   Enterobacteriaceae species NOT DETECTED NOT DETECTED   Enterobacter cloacae complex NOT DETECTED NOT DETECTED   Escherichia coli NOT DETECTED NOT DETECTED   Klebsiella oxytoca NOT DETECTED NOT DETECTED   Klebsiella pneumoniae NOT DETECTED NOT DETECTED   Proteus species NOT DETECTED NOT DETECTED   Serratia marcescens NOT DETECTED NOT DETECTED   Haemophilus influenzae NOT DETECTED NOT DETECTED   Neisseria meningitidis NOT DETECTED NOT DETECTED   Pseudomonas aeruginosa NOT DETECTED NOT DETECTED   Candida albicans NOT DETECTED NOT DETECTED   Candida glabrata NOT DETECTED NOT DETECTED   Candida krusei NOT DETECTED NOT DETECTED   Candida parapsilosis NOT DETECTED NOT DETECTED   Candida tropicalis NOT DETECTED NOT DETECTED    Name of physician (or Provider) Contacted: Dr. Fritzi Mandes  Changes to prescribed antibiotics required: continue vancomycin, d/c zosyn, start clindamycin since pt is septic.  Ramond Dial 04/08/2016  4:05 PM

## 2016-04-08 NOTE — ED Notes (Signed)
Patient transported to X-ray. After CT.

## 2016-04-09 ENCOUNTER — Inpatient Hospital Stay (HOSPITAL_COMMUNITY)
Admit: 2016-04-09 | Discharge: 2016-04-09 | Disposition: A | Discharge status: Home / Self Care | Payer: Medicare HMO | Attending: Internal Medicine | Admitting: Internal Medicine

## 2016-04-09 DIAGNOSIS — R7881 Bacteremia: Secondary | ICD-10-CM

## 2016-04-09 LAB — GLUCOSE, CAPILLARY
GLUCOSE-CAPILLARY: 205 mg/dL — AB (ref 65–99)
GLUCOSE-CAPILLARY: 174 mg/dL — AB (ref 65–99)

## 2016-04-09 LAB — BLOOD CULTURE ID PANEL (REFLEXED)
Acinetobacter baumannii: NOT DETECTED
CANDIDA ALBICANS: NOT DETECTED
CANDIDA GLABRATA: NOT DETECTED
CANDIDA KRUSEI: NOT DETECTED
CANDIDA PARAPSILOSIS: NOT DETECTED
CANDIDA TROPICALIS: NOT DETECTED
Carbapenem resistance: NOT DETECTED
ENTEROBACTER CLOACAE COMPLEX: NOT DETECTED
ESCHERICHIA COLI: NOT DETECTED
Enterobacteriaceae species: NOT DETECTED
Enterococcus species: NOT DETECTED
HAEMOPHILUS INFLUENZAE: NOT DETECTED
KLEBSIELLA PNEUMONIAE: NOT DETECTED
Klebsiella oxytoca: NOT DETECTED
LISTERIA MONOCYTOGENES: NOT DETECTED
METHICILLIN RESISTANCE: NOT DETECTED
Neisseria meningitidis: NOT DETECTED
PROTEUS SPECIES: NOT DETECTED
Pseudomonas aeruginosa: NOT DETECTED
SERRATIA MARCESCENS: NOT DETECTED
STREPTOCOCCUS PNEUMONIAE: NOT DETECTED
STREPTOCOCCUS PYOGENES: NOT DETECTED
Staphylococcus aureus (BCID): NOT DETECTED
Staphylococcus species: NOT DETECTED
Streptococcus agalactiae: NOT DETECTED
Streptococcus species: NOT DETECTED
Vancomycin resistance: NOT DETECTED

## 2016-04-09 LAB — URINE CULTURE

## 2016-04-09 LAB — C-REACTIVE PROTEIN: CRP: 24 mg/dL — ABNORMAL HIGH (ref <1.0)

## 2016-04-09 LAB — LACTIC ACID, PLASMA: Lactic Acid, Venous: 1.1 mmol/L (ref 0.5–1.9)

## 2016-04-09 LAB — SEDIMENTATION RATE: SED RATE: 64 mm/h — AB (ref 0–30)

## 2016-04-09 LAB — CREATININE, SERUM
Creatinine, Ser: 1.19 mg/dL — ABNORMAL HIGH (ref 0.44–1.00)
GFR, EST AFRICAN AMERICAN: 54 mL/min — AB (ref >60)
GFR, EST NON AFRICAN AMERICAN: 46 mL/min — AB (ref >60)

## 2016-04-09 LAB — VANCOMYCIN, TROUGH: VANCOMYCIN TR: 15 ug/mL (ref 15–20)

## 2016-04-09 MED ORDER — POTASSIUM CHLORIDE CRYS ER 20 MEQ PO TBCR
20.0000 meq | EXTENDED_RELEASE_TABLET | Freq: Once | ORAL | Status: AC
  Administered 2016-04-09: 20 meq via ORAL
  Filled 2016-04-09: qty 1

## 2016-04-09 MED ORDER — INSULIN ASPART 100 UNIT/ML ~~LOC~~ SOLN
0.0000 [IU] | Freq: Three times a day (TID) | SUBCUTANEOUS | Status: DC
  Administered 2016-04-09: 5 [IU] via SUBCUTANEOUS
  Administered 2016-04-10: 3 [IU] via SUBCUTANEOUS
  Administered 2016-04-10: 5 [IU] via SUBCUTANEOUS
  Administered 2016-04-10: 2 [IU] via SUBCUTANEOUS
  Administered 2016-04-11: 3 [IU] via SUBCUTANEOUS
  Filled 2016-04-09: qty 3
  Filled 2016-04-09: qty 2
  Filled 2016-04-09: qty 3
  Filled 2016-04-09: qty 5
  Filled 2016-04-09: qty 2

## 2016-04-09 NOTE — Progress Notes (Signed)
Pharmacy Antibiotic Note  Anita Small is a 65 y.o. female admitted on 04/07/2016 with sepsis and cellulitis.  Pharmacy has been consulted for Zosyn and vancomycin dosing.  Plan: 1. Zosyn 3.375 gm IV x 1 in ED followed in 6 hours by Zosyn 4.5 gm IV Q8H EI for BMI > 40 2. Vancomycin 1 gm IV x 1 followed in 6 hours by vancomycin 1gm IV Q12H, predicted trough 19 mcg/mL. Pharmacy will continue to follow and adjust as needed to maintain trough 15 to 20 mcg/mL.  10/16:  VT @ 19:45 = 15 mcg/mL  Will continue this pt on Vancomycin 1 gm IV Q12H .    Vd 53.6 L, Ke 0.058 hr-1, T1/2 12 hr  Height: 5\' 2"  (157.5 cm) Weight: 263 lb 4.8 oz (119.4 kg) IBW/kg (Calculated) : 50.1  Temp (24hrs), Avg:99.2 F (37.3 C), Min:97.7 F (36.5 C), Max:101.4 F (38.6 C)   Recent Labs Lab 04/08/16 0021 04/08/16 0531 04/09/16 0722 04/09/16 1945  WBC 11.7* 10.9  --   --   CREATININE 1.03* 1.03*  --  1.19*  LATICACIDVEN 2.2* 2.4* 1.1  --   VANCOTROUGH  --   --   --  15    Estimated Creatinine Clearance: 56.3 mL/min (by C-G formula based on SCr of 1.19 mg/dL (H)).    Allergies  Allergen Reactions  . Codeine Phosphate Other (See Comments)    REACTION: unspecified  . Duloxetine Other (See Comments)    REACTION: unspecified  . Meperidine Hcl Other (See Comments)    REACTION: unspecified  . Norco [Hydrocodone-Acetaminophen] Other (See Comments)    hallucinations  . Venlafaxine Other (See Comments)    REACTION: unspecified  . Doxycycline Rash     Thank you for allowing pharmacy to be a part of this patient's care.  Calel Pisarski D, Pharm.D,  Clinical Pharmacist 04/09/2016 9:20 PM

## 2016-04-09 NOTE — Care Management Note (Signed)
Case Management Note  Patient Details  Name: Anita Small MRN: [id number] Date of Birth: 03-Jun-1951  Subjective/Objective:                   Met with patient after talking with infectious disease physician regarding home IV antibiotics. Hopefully we will know more based on cultures pending by Wednesday. He anticipates that patient will at least need 2 weeks of IV antibiotics (possibly 6 depending on study results). Patient is anxious about administering IV antibiotics as she lives alone. She states she is independent with mobility. Her PCP is Dr. Ronnald Collum. She has used [facility] in the past.  Action/Plan:  Referral made to [facility] to evaluation price for antibiotic. I explained to patient that I would be back to discuss home health agency choice. RNCM to continue to follow.   Expected Discharge Date:  04/10/16               Expected Discharge Plan:     In-House Referral:     Discharge planning Services     Post Acute Care Choice:  Durable Medical Equipment, Home Health Choice offered to:  Patient  DME Arranged:  IV pump/equipment DME Agency:  [place]:  RN [facility] Agency:     Status of Service:  In process, will continue to follow  If discussed at Long Length of Stay Meetings, dates discussed:    Additional Comments:  Marshell Garfinkel, RN 04/09/2016, 2:37 PM

## 2016-04-09 NOTE — Consult Note (Signed)
[facility]     Reason for Consult:Bacteremia- MRSA and Grp A Strep, cellulitis   Referring Physician: Nicholes Mango Date of Admission:  04/07/2016   Principal Problem:   Cellulitis   HPI: Anita Small is a 65 y.o. female admitted after a fall and found to have bacteremia with MRSA and GAS. She had cat bite about 2 weeks ago on R hand and arm and has had increased pain and swelling. Also has mult skin lesions and picks at her skin a lot. 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  She had wbc 11 and has had daily tempts to 102.    Past Medical History:  Diagnosis Date  . Adenomatous polyp 1999  . Anxiety   . Arthritis    Left knee  . CAD (coronary artery disease) of artery bypass graft    a. NSTEMI in 07/2015: Cath showed 99% stenosis of distal LCx  --> DES placed  . Depression    Paranoid tendencies  . Diabetes mellitus    Type II  . Diverticulosis   . GERD (gastroesophageal reflux disease)   . Hyperlipidemia   . Hypertension   . Hypothyroidism   . Obesity    Past Surgical History:  Procedure Laterality Date  . ABDOMINAL HYSTERECTOMY  1977   One ovary left  . APPENDECTOMY    . BLADDER REPAIR  1977   Tacking  . CARDIAC CATHETERIZATION N/A 08/01/2015   Procedure: Left Heart Cath and Coronary Angiography;  Surgeon: Wellington Hampshire, MD;  Location: [place] [facility];  Service: Cardiovascular;  Laterality: N/A;  . CARDIAC CATHETERIZATION N/A 08/01/2015   Procedure: Coronary Stent Intervention;  Surgeon: Wellington Hampshire, MD;  Location: [place] [facility];  Service: Cardiovascular;  Laterality: N/A;  . CARDIOVASCULAR STRESS TEST  7/00   negative. No cardiolite  . CAROTID ENDARTERECTOMY  2/11   Dr Jamal Collin  . [employer]  . CHOLECYSTECTOMY    . CT ABD W & PELVIS WO CM  2002   Negative. Pituitary normal on MRI  . HEEL SPUR SURGERY  03/03   Left  . INTRAMEDULLARY (IM) NAIL INTERTROCHANTERIC Left 08/04/2015    Procedure: INTRAMEDULLARY (IM) NAIL INTERTROCHANTRIC;  Surgeon: Hessie Knows, MD;  Location: [facility];  Service: Orthopedics;  Laterality: Left;  . KNEE ARTHROSCOPY  2001   Left   Tamala Julian)  . TOTAL KNEE ARTHROPLASTY  11/12   left--Dr Tamala Julian  . TRANSTHORACIC ECHOCARDIOGRAM  3/04   normal   Social History  Substance Use Topics  . Smoking status: Former Smoker    Types: Cigarettes  . Smokeless tobacco: Never Used  . Alcohol use Yes     Comment: rarely   Family History  Problem Relation Age of Onset  . Cancer Mother     Breast  . Heart disease Mother   . Diabetes Mother   . Colon polyps Mother   . Heart disease Father   . Diabetes Father   . Hypertension Sister   . Diabetes Sister   . Depression Daughter   . Cancer Paternal Grandmother     ? throat CA  . Colon cancer      Unsure if mother had it     Allergies:  Allergies  Allergen Reactions  . Codeine Phosphate Other (See Comments)    REACTION: unspecified  . Duloxetine Other (See Comments)    REACTION: unspecified  . Meperidine Hcl Other (See Comments)  REACTION: unspecified  . Norco [Hydrocodone-Acetaminophen] Other (See Comments)    hallucinations  . Venlafaxine Other (See Comments)    REACTION: unspecified  . Doxycycline Rash    Current antibiotics: Antibiotics Given (last 72 hours)    Date/Time Action Medication Dose Rate   04/08/16 0916 Given   vancomycin (VANCOCIN) IVPB 1000 mg/200 mL premix 1,000 mg 200 mL/hr   04/08/16 0916 Given   piperacillin-tazobactam (ZOSYN) IVPB 4.5 g 4.5 g 200 mL/hr   04/08/16 1521 Given   piperacillin-tazobactam (ZOSYN) IVPB 4.5 g 4.5 g 200 mL/hr   04/08/16 1720 Given   clindamycin (CLEOCIN) IVPB 600 mg 600 mg 100 mL/hr   04/08/16 1947 Given   vancomycin (VANCOCIN) IVPB 1000 mg/200 mL premix 1,000 mg 200 mL/hr   04/09/16 0200 Given   clindamycin (CLEOCIN) IVPB 600 mg 600 mg 100 mL/hr   04/09/16 0721 Given   vancomycin (VANCOCIN) IVPB 1000 mg/200 mL premix 1,000 mg 200  mL/hr   04/09/16 9024 Given   clindamycin (CLEOCIN) IVPB 600 mg 600 mg 100 mL/hr      MEDICATIONS: . citalopram  40 mg Oral Daily  . clindamycin (CLEOCIN) IV  600 mg Intravenous Q8H  . clopidogrel  75 mg Oral Q breakfast  . enoxaparin (LOVENOX) injection  40 mg Subcutaneous BID  . fenofibrate  54 mg Oral Daily  . insulin glargine  35 Units Subcutaneous Daily  . levothyroxine  100 mcg Oral QAC breakfast  . linagliptin  5 mg Oral Daily  . lisinopril  20 mg Oral Daily  . loratadine  10 mg Oral Daily  . metFORMIN  1,000 mg Oral BID WC  . metoprolol tartrate  25 mg Oral BID  . traZODone  100 mg Oral QHS  . vancomycin  1,000 mg Intravenous Q12H    Review of Systems - 11 systems reviewed and negative per HPI   OBJECTIVE: Temp:  [98.4 F (36.9 C)-101.8 F (38.8 C)] 101.4 F (38.6 C) (10/16 0755) Pulse Rate:  [82-93] 88 (10/16 0755) Resp:  [18-20] 18 (10/16 0755) BP: (113-168)/(69-98) 161/70 (10/16 0755) SpO2:  [94 %-100 %] 97 % (10/16 0755) Physical Exam  Constitutional:  oriented to person, place, and time. Obese, dishelved HENT: [place]/AT, PERRLA, no scleral icterus Mouth/Throat: Oropharynx is clear and moist. No oropharyngeal exudate.  Cardiovascular: Normal rate, regular rhythm 2/6 SM at LUSB Pulmonary/Chest: Effort normal and breath sounds normal. No respiratory distress.  has no wheezes.  Neck =supple, no nuchal rigidity Abdominal: Soft. Bowel sounds are normal.  exhibits no distension. There is no tenderness.  Lymphadenopathy: no cervical adenopathy. No axillary adenopathy Neurological: alert and oriented to person, place, and time.  Skin:  R hand dorsum bright red, with some edema and pain extending up forearm. some mild erythema above elbow. Able to flex and extend hand.  Has a 2 cm circular scab on abd.  Psychiatric: a normal mood and affect.  behavior is normal.    LABS: Results for orders placed or performed during the hospital encounter of 04/07/16 (from the past  48 hour(s))  Comprehensive metabolic panel     Status: Abnormal   Collection Time: 04/08/16 12:21 AM  Result Value Ref Range   Sodium 135 135 - 145 mmol/L   Potassium 3.9 3.5 - 5.1 mmol/L   Chloride 101 101 - 111 mmol/L   CO2 25 22 - 32 mmol/L   Glucose, Bld 221 (H) 65 - 99 mg/dL   BUN 12 6 - 20 mg/dL   Creatinine, Ser  1.03 (H) 0.44 - 1.00 mg/dL   Calcium 8.7 (L) 8.9 - 10.3 mg/dL   Total Protein 6.9 6.5 - 8.1 g/dL   Albumin 3.5 3.5 - 5.0 g/dL   AST 19 15 - 41 U/L   ALT 16 14 - 54 U/L   Alkaline Phosphatase 74 38 - 126 U/L   Total Bilirubin 0.1 (L) 0.3 - 1.2 mg/dL   GFR calc non Af Amer 55 (L) >60 mL/min   GFR calc Af Amer >60 >60 mL/min    Comment: (NOTE) The eGFR has been calculated using the CKD EPI equation. This calculation has not been validated in all clinical situations. eGFR's persistently <60 mL/min signify possible Chronic Kidney Disease.    Anion gap 9 5 - 15  Lactic acid, plasma     Status: Abnormal   Collection Time: 04/08/16 12:21 AM  Result Value Ref Range   Lactic Acid, Venous 2.2 (HH) 0.5 - 1.9 mmol/L    Comment: CRITICAL RESULT CALLED TO, READ BACK BY AND VERIFIED WITH RACHEL HAYDEN AT 0258 04/08/16.PMH  CBC with Differential     Status: Abnormal   Collection Time: 04/08/16 12:21 AM  Result Value Ref Range   WBC 11.7 (H) 3.6 - 11.0 K/uL   RBC 4.57 3.80 - 5.20 MIL/uL   Hemoglobin 12.2 12.0 - 16.0 g/dL   HCT 38.1 35.0 - 47.0 %   MCV 83.4 80.0 - 100.0 fL   MCH 26.8 26.0 - 34.0 pg   MCHC 32.1 32.0 - 36.0 g/dL   RDW 15.3 (H) 11.5 - 14.5 %   Platelets 258 150 - 440 K/uL   Neutrophils Relative % 85 %   Neutro Abs 10.1 (H) 1.4 - 6.5 K/uL   Lymphocytes Relative 8 %   Lymphs Abs 1.0 1.0 - 3.6 K/uL   Monocytes Relative 6 %   Monocytes Absolute 0.7 0.2 - 0.9 K/uL   Eosinophils Relative 0 %   Eosinophils Absolute 0.0 0 - 0.7 K/uL   Basophils Relative 1 %   Basophils Absolute 0.1 0 - 0.1 K/uL  Culture, blood (Routine x 2)     Status: None (Preliminary result)    Collection Time: 04/08/16 12:21 AM  Result Value Ref Range   Specimen Description BLOOD    Special Requests NONE    Culture NO GROWTH 1 DAY    Report Status PENDING   Culture, blood (Routine x 2)     Status: None (Preliminary result)   Collection Time: 04/08/16 12:21 AM  Result Value Ref Range   Specimen Description BLOOD    Special Requests NONE    Culture  Setup Time      GRAM POSITIVE COCCI IN BOTH AEROBIC AND ANAEROBIC BOTTLES CRITICAL RESULT CALLED TO, READ BACK BY AND VERIFIED WITH: MELISSA [place] ON 04/08/16 AT 1551 BY [facility] Performed at [facility]    Report Status PENDING   Urinalysis complete, with microscopic     Status: Abnormal   Collection Time: 04/08/16 12:21 AM  Result Value Ref Range   Color, Urine YELLOW (A) YELLOW   APPearance CLEAR (A) CLEAR   Glucose, UA 150 (A) NEGATIVE mg/dL   Bilirubin Urine NEGATIVE NEGATIVE   Ketones, ur NEGATIVE NEGATIVE mg/dL   Specific Gravity, Urine 1.019 1.005 - 1.030   Hgb urine dipstick NEGATIVE NEGATIVE   pH 8.0 5.0 - 8.0   Protein, ur 100 (A) NEGATIVE mg/dL   Nitrite NEGATIVE NEGATIVE   Leukocytes, UA  NEGATIVE NEGATIVE   RBC / HPF 0-5 0 - 5 RBC/hpf   WBC, UA 0-5 0 - 5 WBC/hpf   Bacteria, UA RARE (A) NONE SEEN   Squamous Epithelial / LPF 6-30 (A) NONE SEEN   Mucous PRESENT   Blood Culture ID Panel (Reflexed)     Status: Abnormal   Collection Time: 04/08/16 12:21 AM  Result Value Ref Range   Enterococcus species NOT DETECTED NOT DETECTED   Listeria monocytogenes NOT DETECTED NOT DETECTED   Staphylococcus species DETECTED (A) NOT DETECTED    Comment: CRITICAL RESULT CALLED TO, READ BACK BY AND VERIFIED WITH: MELISSA MACCIA ON 04/08/16 AT 1551 BY [facility]    Staphylococcus aureus DETECTED (A) NOT DETECTED    Comment: [place] ON 04/08/16 AT 1551 BY [facility]   Methicillin resistance DETECTED (A) NOT DETECTED    Comment: MELISSA MACCIA ON 04/08/16 AT 5573 BY [facility]   Streptococcus  species DETECTED (A) NOT DETECTED    Comment: MELISSA MACCIA ON 04/08/16 AT 1551 BY [facility]   Streptococcus agalactiae NOT DETECTED NOT DETECTED   Streptococcus pneumoniae NOT DETECTED NOT DETECTED   Streptococcus pyogenes DETECTED (A) NOT DETECTED    Comment: MELISSA MACCIA ON 04/08/16 AT 1551 BY [facility]   Acinetobacter baumannii NOT DETECTED NOT DETECTED   Enterobacteriaceae species NOT DETECTED NOT DETECTED   Enterobacter cloacae complex NOT DETECTED NOT DETECTED   Escherichia coli NOT DETECTED NOT DETECTED   Klebsiella oxytoca NOT DETECTED NOT DETECTED   Klebsiella pneumoniae NOT DETECTED NOT DETECTED   Proteus species NOT DETECTED NOT DETECTED   Serratia marcescens NOT DETECTED NOT DETECTED   Haemophilus influenzae NOT DETECTED NOT DETECTED   Neisseria meningitidis NOT DETECTED NOT DETECTED   Pseudomonas aeruginosa NOT DETECTED NOT DETECTED   Candida albicans NOT DETECTED NOT DETECTED   Candida glabrata NOT DETECTED NOT DETECTED   Candida krusei NOT DETECTED NOT DETECTED   Candida parapsilosis NOT DETECTED NOT DETECTED   Candida tropicalis NOT DETECTED NOT DETECTED  Glucose, capillary     Status: Abnormal   Collection Time: 04/08/16 12:22 AM  Result Value Ref Range   Glucose-Capillary 220 (H) 65 - 99 mg/dL  Blood Culture (routine x 2)     Status: None (Preliminary result)   Collection Time: 04/08/16  5:31 AM  Result Value Ref Range   Specimen Description BLOOD LEFT ASSIST CONTROL    Special Requests BOTTLES DRAWN AEROBIC AND ANAEROBIC 5CC    Culture NO GROWTH 1 DAY    Report Status PENDING   Basic metabolic panel     Status: Abnormal   Collection Time: 04/08/16  5:31 AM  Result Value Ref Range   Sodium 136 135 - 145 mmol/L   Potassium 3.4 (L) 3.5 - 5.1 mmol/L   Chloride 104 101 - 111 mmol/L   CO2 23 22 - 32 mmol/L   Glucose, Bld 223 (H) 65 - 99 mg/dL   BUN 13 6 - 20 mg/dL   Creatinine, Ser 1.03 (H) 0.44 - 1.00 mg/dL   Calcium 8.1 (L) 8.9 - 10.3 mg/dL   GFR calc non Af Amer 55  (L) >60 mL/min   GFR calc Af Amer >60 >60 mL/min    Comment: (NOTE) The eGFR has been calculated using the CKD EPI equation. This calculation has not been validated in all clinical situations. eGFR's persistently <60 mL/min signify possible Chronic Kidney Disease.    Anion gap 9 5 - 15  CBC     Status: Abnormal   Collection  Time: 04/08/16  5:31 AM  Result Value Ref Range   WBC 10.9 3.6 - 11.0 K/uL   RBC 4.08 3.80 - 5.20 MIL/uL   Hemoglobin 11.1 (L) 12.0 - 16.0 g/dL   HCT 34.3 (L) 35.0 - 47.0 %   MCV 84.0 80.0 - 100.0 fL   MCH 27.2 26.0 - 34.0 pg   MCHC 32.4 32.0 - 36.0 g/dL   RDW 15.4 (H) 11.5 - 14.5 %   Platelets 226 150 - 440 K/uL  Lactic acid, plasma     Status: Abnormal   Collection Time: 04/08/16  5:31 AM  Result Value Ref Range   Lactic Acid, Venous 2.4 (HH) 0.5 - 1.9 mmol/L    Comment: CRITICAL RESULT CALLED TO, READ BACK BY AND VERIFIED WITH LAURIE MEEKS @ [phone] 04/08/16 BY [facility]   Blood Culture (routine x 2)     Status: None (Preliminary result)   Collection Time: 04/08/16  5:48 AM  Result Value Ref Range   Specimen Description BLOOD LEFT HAND    Special Requests BOTTLES DRAWN AEROBIC AND ANAEROBIC 5CC    Culture NO GROWTH 1 DAY    Report Status PENDING   MRSA PCR Screening     Status: Abnormal   Collection Time: 04/08/16 11:24 AM  Result Value Ref Range   MRSA by PCR POSITIVE (A) NEGATIVE    Comment:        The GeneXpert MRSA Assay (FDA approved for NASAL specimens only), is one component of a comprehensive MRSA colonization surveillance program. It is not intended to diagnose MRSA infection nor to guide or monitor treatment for MRSA infections. RESULT CALLED TO, READ BACK BY AND VERIFIED WITH: AARON CARTER AT 2595 ON 04/08/16.[place] KitchenMarland KitchenKrak[place]   Lactic acid, plasma     Status: None   Collection Time: 04/09/16  7:22 AM  Result Value Ref Range   Lactic Acid, Venous 1.1 0.5 - 1.9 mmol/L   No components found for: ESR, C REACTIVE PROTEIN MICRO: Recent Results (from  the past 720 hour(s))  Culture, blood (Routine x 2)     Status: None (Preliminary result)   Collection Time: 04/08/16 12:21 AM  Result Value Ref Range Status   Specimen Description BLOOD  Final   Special Requests NONE  Final   Culture NO GROWTH 1 DAY  Final   Report Status PENDING  Incomplete  Culture, blood (Routine x 2)     Status: None (Preliminary result)   Collection Time: 04/08/16 12:21 AM  Result Value Ref Range Status   Specimen Description BLOOD  Final   Special Requests NONE  Final   Culture  Setup Time   Final    GRAM POSITIVE COCCI IN BOTH AEROBIC AND ANAEROBIC BOTTLES CRITICAL RESULT CALLED TO, READ BACK BY AND VERIFIED WITH: MELISSA [place] ON 04/08/16 AT 1551 BY [facility] Performed at [facility]  Final   Report Status PENDING  Incomplete  Blood Culture ID Panel (Reflexed)     Status: Abnormal   Collection Time: 04/08/16 12:21 AM  Result Value Ref Range Status   Enterococcus species NOT DETECTED NOT DETECTED Final   Listeria monocytogenes NOT DETECTED NOT DETECTED Final   Staphylococcus species DETECTED (A) NOT DETECTED Final    Comment: CRITICAL RESULT CALLED TO, READ BACK BY AND VERIFIED WITH: MELISSA MACCIA ON 04/08/16 AT 1551 BY [facility]    Staphylococcus aureus DETECTED (A) NOT DETECTED Final    Comment: MELISSA [place] ON 04/08/16 AT 1551 BY  [facility]   Methicillin resistance DETECTED (A) NOT DETECTED Final    Comment: MELISSA [place] ON 04/08/16 AT 1551 BY [facility]   Streptococcus species DETECTED (A) NOT DETECTED Final    Comment: MELISSA MACCIA ON 04/08/16 AT 1551 BY [facility]   Streptococcus agalactiae NOT DETECTED NOT DETECTED Final   Streptococcus pneumoniae NOT DETECTED NOT DETECTED Final   Streptococcus pyogenes DETECTED (A) NOT DETECTED Final    Comment: MELISSA [place] ON 04/08/16 AT 1551 BY [facility]   Acinetobacter baumannii NOT DETECTED NOT DETECTED Final   Enterobacteriaceae species NOT DETECTED NOT DETECTED Final   Enterobacter cloacae  complex NOT DETECTED NOT DETECTED Final   Escherichia coli NOT DETECTED NOT DETECTED Final   Klebsiella oxytoca NOT DETECTED NOT DETECTED Final   Klebsiella pneumoniae NOT DETECTED NOT DETECTED Final   Proteus species NOT DETECTED NOT DETECTED Final   Serratia marcescens NOT DETECTED NOT DETECTED Final   Haemophilus influenzae NOT DETECTED NOT DETECTED Final   Neisseria meningitidis NOT DETECTED NOT DETECTED Final   Pseudomonas aeruginosa NOT DETECTED NOT DETECTED Final   Candida albicans NOT DETECTED NOT DETECTED Final   Candida glabrata NOT DETECTED NOT DETECTED Final   Candida krusei NOT DETECTED NOT DETECTED Final   Candida parapsilosis NOT DETECTED NOT DETECTED Final   Candida tropicalis NOT DETECTED NOT DETECTED Final  Blood Culture (routine x 2)     Status: None (Preliminary result)   Collection Time: 04/08/16  5:31 AM  Result Value Ref Range Status   Specimen Description BLOOD LEFT ASSIST CONTROL  Final   Special Requests BOTTLES DRAWN AEROBIC AND ANAEROBIC 5CC  Final   Culture NO GROWTH 1 DAY  Final   Report Status PENDING  Incomplete  Blood Culture (routine x 2)     Status: None (Preliminary result)   Collection Time: 04/08/16  5:48 AM  Result Value Ref Range Status   Specimen Description BLOOD LEFT HAND  Final   Special Requests BOTTLES DRAWN AEROBIC AND ANAEROBIC 5CC  Final   Culture NO GROWTH 1 DAY  Final   Report Status PENDING  Incomplete  MRSA PCR Screening     Status: Abnormal   Collection Time: 04/08/16 11:24 AM  Result Value Ref Range Status   MRSA by PCR POSITIVE (A) NEGATIVE Final    Comment:        The GeneXpert MRSA Assay (FDA approved for NASAL specimens only), is one component of a comprehensive MRSA colonization surveillance program. It is not intended to diagnose MRSA infection nor to guide or monitor treatment for MRSA infections. RESULT CALLED TO, READ BACK BY AND VERIFIED WITH: AARON CARTER AT 2297 ON 04/08/16.Marland KitchenMarland KitchenDelta Memorial Hospital     IMAGING: Dg Chest 2  View  Result Date: 04/08/2016 CLINICAL DATA:  Status post fall, with fever.  Initial encounter. EXAM: CHEST  2 VIEW COMPARISON:  Chest radiograph performed 07/28/2015, and CTA of the chest performed 07/30/2015 FINDINGS: The lungs are well-aerated. Mild vascular congestion is noted. There is no evidence of focal opacification, pleural effusion or pneumothorax. The heart is borderline normal in size. No acute osseous abnormalities are seen. There is chronic deformity of the left humeral head. IMPRESSION: Mild vascular congestion noted. Lungs remain grossly clear. No displaced rib fracture seen. Electronically Signed   By: Garald Balding M.D.   On: 04/08/2016 02:18   Dg Lumbar Spine 2-3 Views  Result Date: 04/08/2016 CLINICAL DATA:  Status post fall, with midline lower back pain. Initial encounter. EXAM: LUMBAR SPINE - 2-3 VIEW COMPARISON:  None. FINDINGS: There is no evidence of fracture or subluxation. Vertebral bodies demonstrate normal height and alignment. Intervertebral disc spaces are preserved. Facet disease is noted at the lower lumbar spine. Mild degenerative change is noted at the lower thoracic and lower lumbar spine. The visualized bowel gas pattern is unremarkable in appearance; air and stool are noted within the colon. The sacroiliac joints are within normal limits. Clips are noted within the right upper quadrant, reflecting prior cholecystectomy. Diffuse calcification is seen along the abdominal aorta. IMPRESSION: 1. No evidence of fracture or subluxation along the lumbar spine. 2. Mild degenerative change at the lower thoracic and lower lumbar spine. 3. Diffuse aortic atherosclerosis noted. Electronically Signed   By: Garald Balding M.D.   On: 04/08/2016 02:20   Ct Head Wo Contrast  Result Date: 04/08/2016 CLINICAL DATA:  Post unwitnessed fall. EXAM: CT HEAD WITHOUT CONTRAST CT CERVICAL SPINE WITHOUT CONTRAST TECHNIQUE: Multidetector CT imaging of the head and cervical spine was performed  following the standard protocol without intravenous contrast. Multiplanar CT image reconstructions of the cervical spine were also generated. COMPARISON:  Head CT 03/16/2015. No dedicated cervical spine imaging, CT angiography of the neck 06/11/2014 reviewed. FINDINGS: CT HEAD FINDINGS Brain: No evidence of acute infarction, hemorrhage, hydrocephalus, extra-axial collection or mass lesion/mass effect. Unchanged encephalomalacia in the right occipital lobe. Unchanged chronic small vessel ischemia. Remote appearing lacunar infarct in the left cerebellum. Vascular: Atherosclerosis of skullbase vasculature without hyperdense vessel. Skull: Normal. Negative for fracture or focal lesion. Sinuses/Orbits: No acute finding. Other: None. CT CERVICAL SPINE FINDINGS Alignment: Straightening of normal lordosis minimal anterolisthesis of C2 on C3 appears stable and degenerative. Minimal anterolisthesis of C6 on C7 appears stable and degenerative. No traumatic subluxation. Skull base and vertebrae: Bony fusion of C4 on C5 may be congenital or postsurgical. No acute fracture. Skullbase is intact. Soft tissues and spinal canal: No prevertebral fluid or swelling. No visible canal hematoma. Disc levels: Absent C4-C5 disc space, may be congenital or postsurgical. Disc space narrowing at C3-C4 with large peripherally calcified posterior disc osteophyte complex versus ossification of the posterior longitudinal ligament. Disc space narrowing at C5-C6 with similar ossified disc osteophyte complex. Additional milder disc space narrowing throughout. There is multilevel facet arthropathy, advanced. Left neural foraminal stenosis is multifactorial at C3-C4. Bony canal narrowing at C5-C6 secondary to peripherally ossified disc osteophyte complex. These findings are all similar to prior CT angiography. Upper chest: No acute abnormality. Other: None. IMPRESSION: 1. No acute intracranial abnormality. Stable remote and chronic ischemia. 2. Advanced  multilevel degenerative change throughout the cervical spine with degenerative disc disease and facet arthropathy. No evidence of acute fracture or subluxation. Electronically Signed   By: Jeb Levering M.D.   On: 04/08/2016 01:45   Ct Cervical Spine Wo Contrast  Result Date: 04/08/2016 CLINICAL DATA:  Post unwitnessed fall. EXAM: CT HEAD WITHOUT CONTRAST CT CERVICAL SPINE WITHOUT CONTRAST TECHNIQUE: Multidetector CT imaging of the head and cervical spine was performed following the standard protocol without intravenous contrast. Multiplanar CT image reconstructions of the cervical spine were also generated. COMPARISON:  Head CT 03/16/2015. No dedicated cervical spine imaging, CT angiography of the neck 06/11/2014 reviewed. FINDINGS: CT HEAD FINDINGS Brain: No evidence of acute infarction, hemorrhage, hydrocephalus, extra-axial collection or mass lesion/mass effect. Unchanged encephalomalacia in the right occipital lobe. Unchanged chronic small vessel ischemia. Remote appearing lacunar infarct in the left cerebellum. Vascular: Atherosclerosis of skullbase vasculature without hyperdense vessel. Skull: Normal. Negative for fracture or focal lesion. Sinuses/Orbits: No  acute finding. Other: None. CT CERVICAL SPINE FINDINGS Alignment: Straightening of normal lordosis minimal anterolisthesis of C2 on C3 appears stable and degenerative. Minimal anterolisthesis of C6 on C7 appears stable and degenerative. No traumatic subluxation. Skull base and vertebrae: Bony fusion of C4 on C5 may be congenital or postsurgical. No acute fracture. Skullbase is intact. Soft tissues and spinal canal: No prevertebral fluid or swelling. No visible canal hematoma. Disc levels: Absent C4-C5 disc space, may be congenital or postsurgical. Disc space narrowing at C3-C4 with large peripherally calcified posterior disc osteophyte complex versus ossification of the posterior longitudinal ligament. Disc space narrowing at C5-C6 with similar  ossified disc osteophyte complex. Additional milder disc space narrowing throughout. There is multilevel facet arthropathy, advanced. Left neural foraminal stenosis is multifactorial at C3-C4. Bony canal narrowing at C5-C6 secondary to peripherally ossified disc osteophyte complex. These findings are all similar to prior CT angiography. Upper chest: No acute abnormality. Other: None. IMPRESSION: 1. No acute intracranial abnormality. Stable remote and chronic ischemia. 2. Advanced multilevel degenerative change throughout the cervical spine with degenerative disc disease and facet arthropathy. No evidence of acute fracture or subluxation. Electronically Signed   By: Jeb Levering M.D.   On: 04/08/2016 01:45   Dg Shoulder Left  Result Date: 04/08/2016 CLINICAL DATA:  Status post fall, with left arm pain. Initial encounter. EXAM: LEFT SHOULDER - 2+ VIEW COMPARISON:  CT of the left shoulder performed 07/28/2015 FINDINGS: There is chronic deformity of the proximal left humerus. No fracture is seen. The glenoid is grossly unremarkable. The left acromioclavicular joint is grossly unremarkable. Diffuse soft tissue swelling is noted. 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  No fracture seen. Electronically Signed   By: Garald Balding M.D.   On: 04/08/2016 02:22   Dg Hand Complete Right  Result Date: 04/08/2016 CLINICAL DATA:  Status post fall, with right hand pain, erythema and swelling. Initial encounter. EXAM: RIGHT HAND - COMPLETE 3+ VIEW COMPARISON:  None. FINDINGS: There is no evidence of fracture or dislocation. Minimal degenerative change is noted at the fourth distal interphalangeal joint, with slight sclerosis. The carpal rows are intact, and demonstrate normal alignment. The soft tissues are unremarkable in appearance. IMPRESSION: No evidence of fracture or dislocation. Electronically Signed   By: Garald Balding M.D.   On: 04/08/2016 02:22    Assessment:   Anita Small  is a 65 y.o. female with MRSA, and Grp A strep bacteremia following a dog bite on arm 2 weeks ago.  Came to ED due to fall and found to have fever to 101.8 and bcx + as above. Was on vanco and zosyn, now vanco and clindamycin.  She has multiple skin lesions, including one on Abd which was prior MRSA per her report.  She has a hx of THR but no pain at site and no intravascular devices.  Recommendations Check echo -pending. If negative will likely TEE to rule out endocarditis.  Check ESR CRP - ordered Cont vanco and clinda Repeat bcx pending 10/16. I have given her pillows and shown her how to elevate her arm while sitting in chair. Will need 2 week min IV vanco given the MRSA bacteremia- will need longer if we find evidence of endocarditis or osteomyelitis.  Thank you very much for allowing me to participate in the care of this patient. Please call with questions.   Cheral Marker. Ola Spurr, MD

## 2016-04-09 NOTE — Progress Notes (Signed)
Inpatient Diabetes Program Recommendations  AACE/ADA: New Consensus Statement on Inpatient Glycemic Control (2015)  Target Ranges:  Prepandial:   less than 140 mg/dL      Peak postprandial:   less than 180 mg/dL (1-2 hours)      Critically ill patients:  140 - 180 mg/dL  Results for Anita Small, Anita Small (MRN [id number]) as of 04/09/2016 11:09  Ref. Range 04/08/2016 00:21 04/08/2016 05:31  Glucose Latest Ref Range: 65 - 99 mg/dL 221 (H) 223 (H)   Results for Anita Small, Anita Small (MRN [id number]) as of 04/09/2016 11:09  Ref. Range 04/08/2016 00:22  Glucose-Capillary Latest Ref Range: 65 - 99 mg/dL 220 (H)   Review of Glycemic Control  Diabetes history: DM2 Outpatient Diabetes medications: Toujeo 35 units daily, Janumet 50-1000 mg BID Current orders for Inpatient glycemic control: Lantus 35 units daily, Metformin 1000 mg BID, Tradjenta 5 mg daily  Inpatient Diabetes Program Recommendations:  Correction (SSI): Patient is receiving oral DM medications along with basal insulin and no CBGs in chart over the past 24 hours to evaluate glycemic control. While inpatient, please order CBGs with Novolog correction scale ACHS. HgbA1C: Please consider ordering an A1C to evaluate glycemic control over the past 2-3 months.  Thanks, Barnie Alderman, RN, MSN, CDE Diabetes Coordinator Inpatient Diabetes Program [phone] (Team Pager from [place] to [place]) [phone] ([facility] office) [phone] [facility] office) [phone] [facility] office)

## 2016-04-09 NOTE — Plan of Care (Signed)
Problem: Bowel/Gastric: Goal: Will not experience complications related to bowel motility Outcome: Progressing Pt is progressing toward goal of d/c. Awaiting test results/cultures. Pt has remained free of falls/injury this shift.

## 2016-04-09 NOTE — Progress Notes (Signed)
Shift assessment completed at approx 0730. Pt resting in bed with eyes closed, easily awakened, alert and oriented but conversation is tangential. Skin is warm and ry. Pt is on room ari, lungs are clear bilat, monitor in place, sr noted. Abdomen is soft, bs heard. Pt is wearing incontinence brief. Ppp, no edema noted to bilat le's. Pt's R hand is reddened and edematous, especially across the knuckles, but redness and faded within the drawn borders to her hand. Pt has limited movement to the fingers. Pt has an area toe herRAC that is turning pink, skin there is intact, multiple small scabs noted to r hand. PIV #20 intact to lac, ivf ns infusing at 164mls/hr,site is free of redness and swelling. Dr. Posey Pronto has rounded on pt and outline the area of pt's rac. Pt found to be febrile at 101.4, received tylenol po with temp recheck of 98.7. Pt was sweating profusely at that time. Pt denied pain at assessment.Pt has no drainage noted from R arm.

## 2016-04-09 NOTE — Progress Notes (Signed)
[name] NAME: Anita Small    MR#:  [id number]  DATE OF BIRTH:  1950/12/13  SUBJECTIVE:   Came with erythema over the right arm after her dog bit her. Continues with Fever 101.4 today  REVIEW OF SYSTEMS:   Review of Systems  Constitutional: Positive for fever. Negative for chills and weight loss.  HENT: Negative for ear discharge, ear pain and nosebleeds.   Eyes: Negative for blurred vision, pain and discharge.  Respiratory: Negative for sputum production, shortness of breath, wheezing and stridor.   Cardiovascular: Negative for chest pain, palpitations, orthopnea and PND.  Gastrointestinal: Negative for abdominal pain, diarrhea, nausea and vomiting.  Genitourinary: Negative for frequency and urgency.  Musculoskeletal: Negative for back pain and joint pain.  Skin: Positive for rash.       cellulitis right arm with scabs over the arm  Neurological: Positive for weakness. Negative for sensory change, speech change and focal weakness.  Psychiatric/Behavioral: Negative for depression and hallucinations. The patient is not nervous/anxious.    Tolerating Diet:yes Tolerating PT: ambulatory  DRUG ALLERGIES:   Allergies  Allergen Reactions  . Codeine Phosphate Other (See Comments)    REACTION: unspecified  . Duloxetine Other (See Comments)    REACTION: unspecified  . Meperidine Hcl Other (See Comments)    REACTION: unspecified  . Norco [Hydrocodone-Acetaminophen] Other (See Comments)    hallucinations  . Venlafaxine Other (See Comments)    REACTION: unspecified  . Doxycycline Rash    VITALS:  Blood pressure (!) 161/70, pulse 88, temperature (!) 101.4 F (38.6 C), temperature source Oral, resp. rate 18, height 5\' 2"  (1.575 m), weight 119.4 kg (263 lb 4.8 oz), SpO2 97 %.  PHYSICAL EXAMINATION:   Physical Exam  GENERAL:  65 y.o.-year-old patient lying in the bed with no acute distress.  EYES: Pupils equal,  round, reactive to light and accommodation. No scleral icterus. Extraocular muscles intact.  HEENT: Head atraumatic, normocephalic. Oropharynx and nasopharynx clear.  NECK:  Supple, no jugular venous distention. No thyroid enlargement, no tenderness.  LUNGS: Normal breath sounds bilaterally, no wheezing, rales, rhonchi. No use of accessory muscles of respiration.  CARDIOVASCULAR: S1, S2 normal. No murmurs, rubs, or gallops.  ABDOMEN: Soft, nontender, nondistended. Bowel sounds present. No organomegaly or mass.  EXTREMITIES: No cyanosis, clubbing or edema b/l.   Right arm/hand cellulitis with dried scabs over the forearm and swelling of the hand. Good radial pulse, erythema extending upto the left arm  NEUROLOGIC: Cranial nerves II through XII are intact. No focal Motor or sensory deficits b/l.   PSYCHIATRIC:  patient is alert and oriented x 3.  SKIN: No obvious rash, lesion, or ulcer.   LABORATORY PANEL:  CBC  Recent Labs Lab 04/08/16 0531  WBC 10.9  HGB 11.1*  HCT 34.3*  PLT 226    Chemistries   Recent Labs Lab 04/08/16 0021 04/08/16 0531  NA 135 136  K 3.9 3.4*  CL 101 104  CO2 25 23  GLUCOSE 221* 223*  BUN 12 13  CREATININE 1.03* 1.03*  CALCIUM 8.7* 8.1*  AST 19  --   ALT 16  --   ALKPHOS 74  --   BILITOT 0.1*  --    Cardiac Enzymes No results for input(s): TROPONINI in the last 168 hours. RADIOLOGY:  Dg Chest 2 View  Result Date: 04/08/2016 CLINICAL DATA:  Status post fall, with fever.  Initial encounter. EXAM: CHEST  2 VIEW COMPARISON:  Chest radiograph performed 07/28/2015, and CTA of the chest performed 07/30/2015 FINDINGS: The lungs are well-aerated. Mild vascular congestion is noted. There is no evidence of focal opacification, pleural effusion or pneumothorax. The heart is borderline normal in size. No acute osseous abnormalities are seen. There is chronic deformity of the left humeral head. IMPRESSION: Mild vascular congestion noted. Lungs remain grossly  clear. No displaced rib fracture seen. Electronically Signed   By: Garald Balding M.D.   On: 04/08/2016 02:18   Dg Lumbar Spine 2-3 Views  Result Date: 04/08/2016 CLINICAL DATA:  Status post fall, with midline lower back pain. Initial encounter. EXAM: LUMBAR SPINE - 2-3 VIEW COMPARISON:  None. FINDINGS: There is no evidence of fracture or subluxation. Vertebral bodies demonstrate normal height and alignment. Intervertebral disc spaces are preserved. Facet disease is noted at the lower lumbar spine. Mild degenerative change is noted at the lower thoracic and lower lumbar spine. The visualized bowel gas pattern is unremarkable in appearance; air and stool are noted within the colon. The sacroiliac joints are within normal limits. Clips are noted within the right upper quadrant, reflecting prior cholecystectomy. Diffuse calcification is seen along the abdominal aorta. IMPRESSION: 1. No evidence of fracture or subluxation along the lumbar spine. 2. Mild degenerative change at the lower thoracic and lower lumbar spine. 3. Diffuse aortic atherosclerosis noted. Electronically Signed   By: Garald Balding M.D.   On: 04/08/2016 02:20   Ct Head Wo Contrast  Result Date: 04/08/2016 CLINICAL DATA:  Post unwitnessed fall. EXAM: CT HEAD WITHOUT CONTRAST CT CERVICAL SPINE WITHOUT CONTRAST TECHNIQUE: Multidetector CT imaging of the head and cervical spine was performed following the standard protocol without intravenous contrast. Multiplanar CT image reconstructions of the cervical spine were also generated. COMPARISON:  Head CT 03/16/2015. No dedicated cervical spine imaging, CT angiography of the neck 06/11/2014 reviewed. FINDINGS: CT HEAD FINDINGS Brain: No evidence of acute infarction, hemorrhage, hydrocephalus, extra-axial collection or mass lesion/mass effect. Unchanged encephalomalacia in the right occipital lobe. Unchanged chronic small vessel ischemia. Remote appearing lacunar infarct in the left cerebellum.  Vascular: Atherosclerosis of skullbase vasculature without hyperdense vessel. Skull: Normal. Negative for fracture or focal lesion. Sinuses/Orbits: No acute finding. Other: None. CT CERVICAL SPINE FINDINGS Alignment: Straightening of normal lordosis minimal anterolisthesis of C2 on C3 appears stable and degenerative. Minimal anterolisthesis of C6 on C7 appears stable and degenerative. No traumatic subluxation. Skull base and vertebrae: Bony fusion of C4 on C5 may be congenital or postsurgical. No acute fracture. Skullbase is intact. Soft tissues and spinal canal: No prevertebral fluid or swelling. No visible canal hematoma. Disc levels: Absent C4-C5 disc space, may be congenital or postsurgical. Disc space narrowing at C3-C4 with large peripherally calcified posterior disc osteophyte complex versus ossification of the posterior longitudinal ligament. Disc space narrowing at C5-C6 with similar ossified disc osteophyte complex. Additional milder disc space narrowing throughout. There is multilevel facet arthropathy, advanced. Left neural foraminal stenosis is multifactorial at C3-C4. Bony canal narrowing at C5-C6 secondary to peripherally ossified disc osteophyte complex. These findings are all similar to prior CT angiography. Upper chest: No acute abnormality. Other: None. IMPRESSION: 1. No acute intracranial abnormality. Stable remote and chronic ischemia. 2. Advanced multilevel degenerative change throughout the cervical spine with degenerative disc disease and facet arthropathy. No evidence of acute fracture or subluxation. Electronically Signed   By: Jeb Levering M.D.   On: 04/08/2016 01:45   Ct Cervical Spine Wo Contrast  Result Date: 04/08/2016 CLINICAL DATA:  Post unwitnessed  fall. EXAM: CT HEAD WITHOUT CONTRAST CT CERVICAL SPINE WITHOUT CONTRAST TECHNIQUE: Multidetector CT imaging of the head and cervical spine was performed following the standard protocol without intravenous contrast. Multiplanar CT  image reconstructions of the cervical spine were also generated. COMPARISON:  Head CT 03/16/2015. No dedicated cervical spine imaging, CT angiography of the neck 06/11/2014 reviewed. FINDINGS: CT HEAD FINDINGS Brain: No evidence of acute infarction, hemorrhage, hydrocephalus, extra-axial collection or mass lesion/mass effect. Unchanged encephalomalacia in the right occipital lobe. Unchanged chronic small vessel ischemia. Remote appearing lacunar infarct in the left cerebellum. Vascular: Atherosclerosis of skullbase vasculature without hyperdense vessel. Skull: Normal. Negative for fracture or focal lesion. Sinuses/Orbits: No acute finding. Other: None. CT CERVICAL SPINE FINDINGS Alignment: Straightening of normal lordosis minimal anterolisthesis of C2 on C3 appears stable and degenerative. Minimal anterolisthesis of C6 on C7 appears stable and degenerative. No traumatic subluxation. Skull base and vertebrae: Bony fusion of C4 on C5 may be congenital or postsurgical. No acute fracture. Skullbase is intact. Soft tissues and spinal canal: No prevertebral fluid or swelling. No visible canal hematoma. Disc levels: Absent C4-C5 disc space, may be congenital or postsurgical. Disc space narrowing at C3-C4 with large peripherally calcified posterior disc osteophyte complex versus ossification of the posterior longitudinal ligament. Disc space narrowing at C5-C6 with similar ossified disc osteophyte complex. Additional milder disc space narrowing throughout. There is multilevel facet arthropathy, advanced. Left neural foraminal stenosis is multifactorial at C3-C4. Bony canal narrowing at C5-C6 secondary to peripherally ossified disc osteophyte complex. These findings are all similar to prior CT angiography. Upper chest: No acute abnormality. Other: None. IMPRESSION: 1. No acute intracranial abnormality. Stable remote and chronic ischemia. 2. Advanced multilevel degenerative change throughout the cervical spine with  degenerative disc disease and facet arthropathy. No evidence of acute fracture or subluxation. Electronically Signed   By: Jeb Levering M.D.   On: 04/08/2016 01:45   Dg Shoulder Left  Result Date: 04/08/2016 CLINICAL DATA:  Status post fall, with left arm pain. Initial encounter. EXAM: LEFT SHOULDER - 2+ VIEW COMPARISON:  CT of the left shoulder performed 07/28/2015 FINDINGS: There is chronic deformity of the proximal left humerus. No fracture is seen. The glenoid is grossly unremarkable. The left acromioclavicular joint is grossly unremarkable. Diffuse soft tissue swelling is noted. 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  No fracture seen. Electronically Signed   By: Garald Balding M.D.   On: 04/08/2016 02:22   Dg Hand Complete Right  Result Date: 04/08/2016 CLINICAL DATA:  Status post fall, with right hand pain, erythema and swelling. Initial encounter. EXAM: RIGHT HAND - COMPLETE 3+ VIEW COMPARISON:  None. FINDINGS: There is no evidence of fracture or dislocation. Minimal degenerative change is noted at the fourth distal interphalangeal joint, with slight sclerosis. The carpal rows are intact, and demonstrate normal alignment. The soft tissues are unremarkable in appearance. IMPRESSION: No evidence of fracture or dislocation. Electronically Signed   By: Garald Balding M.D.   On: 04/08/2016 02:22   ASSESSMENT AND PLAN:  65 year old  female patient with history of coronary artery disease, CABG, hypertension, hyperlipidemia, type 2 diabetes mellitus, hypothyroidism presented to the emergency room for fall and in swelling and redness of the right hand secondary to dog bite.  1. Cellulitis of right hand and right forearm s/p dog bite---spread of erythema over the arm -IV vanc and clinda - BC 1/4 positve for MRSA and strep pyogenes -fever this am of 101.4 again -positive MRSA PCR ordered. -Echo today -  ID consult  2. Sepsis secondary to cellulitis -fever,low bp and  elevated LA -LA now back to normal 1.1  3. Type 2 diabetes mellitus -cont home dose Insulin and Janumet  4. Coronary artery disease -cont plavix  5. Hypertension -metoprolol,lisinorpil,HCTZ  6.HL On tricor  Case discussed with Care Management/Social Worker. Management plans discussed with the patient, family and they are in agreement.  CODE STATUS: FULL DVT Prophylaxis: lovenox TOTAL TIME TAKING CARE OF THIS PATIENT: 30 minutes.  >50% time spent on counselling and coordination of care with pt  POSSIBLE D/C IN 1-2 DAYS, DEPENDING ON CLINICAL CONDITION.  Note: This dictation was prepared with Dragon dictation along with smaller phrase technology. Any transcriptional errors that result from this process are unintentional.  Shomari Matusik M.D on 04/09/2016 at 8:12 AM  Between 7am to 6pm - Pager - [phone]  After 6pm go to [email or web address] - password [email or web address] [facility]  Office  [phone]  CC: Primary care physician; Lenard Simmer, MD

## 2016-04-09 NOTE — Progress Notes (Signed)
Chaplain rounded this Pt and had conversation with her regarding her care. Pt was very thankful for the service received from Doctors and Nurses at the hospital and was also grateful for the chaplains visit. She told the Ch she had not heard anybody pray for her for along time and request prayers specifically for healing and close relationship with Jesus which the Ch provided.   04/09/16 1400  Clinical Encounter Type  Visited With Patient  Visit Type Initial  Referral From Nurse  Spiritual Encounters  Spiritual Needs Prayer

## 2016-04-10 LAB — GLUCOSE, CAPILLARY
GLUCOSE-CAPILLARY: 147 mg/dL — AB (ref 65–99)
GLUCOSE-CAPILLARY: 142 mg/dL — AB (ref 65–99)
Glucose-Capillary: 164 mg/dL — ABNORMAL HIGH (ref 65–99)
Glucose-Capillary: 168 mg/dL — ABNORMAL HIGH (ref 65–99)

## 2016-04-10 LAB — ECHOCARDIOGRAM COMPLETE
Height: 62 in
Weight: 4212.8 oz

## 2016-04-10 MED ORDER — CHLORHEXIDINE GLUCONATE CLOTH 2 % EX PADS: 6.0000 | MEDICATED_PAD | Freq: Every day | CUTANEOUS | Status: DC

## 2016-04-10 MED ORDER — MUPIROCIN 2 % EX OINT
1.0000 "application " | TOPICAL_OINTMENT | Freq: Two times a day (BID) | CUTANEOUS | Status: DC
  Administered 2016-04-10 – 2016-04-11 (×3): 1 via NASAL
  Filled 2016-04-10: qty 22

## 2016-04-10 NOTE — Progress Notes (Signed)
Infectious Disease Long Term IV Antibiotic Orders  Diagnosis:Anita Small a 65 y.o.femalewith MRSA, and Grp A strep bacteremia following a dog bite on arm 2 weeks ago. Came to ED due to fall and found to have fever to 101.8 and bcx + as above. Was on vanco and zosyn, now vanco and clindamycin. She has multiple skin lesions, including one on Abd which was prior MRSA per her report.  She has a hx of THR but no pain at site and no intravascular devices. 04/09/16 ESR 64, crp 24 TTE NEG, TEE pending Allergies:  Allergies  Allergen Reactions  . Codeine Phosphate Other (See Comments)    REACTION: unspecified  . Duloxetine Other (See Comments)    REACTION: unspecified  . Meperidine Hcl Other (See Comments)    REACTION: unspecified  . Norco [Hydrocodone-Acetaminophen] Other (See Comments)    hallucinations  . Venlafaxine Other (See Comments)    REACTION: unspecified  . Doxycycline Rash    Discharge antibiotics Vancomycin            1000      mg  every    12           hours .     Goal vancomycin trough 15-20.    Pharmacy to adjust dosing based on levels PICC Care per protocol Labs weekly while on IV antibiotics      CBC w diff   Comprehensive met panel Vancomycin Trough   CRP  ESR   Planned duration of antibiotics 2-6 weeks depending on TEE  Stop date 10/30 for 2 weeks    Follow up clinic date within 2 weeks   FAX weekly labs to [phone]  Leonel Ramsay, MD

## 2016-04-10 NOTE — Progress Notes (Signed)
This was a follow-up visit with patient Ch had seen before to find out how the Pt was doing. Pt appeared to be in good spirit and talked a lot about the progress she was making health-wise. Ch offered spiritual support and presence, and promise to visit Pt again soon.   04/10/16 1300  Clinical Encounter Type  Visited With Patient  Visit Type Follow-up;Spiritual support  Spiritual Encounters  Spiritual Needs Prayer

## 2016-04-10 NOTE — Progress Notes (Signed)
[name] INFECTIOUS DISEASE PROGRESS NOTE Date of Admission:  04/07/2016     ID: Anita Small is a 65 y.o. female with  UE cellulitis following dog bite, Grp A strep and MRSA bacteremia Principal Problem:   Cellulitis   Subjective: Low grade temp, Hand improving.   ROS  Eleven systems are reviewed and negative except per hpi  Medications:  Antibiotics Given (last 72 hours)    Date/Time Action Medication Dose Rate   04/08/16 0916 Given   vancomycin (VANCOCIN) IVPB 1000 mg/200 mL premix 1,000 mg 200 mL/hr   04/08/16 0916 Given   piperacillin-tazobactam (ZOSYN) IVPB 4.5 g 4.5 g 200 mL/hr   04/08/16 1521 Given   piperacillin-tazobactam (ZOSYN) IVPB 4.5 g 4.5 g 200 mL/hr   04/08/16 1720 Given   clindamycin (CLEOCIN) IVPB 600 mg 600 mg 100 mL/hr   04/08/16 1947 Given   vancomycin (VANCOCIN) IVPB 1000 mg/200 mL premix 1,000 mg 200 mL/hr   04/09/16 0200 Given   clindamycin (CLEOCIN) IVPB 600 mg 600 mg 100 mL/hr   04/09/16 0721 Given   vancomycin (VANCOCIN) IVPB 1000 mg/200 mL premix 1,000 mg 200 mL/hr   04/09/16 0925 Given   clindamycin (CLEOCIN) IVPB 600 mg 600 mg 100 mL/hr   04/09/16 1718 Given   clindamycin (CLEOCIN) IVPB 600 mg 600 mg 100 mL/hr   04/09/16 1957 Given   vancomycin (VANCOCIN) IVPB 1000 mg/200 mL premix 1,000 mg 200 mL/hr   04/10/16 0200 Given   clindamycin (CLEOCIN) IVPB 600 mg 600 mg 100 mL/hr   04/10/16 0836 Given   vancomycin (VANCOCIN) IVPB 1000 mg/200 mL premix 1,000 mg 200 mL/hr   04/10/16 1043 Given   clindamycin (CLEOCIN) IVPB 600 mg 600 mg 100 mL/hr     . citalopram  40 mg Oral Daily  . clindamycin (CLEOCIN) IV  600 mg Intravenous Q8H  . clopidogrel  75 mg Oral Q breakfast  . enoxaparin (LOVENOX) injection  40 mg Subcutaneous BID  . fenofibrate  54 mg Oral Daily  . insulin aspart  0-15 Units Subcutaneous TID WC  . insulin glargine  35 Units Subcutaneous Daily  . levothyroxine  100 mcg Oral QAC breakfast  . linagliptin  5 mg Oral  Daily  . lisinopril  20 mg Oral Daily  . loratadine  10 mg Oral Daily  . metFORMIN  1,000 mg Oral BID WC  . metoprolol tartrate  25 mg Oral BID  . traZODone  100 mg Oral QHS  . vancomycin  1,000 mg Intravenous Q12H    Objective: Vital signs in last 24 hours: Temp:  [98.3 F (36.8 C)-100.4 F (38 C)] 100.4 F (38 C) (10/17 0800) Pulse Rate:  [73-106] 106 (10/17 0800) Resp:  [16-19] 19 (10/17 0326) BP: (113-160)/(45-72) 160/72 (10/17 0800) SpO2:  [92 %-98 %] 92 % (10/17 0326) Constitutional:  oriented to person, place, and time. Obese, dishelved HENT: Anita Small/AT, PERRLA, no scleral icterus Mouth/Throat: Oropharynx is clear and moist. No oropharyngeal exudate.  Cardiovascular: Normal rate, regular rhythm 2/6 SM at LUSB Pulmonary/Chest: Effort normal and breath sounds normal. No respiratory distress.  has no wheezes.  Neck =supple, no nuchal rigidity Abdominal: Soft. Bowel sounds are normal.  exhibits no distension. There is no tenderness.  Lymphadenopathy: no cervical adenopathy. No axillary adenopathy Neurological: alert and oriented to person, place, and time.  Skin:  R hand dorsum less red, with some edema and decreased pain extending up forearm. some mild erythema above elbow. Able to flex and extend hand.  Has  a 2 cm circular scab on abd.  Psychiatric: a normal mood and affect.  behavior is normal.   Lab Results  Recent Labs  04/08/16 0021 04/08/16 0531 04/09/16 1945  WBC 11.7* 10.9  --   HGB 12.2 11.1*  --   HCT 38.1 34.3*  --   NA 135 136  --   K 3.9 3.4*  --   CL 101 104  --   CO2 25 23  --   BUN 12 13  --   CREATININE 1.03* 1.03* 1.19*    Microbiology: Results for orders placed or performed during the hospital encounter of 04/07/16  Culture, blood (Routine x 2)     Status: None (Preliminary result)   Collection Time: 04/08/16 12:21 AM  Result Value Ref Range Status   Specimen Description BLOOD  Final   Special Requests NONE  Final   Culture  Setup Time    Final    Organism ID to follow [place] CRITICAL RESULT CALLED TO, READ BACK BY AND VERIFIED WITH: HANK ZOMPA ON 04/09/16 AT 1518 BY [facility]    Culture GRAM POSITIVE RODS  Final   Report Status PENDING  Incomplete  Culture, blood (Routine x 2)     Status: Abnormal (Preliminary result)   Collection Time: 04/08/16 12:21 AM  Result Value Ref Range Status   Specimen Description BLOOD  Final   Special Requests NONE  Final   Culture  Setup Time   Final    GRAM POSITIVE COCCI IN BOTH AEROBIC AND ANAEROBIC BOTTLES CRITICAL RESULT CALLED TO, READ BACK BY AND VERIFIED WITH: MELISSA Kula ON 04/08/16 AT 1551 BY [facility]    Culture (A)  Final    GROUP A STREP (S.PYOGENES) ISOLATED STAPHYLOCOCCUS AUREUS SUSCEPTIBILITIES TO FOLLOW Performed at [facility]    Report Status PENDING  Incomplete   Organism ID, Bacteria GROUP A STREP (S.PYOGENES) ISOLATED  Final      Susceptibility   Group a strep (s.pyogenes) isolated - MIC*    ERYTHROMYCIN <=0.12 SENSITIVE Sensitive     TETRACYCLINE 0.5 SENSITIVE Sensitive     VANCOMYCIN 0.5 SENSITIVE Sensitive     CLINDAMYCIN <=0.25 SENSITIVE Sensitive     Inducible Clindamycin NEGATIVE Sensitive     * GROUP A STREP (S.PYOGENES) ISOLATED  Urine culture     Status: Abnormal   Collection Time: 04/08/16 12:21 AM  Result Value Ref Range Status   Specimen Description URINE, RANDOM  Final   Special Requests NONE  Final   Culture MULTIPLE SPECIES PRESENT, SUGGEST RECOLLECTION (A)  Final   Report Status 04/09/2016 FINAL  Final  Blood Culture ID Panel (Reflexed)     Status: Abnormal   Collection Time: 04/08/16 12:21 AM  Result Value Ref Range Status   Enterococcus species NOT DETECTED NOT DETECTED Final   Listeria monocytogenes NOT DETECTED NOT DETECTED Final   Staphylococcus species DETECTED (A) NOT DETECTED Final    Comment: CRITICAL RESULT CALLED TO, READ BACK BY AND VERIFIED WITH: MELISSA MACCIA ON 04/08/16 AT 1551 BY  [facility]    Staphylococcus aureus DETECTED (A) NOT DETECTED Final    Comment: MELISSA MACCIA ON 04/08/16 AT 1551 BY [facility]   Methicillin resistance DETECTED (A) NOT DETECTED Final    Comment: MELISSA MACCIA ON 04/08/16 AT 1551 BY [facility]   Streptococcus species DETECTED (A) NOT DETECTED Final    Comment: MELISSA MACCIA ON 04/08/16 AT 1551 BY [facility]   Streptococcus agalactiae NOT DETECTED NOT DETECTED Final  Streptococcus pneumoniae NOT DETECTED NOT DETECTED Final   Streptococcus pyogenes DETECTED (A) NOT DETECTED Final    Comment: MELISSA MACCIA ON 04/08/16 AT 1551 BY [facility]   Acinetobacter baumannii NOT DETECTED NOT DETECTED Final   Enterobacteriaceae species NOT DETECTED NOT DETECTED Final   Enterobacter cloacae complex NOT DETECTED NOT DETECTED Final   Escherichia coli NOT DETECTED NOT DETECTED Final   Klebsiella oxytoca NOT DETECTED NOT DETECTED Final   Klebsiella pneumoniae NOT DETECTED NOT DETECTED Final   Proteus species NOT DETECTED NOT DETECTED Final   Serratia marcescens NOT DETECTED NOT DETECTED Final   Haemophilus influenzae NOT DETECTED NOT DETECTED Final   Neisseria meningitidis NOT DETECTED NOT DETECTED Final   Pseudomonas aeruginosa NOT DETECTED NOT DETECTED Final   Candida albicans NOT DETECTED NOT DETECTED Final   Candida glabrata NOT DETECTED NOT DETECTED Final   Candida krusei NOT DETECTED NOT DETECTED Final   Candida parapsilosis NOT DETECTED NOT DETECTED Final   Candida tropicalis NOT DETECTED NOT DETECTED Final  Blood Culture ID Panel (Reflexed)     Status: None   Collection Time: 04/08/16 12:21 AM  Result Value Ref Range Status   Enterococcus species NOT DETECTED NOT DETECTED Final   Vancomycin resistance NOT DETECTED NOT DETECTED Final   Listeria monocytogenes NOT DETECTED NOT DETECTED Final   Staphylococcus species NOT DETECTED NOT DETECTED Final   Staphylococcus aureus NOT DETECTED NOT DETECTED Final   Methicillin resistance NOT DETECTED NOT DETECTED Final    Streptococcus species NOT DETECTED NOT DETECTED Final   Streptococcus agalactiae NOT DETECTED NOT DETECTED Final   Streptococcus pneumoniae NOT DETECTED NOT DETECTED Final   Streptococcus pyogenes NOT DETECTED NOT DETECTED Final   Acinetobacter baumannii NOT DETECTED NOT DETECTED Final   Enterobacteriaceae species NOT DETECTED NOT DETECTED Final   Enterobacter cloacae complex NOT DETECTED NOT DETECTED Final   Escherichia coli NOT DETECTED NOT DETECTED Final   Klebsiella oxytoca NOT DETECTED NOT DETECTED Final   Klebsiella pneumoniae NOT DETECTED NOT DETECTED Final   Proteus species NOT DETECTED NOT DETECTED Final   Serratia marcescens NOT DETECTED NOT DETECTED Final   Carbapenem resistance NOT DETECTED NOT DETECTED Final   Haemophilus influenzae NOT DETECTED NOT DETECTED Final   Neisseria meningitidis NOT DETECTED NOT DETECTED Final   Pseudomonas aeruginosa NOT DETECTED NOT DETECTED Final   Candida albicans NOT DETECTED NOT DETECTED Final   Candida glabrata NOT DETECTED NOT DETECTED Final   Candida krusei NOT DETECTED NOT DETECTED Final   Candida parapsilosis NOT DETECTED NOT DETECTED Final   Candida tropicalis NOT DETECTED NOT DETECTED Final  Blood Culture (routine x 2)     Status: None (Preliminary result)   Collection Time: 04/08/16  5:31 AM  Result Value Ref Range Status   Specimen Description BLOOD LEFT ASSIST CONTROL  Final   Special Requests BOTTLES DRAWN AEROBIC AND ANAEROBIC 5CC  Final   Culture NO GROWTH 2 DAYS  Final   Report Status PENDING  Incomplete  Blood Culture (routine x 2)     Status: None (Preliminary result)   Collection Time: 04/08/16  5:48 AM  Result Value Ref Range Status   Specimen Description BLOOD LEFT HAND  Final   Special Requests BOTTLES DRAWN AEROBIC AND ANAEROBIC 5CC  Final   Culture NO GROWTH 2 DAYS  Final   Report Status PENDING  Incomplete  MRSA PCR Screening     Status: Abnormal   Collection Time: 04/08/16 11:24 AM  Result Value Ref Range  Status  MRSA by PCR POSITIVE (A) NEGATIVE Final    Comment:        The GeneXpert MRSA Assay (FDA approved for NASAL specimens only), is one component of a comprehensive MRSA colonization surveillance program. It is not intended to diagnose MRSA infection nor to guide or monitor treatment for MRSA infections. RESULT CALLED TO, READ BACK BY AND VERIFIED WITH: AARON CARTER AT [id number] ON 04/08/16.Marland KitchenMarland KitchenSaint Luke Institute   Culture, blood (single) w Reflex to ID Panel     Status: None (Preliminary result)   Collection Time: 04/09/16  9:00 AM  Result Value Ref Range Status   Specimen Description BLOOD LEFT HAND  Final   Special Requests   Final    BOTTLES DRAWN AEROBIC AND ANAEROBIC AER 5ML ANA 1ML   Culture NO GROWTH < 24 HOURS  Final   Report Status PENDING  Incomplete    Studies/Results: No results found.  Assessment/Plan: Anita Small is a 65 y.o. female with MRSA, and Grp A strep bacteremia following a dog bite on arm 2 weeks ago.  Came to ED due to fall and found to have fever to 101.8 and bcx + as above. Was on vanco and zosyn, now vanco and clindamycin.  She has multiple skin lesions, including one on Abd which was prior MRSA per her report.  She has a hx of THR but no pain at site and no intravascular devices. 04/09/16 ESR 64, crp 24 TTE pending  Repeat bcx pending 10/16. Recommendations Check echo -pending. If negative will likely TEE to rule out endocarditis.  Cont vanco and clinda Place picc if bcx neg 48 hours  Will need 2 week min IV vanco given the MRSA bacteremia- will need longer if we find evidence of endocarditis or osteomyelitis.   Thank you very much for the consult. Will follow with you.  [place], DAVID P   04/10/2016, 12:52 PM

## 2016-04-10 NOTE — Progress Notes (Signed)
Pt seen and examined. Has history of MRSA and grp a strep bacteremia. TTE negative for vegetation. Asked to proceed with tee. Scheduled for am. Note pt has seen Dr. Fletcher Anon in the office with discuss with [facility] group in am regarding whether they would like to carry out tee. If not, I will proceed.

## 2016-04-10 NOTE — Progress Notes (Signed)
[place] [facility] [place] NAME: Anita Small    MR#:  [id number]  DATE OF BIRTH:  Nov 24, 1950  SUBJECTIVE:   Came with erythema over the right arm after her dog bit her.now afebrile REVIEW OF SYSTEMS:   Review of Systems  Constitutional: Positive for fever. Negative for chills and weight loss.  HENT: Negative for ear discharge, ear pain and nosebleeds.   Eyes: Negative for blurred vision, pain and discharge.  Respiratory: Negative for sputum production, shortness of breath, wheezing and stridor.   Cardiovascular: Negative for chest pain, palpitations, orthopnea and PND.  Gastrointestinal: Negative for abdominal pain, diarrhea, nausea and vomiting.  Genitourinary: Negative for frequency and urgency.  Musculoskeletal: Negative for back pain and joint pain.  Skin: Positive for rash.       cellulitis right arm with scabs over the arm  Neurological: Positive for weakness. Negative for sensory change, speech change and focal weakness.  Psychiatric/Behavioral: Negative for depression and hallucinations. The patient is not nervous/anxious.    Tolerating Diet:yes Tolerating PT: ambulatory  DRUG ALLERGIES:   Allergies  Allergen Reactions  . Codeine Phosphate Other (See Comments)    REACTION: unspecified  . Duloxetine Other (See Comments)    REACTION: unspecified  . Meperidine Hcl Other (See Comments)    REACTION: unspecified  . Norco [Hydrocodone-Acetaminophen] Other (See Comments)    hallucinations  . Venlafaxine Other (See Comments)    REACTION: unspecified  . Doxycycline Rash    VITALS:  Blood pressure (!) 160/72, pulse (!) 106, temperature (!) 100.4 F (38 C), temperature source Oral, resp. rate 19, height 5\' 2"  (1.575 m), weight 119.4 kg (263 lb 4.8 oz), SpO2 92 %.  PHYSICAL EXAMINATION:   Physical Exam  GENERAL:  65 y.o.-year-old patient lying in the bed with no acute distress.  EYES: Pupils equal, round, reactive to  light and accommodation. No scleral icterus. Extraocular muscles intact.  HEENT: Head atraumatic, normocephalic. Oropharynx and nasopharynx clear.  NECK:  Supple, no jugular venous distention. No thyroid enlargement, no tenderness.  LUNGS: Normal breath sounds bilaterally, no wheezing, rales, rhonchi. No use of accessory muscles of respiration.  CARDIOVASCULAR: S1, S2 normal. No murmurs, rubs, or gallops.  ABDOMEN: Soft, nontender, nondistended. Bowel sounds present. No organomegaly or mass.  EXTREMITIES: No cyanosis, clubbing or edema b/l.   Right arm/hand cellulitis with dried scabs over the forearm and swelling of the hand. Good radial pulse, erythema extending upto the left arm  NEUROLOGIC: Cranial nerves II through XII are intact. No focal Motor or sensory deficits b/l.   PSYCHIATRIC:  patient is alert and oriented x 3.  SKIN: No obvious rash, lesion, or ulcer.   LABORATORY PANEL:  CBC  Recent Labs Lab 04/08/16 0531  WBC 10.9  HGB 11.1*  HCT 34.3*  PLT 226    Chemistries   Recent Labs Lab 04/08/16 0021 04/08/16 0531 04/09/16 1945  NA 135 136  --   K 3.9 3.4*  --   CL 101 104  --   CO2 25 23  --   GLUCOSE 221* 223*  --   BUN 12 13  --   CREATININE 1.03* 1.03* 1.19*  CALCIUM 8.7* 8.1*  --   AST 19  --   --   ALT 16  --   --   ALKPHOS 74  --   --   BILITOT 0.1*  --   --    Cardiac Enzymes No results for input(s): TROPONINI in  the last 168 hours. RADIOLOGY:  No results found. ASSESSMENT AND PLAN:  65 year old  female patient with history of coronary artery disease, CABG, hypertension, hyperlipidemia, type 2 diabetes mellitus, hypothyroidism presented to the emergency room for fall and in swelling and redness of the right hand secondary to dog bite.  1.MRSA sepsis with Cellulitis of right hand and right forearm s/p dog bite---spread of erythema over the arm -IV vanc and clinda - BC 1/4 positve for MRSA and strep pyogenes -fever this am of 101.4 again -positive  MRSA PCR ordered. -Echo today results pending. Pt will need TEE to r/o endocarditis. Spoke with Dr Ubaldo Glassing -ID consult appreciated. Pt will need PICC line for IV abxs (duration to be determined pending TEE)  2. Sepsis secondary to cellulitis -fever,low bp and elevated LA -LA now back to normal 1.1  3. Type 2 diabetes mellitus -cont home dose Insulin and Janumet  4. Coronary artery disease -cont plavix  5. Hypertension -metoprolol,lisinorpil,HCTZ  6.HL On tricor   CM for d/c planning with IV abxs  Case discussed with Care Management/Social Worker. Management plans discussed with the patient, family and they are in agreement.  CODE STATUS: FULL DVT Prophylaxis: lovenox TOTAL TIME TAKING CARE OF THIS PATIENT: 30 minutes.  >50% time spent on counselling and coordination of care with pt  POSSIBLE D/C IN 1-2 DAYS, DEPENDING ON CLINICAL CONDITION.  Note: This dictation was prepared with Dragon dictation along with smaller phrase technology. Any transcriptional errors that result from this process are unintentional.  Andrez Lieurance M.D on 04/10/2016 at 12:21 PM  Between 7am to 6pm - Pager - [phone]  After 6pm go to [email or web address] - password [email or web address] [facility]  Office  [phone]  CC: Primary care physician; Lenard Simmer, MD

## 2016-04-10 NOTE — Evaluation (Signed)
Physical Therapy Evaluation Patient Details Name: Anita Small MRN: [id number] DOB: [birth year] Today's Date: 04/10/2016   History of Present Illness  Anita Small  is a 65 y.o. female with a known history of Arthritis, coronary artery disease, CABG, type 2 diabetes mellitus, hypertension, hyperlipidemia, GERD, hypothyroidism presented to the emergency room with redness swelling of the right hand and forearm.  Clinical Impression  Pt presents to PT below baseline functional mobility as demonstrated by needing Min A with bed mobility and decreased endurance with ambulation and decreased balance.  Pt ambulated 12' with RW and CGA for steering as pt kept veering to R side, bumping into counter/wall.  Pt endorses dizziness during ambulation which did not resolve with standing rest break and was returned to room via recliner.  Pt then incontinent of stool and needing assist to toilet for transfers and total assist for peri-care due to not being able to thoroughly clean.  Pt ended in recliner with c/o feeling hot and sweaty.  Dry gown and cold wash cloth provided.  Expect pt to progress for safe return home at discharge.    Follow Up Recommendations Home health PT    Equipment Recommendations  None recommended by PT    Recommendations for Other Services       Precautions / Restrictions Precautions Precautions: Fall Restrictions Weight Bearing Restrictions: No      Mobility  Bed Mobility Overal bed mobility: Needs Assistance Bed Mobility: Supine to Sit     Supine to sit: Min assist     General bed mobility comments: Min A to elevate trunk and rotate hips into sitting  Transfers Overall transfer level: Modified independent Equipment used: Rolling walker (2 wheeled)             General transfer comment: sit<>stand with extra time, effort to rise  Ambulation/Gait Ambulation/Gait assistance: Min guard Ambulation Distance (Feet): 60 Feet Assistive device: Rolling  walker (2 wheeled) Gait Pattern/deviations: Step-through pattern     General Gait Details: difficulty steering RW, veering to R side, suspect due to R UE weakness; c/o dizziness after 60', turned to return to room but pt stopped to hold wall rail; recliner provided for safe return to room.  Stairs            Wheelchair Mobility    Modified Rankin (Stroke Patients Only)       Balance Overall balance assessment: Needs assistance Sitting-balance support: Feet supported Sitting balance-Leahy Scale: Good     Standing balance support: Bilateral upper extremity supported;During functional activity Standing balance-Leahy Scale: Fair Standing balance comment: decreased balance with ambulation, pt feeling like she is falling; able to self correct or pause activity                             Pertinent Vitals/Pain Pain Assessment: No/denies pain    Home Living Family/patient expects to be discharged to:: Private residence Living Arrangements: Alone Available Help at Discharge: Family;Friend(s) Type of Home: Mobile home Home Access: Stairs to enter Entrance Stairs-Rails: Can reach both Entrance Stairs-Number of Steps: 8 Home Layout: One level Home Equipment: Crutches;Adaptive equipment;Walker - 4 wheels      Prior Function Level of Independence: Independent with assistive device(s)         Comments: Pt reports ambulating household distances with rollator and not much else; reports getting along ok at home, doing what she needs to do; reports history of falls     Hand Dominance  Extremity/Trunk Assessment   Upper Extremity Assessment: RUE deficits/detail RUE Deficits / Details: redness in sweelling in hand, decreased grip strength due to pain         Lower Extremity Assessment: Generalized weakness;Overall WFL for tasks assessed         Communication   Communication: No difficulties  Cognition Arousal/Alertness: Awake/alert Behavior  During Therapy: WFL for tasks assessed/performed Overall Cognitive Status: Within Functional Limits for tasks assessed                      General Comments General comments (skin integrity, edema, etc.): errythemia to R dorsal surface of hand    Exercises     Assessment/Plan    PT Assessment Patient needs continued PT services  PT Problem List Decreased activity tolerance;Decreased balance;Decreased mobility;Decreased safety awareness;Pain          PT Treatment Interventions Gait training;Stair training;Functional mobility training;Therapeutic activities;Therapeutic exercise;Balance training;Patient/family education    PT Goals (Current goals can be found in the Care Plan section)  Acute Rehab PT Goals Patient Stated Goal: To go home. PT Goal Formulation: With patient Time For Goal Achievement: 04/17/16 Potential to Achieve Goals: Good    Frequency Min 2X/week   Barriers to discharge Decreased caregiver support lives alone, steps to enter    Co-evaluation               End of Session Equipment Utilized During Treatment: Gait belt Activity Tolerance: Patient limited by fatigue;Other (comment) (limited by dizziness) Patient left: in chair;with call bell/phone within reach Nurse Communication: Mobility status         Time: 1355-1435 PT Time Calculation (min) (ACUTE ONLY): 40 min   Charges:   PT Evaluation $PT Eval Moderate Complexity: 1 Procedure PT Treatments $Therapeutic Activity: 8-22 mins   PT G Codes:        Anita Small, PT 04/10/2016, 2:36 PM

## 2016-04-11 ENCOUNTER — Inpatient Hospital Stay: Payer: Medicare HMO

## 2016-04-11 ENCOUNTER — Encounter
Admission: EM | Disposition: A | Discharge status: Home Health | Payer: Self-pay | Source: Home / Self Care | Attending: Internal Medicine

## 2016-04-11 ENCOUNTER — Inpatient Hospital Stay
Admit: 2016-04-11 | Discharge: 2016-04-11 | Disposition: A | Discharge status: Home / Self Care | Payer: Medicare HMO | Attending: Cardiology | Admitting: Cardiology

## 2016-04-11 ENCOUNTER — Encounter: Payer: Self-pay | Admitting: *Deleted

## 2016-04-11 HISTORY — PX: TEE WITHOUT CARDIOVERSION: SHX5443

## 2016-04-11 LAB — CULTURE, BLOOD (ROUTINE X 2)

## 2016-04-11 LAB — BASIC METABOLIC PANEL
Anion gap: 7 (ref 5–15)
BUN: 17 mg/dL (ref 6–20)
CALCIUM: 8.4 mg/dL — AB (ref 8.9–10.3)
CHLORIDE: 107 mmol/L (ref 101–111)
CO2: 25 mmol/L (ref 22–32)
CREATININE: 1.26 mg/dL — AB (ref 0.44–1.00)
GFR calc non Af Amer: 43 mL/min — ABNORMAL LOW (ref >60)
GFR, EST AFRICAN AMERICAN: 50 mL/min — AB (ref >60)
Glucose, Bld: 118 mg/dL — ABNORMAL HIGH (ref 65–99)
Potassium: 3.2 mmol/L — ABNORMAL LOW (ref 3.5–5.1)
SODIUM: 139 mmol/L (ref 135–145)

## 2016-04-11 LAB — CBC
HCT: 31.1 % — ABNORMAL LOW (ref 35.0–47.0)
Hemoglobin: 10.3 g/dL — ABNORMAL LOW (ref 12.0–16.0)
MCH: 27.1 pg (ref 26.0–34.0)
MCHC: 33 g/dL (ref 32.0–36.0)
MCV: 82.3 fL (ref 80.0–100.0)
PLATELETS: 225 10*3/uL (ref 150–440)
RBC: 3.78 MIL/uL — AB (ref 3.80–5.20)
RDW: 15.3 % — ABNORMAL HIGH (ref 11.5–14.5)
WBC: 7.8 10*3/uL (ref 3.6–11.0)

## 2016-04-11 LAB — VANCOMYCIN, RANDOM: VANCOMYCIN RM: 24

## 2016-04-11 LAB — GLUCOSE, CAPILLARY: GLUCOSE-CAPILLARY: 159 mg/dL — AB (ref 65–99)

## 2016-04-11 SURGERY — ECHOCARDIOGRAM, TRANSESOPHAGEAL
Anesthesia: Moderate Sedation

## 2016-04-11 MED ORDER — MIDAZOLAM HCL 5 MG/5ML IJ SOLN
INTRAMUSCULAR | Status: DC
Start: 2016-04-11 — End: 2016-04-11
  Filled 2016-04-11: qty 5

## 2016-04-11 MED ORDER — FENTANYL CITRATE (PF) 100 MCG/2ML IJ SOLN
INTRAMUSCULAR | Status: AC
  Filled 2016-04-11: qty 4

## 2016-04-11 MED ORDER — BUTAMBEN-TETRACAINE-BENZOCAINE 2-2-14 % EX AERO
INHALATION_SPRAY | CUTANEOUS | Status: AC
  Filled 2016-04-11: qty 20

## 2016-04-11 MED ORDER — MIDAZOLAM HCL 5 MG/5ML IJ SOLN
INTRAMUSCULAR | Status: AC | PRN
  Administered 2016-04-11: 2 mg via INTRAVENOUS
  Administered 2016-04-11: 1 mg via INTRAVENOUS

## 2016-04-11 MED ORDER — SODIUM CHLORIDE 0.9 % IV SOLN: INTRAVENOUS | Status: DC

## 2016-04-11 MED ORDER — SODIUM CHLORIDE FLUSH 0.9 % IV SOLN
INTRAVENOUS | Status: AC
  Filled 2016-04-11: qty 10

## 2016-04-11 MED ORDER — FENTANYL CITRATE (PF) 100 MCG/2ML IJ SOLN
INTRAMUSCULAR | Status: AC | PRN
  Administered 2016-04-11: 50 ug via INTRAVENOUS
  Administered 2016-04-11: 25 ug via INTRAVENOUS

## 2016-04-11 MED ORDER — LIDOCAINE VISCOUS 2 % MT SOLN
OROMUCOSAL | Status: AC
  Filled 2016-04-11: qty 15

## 2016-04-11 MED ORDER — POTASSIUM CHLORIDE CRYS ER 20 MEQ PO TBCR
20.0000 meq | EXTENDED_RELEASE_TABLET | Freq: Two times a day (BID) | ORAL | Status: DC
  Administered 2016-04-11: 20 meq via ORAL
  Filled 2016-04-11: qty 1

## 2016-04-11 MED ORDER — MIDAZOLAM HCL 5 MG/5ML IJ SOLN
INTRAMUSCULAR | Status: AC
  Filled 2016-04-11: qty 5

## 2016-04-11 NOTE — Discharge Summary (Signed)
[facility] NAME: Anita Small    MR#:  [id number]  DATE OF BIRTH:  1950/08/30  DATE OF ADMISSION:  04/07/2016 ADMITTING PHYSICIAN: Saundra Shelling, MD  DATE OF DISCHARGE: 04/10/16  PRIMARY CARE PHYSICIAN: Lenard Simmer, MD    ADMISSION DIAGNOSIS:  Cellulitis of right upper extremity [id number] Fall, initial encounter [W19.XXXA] Sepsis, due to unspecified organism ([place]) [A41.9]  DISCHARGE DIAGNOSIS:   MRSA  And Group A strep sepsis  Right UE cellulitis CKD-II DM-2 HTN SECONDARY DIAGNOSIS:   Past Medical History:  Diagnosis Date  . Adenomatous polyp 1999  . Anxiety   . Arthritis    Left knee  . CAD (coronary artery disease) of artery bypass graft    a. NSTEMI in 07/2015: Cath showed 99% stenosis of distal LCx  --> DES placed  . Depression    Paranoid tendencies  . Diabetes mellitus    Type II  . Diverticulosis   . GERD (gastroesophageal reflux disease)   . Hyperlipidemia   . Hypertension   . Hypothyroidism   . Obesity     HOSPITAL COURSE:   65 year old female patient with history of coronary artery disease, CABG, hypertension, hyperlipidemia, type 2 diabetes mellitus, hypothyroidism presented to the emergency room for fall and in swelling and redness of the right hand secondary to dog bite.  1.MRSA sepsis with Cellulitis of right hand and right forearm s/p dog bite---spread of erythema over the arm -IV vanc and clinda - BC 1/4 positve for MRSA and strep pyogenes -afebrile now -repeat BC from 10/16/7 negative. OK to place PICC line today -positive MRSA PCR ordered. -TEE negative for vegetations -ID consult appreciated. Pt will need PICC line for IV Vanc for 2 weeks (total)  2.Sepsis secondary to cellulitis -fever,low bp and elevated LA -LA now back to normal 1.1  3.Type 2 diabetes mellitus, Mild CKD-II -cont home dose Insulin and Janumet  4.Coronary artery disease -cont  plavix  5.Hypertension -metoprolol,lisinorpil,HCTZ  6.HL On tricor  Will d/c her home after PICC line is place  PCP to monitor BMP/Creat closely CONSULTS OBTAINED:  Treatment Team:  Leonel Ramsay, MD Teodoro Spray, MD  DRUG ALLERGIES:   Allergies  Allergen Reactions  . Codeine Phosphate Other (See Comments)    REACTION: unspecified  . Duloxetine Other (See Comments)    REACTION: unspecified  . Meperidine Hcl Other (See Comments)    REACTION: unspecified  . Norco [Hydrocodone-Acetaminophen] Other (See Comments)    hallucinations  . Venlafaxine Other (See Comments)    REACTION: unspecified  . Doxycycline Rash    DISCHARGE MEDICATIONS:   Current Discharge Medication List    CONTINUE these medications which have NOT CHANGED   Details  bisacodyl (DULCOLAX) 5 MG EC tablet Take 1 tablet (5 mg total) by mouth daily as needed for moderate constipation. Qty: 30 tablet, Refills: 0    cetirizine (ZYRTEC) 10 MG tablet Take 10 mg by mouth at bedtime.    citalopram (CELEXA) 40 MG tablet Take 40 mg by mouth daily.    clopidogrel (PLAVIX) 75 MG tablet Take 1 tablet (75 mg total) by mouth daily with breakfast. Qty: 30 tablet, Refills: 0    fenofibrate (TRICOR) 145 MG tablet Take 145 mg by mouth daily.    hydrochlorothiazide (HYDRODIURIL) 25 MG tablet Take 25 mg by mouth daily.    Insulin Glargine (TOUJEO SOLOSTAR) 300 UNIT/ML SOPN Inject 35 Units into the skin daily.    levothyroxine (SYNTHROID, LEVOTHROID)  100 MCG tablet Take 1 tablet (100 mcg total) by mouth daily. Qty: 90 tablet, Refills: 1    lisinopril (PRINIVIL,ZESTRIL) 20 MG tablet Take 1 tablet (20 mg total) by mouth daily. Take everyday with HCTZ Qty: 90 tablet, Refills: 1    metoprolol tartrate (LOPRESSOR) 25 MG tablet TAKE 1 TABLET TWICE DAILY Qty: 180 tablet, Refills: 3    sitaGLIPtin-metformin (JANUMET) 50-1000 MG per tablet Take 1 tablet by mouth 2 (two) times daily with a meal. Qty: 180 tablet,  Refills: 1    traZODone (DESYREL) 100 MG tablet Take 1 tablet (100 mg total) by mouth at bedtime. Qty: 30 tablet, Refills: 0        If you experience worsening of your admission symptoms, develop shortness of breath, life threatening emergency, suicidal or homicidal thoughts you must seek medical attention immediately by calling 911 or calling your MD immediately  if symptoms less severe.  You Must read complete instructions/literature along with all the possible adverse reactions/side effects for all the Medicines you take and that have been prescribed to you. Take any new Medicines after you have completely understood and accept all the possible adverse reactions/side effects.   Please note  You were cared for by a hospitalist during your hospital stay. If you have any questions about your discharge medications or the care you received while you were in the hospital after you are discharged, you can call the unit and asked to speak with the hospitalist on call if the hospitalist that took care of you is not available. Once you are discharged, your primary care physician will handle any further medical issues. Please note that NO REFILLS for any discharge medications will be authorized once you are discharged, as it is imperative that you return to your primary care physician (or establish a relationship with a primary care physician if you do not have one) for your aftercare needs so that they can reassess your need for medications and monitor your lab values. Today   SUBJECTIVE   Doing well. Just got back from TEE  VITAL SIGNS:  Blood pressure (!) 190/86, pulse 92, temperature 99.1 F (37.3 C), temperature source Oral, resp. rate 13, height 5\' 2"  (1.575 m), weight 119.4 kg (263 lb 4.8 oz), SpO2 91 %.  I/O:   Intake/Output Summary (Last 24 hours) at 04/11/16 1044 Last data filed at 04/11/16 0338  Gross per 24 hour  Intake              120 ml  Output              375 ml  Net              -255 ml    PHYSICAL EXAMINATION:  GENERAL:  65 y.o.-year-old patient lying in the bed with no acute distress.  EYES: Pupils equal, round, reactive to light and accommodation. No scleral icterus. Extraocular muscles intact.  HEENT: Head atraumatic, normocephalic. Oropharynx and nasopharynx clear.  NECK:  Supple, no jugular venous distention. No thyroid enlargement, no tenderness.  LUNGS: Normal breath sounds bilaterally, no wheezing, rales,rhonchi or crepitation. No use of accessory muscles of respiration.  CARDIOVASCULAR: S1, S2 normal. No murmurs, rubs, or gallops.  ABDOMEN: Soft, non-tender, non-distended. Bowel sounds present. No organomegaly or mass.  EXTREMITIES: No pedal edema, cyanosis, or clubbing.  NEUROLOGIC: Cranial nerves II through XII are intact. Muscle strength 5/5 in all extremities. Sensation intact. Gait not checked.  PSYCHIATRIC: The patient is alert and oriented x 3.  SKIN: right UE cellulitis better, dry scabs+  DATA REVIEW:   CBC   Recent Labs Lab 04/11/16 0352  WBC 7.8  HGB 10.3*  HCT 31.1*  PLT 225    Chemistries   Recent Labs Lab 04/08/16 0021  04/11/16 0352  NA 135  < > 139  K 3.9  < > 3.2*  CL 101  < > 107  CO2 25  < > 25  GLUCOSE 221*  < > 118*  BUN 12  < > 17  CREATININE 1.03*  < > 1.26*  CALCIUM 8.7*  < > 8.4*  AST 19  --   --   ALT 16  --   --   ALKPHOS 74  --   --   BILITOT 0.1*  --   --   < > = values in this interval not displayed.  Microbiology Results   Recent Results (from the past 240 hour(s))  Culture, blood (Routine x 2)     Status: None (Preliminary result)   Collection Time: 04/08/16 12:21 AM  Result Value Ref Range Status   Specimen Description BLOOD  Final   Special Requests NONE  Final   Culture  Setup Time   Final    GRAM POSITIVE RODS ANAEROBIC BOTTLE ONLY CRITICAL RESULT CALLED TO, READ BACK BY AND VERIFIED WITH: HANK ZOMPA ON 04/09/16 AT 1518 BY [facility] Performed at [place]  Final   Report Status PENDING  Incomplete  Culture, blood (Routine x 2)     Status: Abnormal   Collection Time: 04/08/16 12:21 AM  Result Value Ref Range Status   Specimen Description BLOOD  Final   Special Requests NONE  Final   Culture  Setup Time   Final    GRAM POSITIVE COCCI IN BOTH AEROBIC AND ANAEROBIC BOTTLES CRITICAL RESULT CALLED TO, READ BACK BY AND VERIFIED WITH: MELISSA Pettibone ON 04/08/16 AT 1551 BY [facility]    Culture (A)  Final    GROUP A STREP (S.PYOGENES) ISOLATED STAPHYLOCOCCUS [facility] NOTIFIED Performed at [facility]    Report Status 04/11/2016 FINAL  Final   Organism ID, Bacteria GROUP A STREP (S.PYOGENES) ISOLATED  Final   Organism ID, Bacteria STAPHYLOCOCCUS [facility]  Final      Susceptibility   Group a strep (s.pyogenes) isolated - MIC*    ERYTHROMYCIN <=0.12 SENSITIVE Sensitive     TETRACYCLINE 0.5 SENSITIVE Sensitive     VANCOMYCIN 0.5 SENSITIVE Sensitive     CLINDAMYCIN <=0.25 SENSITIVE Sensitive     Inducible Clindamycin NEGATIVE Sensitive     * GROUP A STREP (S.PYOGENES) ISOLATED   Staphylococcus aureus - MIC*    CIPROFLOXACIN >=8 RESISTANT Resistant     ERYTHROMYCIN >=8 RESISTANT Resistant     GENTAMICIN <=0.5 SENSITIVE Sensitive     OXACILLIN >=4 RESISTANT Resistant     TETRACYCLINE <=1 SENSITIVE Sensitive     VANCOMYCIN 1 SENSITIVE Sensitive     TRIMETH/SULFA <=10 SENSITIVE Sensitive     CLINDAMYCIN <=0.25 SENSITIVE Sensitive     RIFAMPIN <=0.5 SENSITIVE Sensitive     Inducible Clindamycin NEGATIVE Sensitive     * STAPHYLOCOCCUS AUREUS  Urine culture     Status: Abnormal   Collection Time: 04/08/16 12:21 AM  Result Value Ref Range Status   Specimen Description URINE, RANDOM  Final   Special Requests NONE  Final   Culture MULTIPLE SPECIES PRESENT, SUGGEST RECOLLECTION (A)  Final   Report Status  04/09/2016 FINAL  Final  Blood Culture ID Panel (Reflexed)     Status: Abnormal   Collection Time: 04/08/16  12:21 AM  Result Value Ref Range Status   Enterococcus species NOT DETECTED NOT DETECTED Final   Listeria monocytogenes NOT DETECTED NOT DETECTED Final   Staphylococcus species DETECTED (A) NOT DETECTED Final    Comment: CRITICAL RESULT CALLED TO, READ BACK BY AND VERIFIED WITH: MELISSA MACCIA ON 04/08/16 AT 1551 BY [facility]    Staphylococcus aureus DETECTED (A) NOT DETECTED Final    Comment: MELISSA MACCIA ON 04/08/16 AT 1551 BY [facility]   Methicillin resistance DETECTED (A) NOT DETECTED Final    Comment: MELISSA MACCIA ON 04/08/16 AT 1551 BY [facility]   Streptococcus species DETECTED (A) NOT DETECTED Final    Comment: MELISSA MACCIA ON 04/08/16 AT 1551 BY [facility]   Streptococcus agalactiae NOT DETECTED NOT DETECTED Final   Streptococcus pneumoniae NOT DETECTED NOT DETECTED Final   Streptococcus pyogenes DETECTED (A) NOT DETECTED Final    Comment: MELISSA [place] ON 04/08/16 AT 1551 BY [facility]   Acinetobacter baumannii NOT DETECTED NOT DETECTED Final   Enterobacteriaceae species NOT DETECTED NOT DETECTED Final   Enterobacter cloacae complex NOT DETECTED NOT DETECTED Final   Escherichia coli NOT DETECTED NOT DETECTED Final   Klebsiella oxytoca NOT DETECTED NOT DETECTED Final   Klebsiella pneumoniae NOT DETECTED NOT DETECTED Final   Proteus species NOT DETECTED NOT DETECTED Final   Serratia marcescens NOT DETECTED NOT DETECTED Final   Haemophilus influenzae NOT DETECTED NOT DETECTED Final   Neisseria meningitidis NOT DETECTED NOT DETECTED Final   Pseudomonas aeruginosa NOT DETECTED NOT DETECTED Final   Candida albicans NOT DETECTED NOT DETECTED Final   Candida glabrata NOT DETECTED NOT DETECTED Final   Candida krusei NOT DETECTED NOT DETECTED Final   Candida parapsilosis NOT DETECTED NOT DETECTED Final   Candida tropicalis NOT DETECTED NOT DETECTED Final  Blood Culture ID Panel (Reflexed)     Status: None   Collection Time: 04/08/16 12:21 AM  Result Value Ref Range Status   Enterococcus species NOT  DETECTED NOT DETECTED Final   Vancomycin resistance NOT DETECTED NOT DETECTED Final   Listeria monocytogenes NOT DETECTED NOT DETECTED Final   Staphylococcus species NOT DETECTED NOT DETECTED Final   Staphylococcus aureus NOT DETECTED NOT DETECTED Final   Methicillin resistance NOT DETECTED NOT DETECTED Final   Streptococcus species NOT DETECTED NOT DETECTED Final   Streptococcus agalactiae NOT DETECTED NOT DETECTED Final   Streptococcus pneumoniae NOT DETECTED NOT DETECTED Final   Streptococcus pyogenes NOT DETECTED NOT DETECTED Final   Acinetobacter baumannii NOT DETECTED NOT DETECTED Final   Enterobacteriaceae species NOT DETECTED NOT DETECTED Final   Enterobacter cloacae complex NOT DETECTED NOT DETECTED Final   Escherichia coli NOT DETECTED NOT DETECTED Final   Klebsiella oxytoca NOT DETECTED NOT DETECTED Final   Klebsiella pneumoniae NOT DETECTED NOT DETECTED Final   Proteus species NOT DETECTED NOT DETECTED Final   Serratia marcescens NOT DETECTED NOT DETECTED Final   Carbapenem resistance NOT DETECTED NOT DETECTED Final   Haemophilus influenzae NOT DETECTED NOT DETECTED Final   Neisseria meningitidis NOT DETECTED NOT DETECTED Final   Pseudomonas aeruginosa NOT DETECTED NOT DETECTED Final   Candida albicans NOT DETECTED NOT DETECTED Final   Candida glabrata NOT DETECTED NOT DETECTED Final   Candida krusei NOT DETECTED NOT DETECTED Final   Candida parapsilosis NOT DETECTED NOT DETECTED Final   Candida tropicalis NOT DETECTED NOT DETECTED Final  Blood Culture (  routine x 2)     Status: None (Preliminary result)   Collection Time: 04/08/16  5:31 AM  Result Value Ref Range Status   Specimen Description BLOOD LEFT ASSIST CONTROL  Final   Special Requests BOTTLES DRAWN AEROBIC AND ANAEROBIC 5CC  Final   Culture NO GROWTH 3 DAYS  Final   Report Status PENDING  Incomplete  Blood Culture (routine x 2)     Status: None (Preliminary result)   Collection Time: 04/08/16  5:48 AM  Result  Value Ref Range Status   Specimen Description BLOOD LEFT HAND  Final   Special Requests BOTTLES DRAWN AEROBIC AND ANAEROBIC 5CC  Final   Culture NO GROWTH 3 DAYS  Final   Report Status PENDING  Incomplete  MRSA PCR Screening     Status: Abnormal   Collection Time: 04/08/16 11:24 AM  Result Value Ref Range Status   MRSA by PCR POSITIVE (A) NEGATIVE Final    Comment:        The GeneXpert MRSA Assay (FDA approved for NASAL specimens only), is one component of a comprehensive MRSA colonization surveillance program. It is not intended to diagnose MRSA infection nor to guide or monitor treatment for MRSA infections. RESULT CALLED TO, READ BACK BY AND VERIFIED WITH: AARON CARTER AT [id number] ON 04/08/16.Marland KitchenMarland KitchenEye Surgery Center Of [facility] PLLC   Culture, blood (single) w Reflex to ID Panel     Status: None (Preliminary result)   Collection Time: 04/09/16  9:00 AM  Result Value Ref Range Status   Specimen Description BLOOD LEFT HAND  Final   Special Requests   Final    BOTTLES DRAWN AEROBIC AND ANAEROBIC AER 5ML ANA 1ML   Culture NO GROWTH 2 DAYS  Final   Report Status PENDING  Incomplete    RADIOLOGY:  No results found.   Management plans discussed with the patient, family and they are in agreement.  CODE STATUS:     Code Status Orders        Start     Ordered   04/08/16 0354  Full code  Continuous     04/08/16 0355    Code Status History    Date Active Date Inactive Code Status Order ID Comments User Context   07/29/2015  5:47 AM 08/08/2015  5:32 PM Full Code [id number]  Saundra Shelling, MD Inpatient   07/28/2015 10:34 PM 07/29/2015  5:47 AM Full Code [id number]  Hessie Knows, MD ED      TOTAL TIME TAKING CARE OF THIS PATIENT: 40 minutes.    Tyasia Packard M.D on 04/11/2016 at 10:44 AM  Between 7am to 6pm - Pager - [phone] After 6pm go to [email or web address] - password [email or web address] [place] [facility]  Office  [phone]  CC: Primary care physician; Lenard Simmer, MD

## 2016-04-11 NOTE — Progress Notes (Signed)
Pt's BC positive for diptheroid 1/4. D/w ID no need t o treat. Appears contamination

## 2016-04-11 NOTE — Care Management (Signed)
Patient is off floor currently for TEE which will determine length of IV antibiotic need. [facility] has been arranged with patient for home IV antibiotics. Cost $6.71/day. Per MD patient will have PICC placed today and discharge to home. Vancomycin 1 G BID (time 0800 and 2000). Patient will need to receive evening dose prior to discharge for home health to start in AM.

## 2016-04-11 NOTE — Care Management Important Message (Signed)
Important Message  Patient Details  Name: Anita Small MRN: [id number] Date of Birth: January 07, 1951   Medicare Important Message Given:  Yes    Marshell Garfinkel, RN 04/11/2016, 8:17 AM

## 2016-04-11 NOTE — Discharge Instructions (Signed)
PICC line care per protocol °

## 2016-04-11 NOTE — Progress Notes (Signed)
*  PRELIMINARY RESULTS* Echocardiogram Echocardiogram Transesophageal has been performed.  Anita Small 04/11/2016, 8:51 AM

## 2016-04-11 NOTE — Care Management (Signed)
Met with patient regarding home health nurse arrangement. She would like to use [facility]. I have notified Corene Cornea with [facility] of patient potential discharge to home today.

## 2016-04-11 NOTE — Progress Notes (Signed)
TEE completed. Preliminary result showed no vegetations.

## 2016-04-11 NOTE — Plan of Care (Signed)
Pt discharged to home with friend.  Home health set up to begin this evening.  Discharge paperwork and follow-up information reviewed with patient.  Pt verbalized understanding.  L UA SL PICC placed at [facility], PIV removed.

## 2016-04-11 NOTE — Care Management (Addendum)
RN administering IV antibiotic now (which delays evening dose to around 2200). PICC has not been placed as of yet. Spoke with RN and asked if we could hold evening dose until early AM with Home Health RN- she will ask Dr. Ola Spurr. Otherwise, patient will need to receive her evening dose prior to discharging from [facility]. Received call from Dr. Fritzi Mandes "Vanco trough level will need to be drawn by [facility] tomorrow morning prior to Vanco dose; evening dose held due to renal labs". Corene Cornea with [facility] notified. No further RNCM needs.

## 2016-04-11 NOTE — Progress Notes (Addendum)
Pharmacy Antibiotic Note  Anita Small is a 65 y.o. female admitted on 04/07/2016 with sepsis and cellulitis.  Pharmacy has been consulted for vancomycin dosing.  Plan: Pt received vancomycin 1 gm IV x 1 followed in 6 hours by vancomycin 1gm IV Q12H.  10/18 pt is being discharged with PICC line on vancomycin 1 g IV q 12 hours (stop date 10/30). Due to decreasing renal function and pts weight, patient is likely to accumulate vancomycin. Stressed importance of getting a trough level tonight prior to vanc dose and home health will adjust appropriately. Ordered SCr and vanc trough tonight in case pt is still here for 2000 dose  Height: 5\' 2"  (157.5 cm) Weight: 263 lb 4.8 oz (119.4 kg) IBW/kg (Calculated) : 50.1  Temp (24hrs), Avg:98.8 F (37.1 C), Min:98.5 F (36.9 C), Max:99.1 F (37.3 C)   Recent Labs Lab 04/08/16 0021 04/08/16 0531 04/09/16 0722 04/09/16 1945 04/11/16 0352  WBC 11.7* 10.9  --   --  7.8  CREATININE 1.03* 1.03*  --  1.19* 1.26*  LATICACIDVEN 2.2* 2.4* 1.1  --   --   VANCOTROUGH  --   --   --  15  --   VANCORANDOM  --   --   --   --  24    Estimated Creatinine Clearance: 53.2 mL/min (by C-G formula based on SCr of 1.26 mg/dL (H)).    Allergies  Allergen Reactions  . Codeine Phosphate Other (See Comments)    REACTION: unspecified  . Duloxetine Other (See Comments)    REACTION: unspecified  . Meperidine Hcl Other (See Comments)    REACTION: unspecified  . Norco [Hydrocodone-Acetaminophen] Other (See Comments)    hallucinations  . Venlafaxine Other (See Comments)    REACTION: unspecified  . Doxycycline Rash    Thank you for allowing pharmacy to be a part of this patient's care.  Darrow Bussing, PharmD Pharmacy Resident 04/11/2016 5:45 PM

## 2016-04-11 NOTE — CV Procedure (Addendum)
   TRANSESOPHAGEAL ECHOCARDIOGRAM   NAME:  [name]   MRN: [id number] DOB:  12/27/1950   ADMIT DATE: 04/07/2016  INDICATIONS:   PROCEDURE:   Informed consent was obtained prior to the procedure. The risks, benefits and alternatives for the procedure were discussed and the patient comprehended these risks.  Risks include, but are not limited to, cough, sore throat, vomiting, nausea, somnolence, esophageal and stomach trauma or perforation, bleeding, low blood pressure, aspiration, pneumonia, infection, trauma to the teeth and death.    After a procedural time-out, the patient was given 3 mg versed and 75 mcg fentanyl for moderate sedation.  The oropharynx was anesthetized 1 cc of topical 1% viscous lidocaine.  The transesophageal probe was inserted in the esophagus and stomach without difficulty and multiple views were obtained.    COMPLICATIONS:    There were no immediate complications.  FINDINGS:  LEFT VENTRICLE: EF = 55%. No regional wall motion abnormalities.  RIGHT VENTRICLE: Normal size and function.   LEFT ATRIUM: Normal size.   LEFT ATRIAL APPENDAGE: No thrombus.   RIGHT ATRIUM: Normal  AORTIC VALVE:  Trileaflet. Mildly thickened. No vegetations noted.   MITRAL VALVE:    Normal. Mild mr. No vegetations  TRICUSPID VALVE: Normal. Mild tr. No vegetations.  PULMONIC VALVE: Grossly normal.  INTERATRIAL SEPTUM: No PFO or ASD. Agitated saline used. No evidence of right to left shunt  PERICARDIUM: No effusion  DESCENDING AORTA:   CONCLUSION: No evidence of vegetations. Cannot rule out endocarditis.

## 2016-04-12 ENCOUNTER — Encounter: Payer: Self-pay | Admitting: Cardiology

## 2016-04-13 LAB — CULTURE, BLOOD (ROUTINE X 2)
CULTURE: NO GROWTH
Culture: NO GROWTH

## 2016-04-14 LAB — CULTURE, BLOOD (SINGLE): CULTURE: NO GROWTH

## 2016-08-19 ENCOUNTER — Emergency Department
Admission: EM | Admit: 2016-08-19 | Discharge: 2016-08-19 | Disposition: A | Discharge status: Home / Self Care | Payer: Medicare HMO | Attending: Emergency Medicine | Admitting: Emergency Medicine

## 2016-08-19 ENCOUNTER — Emergency Department: Payer: Medicare HMO

## 2016-08-19 DIAGNOSIS — I2510 Atherosclerotic heart disease of native coronary artery without angina pectoris: Secondary | ICD-10-CM | POA: Diagnosis not present

## 2016-08-19 DIAGNOSIS — I10 Essential (primary) hypertension: Secondary | ICD-10-CM | POA: Diagnosis not present

## 2016-08-19 DIAGNOSIS — B373 Candidiasis of vulva and vagina: Secondary | ICD-10-CM | POA: Diagnosis not present

## 2016-08-19 DIAGNOSIS — N2889 Other specified disorders of kidney and ureter: Secondary | ICD-10-CM | POA: Diagnosis not present

## 2016-08-19 DIAGNOSIS — E119 Type 2 diabetes mellitus without complications: Secondary | ICD-10-CM | POA: Insufficient documentation

## 2016-08-19 DIAGNOSIS — R1032 Left lower quadrant pain: Secondary | ICD-10-CM | POA: Diagnosis present

## 2016-08-19 DIAGNOSIS — Z79899 Other long term (current) drug therapy: Secondary | ICD-10-CM | POA: Diagnosis not present

## 2016-08-19 DIAGNOSIS — Z955 Presence of coronary angioplasty implant and graft: Secondary | ICD-10-CM | POA: Diagnosis not present

## 2016-08-19 DIAGNOSIS — Z87891 Personal history of nicotine dependence: Secondary | ICD-10-CM | POA: Diagnosis not present

## 2016-08-19 DIAGNOSIS — E039 Hypothyroidism, unspecified: Secondary | ICD-10-CM | POA: Diagnosis not present

## 2016-08-19 DIAGNOSIS — B3731 Acute candidiasis of vulva and vagina: Secondary | ICD-10-CM

## 2016-08-19 LAB — URINALYSIS, COMPLETE (UACMP) WITH MICROSCOPIC
BILIRUBIN URINE: NEGATIVE
Bacteria, UA: NONE SEEN
Hgb urine dipstick: NEGATIVE
KETONES UR: 20 mg/dL — AB
LEUKOCYTES UA: NEGATIVE
NITRITE: NEGATIVE
PH: 6 (ref 5.0–8.0)
Protein, ur: 30 mg/dL — AB
SPECIFIC GRAVITY, URINE: 1.035 — AB (ref 1.005–1.030)

## 2016-08-19 LAB — COMPREHENSIVE METABOLIC PANEL
ALK PHOS: 126 U/L (ref 38–126)
ALT: 13 U/L — AB (ref 14–54)
AST: 20 U/L (ref 15–41)
Albumin: 3.5 g/dL (ref 3.5–5.0)
Anion gap: 15 (ref 5–15)
BILIRUBIN TOTAL: 0.5 mg/dL (ref 0.3–1.2)
BUN: 15 mg/dL (ref 6–20)
CALCIUM: 8.7 mg/dL — AB (ref 8.9–10.3)
CO2: 23 mmol/L (ref 22–32)
CREATININE: 1.35 mg/dL — AB (ref 0.44–1.00)
Chloride: 92 mmol/L — ABNORMAL LOW (ref 101–111)
GFR calc Af Amer: 46 mL/min — ABNORMAL LOW (ref >60)
GFR, EST NON AFRICAN AMERICAN: 40 mL/min — AB (ref >60)
Glucose, Bld: 530 mg/dL (ref 65–99)
Potassium: 4 mmol/L (ref 3.5–5.1)
Sodium: 130 mmol/L — ABNORMAL LOW (ref 135–145)
TOTAL PROTEIN: 7.7 g/dL (ref 6.5–8.1)

## 2016-08-19 LAB — CBC
HCT: 39.9 % (ref 35.0–47.0)
Hemoglobin: 13.4 g/dL (ref 12.0–16.0)
MCH: 26.7 pg (ref 26.0–34.0)
MCHC: 33.4 g/dL (ref 32.0–36.0)
MCV: 80 fL (ref 80.0–100.0)
PLATELETS: 355 10*3/uL (ref 150–440)
RBC: 4.99 MIL/uL (ref 3.80–5.20)
RDW: 16.3 % — AB (ref 11.5–14.5)
WBC: 8 10*3/uL (ref 3.6–11.0)

## 2016-08-19 LAB — TROPONIN I: Troponin I: <0.03 ng/mL (ref <0.03)

## 2016-08-19 LAB — GLUCOSE, CAPILLARY
GLUCOSE-CAPILLARY: 490 mg/dL — AB (ref 65–99)
GLUCOSE-CAPILLARY: 414 mg/dL — AB (ref 65–99)
Glucose-Capillary: 328 mg/dL — ABNORMAL HIGH (ref 65–99)

## 2016-08-19 LAB — LIPASE, BLOOD: Lipase: 39 U/L (ref 11–51)

## 2016-08-19 MED ORDER — MORPHINE SULFATE (PF) 2 MG/ML IV SOLN
2.0000 mg | Freq: Once | INTRAVENOUS | Status: AC
  Administered 2016-08-19: 2 mg via INTRAVENOUS
  Filled 2016-08-19: qty 1

## 2016-08-19 MED ORDER — INSULIN GLARGINE 100 UNIT/ML ~~LOC~~ SOLN
25.0000 [IU] | Freq: Once | SUBCUTANEOUS | Status: AC
  Administered 2016-08-19: 25 [IU] via SUBCUTANEOUS
  Filled 2016-08-19: qty 0.25

## 2016-08-19 MED ORDER — SITAGLIPTIN PHOS-METFORMIN HCL 50-1000 MG PO TABS: 1.0000 | ORAL_TABLET | Freq: Two times a day (BID) | ORAL | 0 refills | Status: DC

## 2016-08-19 MED ORDER — IOPAMIDOL (ISOVUE-300) INJECTION 61%
30.0000 mL | Freq: Once | INTRAVENOUS | Status: AC | PRN
  Administered 2016-08-19: 30 mL via ORAL

## 2016-08-19 MED ORDER — ONDANSETRON HCL 4 MG/2ML IJ SOLN
4.0000 mg | Freq: Once | INTRAMUSCULAR | Status: AC
  Administered 2016-08-19: 4 mg via INTRAVENOUS
  Filled 2016-08-19: qty 2

## 2016-08-19 MED ORDER — MICONAZOLE NITRATE 200 MG VA SUPP: 200.0000 mg | Freq: Every day | VAGINAL | 0 refills | Status: DC

## 2016-08-19 MED ORDER — IOPAMIDOL (ISOVUE-300) INJECTION 61%
100.0000 mL | Freq: Once | INTRAVENOUS | Status: AC | PRN
  Administered 2016-08-19: 100 mL via INTRAVENOUS

## 2016-08-19 MED ORDER — INSULIN ASPART 100 UNIT/ML ~~LOC~~ SOLN: 8.0000 [IU] | Freq: Once | SUBCUTANEOUS | Status: DC

## 2016-08-19 MED ORDER — SODIUM CHLORIDE 0.9 % IV BOLUS (SEPSIS)
1000.0000 mL | Freq: Once | INTRAVENOUS | Status: AC
  Administered 2016-08-19: 1000 mL via INTRAVENOUS

## 2016-08-19 MED ORDER — INSULIN GLARGINE 300 UNIT/ML ~~LOC~~ SOPN: 35.0000 [IU] | PEN_INJECTOR | Freq: Every day | SUBCUTANEOUS | 0 refills | Status: DC

## 2016-08-19 MED ORDER — INSULIN ASPART 100 UNIT/ML ~~LOC~~ SOLN
5.0000 [IU] | Freq: Once | SUBCUTANEOUS | Status: AC
  Administered 2016-08-19: 5 [IU] via INTRAVENOUS
  Filled 2016-08-19: qty 5

## 2016-08-19 MED ORDER — METFORMIN HCL 500 MG PO TABS
1000.0000 mg | ORAL_TABLET | Freq: Once | ORAL | Status: AC
  Administered 2016-08-19: 1000 mg via ORAL
  Filled 2016-08-19: qty 2

## 2016-08-19 MED ORDER — INSULIN ASPART 100 UNIT/ML ~~LOC~~ SOLN: 4.0000 [IU] | Freq: Once | SUBCUTANEOUS | Status: DC

## 2016-08-19 NOTE — ED Notes (Signed)
Patient assisted to room commode. 

## 2016-08-19 NOTE — ED Notes (Signed)
Patient given ED sandwich tray and diet Coke.

## 2016-08-19 NOTE — ED Notes (Signed)
E signature pad not working 

## 2016-08-19 NOTE — ED Triage Notes (Signed)
Blood sugar 530. Dr Clearnce Hasten notified.

## 2016-08-19 NOTE — Discharge Instructions (Signed)
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  Please make sure to fill and utilize diabetes prescriptions. Please check your blood sugar 3-4 times daily.  Return to the ER if you have a fever, vomiting, severe weakness, develop chest pain, feel like you may pass out, have trouble urinating, feel your symptoms are not improving within 24-48 hours.

## 2016-08-19 NOTE — ED Notes (Signed)
Patient assisted to room commode. Patient c/o dizziness when standing, but has a steady gait with stand-by assist.

## 2016-08-19 NOTE — ED Notes (Signed)
Gave patient ice chips at this time.

## 2016-08-19 NOTE — ED Triage Notes (Signed)
Pt c/o generalized abd pain with nausea for the past week and increased pain in the left hip with no injury.Anita Small

## 2016-08-19 NOTE — ED Provider Notes (Signed)
[facility] Emergency Department Provider Note   ____________________________________________   First MD Initiated Contact with Patient 08/19/16 1220     (approximate)  I have reviewed the triage vital signs and the nursing notes.   HISTORY  Chief Complaint Abdominal Pain    HPI Anita Small is a 66 y.o. female porches been having mild nausea, left-sided pain along her left ribs all the way down to her left pelvis. Decreased appetite, and feeling fatigued for about the last day and a half.  Patient reports "it feels like a have appendicitis, but it's on the left side".  Patient also reports that she has been feeling "fuzzy" for last few days being ill. She is questioning that she may not of refilled her diabetes medicine, then after some reflection reports she probably has not taken her diabetes medicine for a week as she ran out. She does use Lantus each evening though, and continues to be compliant with this She reports a burning sensation with urination.   Currently reports a moderate to severe left flank pain, worse with movement.   Past Medical History:  Diagnosis Date  . Adenomatous polyp 1999  . Anxiety   . Arthritis    Left knee  . CAD (coronary artery disease) of artery bypass graft    a. NSTEMI in 07/2015: Cath showed 99% stenosis of distal LCx  --> DES placed  . Depression    Paranoid tendencies  . Diabetes mellitus    Type II  . Diverticulosis   . GERD (gastroesophageal reflux disease)   . Hyperlipidemia   . Hypertension   . Hypothyroidism   . Obesity     Patient Active Problem List   Diagnosis Date Noted  . Cellulitis 04/08/2016  . NSTEMI (non-ST elevated myocardial infarction) ([place])   . Fall 07/29/2015  . Fracture of left humerus 07/28/2015  . Femur fracture, left ([place]) 07/28/2015  . Elevated troponin 07/28/2015  . Routine general medical examination at a health care facility 09/19/2010  . HLD (hyperlipidemia)  09/19/2010  . CAROTID ARTERY STENOSIS 07/25/2009  . Type 2 diabetes mellitus ([place]) 11/01/2006  . ANXIETY 11/01/2006  . Hypothyroidism 10/31/2006  . OBESITY 10/31/2006  . Episodic mood disorder ([place]) 10/31/2006  . Essential hypertension 10/31/2006  . GERD 10/31/2006  . Osteoarthritis, multiple sites 10/31/2006    Past Surgical History:  Procedure Laterality Date  . ABDOMINAL HYSTERECTOMY  1977   One ovary left  . APPENDECTOMY    . BLADDER REPAIR  1977   Tacking  . CARDIAC CATHETERIZATION N/A 08/01/2015   Procedure: Left Heart Cath and Coronary Angiography;  Surgeon: Wellington Hampshire, MD;  Location: [facility];  Service: Cardiovascular;  Laterality: N/A;  . CARDIAC CATHETERIZATION N/A 08/01/2015   Procedure: Coronary Stent Intervention;  Surgeon: Wellington Hampshire, MD;  Location: [facility];  Service: Cardiovascular;  Laterality: N/A;  . CARDIOVASCULAR STRESS TEST  7/00   negative. No cardiolite  . CAROTID ENDARTERECTOMY  2/11   Dr Jamal Collin  . [place]  . CHOLECYSTECTOMY    . CT ABD W & PELVIS WO CM  2002   Negative. Pituitary normal on MRI  . HEEL SPUR SURGERY  03/03   Left  . INTRAMEDULLARY (IM) NAIL INTERTROCHANTERIC Left 08/04/2015   Procedure: INTRAMEDULLARY (IM) NAIL INTERTROCHANTRIC;  Surgeon: Hessie Knows, MD;  Location: [facility];  Service: Orthopedics;  Laterality: Left;  . KNEE ARTHROSCOPY  2001   Left   (  [place])  . TEE WITHOUT CARDIOVERSION N/A 04/11/2016   Procedure: TRANSESOPHAGEAL ECHOCARDIOGRAM (TEE);  Surgeon: Teodoro Spray, MD;  Location: [facility];  Service: Cardiovascular;  Laterality: N/A;  . TOTAL KNEE ARTHROPLASTY  11/12   left--Dr Tamala Julian  . TRANSTHORACIC ECHOCARDIOGRAM  3/04   normal    Prior to Admission medications   Medication Sig Start Date End Date Taking? Authorizing Provider  citalopram (CELEXA) 40 MG tablet Take 40 mg by mouth daily.   Yes Historical Provider, MD  clopidogrel (PLAVIX) 75 MG tablet Take 1 tablet  (75 mg total) by mouth daily with breakfast. 08/08/15  Yes Vaughan Basta, MD  hydrochlorothiazide (HYDRODIURIL) 25 MG tablet Take 25 mg by mouth daily.   Yes Historical Provider, MD  levothyroxine (SYNTHROID, LEVOTHROID) 100 MCG tablet Take 1 tablet (100 mcg total) by mouth daily. 02/25/14  Yes Venia Carbon, MD  metoprolol tartrate (LOPRESSOR) 25 MG tablet TAKE 1 TABLET TWICE DAILY 12/05/15  Yes Wellington Hampshire, MD  traZODone (DESYREL) 100 MG tablet Take 1 tablet (100 mg total) by mouth at bedtime. 08/08/15  Yes Vaughan Basta, MD  bisacodyl (DULCOLAX) 5 MG EC tablet Take 1 tablet (5 mg total) by mouth daily as needed for moderate constipation. Patient not taking: Reported on 08/19/2016 08/08/15   Vaughan Basta, MD  cetirizine (ZYRTEC) 10 MG tablet Take 10 mg by mouth at bedtime.    Historical Provider, MD  fenofibrate (TRICOR) 145 MG tablet Take 145 mg by mouth daily.    Historical Provider, MD  Insulin Glargine (TOUJEO SOLOSTAR) 300 UNIT/ML SOPN Inject 35 Units into the skin daily. 08/19/16   Delman Kitten, MD  lisinopril (PRINIVIL,ZESTRIL) 20 MG tablet Take 1 tablet (20 mg total) by mouth daily. Take everyday with HCTZ Patient not taking: Reported on 08/19/2016 02/25/14   Venia Carbon, MD  miconazole (MICONAZOLE 3) 200 MG vaginal suppository Place 1 suppository (200 mg total) vaginally at bedtime. 08/19/16   Delman Kitten, MD  sitaGLIPtin-metformin (JANUMET) 50-1000 MG tablet Take 1 tablet by mouth 2 (two) times daily with a meal. 08/19/16   Delman Kitten, MD    Allergies Codeine phosphate; Duloxetine; Meperidine hcl; Norco [hydrocodone-acetaminophen]; Venlafaxine; and Doxycycline  Family History  Problem Relation Age of Onset  . Cancer Mother     Breast  . Heart disease Mother   . Diabetes Mother   . Colon polyps Mother   . Heart disease Father   . Diabetes Father   . Hypertension Sister   . Diabetes Sister   . Depression Daughter   . Cancer Paternal Grandmother     ?  throat CA  . Colon cancer      Unsure if mother had it     Social History Social History  Substance Use Topics  . Smoking status: Former Smoker    Types: Cigarettes  . Smokeless tobacco: Never Used  . Alcohol use Yes     Comment: rarely    Review of Systems Constitutional: No fever/chills Eyes: No visual changes. ENT: No sore throat. Cardiovascular: Denies chest pain. Respiratory: Denies shortness of breath. Gastrointestinal:  No vomiting.No diarrhea.  No constipation. Genitourinary:  No burning sensation with urination. Musculoskeletal: Negative for back pain. Skin: Negative for rash. Neurological: Negative for headaches, focal weakness or numbness.  Prior hysterectomy  10-point ROS otherwise negative.  ____________________________________________   PHYSICAL EXAM:  VITAL SIGNS: ED Triage Vitals  Enc Vitals Group     BP 08/19/16 1054 (!) 146/102     Pulse  Rate 08/19/16 1054 97     Resp 08/19/16 1054 18     Temp 08/19/16 1054 98.3 F (36.8 C)     Temp Source 08/19/16 1054 Oral     SpO2 08/19/16 1054 99 %     Weight --      Height --      Head Circumference --      Peak Flow --      Pain Score 08/19/16 1050 7     Pain Loc --      Pain Edu? --      Excl. in [place]? --     Constitutional: Alert and oriented. Well appearing and in no acute distressAnd quite pleasant . Eyes: Conjunctivae are normal. PERRL. EOMI. Head: Atraumatic. Nose: No congestion/rhinnorhea. Mouth/Throat: Mucous membranes are moist.  Oropharynx non-erythematous. Neck: No stridor.   Cardiovascular: Normal rate, regular rhythm. Grossly normal heart sounds.  Good peripheral circulation. Respiratory: Normal respiratory effort.  No retractions. Lungs CTAB. Gastrointestinal: Soft and  no right-sided tenderness, has moderate tenderness throughout the left flank, left lower quadrant, mild tenderness in the left upper quadrant as well with minimal rebound.  No distention. No abdominal bruits. No CVA  tenderness. Musculoskeletal: No lower extremity tenderness nor edema.  No joint effusions. left hip joint appears normal to external visualization, no ranges the hip without pain. No rash. Moves both lower extremity is well without difficulty. Neurologic:  Normal speech and language. No gross focal neurologic deficits are appreciated. Skin:  Skin is warm, dry and intact. No rash noted. Psychiatric: Mood and affect are normal. Speech and behavior are normal.  ____________________________________________   LABS (all labs ordered are listed, but only abnormal results are displayed)  Labs Reviewed  COMPREHENSIVE METABOLIC PANEL - Abnormal; Notable for the following:       Result Value   Sodium 130 (*)    Chloride 92 (*)    Glucose, Bld 530 (*)    Creatinine, Ser 1.35 (*)    Calcium 8.7 (*)    ALT 13 (*)    GFR calc non Af Amer 40 (*)    GFR calc Af Amer 46 (*)    All other components within normal limits  CBC - Abnormal; Notable for the following:    RDW 16.3 (*)    All other components within normal limits  URINALYSIS, COMPLETE (UACMP) WITH MICROSCOPIC - Abnormal; Notable for the following:    Color, Urine YELLOW (*)    APPearance CLEAR (*)    Specific Gravity, Urine 1.035 (*)    Glucose, UA >=500 (*)    Ketones, ur 20 (*)    Protein, ur 30 (*)    Squamous Epithelial / LPF 0-5 (*)    All other components within normal limits  GLUCOSE, CAPILLARY - Abnormal; Notable for the following:    Glucose-Capillary 490 (*)    All other components within normal limits  GLUCOSE, CAPILLARY - Abnormal; Notable for the following:    Glucose-Capillary 414 (*)    All other components within normal limits  GLUCOSE, CAPILLARY - Abnormal; Notable for the following:    Glucose-Capillary 328 (*)    All other components within normal limits  LIPASE, BLOOD  TROPONIN I  CBG MONITORING, ED  CBG MONITORING, ED  CBG MONITORING, ED  CBG MONITORING, ED  CBG MONITORING, ED  CBG MONITORING, ED    ____________________________________________  EKG  Reviewed injury by me at 1410 Heart rate 96 Triaged 100 QTc 510 Nonspecific T-wave abnormality, no  evidence of acute ST elevation ____________________________________________  RADIOLOGY  Dg Chest 2 View  Result Date: 08/19/2016 CLINICAL DATA:  Pt states recent onset left flank pain, left lower ribs pain extends inferiorly. Hx/o CAD and HTN. Former smoker. EXAM: CHEST  2 VIEW COMPARISON:  04/11/2016 FINDINGS: Cardiac silhouette is normal in size and configuration. Normal mediastinal and hilar contours. Clear lungs.  No pleural effusion or pneumothorax. Chronic resorption of the left humeral head is stable. Skeletal structures are demineralized. IMPRESSION: No acute cardiopulmonary disease. Electronically Signed   By: Lajean Manes M.D.   On: 08/19/2016 13:23   Ct Abdomen Pelvis W Contrast  Result Date: 08/19/2016 CLINICAL DATA:  Pt c/o generalized abd pain with nausea for the past week and increased pain in the left hip with no injury.Hx of chole, appy and hyster EXAM: CT ABDOMEN AND PELVIS WITH CONTRAST TECHNIQUE: Multidetector CT imaging of the abdomen and pelvis was performed using the standard protocol following bolus administration of intravenous contrast. CONTRAST:  154mL ISOVUE-300 IOPAMIDOL (ISOVUE-300) INJECTION 61% COMPARISON:  None. FINDINGS: Lower chest: No acute abnormality. Hepatobiliary: There is mild central volume loss and subtle surface nodularity along the left liver lobe. These findings raise the possibility of cirrhosis. There is no liver mass or focal lesion. Gallbladder surgically absent. No bile duct dilation. Pancreas: Unremarkable. No pancreatic ductal dilatation or surrounding inflammatory changes. Spleen: Choose 1 Adrenals/Urinary Tract: There is an ill-defined mass arising from the mid to upper pole the left kidney measuring 6.0 x 6.6 x 6.1 cm. Mass is contiguous with an enlarged heterogeneous adrenal gland which  measures approximately 3.1 x 3.9 cm. There is mild dilation of the lower pole renal calices. Overall, there is less enhancement of the left kidney with delayed excretion. There is delayed opacification of the left renal vein but no convincing renal vein thrombus or tumor. A small stone lies in the lower pole the left kidney. No right renal masses, stones or hydronephrosis. Both ureters are normal course and in caliber. Bladder is unremarkable. Stomach/Bowel: Stomach and small bowel are unremarkable. There are numerous colonic diverticula. No diverticulitis. No other colon abnormality. Normal appendix not discretely seen. Vascular/Lymphatic: No discrete enlarged retroperitoneal or intraperitoneal lymph nodes. Atherosclerotic calcifications are noted along a normal caliber abdominal aorta. Reproductive: Status post hysterectomy. No adnexal masses. Other: Small fat containing umbilical hernia.  No ascites. Musculoskeletal: Status post ORIF of a proximal left femur fracture. The visualized orthopedic hardware is well seated. Mild compression fracture of L1, which appears old. No osteoblastic or osteolytic lesions. IMPRESSION: 1. 6.6 cm, heterogeneous left mid to upper pole renal mass highly suspicious for a renal cell carcinoma. This involves the left renal hilar structures, leading to a relative decrease in left renal enhancement as well as delayed excretion. The renal mass appears to invade and enlarged the left adrenal gland. There is no convincing extension of the mass beyond Gerota's fascia. No convincing renal vein tumor and no discrete enlarged retroperitoneal or intraperitoneal lymph nodes. 2. No acute abnormalities within the abdomen or pelvis. 3. There morphologic changes of the liver raising the possibility of cirrhosis. No liver mass or focal lesion. 4. Status post hysterectomy and cholecystectomy. 5. Colonic diverticulosis without evidence of diverticulitis. 6. Aortic atherosclerosis. Electronically Signed    By: Lajean Manes M.D.   On: 08/19/2016 14:22    ____________________________________________   PROCEDURES  Procedure(s) performed: None  Procedures  Critical Care performed: No  ____________________________________________   INITIAL IMPRESSION / ASSESSMENT AND PLAN / ED COURSE  Pertinent labs & imaging results that were available during my care of the patient were reviewed by me and considered in my medical decision making (see chart for details).  Patient presents for evaluation of left flank discomfort and elevated blood sugars. He reports she felt slightly nauseated, has had a moderate discomfort in the left flank. No associated chest pain or pulmonary symptoms.  She does demonstrate left-sided pain and discomfort primarily in the mid flank. Afebrile, reassuring labs except for notably elevated glucose. In further discussing with the patient, she reports that she recently left her primary doctor because she was not satisfied and off for the last week has not been taking any of her prescribed diabetes medicine including her insulin she ran out.  She's also been having vaginal itching, and reports that she has had the same symptoms with "yeast infections" any times in the past just requesting a prescription to treat this as well.  CT scan performed, no evidence of acute infection, but she does have concerning tumor on her kidney. Discussed case with Dr. Junious Silk, he recommends close follow-up with them at urology and the patient is agreeable.  ----------------------------------------- 5:57 PM on 08/19/2016 -----------------------------------------    Patient reports feeling much better. She is awake and alert, eating a sandwich and feeling much improved. I will provide her prescriptions for an cover this evening her diabetes medications. She is agreeable to being compliant, checking her blood sugars 3-4 times daily, and following up with urology. In the interim she will  recontact her primary work to establish primary care physician follow-up.  Abdomen is now soft, nontender, nondistended. She sitting upright. Conversant in no distress. Reports she feels well and would like to go home  Return precautions and treatment recommendations and follow-up discussed with the patient who is agreeable with the plan.       ____________________________________________   FINAL CLINICAL IMPRESSION(S) / ED DIAGNOSES  Final diagnoses:  Renal mass  Vagina, candidiasis  Diabetes mellitus type II, non insulin dependent (HCC)      NEW MEDICATIONS STARTED DURING THIS VISIT:  New Prescriptions   MICONAZOLE (MICONAZOLE 3) 200 MG VAGINAL SUPPOSITORY    Place 1 suppository (200 mg total) vaginally at bedtime.     Note:  This document was prepared using Dragon voice recognition software and may include unintentional dictation errors.     Delman Kitten, MD 08/19/16 Carollee Massed

## 2016-08-19 NOTE — ED Notes (Signed)
Patient left for CT scan. 

## 2016-08-19 NOTE — ED Notes (Signed)
Waiting for meds from pharmacy. 

## 2016-08-22 ENCOUNTER — Emergency Department
Admission: EM | Admit: 2016-08-22 | Discharge: 2016-08-22 | Disposition: A | Discharge status: Home / Self Care | Payer: Medicare HMO | Attending: Emergency Medicine | Admitting: Emergency Medicine

## 2016-08-22 DIAGNOSIS — Z951 Presence of aortocoronary bypass graft: Secondary | ICD-10-CM | POA: Insufficient documentation

## 2016-08-22 DIAGNOSIS — N2889 Other specified disorders of kidney and ureter: Secondary | ICD-10-CM | POA: Diagnosis not present

## 2016-08-22 DIAGNOSIS — Z87891 Personal history of nicotine dependence: Secondary | ICD-10-CM | POA: Insufficient documentation

## 2016-08-22 DIAGNOSIS — Z794 Long term (current) use of insulin: Secondary | ICD-10-CM | POA: Diagnosis not present

## 2016-08-22 DIAGNOSIS — E1165 Type 2 diabetes mellitus with hyperglycemia: Secondary | ICD-10-CM | POA: Diagnosis not present

## 2016-08-22 DIAGNOSIS — Z79899 Other long term (current) drug therapy: Secondary | ICD-10-CM | POA: Diagnosis not present

## 2016-08-22 DIAGNOSIS — E039 Hypothyroidism, unspecified: Secondary | ICD-10-CM | POA: Diagnosis not present

## 2016-08-22 DIAGNOSIS — R109 Unspecified abdominal pain: Secondary | ICD-10-CM | POA: Diagnosis present

## 2016-08-22 DIAGNOSIS — I10 Essential (primary) hypertension: Secondary | ICD-10-CM | POA: Insufficient documentation

## 2016-08-22 DIAGNOSIS — I2510 Atherosclerotic heart disease of native coronary artery without angina pectoris: Secondary | ICD-10-CM | POA: Diagnosis not present

## 2016-08-22 LAB — URINALYSIS, COMPLETE (UACMP) WITH MICROSCOPIC
BACTERIA UA: NONE SEEN
Bilirubin Urine: NEGATIVE
Glucose, UA: >=500 mg/dL — AB
Hgb urine dipstick: NEGATIVE
KETONES UR: 5 mg/dL — AB
Leukocytes, UA: NEGATIVE
Nitrite: NEGATIVE
PROTEIN: 30 mg/dL — AB
RBC / HPF: NONE SEEN RBC/hpf (ref 0–5)
Specific Gravity, Urine: 1.023 (ref 1.005–1.030)
pH: 7 (ref 5.0–8.0)

## 2016-08-22 LAB — COMPREHENSIVE METABOLIC PANEL
ALT: 11 U/L — AB (ref 14–54)
AST: 16 U/L (ref 15–41)
Albumin: 3.4 g/dL — ABNORMAL LOW (ref 3.5–5.0)
Alkaline Phosphatase: 98 U/L (ref 38–126)
Anion gap: 11 (ref 5–15)
BUN: 14 mg/dL (ref 6–20)
CHLORIDE: 98 mmol/L — AB (ref 101–111)
CO2: 24 mmol/L (ref 22–32)
CREATININE: 0.97 mg/dL (ref 0.44–1.00)
Calcium: 9.5 mg/dL (ref 8.9–10.3)
GFR calc non Af Amer: 59 mL/min — ABNORMAL LOW (ref >60)
Glucose, Bld: 372 mg/dL — ABNORMAL HIGH (ref 65–99)
POTASSIUM: 3.5 mmol/L (ref 3.5–5.1)
SODIUM: 133 mmol/L — AB (ref 135–145)
Total Bilirubin: 0.5 mg/dL (ref 0.3–1.2)
Total Protein: 7.3 g/dL (ref 6.5–8.1)

## 2016-08-22 LAB — CBC
HEMATOCRIT: 38.2 % (ref 35.0–47.0)
HEMOGLOBIN: 12.6 g/dL (ref 12.0–16.0)
MCH: 26.2 pg (ref 26.0–34.0)
MCHC: 33 g/dL (ref 32.0–36.0)
MCV: 79.5 fL — AB (ref 80.0–100.0)
Platelets: 305 10*3/uL (ref 150–440)
RBC: 4.8 MIL/uL (ref 3.80–5.20)
RDW: 16.2 % — ABNORMAL HIGH (ref 11.5–14.5)
WBC: 7.3 10*3/uL (ref 3.6–11.0)

## 2016-08-22 LAB — LIPASE, BLOOD: LIPASE: 48 U/L (ref 11–51)

## 2016-08-22 MED ORDER — DIPHENHYDRAMINE HCL 50 MG/ML IJ SOLN
12.5000 mg | Freq: Once | INTRAMUSCULAR | Status: AC
  Administered 2016-08-22: 12.5 mg via INTRAVENOUS
  Filled 2016-08-22: qty 1

## 2016-08-22 MED ORDER — ONDANSETRON HCL 4 MG/2ML IJ SOLN
4.0000 mg | Freq: Once | INTRAMUSCULAR | Status: AC
  Administered 2016-08-22: 4 mg via INTRAVENOUS
  Filled 2016-08-22: qty 2

## 2016-08-22 MED ORDER — OXYCODONE-ACETAMINOPHEN 5-325 MG PO TABS: 1.0000 | ORAL_TABLET | Freq: Four times a day (QID) | ORAL | 0 refills | Status: DC | PRN

## 2016-08-22 MED ORDER — HYDROMORPHONE HCL 1 MG/ML IJ SOLN
1.0000 mg | Freq: Once | INTRAMUSCULAR | Status: AC
  Administered 2016-08-22: 1 mg via INTRAVENOUS
  Filled 2016-08-22: qty 1

## 2016-08-22 NOTE — ED Triage Notes (Signed)
Pt arrives to via [facility] to triage with reports of left sided abdominal pain that she has experienced for greater than one week   Kidney CA dx 3 days ago  No pain relief with scripts

## 2016-08-22 NOTE — ED Provider Notes (Signed)
[facility] Emergency Department Provider Note        Time seen: ----------------------------------------- 9:01 AM on 08/22/2016 -----------------------------------------    I have reviewed the triage vital signs and the nursing notes.   HISTORY  Chief Complaint Abdominal Pain and Hip Pain    HPI Anita Small is a 66 y.o. female who arrives to the ER with complaints of left-sided abdominal pain. Patient states she's experienced worsening pain for the last week. She was diagnosed with [place] for renal cell carcinoma 3 days ago here at the hospital. Patient was not given pain medicine prescriptions according to her. She denies fevers or chills, she does have some left hip pain which is the side of her previous hip surgery. Nothing makes her symptoms better.   Past Medical History:  Diagnosis Date  . Adenomatous polyp 1999  . Anxiety   . Arthritis    Left knee  . CAD (coronary artery disease) of artery bypass graft    a. NSTEMI in 07/2015: Cath showed 99% stenosis of distal LCx  --> DES placed  . Depression    Paranoid tendencies  . Diabetes mellitus    Type II  . Diverticulosis   . GERD (gastroesophageal reflux disease)   . Hyperlipidemia   . Hypertension   . Hypothyroidism   . Obesity     Patient Active Problem List   Diagnosis Date Noted  . Cellulitis 04/08/2016  . NSTEMI (non-ST elevated myocardial infarction) ([place])   . Fall 07/29/2015  . Fracture of left humerus 07/28/2015  . Femur fracture, left ([place]) 07/28/2015  . Elevated troponin 07/28/2015  . Routine general medical examination at a health care facility 09/19/2010  . HLD (hyperlipidemia) 09/19/2010  . CAROTID ARTERY STENOSIS 07/25/2009  . Type 2 diabetes mellitus ([place]) 11/01/2006  . ANXIETY 11/01/2006  . Hypothyroidism 10/31/2006  . OBESITY 10/31/2006  . Episodic mood disorder ([place]) 10/31/2006  . Essential hypertension 10/31/2006  . GERD 10/31/2006  . Osteoarthritis,  multiple sites 10/31/2006    Past Surgical History:  Procedure Laterality Date  . ABDOMINAL HYSTERECTOMY  1977   One ovary left  . APPENDECTOMY    . BLADDER REPAIR  1977   Tacking  . CARDIAC CATHETERIZATION N/A 08/01/2015   Procedure: Left Heart Cath and Coronary Angiography;  Surgeon: Wellington Hampshire, MD;  Location: [facility];  Service: Cardiovascular;  Laterality: N/A;  . CARDIAC CATHETERIZATION N/A 08/01/2015   Procedure: Coronary Stent Intervention;  Surgeon: Wellington Hampshire, MD;  Location: [facility];  Service: Cardiovascular;  Laterality: N/A;  . CARDIOVASCULAR STRESS TEST  7/00   negative. No cardiolite  . CAROTID ENDARTERECTOMY  2/11   Dr Jamal Collin  . [place]  . CHOLECYSTECTOMY    . CT ABD W & PELVIS WO CM  2002   Negative. Pituitary normal on MRI  . HEEL SPUR SURGERY  03/03   Left  . INTRAMEDULLARY (IM) NAIL INTERTROCHANTERIC Left 08/04/2015   Procedure: INTRAMEDULLARY (IM) NAIL INTERTROCHANTRIC;  Surgeon: Hessie Knows, MD;  Location: [facility];  Service: Orthopedics;  Laterality: Left;  . KNEE ARTHROSCOPY  2001   Left   Tamala Julian)  . TEE WITHOUT CARDIOVERSION N/A 04/11/2016   Procedure: TRANSESOPHAGEAL ECHOCARDIOGRAM (TEE);  Surgeon: Teodoro Spray, MD;  Location: [facility];  Service: Cardiovascular;  Laterality: N/A;  . TOTAL KNEE ARTHROPLASTY  11/12   left--Dr Tamala Julian  . TRANSTHORACIC ECHOCARDIOGRAM  3/04   normal    Allergies Codeine  phosphate; Duloxetine; Meperidine hcl; Norco [hydrocodone-acetaminophen]; Venlafaxine; and Doxycycline  Social History Social History  Substance Use Topics  . Smoking status: Former Smoker    Types: Cigarettes  . Smokeless tobacco: Never Used  . Alcohol use Yes     Comment: rarely    Review of Systems Constitutional: Negative for fever. Cardiovascular: Negative for chest pain. Respiratory: Negative for shortness of breath. Gastrointestinal: Positive for left flank pain Genitourinary: Negative  for dysuria. Musculoskeletal: Positive for left hip pain Skin: Negative for rash. Neurological: Negative for headaches, focal weakness or numbness.  10-point ROS otherwise negative.  ____________________________________________   PHYSICAL EXAM:  VITAL SIGNS: ED Triage Vitals  Enc Vitals Group     BP 08/22/16 0801 (!) 155/83     Pulse Rate 08/22/16 0800 68     Resp 08/22/16 0800 20     Temp 08/22/16 0800 98.2 F (36.8 C)     Temp Source 08/22/16 0800 Oral     SpO2 08/22/16 0800 100 %     Weight 08/22/16 0800 212 lb (96.2 kg)     Height 08/22/16 0800 5\' 2"  (1.575 m)     Head Circumference --      Peak Flow --      Pain Score 08/22/16 0807 9     Pain Loc --      Pain Edu? --      Excl. in [place]? --     Constitutional: Alert and oriented. Well appearing and in no distress. Eyes: Conjunctivae are normal. PERRL. Normal extraocular movements. ENT   Head: Normocephalic and atraumatic.   Nose: No congestion/rhinnorhea.   Mouth/Throat: Mucous membranes are moist.   Neck: No stridor. Cardiovascular: Normal rate, regular rhythm. No murmurs, rubs, or gallops. Respiratory: Normal respiratory effort without tachypnea nor retractions. Breath sounds are clear and equal bilaterally. No wheezes/rales/rhonchi. Gastrointestinal: Left flank tenderness, no rebound or guarding. Normal bowel sounds. Musculoskeletal: Mild pain with range of motion of left hip Neurologic:  Normal speech and language. No gross focal neurologic deficits are appreciated.  Skin:  Skin is warm, dry and intact. No rash noted. Psychiatric: Mood and affect are normal. Speech and behavior are normal.  ___________________________________________  ED COURSE:  Pertinent labs & imaging results that were available during my care of the patient were reviewed by me and considered in my medical decision making (see chart for details). Patient arrives in no distress with flank pain after recent renal tumor diagnosis  in the ER. We will assess with labs and review recent imaging.   Procedures ____________________________________________   LABS (pertinent positives/negatives)  Labs Reviewed  COMPREHENSIVE METABOLIC PANEL - Abnormal; Notable for the following:       Result Value   Sodium 133 (*)    Chloride 98 (*)    Glucose, Bld 372 (*)    Albumin 3.4 (*)    ALT 11 (*)    GFR calc non Af Amer 59 (*)    All other components within normal limits  CBC - Abnormal; Notable for the following:    MCV 79.5 (*)    RDW 16.2 (*)    All other components within normal limits  URINALYSIS, COMPLETE (UACMP) WITH MICROSCOPIC - Abnormal; Notable for the following:    Color, Urine YELLOW (*)    APPearance CLEAR (*)    Glucose, UA >=500 (*)    Ketones, ur 5 (*)    Protein, ur 30 (*)    Squamous Epithelial / LPF 0-5 (*)  All other components within normal limits  LIPASE, BLOOD   ____________________________________________  FINAL ASSESSMENT AND PLAN  Flank pain, Hyperglycemia  Plan: Patient with labs as dictated above. I have reviewed her CT scan from 3 days ago and it is concerning for cancer. She has an approximately 6 x 6 cm mass involving the left kidney. Patient will be given pain medicine and is encouraged to continue outpatient follow-up with urology.   Earleen Newport, MD   Note: This note was generated in part or whole with voice recognition software. Voice recognition is usually quite accurate but there are transcription errors that can and very often do occur. I apologize for any typographical errors that were not detected and corrected.     Earleen Newport, MD 08/22/16 1020

## 2016-08-28 ENCOUNTER — Encounter: Payer: Self-pay | Admitting: Urology

## 2016-08-28 ENCOUNTER — Ambulatory Visit (INDEPENDENT_AMBULATORY_CARE_PROVIDER_SITE_OTHER): Payer: Medicare HMO | Admitting: Urology

## 2016-08-28 VITALS — BP 169/91 | HR 82 | Ht 61.0 in | Wt 210.0 lb

## 2016-08-28 DIAGNOSIS — N2889 Other specified disorders of kidney and ureter: Secondary | ICD-10-CM | POA: Diagnosis not present

## 2016-08-28 DIAGNOSIS — R109 Unspecified abdominal pain: Secondary | ICD-10-CM | POA: Diagnosis not present

## 2016-08-28 MED ORDER — KETOROLAC TROMETHAMINE 60 MG/2ML IM SOLN
30.0000 mg | Freq: Once | INTRAMUSCULAR | Status: AC
  Administered 2016-08-28: 30 mg via INTRAMUSCULAR

## 2016-08-28 MED ORDER — HYDROMORPHONE HCL 2 MG PO TABS: 2.0000 mg | ORAL_TABLET | ORAL | 0 refills | Status: DC | PRN

## 2016-08-28 NOTE — Progress Notes (Signed)
IM Injection  Patient is present today for an IM Injection for treatment of pain. Drug: Ketorolac Dose:30mg / 49ml Location:left upper outer buttocks Lot: [id number] Exp:12/2017 Patient tolerated well, no complications were noted  Preformed by: Fonnie Jarvis, CMA

## 2016-08-28 NOTE — Progress Notes (Signed)
08/28/2016 3:18 PM   Karna Christmas 1950/10/27 [id number]  Referring provider: Lenard Simmer, MD [address] [place], [place] [address]  Chief Complaint  Patient presents with  . New Patient (Initial Visit)    Renal Mass    HPI: 66 year old female with multiple medical comorbidities who presents today for further evaluation of a left-sided renal mass. She initially presented approximately 10 days ago with acute onset severe left flank pain. She was found to have a large 6 x 6.6 x 6.1 cm mid to left upper pole renal mass which appears to be directly invading the left adrenal gland measuring 3.1 x 3.9 cm.  There is no obvious renal vein invasion.  She has no obvious metastatic disease. No discrete enlarged lymph nodes are appreciated. LFTs, alkaline phosphatase within normal limits. Recent chest x-ray negative for any obvious pulmonary disease.  She denies any hematuria. She does have severe left flank pain. This is not well controlled with Percocet.  She has had recent involuntary weight loss.  She denies a family history of kidney cancer. She does have a remote personal history of ovarian cancer in the 80s not requiring radiation or chemotherapy.   She has lost a significant amount of weight over the past year without trying. She reports an approximately 40 pound weight loss over the past year.  [place] 2-3.  She does live by herself, dress and bathe herself but does not prepare meals, do any housework. She has trouble getting around. She is cared for by a neighbor who stops by frequently.  She does have a significant cardiac history status post percutaneous intervention following an STEMI in 07/2015. She is currently on aspirin and Plavix.  Previous abdominal surgeries include abdominal hysterectomy and laparoscopic cholecystectomy.  PMH: Past Medical History:  Diagnosis Date  . Adenomatous polyp 1999  . Anxiety   . Arthritis    Left knee  . CAD (coronary artery  disease) of artery bypass graft    a. NSTEMI in 07/2015: Cath showed 99% stenosis of distal LCx  --> DES placed  . Depression    Paranoid tendencies  . Diabetes mellitus    Type II  . Diverticulosis   . GERD (gastroesophageal reflux disease)   . Hyperlipidemia   . Hypertension   . Hypothyroidism   . Obesity     Surgical History: Past Surgical History:  Procedure Laterality Date  . ABDOMINAL HYSTERECTOMY  1977   One ovary left  . APPENDECTOMY    . BLADDER REPAIR  1977   Tacking  . CARDIAC CATHETERIZATION N/A 08/01/2015   Procedure: Left Heart Cath and Coronary Angiography;  Surgeon: Wellington Hampshire, MD;  Location: [facility];  Service: Cardiovascular;  Laterality: N/A;  . CARDIAC CATHETERIZATION N/A 08/01/2015   Procedure: Coronary Stent Intervention;  Surgeon: Wellington Hampshire, MD;  Location: [facility];  Service: Cardiovascular;  Laterality: N/A;  . CARDIOVASCULAR STRESS TEST  7/00   negative. No cardiolite  . CAROTID ENDARTERECTOMY  2/11   Dr Jamal Collin  . [place]  . CHOLECYSTECTOMY    . CT ABD W & PELVIS WO CM  2002   Negative. Pituitary normal on MRI  . HEEL SPUR SURGERY  03/03   Left  . INTRAMEDULLARY (IM) NAIL INTERTROCHANTERIC Left 08/04/2015   Procedure: INTRAMEDULLARY (IM) NAIL INTERTROCHANTRIC;  Surgeon: Hessie Knows, MD;  Location: [facility];  Service: Orthopedics;  Laterality: Left;  . KNEE ARTHROSCOPY  2001  Left   Tamala Julian)  . TEE WITHOUT CARDIOVERSION N/A 04/11/2016   Procedure: TRANSESOPHAGEAL ECHOCARDIOGRAM (TEE);  Surgeon: Teodoro Spray, MD;  Location: [facility];  Service: Cardiovascular;  Laterality: N/A;  . TOTAL KNEE ARTHROPLASTY  11/12   left--Dr Tamala Julian  . TRANSTHORACIC ECHOCARDIOGRAM  3/04   normal    Home Medications:  Allergies as of 08/28/2016      Reactions   Codeine Phosphate Other (See Comments)   REACTION: unspecified   Duloxetine Other (See Comments)   REACTION: unspecified   Meperidine Hcl Other (See  Comments)   REACTION: unspecified   Norco [hydrocodone-acetaminophen] Other (See Comments)   hallucinations   Venlafaxine Other (See Comments)   REACTION: unspecified   Doxycycline Rash      Medication List       Accurate as of 08/28/16  3:18 PM. Always use your most recent med list.          bisacodyl 5 MG EC tablet Commonly known as:  DULCOLAX Take 1 tablet (5 mg total) by mouth daily as needed for moderate constipation.   cetirizine 10 MG tablet Commonly known as:  ZYRTEC Take 10 mg by mouth at bedtime.   citalopram 40 MG tablet Commonly known as:  CELEXA Take 40 mg by mouth daily.   clopidogrel 75 MG tablet Commonly known as:  PLAVIX Take 1 tablet (75 mg total) by mouth daily with breakfast.   fenofibrate 145 MG tablet Commonly known as:  TRICOR Take 145 mg by mouth daily.   hydrochlorothiazide 25 MG tablet Commonly known as:  HYDRODIURIL Take 25 mg by mouth daily.   Insulin Glargine 300 UNIT/ML Sopn Commonly known as:  TOUJEO SOLOSTAR Inject 35 Units into the skin daily.   levothyroxine 100 MCG tablet Commonly known as:  SYNTHROID, LEVOTHROID Take 1 tablet (100 mcg total) by mouth daily.   lisinopril 20 MG tablet Commonly known as:  PRINIVIL,ZESTRIL Take 1 tablet (20 mg total) by mouth daily. Take everyday with HCTZ   metoprolol tartrate 25 MG tablet Commonly known as:  LOPRESSOR TAKE 1 TABLET TWICE DAILY   miconazole 200 MG vaginal suppository Commonly known as:  MICONAZOLE 3 Place 1 suppository (200 mg total) vaginally at bedtime.   oxyCODONE-acetaminophen 5-325 MG tablet Commonly known as:  PERCOCET Take 1-2 tablets by mouth every 6 (six) hours as needed for moderate pain or severe pain.   sitaGLIPtin-metformin 50-1000 MG tablet Commonly known as:  JANUMET Take 1 tablet by mouth 2 (two) times daily with a meal.   traZODone 100 MG tablet Commonly known as:  DESYREL Take 1 tablet (100 mg total) by mouth at bedtime.       Allergies:    Allergies  Allergen Reactions  . Codeine Phosphate Other (See Comments)    REACTION: unspecified  . Duloxetine Other (See Comments)    REACTION: unspecified  . Meperidine Hcl Other (See Comments)    REACTION: unspecified  . Norco [Hydrocodone-Acetaminophen] Other (See Comments)    hallucinations  . Venlafaxine Other (See Comments)    REACTION: unspecified  . Doxycycline Rash    Family History: Family History  Problem Relation Age of Onset  . Cancer Mother     Breast  . Heart disease Mother   . Diabetes Mother   . Colon polyps Mother   . Heart disease Father   . Diabetes Father   . Hypertension Sister   . Diabetes Sister   . Depression Daughter   . Cancer Paternal Grandmother     ?  throat CA  . Colon cancer      Unsure if mother had it   . Bladder Cancer Neg Hx   . Kidney cancer Neg Hx     Social History:  reports that she has quit smoking. Her smoking use included Cigarettes. She has never used smokeless tobacco. She reports that she drinks alcohol. She reports that she does not use drugs.  ROS: UROLOGY Frequent Urination?: Yes Hard to postpone urination?: Yes Burning/pain with urination?: No Get up at night to urinate?: Yes Leakage of urine?: No Urine stream starts and stops?: No Trouble starting stream?: No Do you have to strain to urinate?: No Blood in urine?: No Urinary tract infection?: No Sexually transmitted disease?: No Injury to kidneys or bladder?: No Painful intercourse?: No Weak stream?: No Currently pregnant?: No Vaginal bleeding?: No Last menstrual period?: n  Gastrointestinal Nausea?: Yes Vomiting?: No Indigestion/heartburn?: No Diarrhea?: No Constipation?: No  Constitutional Fever: No Night sweats?: No Weight loss?: No Fatigue?: Yes  Skin Skin rash/lesions?: No Itching?: Yes  Eyes Blurred vision?: No Double vision?: No  Ears/Nose/Throat Sore throat?: No Sinus problems?: No  Hematologic/Lymphatic Swollen glands?:  No Easy bruising?: No  Cardiovascular Leg swelling?: No Chest pain?: No  Respiratory Cough?: No Shortness of breath?: No  Endocrine Excessive thirst?: Yes  Musculoskeletal Back pain?: Yes Joint pain?: No  Neurological Headaches?: No Dizziness?: No  Psychologic Depression?: Yes Anxiety?: Yes  Physical Exam: BP (!) 169/91   Pulse 82   Ht 5' 1"  (1.549 m)   Wt 210 lb (95.3 kg)   BMI 39.68 kg/m   Constitutional:  Alert and oriented, appears to be mildly in distress secondary to left flank pain.  Accompanied by neighbor. HEENT: [place] AT, moist mucus membranes.  Trachea midline, no masses. Cardiovascular: No clubbing, cyanosis.  Mild bilateral lower extremity edema. Respiratory: Normal respiratory effort, no increased work of breathing. GI: Abdomen is soft, nontender, nondistended, no abdominal masses.  Morbidly obese abdomen with large pannus. Previous laparoscopic cholecystectomy scar is appreciated as well as a lower abdominal Pfannenstiel incision. GU: No CVA tenderness.  Skin: No rashes, bruises or suspicious lesions. Lymph: No cervical adenopathy. Neurologic: Grossly intact, no focal deficits, moving all 4 extremities.  Ambulating with cane. Psychiatric: Normal mood and affect.  Laboratory Data: Lab Results  Component Value Date   WBC 7.3 08/22/2016   HGB 12.6 08/22/2016   HCT 38.2 08/22/2016   MCV 79.5 (L) 08/22/2016   PLT 305 08/22/2016    Lab Results  Component Value Date   CREATININE 0.97 08/22/2016    Lab Results  Component Value Date   HGBA1C 6.7 (H) 08/13/2015   Hepatic Function Latest Ref Rng & Units 08/22/2016 08/19/2016 04/08/2016  Total Protein 6.5 - 8.1 g/dL 7.3 7.7 6.9  Albumin 3.5 - 5.0 g/dL 3.4(L) 3.5 3.5  AST 15 - 41 U/L 16 20 19   ALT 14 - 54 U/L 11(L) 13(L) 16  Alk Phosphatase 38 - 126 U/L 98 126 74  Total Bilirubin 0.3 - 1.2 mg/dL 0.5 0.5 0.1(L)  Bilirubin, Direct 0.01 - 0.4 mg/dL - - -    Urinalysis    Component Value Date/Time    COLORURINE YELLOW (A) 08/22/2016 0834   APPEARANCEUR CLEAR (A) 08/22/2016 0834   APPEARANCEUR Clear 06/11/2014 1845   LABSPEC 1.023 08/22/2016 0834   LABSPEC 1.038 06/11/2014 1845   PHURINE 7.0 08/22/2016 0834   GLUCOSEU >=500 (A) 08/22/2016 0834   GLUCOSEU Negative 06/11/2014 1845   HGBUR NEGATIVE 08/22/2016 0834  HGBUR negative 06/01/2009 1556   BILIRUBINUR NEGATIVE 08/22/2016 0834   BILIRUBINUR Negative 06/11/2014 1845   KETONESUR 5 (A) 08/22/2016 0834   PROTEINUR 30 (A) 08/22/2016 0834   UROBILINOGEN 0.2 12/08/2010 1543   UROBILINOGEN 0.2 06/01/2009 1556   NITRITE NEGATIVE 08/22/2016 0834   LEUKOCYTESUR NEGATIVE 08/22/2016 0834   LEUKOCYTESUR Negative 06/11/2014 1845    Pertinent Imaging: CLINICAL DATA:  Pt c/o generalized abd pain with nausea for the past week and increased pain in the left hip with no injury.Hx of chole, appy and hyster  EXAM: CT ABDOMEN AND PELVIS WITH CONTRAST  TECHNIQUE: Multidetector CT imaging of the abdomen and pelvis was performed using the standard protocol following bolus administration of intravenous contrast.  CONTRAST:  146m ISOVUE-300 IOPAMIDOL (ISOVUE-300) INJECTION 61%  COMPARISON:  None.  FINDINGS: Lower chest: No acute abnormality.  Hepatobiliary: There is mild central volume loss and subtle surface nodularity along the left liver lobe. These findings raise the possibility of cirrhosis. There is no liver mass or focal lesion. Gallbladder surgically absent. No bile duct dilation.  Pancreas: Unremarkable. No pancreatic ductal dilatation or surrounding inflammatory changes.  Spleen: Choose 1  Adrenals/Urinary Tract: There is an ill-defined mass arising from the mid to upper pole the left kidney measuring 6.0 x 6.6 x 6.1 cm. Mass is contiguous with an enlarged heterogeneous adrenal gland which measures approximately 3.1 x 3.9 cm. There is mild dilation of the lower pole renal calices. Overall, there is less  enhancement of the left kidney with delayed excretion. There is delayed opacification of the left renal vein but no convincing renal vein thrombus or tumor. A small stone lies in the lower pole the left kidney.  No right renal masses, stones or hydronephrosis.  Both ureters are normal course and in caliber. Bladder is unremarkable.  Stomach/Bowel: Stomach and small bowel are unremarkable. There are numerous colonic diverticula. No diverticulitis. No other colon abnormality. Normal appendix not discretely seen.  Vascular/Lymphatic: No discrete enlarged retroperitoneal or intraperitoneal lymph nodes. Atherosclerotic calcifications are noted along a normal caliber abdominal aorta.  Reproductive: Status post hysterectomy. No adnexal masses.  Other: Small fat containing umbilical hernia.  No ascites.  Musculoskeletal: Status post ORIF of a proximal left femur fracture. The visualized orthopedic hardware is well seated. Mild compression fracture of L1, which appears old. No osteoblastic or osteolytic lesions.  IMPRESSION: 1. 6.6 cm, heterogeneous left mid to upper pole renal mass highly suspicious for a renal cell carcinoma. This involves the left renal hilar structures, leading to a relative decrease in left renal enhancement as well as delayed excretion. The renal mass appears to invade and enlarged the left adrenal gland. There is no convincing extension of the mass beyond Gerota's fascia. No convincing renal vein tumor and no discrete enlarged retroperitoneal or intraperitoneal lymph nodes. 2. No acute abnormalities within the abdomen or pelvis. 3. There morphologic changes of the liver raising the possibility of cirrhosis. No liver mass or focal lesion. 4. Status post hysterectomy and cholecystectomy. 5. Colonic diverticulosis without evidence of diverticulitis. 6. Aortic atherosclerosis.   Electronically Signed   By: DLajean ManesM.D.   On: 08/19/2016  14:22  CT abdomen and pelvis personally reviewed today with the patient.  Assessment & Plan:  66year old female with a large invasive appearing T4 left RCC not involving renal vein. No evidence of metastatic disease.    1. Renal mass CT images were reviewed with the patient. I have recommended radical nephrectomy including the adrenal gland. We'll plan  to perform this procedure as a hand-assisted laparoscopic procedure with possible conversion to open if needed. Risk and benefits of the procedure were explained in detail. Specifically, risks including conversion to open, severe bleeding, need for blood transfusion, damage to surrounding structures including bowel, spleen, major vessels, infection,  renal compromise with progression to end-stage renal disease, heart attack, stroke,PE/ DVT,  and death were all described in detail.  Overall, she has multiple medical comorbidities including significant coronary artery disease. She will need cardiac clearance to proceed with surgery and aspirin and Plavix will need to be held for this procedure. She understands by holding these medications, she is at higher risk for stroke or heart attack or even death.   She is also aware that despite surgical intervention, she is at risk for development of metastatic disease and will be monitored postoperatively for this.  The postoperative course was discussed in detail. Given her medical comorbidities and ECOG status, she will likely be discharged to rehabilitation following the procedure.  All her questions were answered today in detail. She is anxious to proceed with surgery given her severe pain.   We'll plan to repeat all preop labs. We'll plan to give her clindamycin given her history of a recent MRSA infection.  2. Flank pain Likely secondary to large invasive left renal mass.  She was given additional prescription today for narcotics in the form of dilaudid.  She states that she will not be able to fill  this prescription until tomorrow due to finances. She was given a dose of Toradol today in the office to help with her severe flank pain.  - ketorolac (TORADOL) injection 30 mg; Inject 1 mL (30 mg total) into the muscle once.  Hollice Espy, MD  [facility] [address], [place], [place] [address] [phone]  I spent 45 min with this patient of which greater than 50% was spent in counseling and coordination of care with the patient.

## 2016-08-29 ENCOUNTER — Other Ambulatory Visit: Payer: Self-pay | Admitting: Radiology

## 2016-08-29 DIAGNOSIS — N2889 Other specified disorders of kidney and ureter: Secondary | ICD-10-CM

## 2016-08-30 ENCOUNTER — Emergency Department
Admission: EM | Admit: 2016-08-30 | Discharge: 2016-08-30 | Disposition: A | Discharge status: Home / Self Care | Payer: Medicare HMO | Attending: Emergency Medicine | Admitting: Emergency Medicine

## 2016-08-30 DIAGNOSIS — Z87891 Personal history of nicotine dependence: Secondary | ICD-10-CM | POA: Diagnosis not present

## 2016-08-30 DIAGNOSIS — E119 Type 2 diabetes mellitus without complications: Secondary | ICD-10-CM | POA: Diagnosis not present

## 2016-08-30 DIAGNOSIS — C649 Malignant neoplasm of unspecified kidney, except renal pelvis: Secondary | ICD-10-CM | POA: Diagnosis not present

## 2016-08-30 DIAGNOSIS — E039 Hypothyroidism, unspecified: Secondary | ICD-10-CM | POA: Insufficient documentation

## 2016-08-30 DIAGNOSIS — R109 Unspecified abdominal pain: Secondary | ICD-10-CM | POA: Diagnosis present

## 2016-08-30 DIAGNOSIS — Z794 Long term (current) use of insulin: Secondary | ICD-10-CM | POA: Diagnosis not present

## 2016-08-30 DIAGNOSIS — R1012 Left upper quadrant pain: Secondary | ICD-10-CM | POA: Insufficient documentation

## 2016-08-30 DIAGNOSIS — Z79899 Other long term (current) drug therapy: Secondary | ICD-10-CM | POA: Insufficient documentation

## 2016-08-30 DIAGNOSIS — I10 Essential (primary) hypertension: Secondary | ICD-10-CM | POA: Diagnosis not present

## 2016-08-30 DIAGNOSIS — I25810 Atherosclerosis of coronary artery bypass graft(s) without angina pectoris: Secondary | ICD-10-CM | POA: Diagnosis not present

## 2016-08-30 DIAGNOSIS — C642 Malignant neoplasm of left kidney, except renal pelvis: Secondary | ICD-10-CM

## 2016-08-30 LAB — COMPREHENSIVE METABOLIC PANEL
ALT: 12 U/L — ABNORMAL LOW (ref 14–54)
ANION GAP: 13 (ref 5–15)
AST: 15 U/L (ref 15–41)
Albumin: 3.8 g/dL (ref 3.5–5.0)
Alkaline Phosphatase: 97 U/L (ref 38–126)
BUN: 14 mg/dL (ref 6–20)
CHLORIDE: 100 mmol/L — AB (ref 101–111)
CO2: 21 mmol/L — ABNORMAL LOW (ref 22–32)
Calcium: 8.6 mg/dL — ABNORMAL LOW (ref 8.9–10.3)
Creatinine, Ser: 1.08 mg/dL — ABNORMAL HIGH (ref 0.44–1.00)
GFR, EST NON AFRICAN AMERICAN: 52 mL/min — AB (ref >60)
Glucose, Bld: 376 mg/dL — ABNORMAL HIGH (ref 65–99)
POTASSIUM: 3.8 mmol/L (ref 3.5–5.1)
Sodium: 134 mmol/L — ABNORMAL LOW (ref 135–145)
Total Bilirubin: 0.4 mg/dL (ref 0.3–1.2)
Total Protein: 8 g/dL (ref 6.5–8.1)

## 2016-08-30 LAB — CBC WITH DIFFERENTIAL/PLATELET
BASOS ABS: 0.1 10*3/uL (ref 0–0.1)
Basophils Relative: 1 %
EOS PCT: 1 %
Eosinophils Absolute: 0.1 10*3/uL (ref 0–0.7)
HCT: 41.9 % (ref 35.0–47.0)
Hemoglobin: 13.5 g/dL (ref 12.0–16.0)
Lymphocytes Relative: 15 %
Lymphs Abs: 1.2 10*3/uL (ref 1.0–3.6)
MCH: 26.2 pg (ref 26.0–34.0)
MCHC: 32.2 g/dL (ref 32.0–36.0)
MCV: 81.4 fL (ref 80.0–100.0)
MONO ABS: 0.4 10*3/uL (ref 0.2–0.9)
Monocytes Relative: 5 %
Neutro Abs: 6.2 10*3/uL (ref 1.4–6.5)
Neutrophils Relative %: 78 %
PLATELETS: 284 10*3/uL (ref 150–440)
RBC: 5.15 MIL/uL (ref 3.80–5.20)
RDW: 16.3 % — AB (ref 11.5–14.5)
WBC: 7.9 10*3/uL (ref 3.6–11.0)

## 2016-08-30 LAB — URINALYSIS, COMPLETE (UACMP) WITH MICROSCOPIC
BILIRUBIN URINE: NEGATIVE
Bacteria, UA: NONE SEEN
HGB URINE DIPSTICK: NEGATIVE
KETONES UR: 20 mg/dL — AB
LEUKOCYTES UA: NEGATIVE
NITRITE: NEGATIVE
PH: 6 (ref 5.0–8.0)
PROTEIN: 100 mg/dL — AB
RBC / HPF: NONE SEEN RBC/hpf (ref 0–5)
Specific Gravity, Urine: 1.016 (ref 1.005–1.030)

## 2016-08-30 LAB — LIPASE, BLOOD: LIPASE: 16 U/L (ref 11–51)

## 2016-08-30 MED ORDER — ONDANSETRON HCL 4 MG/2ML IJ SOLN
4.0000 mg | Freq: Once | INTRAMUSCULAR | Status: AC
  Administered 2016-08-30: 4 mg via INTRAVENOUS

## 2016-08-30 MED ORDER — ONDANSETRON HCL 4 MG/2ML IJ SOLN
INTRAMUSCULAR | Status: AC
  Filled 2016-08-30: qty 2

## 2016-08-30 MED ORDER — ONDANSETRON HCL 4 MG PO TABS: 4.0000 mg | ORAL_TABLET | Freq: Every day | ORAL | 1 refills | Status: DC | PRN

## 2016-08-30 MED ORDER — MORPHINE SULFATE (PF) 4 MG/ML IV SOLN
INTRAVENOUS | Status: AC
  Filled 2016-08-30: qty 1

## 2016-08-30 MED ORDER — ONDANSETRON HCL 4 MG/2ML IJ SOLN
4.0000 mg | Freq: Once | INTRAMUSCULAR | Status: AC
  Administered 2016-08-30: 4 mg via INTRAVENOUS
  Filled 2016-08-30: qty 2

## 2016-08-30 MED ORDER — HYDROMORPHONE HCL 2 MG PO TABS: 4.0000 mg | ORAL_TABLET | Freq: Two times a day (BID) | ORAL | 0 refills | Status: DC | PRN

## 2016-08-30 MED ORDER — MORPHINE SULFATE (PF) 4 MG/ML IV SOLN
4.0000 mg | Freq: Once | INTRAVENOUS | Status: AC
  Administered 2016-08-30: 4 mg via INTRAVENOUS

## 2016-08-30 MED ORDER — MORPHINE SULFATE (PF) 10 MG/ML IV SOLN
10.0000 mg | Freq: Once | INTRAVENOUS | Status: AC
  Administered 2016-08-30: 10 mg via INTRAVENOUS
  Filled 2016-08-30: qty 1

## 2016-08-30 MED ORDER — SODIUM CHLORIDE 0.9 % IV BOLUS (SEPSIS)
500.0000 mL | Freq: Once | INTRAVENOUS | Status: AC
  Administered 2016-08-30: 500 mL via INTRAVENOUS

## 2016-08-30 MED ORDER — HYDROMORPHONE HCL 2 MG PO TABS
4.0000 mg | ORAL_TABLET | Freq: Once | ORAL | Status: AC
  Administered 2016-08-30: 4 mg via ORAL
  Filled 2016-08-30: qty 2

## 2016-08-30 NOTE — ED Notes (Signed)
Pt given meal tray. NAD. Friend remains at bedside. No needs

## 2016-08-30 NOTE — ED Notes (Signed)
Lights off for comfort. Friend remains at bedside.

## 2016-08-30 NOTE — ED Notes (Signed)
Pt reports ok to take morphine. Only has itching with codeine and demerol

## 2016-08-30 NOTE — ED Notes (Signed)
Patient states she is hungry.  instr not to eat anything yet.  Has been drinking ginger ale.up to bathroom to void.

## 2016-08-30 NOTE — ED Provider Notes (Signed)
[facility] Emergency Department Provider Note  ____________________________________________   First MD Initiated Contact with Patient 08/30/16 [phone]     (approximate)  I have reviewed the triage vital signs and the nursing notes.   HISTORY  Chief Complaint Abdominal Pain    HPI Anita Small is a 66 y.o. female who comes to the emergency department with exacerbation of her left sided abdominal pain. 2 weeks ago she was diagnosed with likely renal cell carcinoma with a CT scan showing a 6 cm mass to the superior pole of her left kidney. She has been discharged home with hydrocodone and urology follow-up. Biopsy is pending clearance from her cardiologist that she has a history of myocardial infarction. Her pain was initially controlled but for the past several days has become moderate to severe aching constant left-sided pain. Nothing makes it better or worse. She has decreased appetite. She's had nausea but not vomited. No dysuria. No fevers or chills. She last had a bowel movement yesterday and last passed flatus this morning.   Past Medical History:  Diagnosis Date  . Adenomatous polyp 1999  . Anxiety   . Arthritis    Left knee  . CAD (coronary artery disease) of artery bypass graft    a. NSTEMI in 07/2015: Cath showed 99% stenosis of distal LCx  --> DES placed  . Depression    Paranoid tendencies  . Diabetes mellitus    Type II  . Diverticulosis   . GERD (gastroesophageal reflux disease)   . Hyperlipidemia   . Hypertension   . Hypothyroidism   . Obesity     Patient Active Problem List   Diagnosis Date Noted  . Cellulitis 04/08/2016  . NSTEMI (non-ST elevated myocardial infarction) ([place])   . Fall 07/29/2015  . Fracture of left humerus 07/28/2015  . Femur fracture, left ([place]) 07/28/2015  . Elevated troponin 07/28/2015  . Routine general medical examination at a health care facility 09/19/2010  . HLD (hyperlipidemia) 09/19/2010  .  CAROTID ARTERY STENOSIS 07/25/2009  . Type 2 diabetes mellitus ([place]) 11/01/2006  . ANXIETY 11/01/2006  . Hypothyroidism 10/31/2006  . OBESITY 10/31/2006  . Episodic mood disorder ([place]) 10/31/2006  . Essential hypertension 10/31/2006  . GERD 10/31/2006  . Osteoarthritis, multiple sites 10/31/2006    Past Surgical History:  Procedure Laterality Date  . ABDOMINAL HYSTERECTOMY  1977   One ovary left  . APPENDECTOMY    . BLADDER REPAIR  1977   Tacking  . CARDIAC CATHETERIZATION N/A 08/01/2015   Procedure: Left Heart Cath and Coronary Angiography;  Surgeon: Wellington Hampshire, MD;  Location: [facility] [place] [facility];  Service: Cardiovascular;  Laterality: N/A;  . CARDIAC CATHETERIZATION N/A 08/01/2015   Procedure: Coronary Stent Intervention;  Surgeon: Wellington Hampshire, MD;  Location: [facility];  Service: Cardiovascular;  Laterality: N/A;  . CARDIOVASCULAR STRESS TEST  7/00   negative. No cardiolite  . CAROTID ENDARTERECTOMY  2/11   Dr Jamal Collin  . [place]  . CHOLECYSTECTOMY    . CT ABD W & PELVIS WO CM  2002   Negative. Pituitary normal on MRI  . HEEL SPUR SURGERY  03/03   Left  . INTRAMEDULLARY (IM) NAIL INTERTROCHANTERIC Left 08/04/2015   Procedure: INTRAMEDULLARY (IM) NAIL INTERTROCHANTRIC;  Surgeon: Hessie Knows, MD;  Location: [facility];  Service: Orthopedics;  Laterality: Left;  . KNEE ARTHROSCOPY  2001   Left   Tamala Julian)  . TEE WITHOUT CARDIOVERSION N/A 04/11/2016  Procedure: TRANSESOPHAGEAL ECHOCARDIOGRAM (TEE);  Surgeon: Teodoro Spray, MD;  Location: [facility];  Service: Cardiovascular;  Laterality: N/A;  . TOTAL KNEE ARTHROPLASTY  11/12   left--Dr Tamala Julian  . TRANSTHORACIC ECHOCARDIOGRAM  3/04   normal    Prior to Admission medications   Medication Sig Start Date End Date Taking? Authorizing Provider  citalopram (CELEXA) 40 MG tablet Take 40 mg by mouth daily.   Yes Historical Provider, MD  hydrochlorothiazide (HYDRODIURIL) 25 MG tablet Take 25 mg by  mouth daily.   Yes Historical Provider, MD  Insulin Glargine (TOUJEO SOLOSTAR) 300 UNIT/ML SOPN Inject 35 Units into the skin daily. 08/19/16  Yes Delman Kitten, MD  lisinopril (PRINIVIL,ZESTRIL) 20 MG tablet Take 1 tablet (20 mg total) by mouth daily. Take everyday with HCTZ 02/25/14  Yes Venia Carbon, MD  metoprolol tartrate (LOPRESSOR) 25 MG tablet TAKE 1 TABLET TWICE DAILY 12/05/15  Yes Wellington Hampshire, MD  traZODone (DESYREL) 100 MG tablet Take 1 tablet (100 mg total) by mouth at bedtime. 08/08/15  Yes Vaughan Basta, MD  bisacodyl (DULCOLAX) 5 MG EC tablet Take 1 tablet (5 mg total) by mouth daily as needed for moderate constipation. Patient not taking: Reported on 08/30/2016 08/08/15   Vaughan Basta, MD  cetirizine (ZYRTEC) 10 MG tablet Take 10 mg by mouth at bedtime.    Historical Provider, MD  clopidogrel (PLAVIX) 75 MG tablet Take 1 tablet (75 mg total) by mouth daily with breakfast. Patient not taking: Reported on 08/30/2016 08/08/15   Vaughan Basta, MD  fenofibrate (TRICOR) 145 MG tablet Take 145 mg by mouth daily.    Historical Provider, MD  HYDROmorphone (DILAUDID) 2 MG tablet Take 2 tablets (4 mg total) by mouth every 12 (twelve) hours as needed for severe pain. 08/30/16 08/30/17  Darel Hong, MD  levothyroxine (SYNTHROID, LEVOTHROID) 100 MCG tablet Take 1 tablet (100 mcg total) by mouth daily. Patient not taking: Reported on 08/30/2016 02/25/14   Venia Carbon, MD  miconazole (MICONAZOLE 3) 200 MG vaginal suppository Place 1 suppository (200 mg total) vaginally at bedtime. 08/19/16   Delman Kitten, MD  ondansetron (ZOFRAN) 4 MG tablet Take 1 tablet (4 mg total) by mouth daily as needed for nausea or vomiting. 08/30/16 08/30/17  Darel Hong, MD  oxyCODONE-acetaminophen (PERCOCET) 5-325 MG tablet Take 1-2 tablets by mouth every 6 (six) hours as needed for moderate pain or severe pain. Patient not taking: Reported on 08/30/2016 08/22/16   Earleen Newport, MD    sitaGLIPtin-metformin (JANUMET) 50-1000 MG tablet Take 1 tablet by mouth 2 (two) times daily with a meal. Patient not taking: Reported on 08/30/2016 08/19/16   Delman Kitten, MD    Allergies Codeine phosphate; Duloxetine; Meperidine hcl; Norco [hydrocodone-acetaminophen]; Venlafaxine; and Doxycycline  Family History  Problem Relation Age of Onset  . Cancer Mother     Breast  . Heart disease Mother   . Diabetes Mother   . Colon polyps Mother   . Heart disease Father   . Diabetes Father   . Hypertension Sister   . Diabetes Sister   . Depression Daughter   . Cancer Paternal Grandmother     ? throat CA  . Colon cancer      Unsure if mother had it   . Bladder Cancer Neg Hx   . Kidney cancer Neg Hx     Social History Social History  Substance Use Topics  . Smoking status: Former Smoker    Types: Cigarettes  . Smokeless tobacco:  Never Used  . Alcohol use Yes     Comment: rarely    Review of Systems Constitutional: No fever/chills Eyes: No visual changes. ENT: No sore throat. Cardiovascular: Denies chest pain. Respiratory: Denies shortness of breath. Gastrointestinal: Positive abdominal pain.  Positive nausea, no vomiting.  No diarrhea.  No constipation. Genitourinary: Negative for dysuria. Musculoskeletal: Negative for back pain. Skin: Negative for rash. Neurological: Negative for headaches, focal weakness or numbness.  10-point ROS otherwise negative.  ____________________________________________   PHYSICAL EXAM:  VITAL SIGNS: ED Triage Vitals  Enc Vitals Group     BP 08/30/16 0952 (!) 192/95     Pulse Rate 08/30/16 0952 92     Resp 08/30/16 0952 20     Temp 08/30/16 0952 99.2 F (37.3 C)     Temp Source 08/30/16 0952 Oral     SpO2 08/30/16 0952 99 %     Weight 08/30/16 0952 210 lb (95.3 kg)     Height 08/30/16 0952 5\' 1"  (1.549 m)     Head Circumference --      Peak Flow --      Pain Score 08/30/16 0953 10     Pain Loc --      Pain Edu? --      Excl. in  [place]? --     Constitutional: Alert and oriented x 4 Curled on her side moaning in pain appears uncomfortable Eyes: PERRL EOMI. Head: Atraumatic. Nose: No congestion/rhinnorhea. Mouth/Throat: No trismus Neck: No stridor.   Cardiovascular: Normal rate, regular rhythm. Grossly normal heart sounds.  Good peripheral circulation. Respiratory: Normal respiratory effort.  No retractions. Lungs CTAB and moving good air Gastrointestinal: Soft nondistended mild left-sided tenderness with no rebound no guarding no peritonitis no McBurney's tenderness negative Rovsing's no costovertebral tenderness negative Murphy's Musculoskeletal: No lower extremity edema   Neurologic:  Normal speech and language. No gross focal neurologic deficits are appreciated. Skin:  Skin is warm, dry and intact. No rash noted. Psychiatric: Mood and affect are normal. Speech and behavior are normal.  ____________________________________________   LABS (all labs ordered are listed, but only abnormal results are displayed)  Labs Reviewed  CBC WITH DIFFERENTIAL/PLATELET - Abnormal; Notable for the following:       Result Value   RDW 16.3 (*)    All other components within normal limits  COMPREHENSIVE METABOLIC PANEL - Abnormal; Notable for the following:    Sodium 134 (*)    Chloride 100 (*)    CO2 21 (*)    Glucose, Bld 376 (*)    Creatinine, Ser 1.08 (*)    Calcium 8.6 (*)    ALT 12 (*)    GFR calc non Af Amer 52 (*)    All other components within normal limits  URINALYSIS, COMPLETE (UACMP) WITH MICROSCOPIC - Abnormal; Notable for the following:    Color, Urine STRAW (*)    APPearance CLEAR (*)    Glucose, UA >=500 (*)    Ketones, ur 20 (*)    Protein, ur 100 (*)    Squamous Epithelial / LPF 0-5 (*)    All other components within normal limits  LIPASE, BLOOD    ____________________________________________  EKG   ____________________________________________  RADIOLOGY   ____________________________________________   PROCEDURES  Procedure(s) performed: no  Procedures  Critical Care performed: no  ____________________________________________   INITIAL IMPRESSION / ASSESSMENT AND PLAN / ED COURSE  Pertinent labs & imaging results that were available during my care of the patient were reviewed by me  and considered in my medical decision making (see chart for details).  By the time I saw the patient she guarded he received 4 mg of morphine but was in a significant amount of pain. Doubt obstruction. Not frankly peritoneal today. I will check labs given her decreased oral intake to see if there are electrolyte disturbances. 10 mg of morphine now.  ----------------------------------------- 1:02 PM on 08/30/2016 -----------------------------------------  After 10 mg more of morphine the patient is still in a significant amount of pain. I will increase her to 4 mg of oral hydromorphone and if this does not control her pain she will require inpatient admission for pain control.     ----------------------------------------- 2:31 PM on 08/30/2016 -----------------------------------------  The patient's pain is now adequately controlled. She is stable for outpatient management.   ____________________________________________   FINAL CLINICAL IMPRESSION(S) / ED DIAGNOSES  Final diagnoses:  Left upper quadrant pain  Malignant neoplasm of left kidney (HCC)      NEW MEDICATIONS STARTED DURING THIS VISIT:  Discharge Medication List as of 08/30/2016  2:33 PM    START taking these medications   Details  ondansetron (ZOFRAN) 4 MG tablet Take 1 tablet (4 mg total) by mouth daily as needed for nausea or vomiting., Starting Thu 08/30/2016, Until Fri 08/30/2017, Print         Note:  This document was prepared using Dragon voice  recognition software and may include unintentional dictation errors.     Darel Hong, MD 08/30/16 [phone]

## 2016-08-30 NOTE — ED Triage Notes (Signed)
Pt c/o left sided abd pain with nausea for the past 2 weeks.. States she was seen here last week and they suspect kidney CA.Anita Small

## 2016-08-30 NOTE — Discharge Instructions (Signed)
Please follow-up with your primary care physician within one week for a recheck. Keep your appointment with her cardiologist and your urologist as scheduled. Return to the emergency department sooner for any new or worsening symptoms such as worsening pain, if you cannot eat or drink, or for any other concerns.  Results for orders placed or performed during the hospital encounter of 08/30/16  CBC with Differential  Result Value Ref Range   WBC 7.9 3.6 - 11.0 K/uL   RBC 5.15 3.80 - 5.20 MIL/uL   Hemoglobin 13.5 12.0 - 16.0 g/dL   HCT 41.9 35.0 - 47.0 %   MCV 81.4 80.0 - 100.0 fL   MCH 26.2 26.0 - 34.0 pg   MCHC 32.2 32.0 - 36.0 g/dL   RDW 16.3 (H) 11.5 - 14.5 %   Platelets 284 150 - 440 K/uL   Neutrophils Relative % 78 %   Neutro Abs 6.2 1.4 - 6.5 K/uL   Lymphocytes Relative 15 %   Lymphs Abs 1.2 1.0 - 3.6 K/uL   Monocytes Relative 5 %   Monocytes Absolute 0.4 0.2 - 0.9 K/uL   Eosinophils Relative 1 %   Eosinophils Absolute 0.1 0 - 0.7 K/uL   Basophils Relative 1 %   Basophils Absolute 0.1 0 - 0.1 K/uL  Comprehensive metabolic panel  Result Value Ref Range   Sodium 134 (L) 135 - 145 mmol/L   Potassium 3.8 3.5 - 5.1 mmol/L   Chloride 100 (L) 101 - 111 mmol/L   CO2 21 (L) 22 - 32 mmol/L   Glucose, Bld 376 (H) 65 - 99 mg/dL   BUN 14 6 - 20 mg/dL   Creatinine, Ser 1.08 (H) 0.44 - 1.00 mg/dL   Calcium 8.6 (L) 8.9 - 10.3 mg/dL   Total Protein 8.0 6.5 - 8.1 g/dL   Albumin 3.8 3.5 - 5.0 g/dL   AST 15 15 - 41 U/L   ALT 12 (L) 14 - 54 U/L   Alkaline Phosphatase 97 38 - 126 U/L   Total Bilirubin 0.4 0.3 - 1.2 mg/dL   GFR calc non Af Amer 52 (L) >60 mL/min   GFR calc Af Amer >60 >60 mL/min   Anion gap 13 5 - 15  Lipase, blood  Result Value Ref Range   Lipase 16 11 - 51 U/L  Urinalysis, Complete w Microscopic  Result Value Ref Range   Color, Urine STRAW (A) YELLOW   APPearance CLEAR (A) CLEAR   Specific Gravity, Urine 1.016 1.005 - 1.030   pH 6.0 5.0 - 8.0   Glucose, UA >=500  (A) NEGATIVE mg/dL   Hgb urine dipstick NEGATIVE NEGATIVE   Bilirubin Urine NEGATIVE NEGATIVE   Ketones, ur 20 (A) NEGATIVE mg/dL   Protein, ur 100 (A) NEGATIVE mg/dL   Nitrite NEGATIVE NEGATIVE   Leukocytes, UA NEGATIVE NEGATIVE   RBC / HPF NONE SEEN 0 - 5 RBC/hpf   WBC, UA 0-5 0 - 5 WBC/hpf   Bacteria, UA NONE SEEN NONE SEEN   Squamous Epithelial / LPF 0-5 (A) NONE SEEN   Dg Chest 2 View  Result Date: 08/19/2016 CLINICAL DATA:  Pt states recent onset left flank pain, left lower ribs pain extends inferiorly. Hx/o CAD and HTN. Former smoker. EXAM: CHEST  2 VIEW COMPARISON:  04/11/2016 FINDINGS: Cardiac silhouette is normal in size and configuration. Normal mediastinal and hilar contours. Clear lungs.  No pleural effusion or pneumothorax. Chronic resorption of the left humeral head is stable. Skeletal structures are demineralized.  IMPRESSION: No acute cardiopulmonary disease. Electronically Signed   By: Lajean Manes M.D.   On: 08/19/2016 13:23   Ct Abdomen Pelvis W Contrast  Result Date: 08/19/2016 CLINICAL DATA:  Pt c/o generalized abd pain with nausea for the past week and increased pain in the left hip with no injury.Hx of chole, appy and hyster EXAM: CT ABDOMEN AND PELVIS WITH CONTRAST TECHNIQUE: Multidetector CT imaging of the abdomen and pelvis was performed using the standard protocol following bolus administration of intravenous contrast. CONTRAST:  112mL ISOVUE-300 IOPAMIDOL (ISOVUE-300) INJECTION 61% COMPARISON:  None. FINDINGS: Lower chest: No acute abnormality. Hepatobiliary: There is mild central volume loss and subtle surface nodularity along the left liver lobe. These findings raise the possibility of cirrhosis. There is no liver mass or focal lesion. Gallbladder surgically absent. No bile duct dilation. Pancreas: Unremarkable. No pancreatic ductal dilatation or surrounding inflammatory changes. Spleen: Choose 1 Adrenals/Urinary Tract: There is an ill-defined mass arising from the  mid to upper pole the left kidney measuring 6.0 x 6.6 x 6.1 cm. Mass is contiguous with an enlarged heterogeneous adrenal gland which measures approximately 3.1 x 3.9 cm. There is mild dilation of the lower pole renal calices. Overall, there is less enhancement of the left kidney with delayed excretion. There is delayed opacification of the left renal vein but no convincing renal vein thrombus or tumor. A small stone lies in the lower pole the left kidney. No right renal masses, stones or hydronephrosis. Both ureters are normal course and in caliber. Bladder is unremarkable. Stomach/Bowel: Stomach and small bowel are unremarkable. There are numerous colonic diverticula. No diverticulitis. No other colon abnormality. Normal appendix not discretely seen. Vascular/Lymphatic: No discrete enlarged retroperitoneal or intraperitoneal lymph nodes. Atherosclerotic calcifications are noted along a normal caliber abdominal aorta. Reproductive: Status post hysterectomy. No adnexal masses. Other: Small fat containing umbilical hernia.  No ascites. Musculoskeletal: Status post ORIF of a proximal left femur fracture. The visualized orthopedic hardware is well seated. Mild compression fracture of L1, which appears old. No osteoblastic or osteolytic lesions. IMPRESSION: 1. 6.6 cm, heterogeneous left mid to upper pole renal mass highly suspicious for a renal cell carcinoma. This involves the left renal hilar structures, leading to a relative decrease in left renal enhancement as well as delayed excretion. The renal mass appears to invade and enlarged the left adrenal gland. There is no convincing extension of the mass beyond Gerota's fascia. No convincing renal vein tumor and no discrete enlarged retroperitoneal or intraperitoneal lymph nodes. 2. No acute abnormalities within the abdomen or pelvis. 3. There morphologic changes of the liver raising the possibility of cirrhosis. No liver mass or focal lesion. 4. Status post  hysterectomy and cholecystectomy. 5. Colonic diverticulosis without evidence of diverticulitis. 6. Aortic atherosclerosis. Electronically Signed   By: Lajean Manes M.D.   On: 08/19/2016 14:22

## 2016-09-04 ENCOUNTER — Telehealth: Payer: Self-pay | Admitting: Radiology

## 2016-09-04 ENCOUNTER — Inpatient Hospital Stay: Admission: RE | Admit: 2016-09-04 | Payer: Medicare HMO | Source: Ambulatory Visit

## 2016-09-04 NOTE — Telephone Encounter (Signed)
Spoke with pt's sister, Hazle Nordmann, regarding surgery scheduled 09/11/16, pre-admit testing appt on 09/05/16 @12 :00, appt with Christell Faith at [facility] on 09/05/16 @1 :15 & to call day prior to surgery for arrival time to SDS. Sister voices understanding.

## 2016-09-05 ENCOUNTER — Encounter: Payer: Self-pay | Admitting: Physician Assistant

## 2016-09-05 ENCOUNTER — Encounter
Admission: RE | Admit: 2016-09-05 | Discharge: 2016-09-05 | Disposition: A | Discharge status: Home / Self Care | Payer: Medicare HMO | Source: Ambulatory Visit | Attending: Urology | Admitting: Urology

## 2016-09-05 ENCOUNTER — Encounter: Payer: Self-pay | Admitting: *Deleted

## 2016-09-05 ENCOUNTER — Ambulatory Visit (INDEPENDENT_AMBULATORY_CARE_PROVIDER_SITE_OTHER): Payer: Medicare HMO | Admitting: Physician Assistant

## 2016-09-05 VITALS — BP 108/56 | HR 87 | Ht 61.0 in | Wt 202.0 lb

## 2016-09-05 DIAGNOSIS — N289 Disorder of kidney and ureter, unspecified: Secondary | ICD-10-CM | POA: Insufficient documentation

## 2016-09-05 DIAGNOSIS — Z01810 Encounter for preprocedural cardiovascular examination: Secondary | ICD-10-CM | POA: Diagnosis not present

## 2016-09-05 DIAGNOSIS — I2510 Atherosclerotic heart disease of native coronary artery without angina pectoris: Secondary | ICD-10-CM | POA: Diagnosis not present

## 2016-09-05 DIAGNOSIS — I5032 Chronic diastolic (congestive) heart failure: Secondary | ICD-10-CM

## 2016-09-05 DIAGNOSIS — Z01812 Encounter for preprocedural laboratory examination: Secondary | ICD-10-CM | POA: Diagnosis present

## 2016-09-05 DIAGNOSIS — E782 Mixed hyperlipidemia: Secondary | ICD-10-CM

## 2016-09-05 DIAGNOSIS — I10 Essential (primary) hypertension: Secondary | ICD-10-CM | POA: Diagnosis not present

## 2016-09-05 DIAGNOSIS — C642 Malignant neoplasm of left kidney, except renal pelvis: Secondary | ICD-10-CM

## 2016-09-05 HISTORY — DX: Acute myocardial infarction, unspecified: I21.9

## 2016-09-05 LAB — PROTIME-INR
INR: 1.07
Prothrombin Time: 13.9 seconds (ref 11.4–15.2)

## 2016-09-05 LAB — APTT: APTT: 28 s (ref 24–36)

## 2016-09-05 LAB — URINALYSIS, ROUTINE W REFLEX MICROSCOPIC
Bilirubin Urine: NEGATIVE
Hgb urine dipstick: NEGATIVE
Ketones, ur: 5 mg/dL — AB
LEUKOCYTES UA: NEGATIVE
NITRITE: NEGATIVE
PH: 5 (ref 5.0–8.0)
Protein, ur: 30 mg/dL — AB
RBC / HPF: NONE SEEN RBC/hpf (ref 0–5)
SPECIFIC GRAVITY, URINE: 1.024 (ref 1.005–1.030)

## 2016-09-05 LAB — TYPE AND SCREEN
ABO/RH(D): O POS
ANTIBODY SCREEN: NEGATIVE

## 2016-09-05 LAB — BASIC METABOLIC PANEL
ANION GAP: 12 (ref 5–15)
BUN: 21 mg/dL — ABNORMAL HIGH (ref 6–20)
CO2: 29 mmol/L (ref 22–32)
Calcium: 8.7 mg/dL — ABNORMAL LOW (ref 8.9–10.3)
Chloride: 88 mmol/L — ABNORMAL LOW (ref 101–111)
Creatinine, Ser: 1.16 mg/dL — ABNORMAL HIGH (ref 0.44–1.00)
GFR, EST AFRICAN AMERICAN: 55 mL/min — AB (ref >60)
GFR, EST NON AFRICAN AMERICAN: 48 mL/min — AB (ref >60)
Glucose, Bld: 405 mg/dL — ABNORMAL HIGH (ref 65–99)
POTASSIUM: 3.8 mmol/L (ref 3.5–5.1)
SODIUM: 129 mmol/L — AB (ref 135–145)

## 2016-09-05 LAB — SURGICAL PCR SCREEN
MRSA, PCR: POSITIVE — AB
Staphylococcus aureus: POSITIVE — AB

## 2016-09-05 MED ORDER — ASPIRIN EC 81 MG PO TBEC: 81.0000 mg | DELAYED_RELEASE_TABLET | Freq: Every day | ORAL | 3 refills | Status: AC

## 2016-09-05 NOTE — Pre-Procedure Instructions (Signed)
Blood glucose result along with medical clearance request faxed to Dr. De Hollingshead, pt reported Dr. Ronnald Collum is her medical doctor.  Unable to confirm faxed was received at DrDe Hollingshead, office, they stop answering phone at 1600. Transmission Log reports status as OK, meaning fax went through to Dr's office.

## 2016-09-05 NOTE — Patient Instructions (Addendum)
Medication Instructions:  Your physician has recommended you make the following change in your medication:  STOP taking plavix START taking 81mg  aspirin once daily   Labwork: none  Testing/Procedures: Your physician has requested that you have a lexiscan myoview. For further information please visit [email or web address]. Please follow instruction sheet, as given.  [place]  Your caregiver has ordered a Stress Test with nuclear imaging. The purpose of this test is to evaluate the blood supply to your heart muscle. This procedure is referred to as a "Non-Invasive Stress Test." This is because other than having an IV started in your vein, nothing is inserted or "invades" your body. Cardiac stress tests are done to find areas of poor blood flow to the heart by determining the extent of coronary artery disease (CAD). Some patients exercise on a treadmill, which naturally increases the blood flow to your heart, while others who are  unable to walk on a treadmill due to physical limitations have a pharmacologic/chemical stress agent called Lexiscan . This medicine will mimic walking on a treadmill by temporarily increasing your coronary blood flow.   Please note: these test may take anywhere between 2-4 hours to complete  PLEASE REPORT TO [facility] AT THE FIRST DESK WILL DIRECT YOU WHERE TO GO  Date of Procedure:___Thursday, March 15___  Arrival Time for Procedure:___8:00am_____  Instructions regarding medication:   xx____ : Hold diabetes medication morning of procedure  _xx_:  Hold metoprolol night before procedure and morning of procedure  _xx___:  Hold other medications as follows: Hold hydrochlorothiazide the morning of your test.  Please make the staff in [facility] aware that you are diabetic.  PLEASE NOTIFY THE OFFICE AT LEAST 4 HOURS IN ADVANCE IF YOU ARE UNABLE TO KEEP YOUR APPOINTMENT.  [phone] AND  PLEASE NOTIFY NUCLEAR MEDICINE AT [facility]  AT LEAST 24 HOURS IN ADVANCE IF YOU ARE UNABLE TO KEEP YOUR APPOINTMENT. [phone]  How to prepare for your Myoview test:  1. Do not eat or drink after midnight 2. No caffeine for 24 hours prior to test 3. No smoking 24 hours prior to test. 4. Your medication may be taken with water.  If your doctor stopped a medication because of this test, do not take that medication. 5. Ladies, please do not wear dresses.  Skirts or pants are appropriate. Please wear a short sleeve shirt. 6. No perfume, cologne or lotion. 7. Wear comfortable walking shoes. No heels!            Follow-Up: Your physician recommends that you schedule a follow-up appointment in: 3 months with Dr. Fletcher Anon.    Any Other Special Instructions Will Be Listed Below (If Applicable).     If you need a refill on your cardiac medications before your next appointment, please call your pharmacy.  Cardiac Nuclear Scan A cardiac nuclear scan is a test that measures blood flow to the heart when a person is resting and when he or she is exercising. The test looks for problems such as:  Not enough blood reaching a portion of the heart.  The heart muscle not working normally. You may need this test if:  You have heart disease.  You have had abnormal lab results.  You have had heart surgery or angioplasty.  You have chest pain.  You have shortness of breath. In this test, a radioactive dye (tracer) is injected into your bloodstream. After the tracer has traveled to your heart, an imaging device is used  to measure how much of the tracer is absorbed by or distributed to various areas of your heart. This procedure is usually done at a hospital and takes 2-4 hours. Tell a health care provider about:  Any allergies you have.  All medicines you are taking, including vitamins, herbs, eye drops, creams, and over-the-counter medicines.  Any problems you or family members have had with the use of anesthetic medicines.  Any  blood disorders you have.  Any surgeries you have had.  Any medical conditions you have.  Whether you are pregnant or may be pregnant. What are the risks? Generally, this is a safe procedure. However, problems may occur, including:  Serious chest pain and heart attack. This is only a risk if the stress portion of the test is done.  Rapid heartbeat.  Sensation of warmth in your chest. This usually passes quickly. What happens before the procedure?  Ask your health care provider about changing or stopping your regular medicines. This is especially important if you are taking diabetes medicines or blood thinners.  Remove your jewelry on the day of the procedure. What happens during the procedure?  An IV tube will be inserted into one of your veins.  Your health care provider will inject a small amount of radioactive tracer through the tube.  You will wait for 20-40 minutes while the tracer travels through your bloodstream.  Your heart activity will be monitored with an electrocardiogram (ECG).  You will lie down on an exam table.  Images of your heart will be taken for about 15-20 minutes.  You may be asked to exercise on a treadmill or stationary bike. While you exercise, your heart's activity will be monitored with an ECG, and your blood pressure will be checked. If you are unable to exercise, you may be given a medicine to increase blood flow to parts of your heart.  When blood flow to your heart has peaked, a tracer will again be injected through the IV tube.  After 20-40 minutes, you will get back on the exam table and have more images taken of your heart.  When the procedure is over, your IV tube will be removed. The procedure may vary among health care providers and hospitals. Depending on the type of tracer used, scans may need to be repeated 3-4 hours later. What happens after the procedure?  Unless your health care provider tells you otherwise, you may return to  your normal schedule, including diet, activities, and medicines.  Unless your health care provider tells you otherwise, you may increase your fluid intake. This will help flush the contrast dye from your body. Drink enough fluid to keep your urine clear or pale yellow.  It is up to you to get your test results. Ask your health care provider, or the department that is doing the test, when your results will be ready. Summary  A cardiac nuclear scan measures the blood flow to the heart when a person is resting and when he or she is exercising.  You may need this test if you are at risk for heart disease.  Tell your health care provider if you are pregnant.  Unless your health care provider tells you otherwise, increase your fluid intake. This will help flush the contrast dye from your body. Drink enough fluid to keep your urine clear or pale yellow. This information is not intended to replace advice given to you by your health care provider. Make sure you discuss any questions you have with  your health care provider. Document Released: 07/06/2004 Document Revised: 06/13/2016 Document Reviewed: 05/20/2013 Elsevier Interactive Patient Education  2017 [employer].

## 2016-09-05 NOTE — Pre-Procedure Instructions (Addendum)
Pt brought her meds for review, several meds pt reports she does not take because they are expensive.  Gave pt and sister brochure on [facility] across the street from the hospital, may be able to help pt with obtaining her meds.  Sherry fields RN reviewed EKG done in ED on 08/19/16, OK to proceed, no need to repeat EKG today.  Spoke with Amy at Dr. Cherrie Gauze office, [place] to use CBC result done on 08/30/16, dc CBC for today's visit.

## 2016-09-05 NOTE — Progress Notes (Signed)
Cardiology Office Note Date:  09/05/2016  Patient ID:  Anita Small, Anita Small 10-23-1950, MRN [id number] PCP:  No PCP Per Patient  Cardiologist:  Dr. Fletcher Anon, MD    Chief Complaint: Preoperative cardiac evaluation  History of Present Illness: Anita Small is a 66 y.o. female with history of CAD status post PCI/DES to the distal left circumflex in 07/2015 in the setting of a NSTEMI during admission for left hip fracture with significant contusion and hematoma to the left shoulder, chronic diastolic CHF/pulmonary hypertension, type 2 diabetes, hypertension, hyperlipidemia, obesity, status post left hip repair secondary to fracture as above who presents for preoperative cardiac evaluation for nephrectomy.   Patient was admitted to [facility] in 07/2015 for left hip fracture. She was found to have an elevated troponin with a peak of 1.83. Echocardiogram at that time showed normal LV systolic function with mild mitral regurgitation and mild pulmonary hypertension. She underwent cardiac catheterization which showed 99% stenosis in the distal left circumflex, 70% stenosis in a small nondominant right coronary artery and moderate distal LAD stenosis. She underwent successful PCI/DES placement to the left circumflex without complications. Patient was last seen by Dr. Fletcher Anon on 10/04/2015 to establish care after the above hospital admission. She was doing well at that time and denied any chest pain or shortness of breath. She was undergoing physical therapy and had been able to angulate with a walker. Patient has not been seen by our practice since that time. Patient was admitted to [facility] in mid October 2017 after suffering a fall complicated by multiple dog bites. Patient was found to be bacteremic with MRSA and GAS. She underwent TTE that showed EF of 25-05%, grade 2 diastolic dysfunction, trivial aortic insufficiency, aortic sclerosis, calcified mitral annulus with mild mitral regurgitation, mild biatrial  enlargement, mild-to-moderate tricuspid regurgitation, PASP 45 mmHg. Outside cardiology group was asked to perform TEE to evaluate for valvular vegetation. TEE on 02/10/2016 showed no evidence of valvular vegetation, with EF 55-60%, trivial aortic insufficiency, mild mitral regurgitation. She underwent successful antibiotic therapy for her bacteremia per IM. Patient was seen in the ED on 08/19/2016 for abdominal pain. Troponin negative. CT abdomen showed a left sided 6.6 cm heterogeneous left mid to upper pole renal mass which appeared to be invading the left adrenal gland measuring 3.1 x 3.9 cm highly suspicious for renal cell carcinoma. Blood glucose 530. Outpatient follow-up was advised. She returned to the ED on 08/22/2016 with continued abdominal pain along with some left hip pain. Labs checked were stable. She was given pain medication and advised to follow-up with urology. Patient saw urology on 08/28/2016 with recommendation of radical nephrectomy including the adrenal gland. This procedure is planned as a hand assisted laparoscopic procedure with possible conversion to open if needed. Urology has recommended cardiac evaluation for preoperative clearance as well as holding of both aspirin and Plavix for this procedure. This was followed by a third ED visit on 3/8 with exacerbation of her abdominal pain. Labs checked were stable. Patient's pain medications were titrated with improvement in her pain.   Patient comes in today accompanied by one of her sisters as well as with a great nephew. She reports she has done well from a cardiac standpoint since she was last seen in April/2017. Unfortunately, it appears the patient self discontinued her dual antiplatelet therapy for uncertain reasons. Patient brought all of her medications to the appointment today and upon review her last Plavix prescription was filled on 12/13/2015. Upon counting the tabs  she had 17 pills remaining from a prescription of 30 pills. Patient's  sister indicates she was unaware of the patient not taking aspirin or Plavix and took it upon herself to restart Plavix alone approximately 2 or 3 days ago. It is unclear how long the patient has been off aspirin. She does report taking her metoprolol. She notes persistent left flank and upper quadrant pain since her initial diagnosis of RCC as well as pain that she reports radiates superiorly to the left side of her chest beneath her left breast. She denies any exertional pain. No associated shortness of breath. She is sedentary at baseline and likely may not achieve 4 METs at baseline. Patient and patient's family member report decreased appetite, not supplementing with nutritional shake at this time. She denies any orthopnea, cough, or lower extremity swelling. Patient's sister acts somewhat surprised at cancer stating she was just told about this diagnosis on 3/11. Along these lines, patient asked surprised when her sister reports she has a history of rheumatoid arthritis. Patient asks her great nephew to go out to the car to get her Dr. Malachi Bonds as she is thirsty. Patient and family member have no questions or concerns from a cardiac standpoint at this time.   Past Medical History:  Diagnosis Date  . Adenomatous polyp 1999  . Anxiety   . Arthritis    Left knee  . CAD (coronary artery disease) of artery bypass graft    a. NSTEMI in 07/2015: Cath showed 99% stenosis of distal LCx  --> DES placed  . Depression    Paranoid tendencies  . Diabetes mellitus    Type II  . Diverticulosis   . GERD (gastroesophageal reflux disease)   . Hyperlipidemia   . Hypertension   . Hypothyroidism   . Myocardial infarction 2017  . Obesity     Past Surgical History:  Procedure Laterality Date  . ABDOMINAL HYSTERECTOMY  1977   One ovary left  . APPENDECTOMY    . BLADDER REPAIR  1977   Tacking  . CARDIAC CATHETERIZATION N/A 08/01/2015   Procedure: Left Heart Cath and Coronary Angiography;  Surgeon: Wellington Hampshire, MD;  Location: [facility];  Service: Cardiovascular;  Laterality: N/A;  . CARDIAC CATHETERIZATION N/A 08/01/2015   Procedure: Coronary Stent Intervention;  Surgeon: Wellington Hampshire, MD;  Location: [facility];  Service: Cardiovascular;  Laterality: N/A;  . CARDIOVASCULAR STRESS TEST  7/00   negative. No cardiolite  . CAROTID ENDARTERECTOMY  2/11   Dr Jamal Collin  . [place]  . CHOLECYSTECTOMY    . CORONARY ARTERY BYPASS GRAFT     2017  . CT ABD W & PELVIS WO CM  2002   Negative. Pituitary normal on MRI  . HEEL SPUR SURGERY  03/03   Left  . INTRAMEDULLARY (IM) NAIL INTERTROCHANTERIC Left 08/04/2015   Procedure: INTRAMEDULLARY (IM) NAIL INTERTROCHANTRIC;  Surgeon: Hessie Knows, MD;  Location: [facility];  Service: Orthopedics;  Laterality: Left;  . KNEE ARTHROSCOPY  2001   Left   Tamala Julian)  . TEE WITHOUT CARDIOVERSION N/A 04/11/2016   Procedure: TRANSESOPHAGEAL ECHOCARDIOGRAM (TEE);  Surgeon: Teodoro Spray, MD;  Location: [facility];  Service: Cardiovascular;  Laterality: N/A;  . TOTAL KNEE ARTHROPLASTY  11/12   left--Dr Tamala Julian  . TRANSTHORACIC ECHOCARDIOGRAM  3/04   normal    Current Outpatient Prescriptions  Medication Sig Dispense Refill  . bisacodyl (DULCOLAX) 5 MG EC tablet Take 1 tablet (5  mg total) by mouth daily as needed for moderate constipation. 30 tablet 0  . cetirizine (ZYRTEC) 10 MG tablet Take 10 mg by mouth as needed.     . citalopram (CELEXA) 40 MG tablet Take 40 mg by mouth daily. In am.    . hydrochlorothiazide (HYDRODIURIL) 25 MG tablet Take 25 mg by mouth daily.    Marland Kitchen HYDROmorphone (DILAUDID) 2 MG tablet Take 2 tablets (4 mg total) by mouth every 12 (twelve) hours as needed for severe pain. 21 tablet 0  . insulin lispro (HUMALOG) 100 UNIT/ML injection Inject 35 Units into the skin once. In am.    . levothyroxine (SYNTHROID, LEVOTHROID) 100 MCG tablet Take 1 tablet (100 mcg total) by mouth daily. 90 tablet 1  . metoprolol tartrate  (LOPRESSOR) 25 MG tablet TAKE 1 TABLET TWICE DAILY (Patient taking differently: 1 tablet in am.) 180 tablet 3  . ondansetron (ZOFRAN) 4 MG tablet Take 1 tablet (4 mg total) by mouth daily as needed for nausea or vomiting. 30 tablet 1  . oxyCODONE-acetaminophen (PERCOCET) 5-325 MG tablet Take 1-2 tablets by mouth every 6 (six) hours as needed for moderate pain or severe pain. 30 tablet 0  . traZODone (DESYREL) 100 MG tablet Take 1 tablet (100 mg total) by mouth at bedtime. 30 tablet 0  . clopidogrel (PLAVIX) 75 MG tablet Take 1 tablet (75 mg total) by mouth daily with breakfast. (Patient not taking: Reported on 09/05/2016) 30 tablet 0   No current facility-administered medications for this visit.     Allergies:   Codeine phosphate; Duloxetine; Meperidine hcl; Norco [hydrocodone-acetaminophen]; Venlafaxine; and Doxycycline   Social History:  The patient  reports that she quit smoking about 30 years ago. Her smoking use included Cigarettes. She has never used smokeless tobacco. She reports that she does not drink alcohol or use drugs.   Family History:  The patient's family history includes Cancer in her mother and paternal grandmother; Colon polyps in her mother; Depression in her daughter; Diabetes in her father, mother, and sister; Heart disease in her father and mother; Hypertension in her sister.  ROS:   Review of Systems  Constitutional: Positive for malaise/fatigue and weight loss. Negative for chills, diaphoresis and fever.  HENT: Negative for congestion.   Eyes: Negative for discharge and redness.  Respiratory: Negative for cough, hemoptysis, sputum production, shortness of breath and wheezing.   Cardiovascular: Positive for chest pain. Negative for palpitations, orthopnea, claudication, leg swelling and PND.  Gastrointestinal: Positive for abdominal pain and nausea. Negative for blood in stool, heartburn, melena and vomiting.  Genitourinary: Positive for flank pain. Negative for  hematuria.  Musculoskeletal: Negative for falls and myalgias.  Skin: Negative for rash.  Neurological: Positive for weakness. Negative for dizziness, tingling, tremors, sensory change, speech change, focal weakness and loss of consciousness.  Endo/Heme/Allergies: Does not bruise/bleed easily.  Psychiatric/Behavioral: Negative for substance abuse. The patient is not nervous/anxious.   All other systems reviewed and are negative.    PHYSICAL EXAM:  VS:  BP (!) 108/56 (BP Location: Left Arm, Patient Position: Sitting, Cuff Size: Normal)   Pulse 87   Ht 5\' 1"  (1.549 m)   Wt 202 lb (91.6 kg)   BMI 38.17 kg/m  BMI: Body mass index is 38.17 kg/m.  Physical Exam  Constitutional: She is oriented to person, place, and time. She appears well-developed and well-nourished.  HENT:  Head: Normocephalic and atraumatic.  Eyes: Right eye exhibits no discharge. Left eye exhibits no discharge.  Neck: Normal  range of motion. No JVD present.  Cardiovascular: Normal rate, regular rhythm, S1 normal, S2 normal and normal heart sounds.  Exam reveals no distant heart sounds, no friction rub, no midsystolic click and no opening snap.   No murmur heard. Palpation over the left parasternal muscles reproduces patient's chest pain  Pulmonary/Chest: Effort normal and breath sounds normal. No respiratory distress. She has no decreased breath sounds. She has no wheezes. She has no rales. She exhibits no tenderness.  Abdominal: Soft. She exhibits no distension. There is no tenderness.  Musculoskeletal: She exhibits no edema.  Neurological: She is alert and oriented to person, place, and time.  Skin: Skin is warm and dry. No cyanosis. Nails show no clubbing.  Psychiatric: She has a normal mood and affect. Her speech is normal and behavior is normal. Judgment and thought content normal.    EKG:  Was ordered and interpreted by me today. Shows NSR, 87 bpm, occasional PVC, nonspecific ST-T changes in lateral  leads  Recent Labs: 08/30/2016: ALT 12; BUN 14; Creatinine, Ser 1.08; Hemoglobin 13.5; Platelets 284; Potassium 3.8; Sodium 134  No results found for requested labs within last 8760 hours.   Estimated Creatinine Clearance: 52.1 mL/min (by C-G formula based on SCr of 1.08 mg/dL (H)).   Wt Readings from Last 3 Encounters:  09/05/16 202 lb (91.6 kg)  09/05/16 210 lb (95.3 kg)  08/30/16 210 lb (95.3 kg)     Other studies reviewed: Additional studies/records reviewed today include: summarized above  ASSESSMENT AND PLAN:  1. Preoperative cardiac evaluation: PREOPERATIVE CARDIAC RISK ASSESSMENT:   Revised Cardiac Risk Index:  High Risk Surgery (defined as Intraperitoneal, intrathoracic or suprainguinal vascular): yes; (laparoscopic radical nephrectomy with possible conversion to open procedure if needed)  Active CAD: yes; (h/o MI)  CHF: no  Cerebrovascular Disease: no   Diabetes: yes; On Insulin: yes  CKD (Cr >~ 2): no   Estimated Risk of Adverse Outcome: high risk for high risk surgery. Estimated Risk of MI, PE, VF/VT (Cardiac Arrest), Complete Heart Block: >11 %   ACC/AHA Guidelines for "Clearance":  Step 1 - Need for Emergency Surgery: no   If Yes - go straight to OR with perioperative surveillance  Step 2 - Active Cardiac Conditions (Unstable Angina, Decompensated HF, Significant  Arrhytmias - Complete HB, Mobitz II, Symptomatic VT or SVT, Severe Aortic Stenosis - mean gradient > 40 mmHg, Valve area < 1.0 cm2): no   If Yes - Evaluate & Treat per ACC/AHA Guidelines  Step 3 -  Low Risk Surgery: no  If Yes --> proceed to OR  If No --> Step 4  Step 4 - Functional Capacity >= 4 METS without symptoms: no  If Yes --> proceed to OR  If No --> Step 5  Step 5 --  Clinical Risk Factors (CRF)  - CAD with prior MI, prior tobacco abuse, IDDM, HLD, HTN, obesity - 6 --> proceed with stress testing --> if negative proceed to OR  -Preoperatively, patient is to stop Plavix  75 mg today (she just restarted this approximately 2 or 3 days prior). She will begin ASA 81 mg daily at this time and cardiology recommends continuation of aspirin throughout her procedure given increased risk of in-stent thrombosis -Schedule patient for Lexiscan Myoview on 09/06/16 given her atypical chest pain and nonspecific EKG changes for preoperative clearance -Await results from the above nuclear stress test to provide final cardiac clearance  2. CAD: Chest pain on exam it is reproducible to palpation. Lexiscan  Myoview as above. Her surgery is in 6 days, she will stop Plavix at this time and start ASA 81 mg daily with continuation of aspirin throughout her operation. It appears the patient had been off dual antiplatelet therapy entirely for approximately the last 9 months (PCI/DES 07/2015). Continue ASA 81 mg indefinitely, will discuss with interventional cardiology regarding reinitiation of Plavix in the postoperative time frame. Continue Lopressor 25 mg twice a day.  3. Chronic diastolic CHF/pulmonary hypertension: She does not appear grossly volume overloaded at this time. Consider starting Lasix at follow-up once she has completed her above surgery.  4. HTN: Controlled at this time. Continue current medications.  5. Obesity: Weight loss advised.  6. Recently diagnosed renal cell carcinoma: Urology is planning for left nephrectomy as above.  Disposition: F/u with Dr. Fletcher Anon, MD in 3 months.  Current medicines are reviewed at length with the patient today.  The patient did not have any concerns regarding medicines.  Melvern Banker PA-C 09/05/2016 2:25 PM     [address] [place], [place] [address] [phone]

## 2016-09-05 NOTE — Patient Instructions (Signed)
  Your procedure is scheduled DD:UKGURKY March 20th , 2018. Report to Same Day Surgery. To find out your arrival time please call [phone] between 1PM - 3PM on Monday September 10, 2016.  Remember: Instructions that are not followed completely may result in serious medical risk, up to and including death, or upon the discretion of your surgeon and anesthesiologist your surgery may need to be rescheduled.    _x___ 1. Do not eat food or drink liquids after midnight. No gum chewing or hard candies.     ____ 2. No Alcohol for 24 hours before or after surgery.   ____ 3. Bring all medications with you on the day of surgery if instructed.    __x__ 4. Notify your doctor if there is any change in your medical condition     (cold, fever, infections).    _____ 5. No smoking 24 hours prior to surgery.     Do not wear jewelry, make-up, hairpins, clips or nail polish.  Do not wear lotions, powders, or perfumes.   Do not shave 48 hours prior to surgery. Men may shave face and neck.  Do not bring valuables to the hospital.    [facility] is not responsible for any belongings or valuables.               Contacts, dentures or bridgework may not be worn into surgery.  Leave your suitcase in the car. After surgery it may be brought to your room.  For patients admitted to the hospital, discharge time is determined by your treatment team.   Patients discharged the day of surgery will not be allowed to drive home.    Please read over the following fact sheets that you were given:   [facility] Preparing for Surgery  ____ Take these medicines the morning of surgery with A SIP OF WATER:    1.   2.   3.   4.  5.  6.  ____ Fleet Enema (as directed)   ____ Use CHG Soap as directed on instruction sheet  ____ Use inhalers on the day of surgery and bring to hospital day of surgery  ____ Stop metformin 2 days prior to surgery    ____ Take 1/2 of usual insulin dose the night before surgery and none  on the morning of surgery.   _x___ Stop Plavix as per instructed by cardiologist.  __x__ Stop Anti-inflammatories such as Advil, Aleve, Ibuprofen, Motrin, Naproxen, Naprosyn, Goodies powders or aspirin products. OK to take Tylenol .   ____ Stop supplements until after surgery.    ____ Bring C-Pap to the hospital.

## 2016-09-05 NOTE — Pre-Procedure Instructions (Signed)
Pt's blood glucose was 405 at today's PAT visit.  Met B results along with a medical clearance request was faxed to Amy at Dr. Cherrie Gauze office. Received status of OK on transmission log.  Spoke with Amy at Dr. Cherrie Gauze office regarding today's BG result of 405, request for medical clearance by Dr. Kayleen Memos.

## 2016-09-05 NOTE — Pre-Procedure Instructions (Signed)
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 A.  Results faxed to Dr. Cherrie Gauze office.

## 2016-09-05 NOTE — Patient Instructions (Signed)
  Your procedure is scheduled on: Tuesday September 11, 2016. Report to Same Day Surgery. To find out your arrival time please call [phone] between 1PM - 3PM on Monday September 10, 2017.  Remember: Instructions that are not followed completely may result in serious medical risk, up to and including death, or upon the discretion of your surgeon and anesthesiologist your surgery may need to be rescheduled.    _x___ 1. Do not eat food or drink liquids after midnight. No gum chewing or hard candies.     ____ 2. No Alcohol for 24 hours before or after surgery.   ____ 3. Bring all medications with you on the day of surgery if instructed.    __x__ 4. Notify your doctor if there is any change in your medical condition     (cold, fever, infections).    _____ 5. No smoking 24 hours prior to surgery.     Do not wear jewelry, make-up, hairpins, clips or nail polish.  Do not wear lotions, powders, or perfumes.   Do not shave 48 hours prior to surgery. Men may shave face and neck.  Do not bring valuables to the hospital.    [facility] is not responsible for any belongings or valuables.               Contacts, dentures or bridgework may not be worn into surgery.  Leave your suitcase in the car. After surgery it may be brought to your room.  For patients admitted to the hospital, discharge time is determined by your treatment team.   Patients discharged the day of surgery will not be allowed to drive home.    Please read over the following fact sheets that you were given:   [facility] Preparing for Surgery  __x__ Take these medicines the morning of surgery with A SIP OF WATER:    1. citalopram (CELEXA)  2. levothyroxine (SYNTHROID, LEVOTHROID)  3. metoprolol tartrate (LOPRESSOR)   ____ Fleet Enema (as directed)   __x__ Use CHG Soap as directed on instruction sheet  ____ Use inhalers on the day of surgery and bring to hospital day of surgery  ____ Stop metformin 2 days prior to  surgery    ____ Take 1/2 of usual insulin dose the night before surgery and none on the morning of  surgery.   _x___ Stop Plavix as directed by Cardiologist.  _x___ Stop Anti-inflammatories such as Advil, Aleve, Ibuprofen, Motrin, Naproxen,  Naprosyn, Goodies powders or aspirin products. OK to take Tylenol.   ____ Stop supplements until after surgery.    ____ Bring C-Pap to the hospital.

## 2016-09-06 ENCOUNTER — Telehealth: Payer: Self-pay

## 2016-09-06 ENCOUNTER — Other Ambulatory Visit: Payer: Self-pay

## 2016-09-06 ENCOUNTER — Encounter
Admission: RE | Admit: 2016-09-06 | Discharge: 2016-09-06 | Disposition: A | Discharge status: Home / Self Care | Payer: Medicare HMO | Source: Ambulatory Visit | Attending: Physician Assistant | Admitting: Physician Assistant

## 2016-09-06 ENCOUNTER — Encounter: Payer: Self-pay | Admitting: Physician Assistant

## 2016-09-06 DIAGNOSIS — Z01810 Encounter for preprocedural cardiovascular examination: Secondary | ICD-10-CM

## 2016-09-06 DIAGNOSIS — Z22322 Carrier or suspected carrier of Methicillin resistant Staphylococcus aureus: Secondary | ICD-10-CM

## 2016-09-06 LAB — NM MYOCAR MULTI W/SPECT W/WALL MOTION / EF
CHL CUP STRESS STAGE 1 SPEED: 0 mph
CHL CUP STRESS STAGE 2 SPEED: 0 mph
CHL CUP STRESS STAGE 3 GRADE: 0 %
CHL CUP STRESS STAGE 3 HR: 88 {beats}/min
CHL CUP STRESS STAGE 4 GRADE: 0 %
CHL CUP STRESS STAGE 4 HR: 89 {beats}/min
CSEPEW: 1 METS
CSEPHR: 60 %
CSEPPHR: 88 {beats}/min
LV sys vol: 54 mL
LVDIAVOL: 100 mL (ref 46–106)
Percent of predicted max HR: 57 %
Rest HR: 77 {beats}/min
SDS: 1
SRS: 3
SSS: 1
Stage 1 Grade: 0 %
Stage 1 HR: 78 {beats}/min
Stage 2 Grade: 0 %
Stage 2 HR: 78 {beats}/min
Stage 3 Speed: 0 mph
Stage 4 DBP: 57 mmHg
Stage 4 SBP: 118 mmHg
Stage 4 Speed: 0 mph
TID: 0.85

## 2016-09-06 MED ORDER — TECHNETIUM TC 99M TETROFOSMIN IV KIT
12.1500 | PACK | Freq: Once | INTRAVENOUS | Status: AC | PRN
  Administered 2016-09-06: 12.15 via INTRAVENOUS

## 2016-09-06 MED ORDER — MUPIROCIN CALCIUM 2 % NA OINT: 1.0000 "application " | TOPICAL_OINTMENT | Freq: Two times a day (BID) | NASAL | 0 refills | Status: DC

## 2016-09-06 MED ORDER — REGADENOSON 0.4 MG/5ML IV SOLN
0.4000 mg | Freq: Once | INTRAVENOUS | Status: DC
  Filled 2016-09-06: qty 5

## 2016-09-06 MED ORDER — TECHNETIUM TC 99M TETROFOSMIN IV KIT
32.8100 | PACK | Freq: Once | INTRAVENOUS | Status: AC | PRN
  Administered 2016-09-06: 32.81 via INTRAVENOUS

## 2016-09-06 NOTE — Progress Notes (Signed)
   Patient underwent Lexiscan Myoview on 09/06/2016, to evaluate for high-risk ischemia secondary to cardiac preoperative evaluation. Stress test today showed no significant ischemia, no EKG changes concerning for ischemia at peak stress or recovery, rare PVCs noted during infusion that resolved in recovery, normal wall motion, EF estimated at 36% (felt to be secondary to GI uptake artifact as well as processing). Patient recently had both a TTE and TEE and 03/2016 that showed normal ejection fraction with normal wall motion. No indication to repeat echocardiogram prior to surgery. Patient is cleared for high-risk noncardiac surgery at a high risk. It was again explained to patient today not to take Plavix in the perioperative setting (patient reports she has not taken Plavix since she was seen in the office on 3/14). It was recommended again to the patient to take at ASA 81 mg daily, even through the perioperative period. Discussed with Dr. Rockey Situ, MD. Patient to follow-up with cardiology in 3 months.

## 2016-09-06 NOTE — Pre-Procedure Instructions (Addendum)
NOTE BACK FROM DR [facility] OFFICE. NO LONGER PATIENT THERE. LM HOME NUMBER TO PLEASE CALL BACK SO PCP CAN BE NOTIFIED NEEDS CLEARANCE REGARDING DIABETES  SARA AT DR Cherrie Gauze CALLED. PATIENT TO SEE NEW PCP [facility] 09/17/16. DOS CHANGED TO 10/02/16. SARA TO FAX CLEARANCE REQUEST TO NEW PCP.

## 2016-09-06 NOTE — Pre-Procedure Instructions (Signed)
UA FAXED TO DR BRANDON 

## 2016-09-07 LAB — URINE CULTURE

## 2016-09-11 DIAGNOSIS — E1169 Type 2 diabetes mellitus with other specified complication: Secondary | ICD-10-CM | POA: Insufficient documentation

## 2016-09-11 DIAGNOSIS — E785 Hyperlipidemia, unspecified: Secondary | ICD-10-CM

## 2016-09-12 NOTE — Pre-Procedure Instructions (Signed)
Stress test 09/06/16 and cleared high risk by cardiology. Still waiting on medical clearance. Seeing pcp 09/17/16

## 2016-09-17 ENCOUNTER — Other Ambulatory Visit: Payer: Self-pay

## 2016-09-17 DIAGNOSIS — N2889 Other specified disorders of kidney and ureter: Secondary | ICD-10-CM

## 2016-09-17 MED ORDER — OXYCODONE-ACETAMINOPHEN 5-325 MG PO TABS: 1.0000 | ORAL_TABLET | Freq: Four times a day (QID) | ORAL | 0 refills | Status: DC | PRN

## 2016-09-17 NOTE — Telephone Encounter (Signed)
Patient is needing a refill on her pain medication she has only a few left will maybe last until tomorrow

## 2016-09-24 ENCOUNTER — Telehealth: Payer: Self-pay | Admitting: Cardiovascular Disease

## 2016-09-24 ENCOUNTER — Telehealth: Payer: Self-pay | Admitting: Urology

## 2016-09-24 NOTE — Telephone Encounter (Signed)
Please call again she was talking to another doctors office.

## 2016-09-24 NOTE — Telephone Encounter (Signed)
Hazle Nordmann called and wants to know if pt's diabetic Dr Solon Augusta has cleared her. Please give pt a call [phone]

## 2016-09-24 NOTE — Telephone Encounter (Signed)
Spoke w/ Caren Griffins.  Advised her of Ryan's previous clearance documentation on 09/06/16 that pt was cleared at high risk to proceed.  Routed that note to Dr. Alden Hipp @ [phone]. Asked her to call back if we can be of further assistance.

## 2016-09-24 NOTE — Telephone Encounter (Signed)
Pt sister called, states pt having kidney surgery on 4/10. Pt did give verbal ok to speak with her sister. Pt sister states her Dr. PCP in [place] her that Dr. Fletcher Anon would not clear her for this surgery. Please call.

## 2016-09-24 NOTE — Telephone Encounter (Signed)
Attempted to contact Caren Griffins.  No answer, no machine.

## 2016-09-25 NOTE — Telephone Encounter (Signed)
Left mess to call/SW 

## 2016-09-26 ENCOUNTER — Other Ambulatory Visit: Payer: Self-pay | Admitting: Radiology

## 2016-10-01 MED ORDER — FAMOTIDINE 20 MG PO TABS
20.0000 mg | ORAL_TABLET | Freq: Once | ORAL | Status: AC
  Administered 2016-10-02: 20 mg via ORAL

## 2016-10-01 MED ORDER — CLINDAMYCIN PHOSPHATE 900 MG/50ML IV SOLN: 900.0000 mg | INTRAVENOUS | Status: DC

## 2016-10-01 NOTE — Pre-Procedure Instructions (Signed)
SEEN BY PCP 09/20/16 NOTE UNDER CHART REVIEW. DR Benard Halsted FOR ENDOCRINOLOGIST CLEARANCE BUT PLANS TO PROCEED PER AMY RN AT HER OFFICE

## 2016-10-02 ENCOUNTER — Encounter: Payer: Self-pay | Admitting: *Deleted

## 2016-10-02 ENCOUNTER — Inpatient Hospital Stay: Payer: Medicare HMO | Admitting: Anesthesiology

## 2016-10-02 ENCOUNTER — Inpatient Hospital Stay
Admission: RE | Admit: 2016-10-02 | Discharge: 2016-10-05 | DRG: 656 | Disposition: A | Discharge status: Home / Self Care | Payer: Medicare HMO | Source: Ambulatory Visit | Attending: Urology | Admitting: Urology

## 2016-10-02 ENCOUNTER — Encounter
Admission: RE | Disposition: A | Discharge status: Home / Self Care | Payer: Self-pay | Source: Ambulatory Visit | Attending: Urology

## 2016-10-02 DIAGNOSIS — Y848 Other medical procedures as the cause of abnormal reaction of the patient, or of later complication, without mention of misadventure at the time of the procedure: Secondary | ICD-10-CM | POA: Diagnosis not present

## 2016-10-02 DIAGNOSIS — E785 Hyperlipidemia, unspecified: Secondary | ICD-10-CM | POA: Diagnosis present

## 2016-10-02 DIAGNOSIS — Z7902 Long term (current) use of antithrombotics/antiplatelets: Secondary | ICD-10-CM | POA: Diagnosis not present

## 2016-10-02 DIAGNOSIS — K5730 Diverticulosis of large intestine without perforation or abscess without bleeding: Secondary | ICD-10-CM | POA: Diagnosis present

## 2016-10-02 DIAGNOSIS — Y92234 Operating room of hospital as the place of occurrence of the external cause: Secondary | ICD-10-CM | POA: Diagnosis not present

## 2016-10-02 DIAGNOSIS — Z7982 Long term (current) use of aspirin: Secondary | ICD-10-CM | POA: Diagnosis not present

## 2016-10-02 DIAGNOSIS — I25810 Atherosclerosis of coronary artery bypass graft(s) without angina pectoris: Secondary | ICD-10-CM | POA: Diagnosis present

## 2016-10-02 DIAGNOSIS — Z888 Allergy status to other drugs, medicaments and biological substances status: Secondary | ICD-10-CM

## 2016-10-02 DIAGNOSIS — S35412A Laceration of left renal artery, initial encounter: Secondary | ICD-10-CM | POA: Diagnosis not present

## 2016-10-02 DIAGNOSIS — Z8614 Personal history of Methicillin resistant Staphylococcus aureus infection: Secondary | ICD-10-CM

## 2016-10-02 DIAGNOSIS — E669 Obesity, unspecified: Secondary | ICD-10-CM | POA: Diagnosis present

## 2016-10-02 DIAGNOSIS — N2889 Other specified disorders of kidney and ureter: Secondary | ICD-10-CM | POA: Diagnosis present

## 2016-10-02 DIAGNOSIS — I252 Old myocardial infarction: Secondary | ICD-10-CM | POA: Diagnosis not present

## 2016-10-02 DIAGNOSIS — Z96652 Presence of left artificial knee joint: Secondary | ICD-10-CM | POA: Diagnosis present

## 2016-10-02 DIAGNOSIS — E1165 Type 2 diabetes mellitus with hyperglycemia: Secondary | ICD-10-CM | POA: Diagnosis present

## 2016-10-02 DIAGNOSIS — K219 Gastro-esophageal reflux disease without esophagitis: Secondary | ICD-10-CM | POA: Diagnosis present

## 2016-10-02 DIAGNOSIS — D62 Acute posthemorrhagic anemia: Secondary | ICD-10-CM | POA: Diagnosis not present

## 2016-10-02 DIAGNOSIS — Z794 Long term (current) use of insulin: Secondary | ICD-10-CM

## 2016-10-02 DIAGNOSIS — I10 Essential (primary) hypertension: Secondary | ICD-10-CM | POA: Diagnosis present

## 2016-10-02 DIAGNOSIS — C642 Malignant neoplasm of left kidney, except renal pelvis: Principal | ICD-10-CM | POA: Diagnosis present

## 2016-10-02 DIAGNOSIS — Z90710 Acquired absence of both cervix and uterus: Secondary | ICD-10-CM

## 2016-10-02 DIAGNOSIS — F419 Anxiety disorder, unspecified: Secondary | ICD-10-CM | POA: Diagnosis present

## 2016-10-02 DIAGNOSIS — M159 Polyosteoarthritis, unspecified: Secondary | ICD-10-CM | POA: Diagnosis present

## 2016-10-02 DIAGNOSIS — E039 Hypothyroidism, unspecified: Secondary | ICD-10-CM | POA: Diagnosis present

## 2016-10-02 DIAGNOSIS — Z9049 Acquired absence of other specified parts of digestive tract: Secondary | ICD-10-CM

## 2016-10-02 DIAGNOSIS — Z8249 Family history of ischemic heart disease and other diseases of the circulatory system: Secondary | ICD-10-CM

## 2016-10-02 DIAGNOSIS — Z833 Family history of diabetes mellitus: Secondary | ICD-10-CM

## 2016-10-02 DIAGNOSIS — K429 Umbilical hernia without obstruction or gangrene: Secondary | ICD-10-CM | POA: Diagnosis present

## 2016-10-02 DIAGNOSIS — Z79899 Other long term (current) drug therapy: Secondary | ICD-10-CM | POA: Diagnosis not present

## 2016-10-02 DIAGNOSIS — Z951 Presence of aortocoronary bypass graft: Secondary | ICD-10-CM

## 2016-10-02 DIAGNOSIS — Z808 Family history of malignant neoplasm of other organs or systems: Secondary | ICD-10-CM

## 2016-10-02 DIAGNOSIS — Z6839 Body mass index (BMI) 39.0-39.9, adult: Secondary | ICD-10-CM | POA: Diagnosis not present

## 2016-10-02 DIAGNOSIS — Z800 Family history of malignant neoplasm of digestive organs: Secondary | ICD-10-CM

## 2016-10-02 DIAGNOSIS — Z881 Allergy status to other antibiotic agents status: Secondary | ICD-10-CM

## 2016-10-02 DIAGNOSIS — F329 Major depressive disorder, single episode, unspecified: Secondary | ICD-10-CM | POA: Diagnosis present

## 2016-10-02 DIAGNOSIS — I2510 Atherosclerotic heart disease of native coronary artery without angina pectoris: Secondary | ICD-10-CM | POA: Diagnosis present

## 2016-10-02 DIAGNOSIS — Z885 Allergy status to narcotic agent status: Secondary | ICD-10-CM

## 2016-10-02 DIAGNOSIS — Z818 Family history of other mental and behavioral disorders: Secondary | ICD-10-CM

## 2016-10-02 DIAGNOSIS — Z87891 Personal history of nicotine dependence: Secondary | ICD-10-CM

## 2016-10-02 DIAGNOSIS — Z8543 Personal history of malignant neoplasm of ovary: Secondary | ICD-10-CM

## 2016-10-02 HISTORY — PX: ROBOT ASSISTED LAPAROSCOPIC NEPHRECTOMY: SHX5140

## 2016-10-02 LAB — GLUCOSE, CAPILLARY
Glucose-Capillary: 209 mg/dL — ABNORMAL HIGH (ref 65–99)
Glucose-Capillary: 373 mg/dL — ABNORMAL HIGH (ref 65–99)
Glucose-Capillary: 322 mg/dL — ABNORMAL HIGH (ref 65–99)

## 2016-10-02 LAB — BLOOD GAS, ARTERIAL
ACID-BASE DEFICIT: 1 mmol/L (ref 0.0–2.0)
Bicarbonate: 24.3 mmol/L (ref 20.0–28.0)
FIO2: 0.5
MECHVT: 550 mL
O2 SAT: 99.3 %
PCO2 ART: 42 mmHg (ref 32.0–48.0)
PEEP: 4 cmH2O
PH ART: 7.37 (ref 7.350–7.450)
PRESSURE SUPPORT: 10 cmH2O
Patient temperature: 37
RATE: 12 resp/min
pO2, Arterial: 156 mmHg — ABNORMAL HIGH (ref 83.0–108.0)

## 2016-10-02 LAB — HEMOGLOBIN AND HEMATOCRIT, BLOOD
HEMATOCRIT: 34.5 % — AB (ref 35.0–47.0)
HEMOGLOBIN: 11.2 g/dL — AB (ref 12.0–16.0)

## 2016-10-02 LAB — PREPARE RBC (CROSSMATCH)

## 2016-10-02 SURGERY — ROBOTIC ASSISTED LAPAROSCOPIC NEPHRECTOMY
Anesthesia: General | Site: Abdomen | Laterality: Left | Wound class: Clean Contaminated

## 2016-10-02 MED ORDER — SODIUM CHLORIDE 0.9 % IV SOLN
INTRAVENOUS | Status: DC
  Administered 2016-10-02 – 2016-10-04 (×4): via INTRAVENOUS

## 2016-10-02 MED ORDER — PROPOFOL 10 MG/ML IV BOLUS
INTRAVENOUS | Status: DC | PRN
  Administered 2016-10-02: 120 mg via INTRAVENOUS

## 2016-10-02 MED ORDER — DIPHENHYDRAMINE HCL 12.5 MG/5ML PO ELIX: 12.5000 mg | ORAL_SOLUTION | Freq: Four times a day (QID) | ORAL | Status: DC | PRN

## 2016-10-02 MED ORDER — DEXAMETHASONE SODIUM PHOSPHATE 10 MG/ML IJ SOLN
INTRAMUSCULAR | Status: AC
  Filled 2016-10-02: qty 1

## 2016-10-02 MED ORDER — FENTANYL CITRATE (PF) 100 MCG/2ML IJ SOLN
INTRAMUSCULAR | Status: AC
  Administered 2016-10-02: 25 ug via INTRAVENOUS
  Filled 2016-10-02: qty 2

## 2016-10-02 MED ORDER — METOPROLOL TARTRATE 25 MG PO TABS
25.0000 mg | ORAL_TABLET | Freq: Two times a day (BID) | ORAL | Status: DC
  Administered 2016-10-02 – 2016-10-05 (×6): 25 mg via ORAL
  Filled 2016-10-02 (×6): qty 1

## 2016-10-02 MED ORDER — FENTANYL CITRATE (PF) 100 MCG/2ML IJ SOLN
INTRAMUSCULAR | Status: AC
  Filled 2016-10-02: qty 2

## 2016-10-02 MED ORDER — METOPROLOL TARTRATE 5 MG/5ML IV SOLN
INTRAVENOUS | Status: DC | PRN
  Administered 2016-10-02: 3 mg via INTRAVENOUS
  Administered 2016-10-02: 2 mg via INTRAVENOUS

## 2016-10-02 MED ORDER — METOCLOPRAMIDE HCL 5 MG/ML IJ SOLN
INTRAMUSCULAR | Status: AC
  Administered 2016-10-02: 10 mg via INTRAVENOUS
  Filled 2016-10-02: qty 2

## 2016-10-02 MED ORDER — SUGAMMADEX SODIUM 200 MG/2ML IV SOLN
INTRAVENOUS | Status: DC | PRN
  Administered 2016-10-02: 180 mg via INTRAVENOUS

## 2016-10-02 MED ORDER — PROPOFOL 10 MG/ML IV BOLUS
INTRAVENOUS | Status: AC
  Filled 2016-10-02: qty 20

## 2016-10-02 MED ORDER — BELLADONNA ALKALOIDS-OPIUM 16.2-60 MG RE SUPP: 1.0000 | Freq: Four times a day (QID) | RECTAL | Status: DC | PRN

## 2016-10-02 MED ORDER — SUGAMMADEX SODIUM 200 MG/2ML IV SOLN
INTRAVENOUS | Status: AC
  Filled 2016-10-02: qty 2

## 2016-10-02 MED ORDER — ACETAMINOPHEN 325 MG PO TABS: 650.0000 mg | ORAL_TABLET | ORAL | Status: DC | PRN

## 2016-10-02 MED ORDER — CLINDAMYCIN PHOSPHATE 600 MG/50ML IV SOLN
600.0000 mg | Freq: Three times a day (TID) | INTRAVENOUS | Status: DC
  Administered 2016-10-02: 600 mg via INTRAVENOUS
  Filled 2016-10-02 (×3): qty 50

## 2016-10-02 MED ORDER — INSULIN ASPART 100 UNIT/ML ~~LOC~~ SOLN
0.0000 [IU] | Freq: Three times a day (TID) | SUBCUTANEOUS | Status: DC
  Administered 2016-10-02: 11 [IU] via SUBCUTANEOUS

## 2016-10-02 MED ORDER — LABETALOL HCL 5 MG/ML IV SOLN
INTRAVENOUS | Status: DC | PRN
  Administered 2016-10-02: 5 mg via INTRAVENOUS
  Administered 2016-10-02 (×2): 10 mg via INTRAVENOUS

## 2016-10-02 MED ORDER — HYDROMORPHONE HCL 1 MG/ML IJ SOLN
0.5000 mg | INTRAMUSCULAR | Status: DC | PRN
  Administered 2016-10-02 – 2016-10-03 (×3): 0.5 mg via INTRAVENOUS
  Filled 2016-10-02 (×3): qty 1

## 2016-10-02 MED ORDER — ROCURONIUM BROMIDE 100 MG/10ML IV SOLN
INTRAVENOUS | Status: DC | PRN
  Administered 2016-10-02: 10 mg via INTRAVENOUS
  Administered 2016-10-02: 20 mg via INTRAVENOUS
  Administered 2016-10-02: 10 mg via INTRAVENOUS
  Administered 2016-10-02: 20 mg via INTRAVENOUS
  Administered 2016-10-02: 10 mg via INTRAVENOUS
  Administered 2016-10-02: 30 mg via INTRAVENOUS

## 2016-10-02 MED ORDER — ARTIFICIAL TEARS OP OINT
TOPICAL_OINTMENT | OPHTHALMIC | Status: AC
  Filled 2016-10-02: qty 3.5

## 2016-10-02 MED ORDER — CIPROFLOXACIN IN D5W 400 MG/200ML IV SOLN
INTRAVENOUS | Status: DC | PRN
  Administered 2016-10-02: 400 mg via INTRAVENOUS

## 2016-10-02 MED ORDER — ONDANSETRON HCL 4 MG/2ML IJ SOLN
INTRAMUSCULAR | Status: DC | PRN
  Administered 2016-10-02 (×2): 4 mg via INTRAVENOUS

## 2016-10-02 MED ORDER — DIPHENHYDRAMINE HCL 50 MG/ML IJ SOLN
12.5000 mg | Freq: Four times a day (QID) | INTRAMUSCULAR | Status: DC | PRN
  Administered 2016-10-03: 12.5 mg via INTRAVENOUS
  Filled 2016-10-02: qty 1

## 2016-10-02 MED ORDER — EVICEL 5 ML EX KIT
PACK | CUTANEOUS | Status: AC
  Filled 2016-10-02: qty 1

## 2016-10-02 MED ORDER — HYDROCHLOROTHIAZIDE 25 MG PO TABS
25.0000 mg | ORAL_TABLET | Freq: Every day | ORAL | Status: DC
Start: 2016-10-03 — End: 2016-10-05
  Administered 2016-10-03 – 2016-10-05 (×3): 25 mg via ORAL
  Filled 2016-10-02 (×3): qty 1

## 2016-10-02 MED ORDER — SODIUM CHLORIDE FLUSH 0.9 % IV SOLN
INTRAVENOUS | Status: AC
  Filled 2016-10-02: qty 10

## 2016-10-02 MED ORDER — BUPIVACAINE HCL (PF) 0.5 % IJ SOLN
INTRAMUSCULAR | Status: AC
  Filled 2016-10-02: qty 30

## 2016-10-02 MED ORDER — INSULIN ASPART 100 UNIT/ML ~~LOC~~ SOLN
0.0000 [IU] | Freq: Every day | SUBCUTANEOUS | Status: DC
  Administered 2016-10-02: 4 [IU] via SUBCUTANEOUS
  Administered 2016-10-04: 2 [IU] via SUBCUTANEOUS
  Filled 2016-10-02: qty 2
  Filled 2016-10-02: qty 4

## 2016-10-02 MED ORDER — LORATADINE 10 MG PO TABS
10.0000 mg | ORAL_TABLET | Freq: Every day | ORAL | Status: DC
  Administered 2016-10-03 – 2016-10-05 (×3): 10 mg via ORAL
  Filled 2016-10-02 (×3): qty 1

## 2016-10-02 MED ORDER — HYDROMORPHONE HCL 1 MG/ML IJ SOLN
0.5000 mg | INTRAMUSCULAR | Status: AC | PRN
  Administered 2016-10-02 (×4): 0.5 mg via INTRAVENOUS

## 2016-10-02 MED ORDER — THROMBIN 5000 UNITS EX SOLR
CUTANEOUS | Status: AC
  Filled 2016-10-02: qty 5000

## 2016-10-02 MED ORDER — SODIUM CHLORIDE 0.9 % IV SOLN
INTRAVENOUS | Status: DC | PRN
  Administered 2016-10-02: 15:00:00 via INTRAVENOUS

## 2016-10-02 MED ORDER — LACTATED RINGERS IV SOLN
INTRAVENOUS | Status: DC | PRN
  Administered 2016-10-02: 17:00:00 via INTRAVENOUS

## 2016-10-02 MED ORDER — METOCLOPRAMIDE HCL 5 MG/ML IJ SOLN
10.0000 mg | Freq: Once | INTRAMUSCULAR | Status: AC
  Administered 2016-10-02: 10 mg via INTRAVENOUS

## 2016-10-02 MED ORDER — BUPIVACAINE LIPOSOME 1.3 % IJ SUSP
INTRAMUSCULAR | Status: AC
  Filled 2016-10-02: qty 20

## 2016-10-02 MED ORDER — ROCURONIUM BROMIDE 50 MG/5ML IV SOLN
INTRAVENOUS | Status: AC
  Filled 2016-10-02: qty 1

## 2016-10-02 MED ORDER — MANNITOL 25 % IV SOLN
INTRAVENOUS | Status: AC
  Filled 2016-10-02: qty 50

## 2016-10-02 MED ORDER — PHENYLEPHRINE HCL 10 MG/ML IJ SOLN
INTRAMUSCULAR | Status: DC | PRN
  Administered 2016-10-02: 40 ug via INTRAVENOUS
  Administered 2016-10-02 (×2): 80 ug via INTRAVENOUS

## 2016-10-02 MED ORDER — ASPIRIN EC 81 MG PO TBEC
81.0000 mg | DELAYED_RELEASE_TABLET | Freq: Every day | ORAL | Status: DC
  Administered 2016-10-03 – 2016-10-05 (×3): 81 mg via ORAL
  Filled 2016-10-02 (×3): qty 1

## 2016-10-02 MED ORDER — ONDANSETRON HCL 4 MG/2ML IJ SOLN
INTRAMUSCULAR | Status: AC
  Filled 2016-10-02: qty 2

## 2016-10-02 MED ORDER — DOCUSATE SODIUM 100 MG PO CAPS
100.0000 mg | ORAL_CAPSULE | Freq: Two times a day (BID) | ORAL | Status: DC
  Administered 2016-10-02 – 2016-10-05 (×6): 100 mg via ORAL
  Filled 2016-10-02 (×7): qty 1

## 2016-10-02 MED ORDER — ONDANSETRON HCL 4 MG/2ML IJ SOLN
4.0000 mg | INTRAMUSCULAR | Status: DC | PRN
  Administered 2016-10-04 – 2016-10-05 (×2): 4 mg via INTRAVENOUS
  Filled 2016-10-02 (×2): qty 2

## 2016-10-02 MED ORDER — HYDROMORPHONE HCL 1 MG/ML IJ SOLN
INTRAMUSCULAR | Status: AC
  Administered 2016-10-02: 0.5 mg via INTRAVENOUS
  Filled 2016-10-02: qty 1

## 2016-10-02 MED ORDER — LIDOCAINE 2% (20 MG/ML) 5 ML SYRINGE
INTRAMUSCULAR | Status: DC | PRN
  Administered 2016-10-02: 150 mg via INTRAVENOUS
  Administered 2016-10-02: 100 mg via INTRAVENOUS

## 2016-10-02 MED ORDER — HYDRALAZINE HCL 20 MG/ML IJ SOLN
INTRAMUSCULAR | Status: DC | PRN
  Administered 2016-10-02: 5 mg via INTRAVENOUS

## 2016-10-02 MED ORDER — SODIUM CHLORIDE 0.9 % IV SOLN
INTRAVENOUS | Status: DC
  Administered 2016-10-02 (×2): via INTRAVENOUS

## 2016-10-02 MED ORDER — CITALOPRAM HYDROBROMIDE 20 MG PO TABS
40.0000 mg | ORAL_TABLET | Freq: Every day | ORAL | Status: DC
  Administered 2016-10-03 – 2016-10-05 (×3): 40 mg via ORAL
  Filled 2016-10-02 (×3): qty 2

## 2016-10-02 MED ORDER — OXYCODONE HCL 5 MG PO TABS
5.0000 mg | ORAL_TABLET | ORAL | Status: DC | PRN
  Administered 2016-10-02 – 2016-10-05 (×9): 5 mg via ORAL
  Filled 2016-10-02 (×11): qty 1

## 2016-10-02 MED ORDER — MIDAZOLAM HCL 2 MG/2ML IJ SOLN
INTRAMUSCULAR | Status: AC
  Filled 2016-10-02: qty 2

## 2016-10-02 MED ORDER — DEXAMETHASONE SODIUM PHOSPHATE 10 MG/ML IJ SOLN
INTRAMUSCULAR | Status: DC | PRN
  Administered 2016-10-02: 10 mg via INTRAVENOUS

## 2016-10-02 MED ORDER — FAMOTIDINE 20 MG PO TABS
ORAL_TABLET | ORAL | Status: AC
  Administered 2016-10-02: 20 mg via ORAL
  Filled 2016-10-02: qty 1

## 2016-10-02 MED ORDER — LEVOTHYROXINE SODIUM 50 MCG PO TABS
100.0000 ug | ORAL_TABLET | Freq: Every day | ORAL | Status: DC
  Administered 2016-10-03 – 2016-10-05 (×3): 100 ug via ORAL
  Filled 2016-10-02 (×3): qty 2

## 2016-10-02 MED ORDER — SUCCINYLCHOLINE CHLORIDE 20 MG/ML IJ SOLN
INTRAMUSCULAR | Status: DC | PRN
  Administered 2016-10-02: 100 mg via INTRAVENOUS

## 2016-10-02 MED ORDER — THROMBIN 5000 UNITS EX SOLR
CUTANEOUS | Status: DC | PRN
  Administered 2016-10-02: 5000 [IU] via TOPICAL

## 2016-10-02 MED ORDER — INSULIN ASPART 100 UNIT/ML ~~LOC~~ SOLN
0.0000 [IU] | Freq: Three times a day (TID) | SUBCUTANEOUS | Status: DC
  Administered 2016-10-03: 3 [IU] via SUBCUTANEOUS
  Administered 2016-10-03 (×2): 5 [IU] via SUBCUTANEOUS
  Administered 2016-10-04: 3 [IU] via SUBCUTANEOUS
  Administered 2016-10-04: 5 [IU] via SUBCUTANEOUS
  Administered 2016-10-04: 2 [IU] via SUBCUTANEOUS
  Administered 2016-10-05: 8 [IU] via SUBCUTANEOUS
  Administered 2016-10-05: 2 [IU] via SUBCUTANEOUS
  Filled 2016-10-02: qty 5
  Filled 2016-10-02: qty 2
  Filled 2016-10-02: qty 3
  Filled 2016-10-02 (×2): qty 2
  Filled 2016-10-02: qty 8
  Filled 2016-10-02: qty 5
  Filled 2016-10-02: qty 8

## 2016-10-02 MED ORDER — ONDANSETRON HCL 4 MG/2ML IJ SOLN: 4.0000 mg | Freq: Once | INTRAMUSCULAR | Status: DC | PRN

## 2016-10-02 MED ORDER — FENTANYL CITRATE (PF) 100 MCG/2ML IJ SOLN
INTRAMUSCULAR | Status: DC | PRN
  Administered 2016-10-02 (×3): 50 ug via INTRAVENOUS
  Administered 2016-10-02: 150 ug via INTRAVENOUS
  Administered 2016-10-02: 100 ug via INTRAVENOUS
  Administered 2016-10-02: 50 ug via INTRAVENOUS

## 2016-10-02 MED ORDER — INSULIN ASPART 100 UNIT/ML ~~LOC~~ SOLN
SUBCUTANEOUS | Status: AC
  Administered 2016-10-02: 11 [IU] via SUBCUTANEOUS
  Filled 2016-10-02: qty 11

## 2016-10-02 MED ORDER — FENTANYL CITRATE (PF) 100 MCG/2ML IJ SOLN
25.0000 ug | INTRAMUSCULAR | Status: DC | PRN
  Administered 2016-10-02 (×4): 25 ug via INTRAVENOUS

## 2016-10-02 MED ORDER — FENTANYL CITRATE (PF) 250 MCG/5ML IJ SOLN
INTRAMUSCULAR | Status: AC
  Filled 2016-10-02: qty 5

## 2016-10-02 MED ORDER — INSULIN ASPART 100 UNIT/ML ~~LOC~~ SOLN: 4.0000 [IU] | Freq: Three times a day (TID) | SUBCUTANEOUS | Status: DC

## 2016-10-02 MED ORDER — BUPIVACAINE HCL 0.5 % IJ SOLN
INTRAMUSCULAR | Status: DC | PRN
  Administered 2016-10-02: 10 mL

## 2016-10-02 MED ORDER — CLINDAMYCIN PHOSPHATE 900 MG/50ML IV SOLN
INTRAVENOUS | Status: AC
  Filled 2016-10-02: qty 50

## 2016-10-02 MED ORDER — OXYBUTYNIN CHLORIDE 5 MG PO TABS: 5.0000 mg | ORAL_TABLET | Freq: Three times a day (TID) | ORAL | Status: DC | PRN

## 2016-10-02 MED ORDER — LACTATED RINGERS IV SOLN
INTRAVENOUS | Status: DC | PRN
  Administered 2016-10-02 (×2): via INTRAVENOUS

## 2016-10-02 MED ORDER — EVICEL 5 ML EX KIT
PACK | CUTANEOUS | Status: DC | PRN
  Administered 2016-10-02: 5 mL

## 2016-10-02 SURGICAL SUPPLY — 143 items
ADH SKN CLS APL DERMABOND .7 (GAUZE/BANDAGES/DRESSINGS) ×2
AGENT HMST MTR 8 SURGIFLO (HEMOSTASIS) ×2
ANCHOR TIS RET SYS 1550ML (BAG) ×4 IMPLANT
ANCHOR TIS RET SYS 235ML (MISCELLANEOUS) ×1 IMPLANT
APPLICATOR SURGIFLO (MISCELLANEOUS) ×6 IMPLANT
APPLIER CLIP ROT 10 11.4 M/L (STAPLE) ×4
APPLIER CLIP ROT 13.4 12 LRG (CLIP)
APR CLP LRG 13.4X12 ROT 20 MLT (CLIP)
APR CLP MED LRG 11.4X10 (STAPLE) ×2
BAG SPEC RTRVL C1550 25.4 (BAG) ×2
BAG TISS RTRVL C235 10X14 (MISCELLANEOUS)
BNDG COHESIVE 4X5 TAN STRL (GAUZE/BANDAGES/DRESSINGS) ×4 IMPLANT
BULB RESERV EVAC DRAIN JP 100C (MISCELLANEOUS) ×3 IMPLANT
CANISTER SUCT 1200ML W/VALVE (MISCELLANEOUS) ×4 IMPLANT
CATH TRAY 16F METER LATEX (MISCELLANEOUS) ×1 IMPLANT
CHLORAPREP W/TINT 26ML (MISCELLANEOUS) ×5 IMPLANT
CLEANER CAUTERY TIP 5X5 PAD (MISCELLANEOUS) ×2 IMPLANT
CLIP APPLIE ROT 10 11.4 M/L (STAPLE) ×2 IMPLANT
CLIP APPLIE ROT 13.4 12 LRG (CLIP) ×1 IMPLANT
CLIP LIGATING HEM O LOK PURPLE (MISCELLANEOUS) ×14 IMPLANT
CLIP LIGATING HEMO O LOK GREEN (MISCELLANEOUS) IMPLANT
CLIP SUT LAPRA TY ABSORB (SUTURE) ×7 IMPLANT
CLOSURE WOUND 1/2 X4 (GAUZE/BANDAGES/DRESSINGS)
CORD BIP STRL DISP 12FT (MISCELLANEOUS) ×4 IMPLANT
COVER TIP SHEARS 8 DVNC (MISCELLANEOUS) ×2 IMPLANT
COVER TIP SHEARS 8MM DA VINCI (MISCELLANEOUS) ×2
CUTTER ECHEON FLEX ENDO 45 340 (ENDOMECHANICALS) IMPLANT
DEFOGGER SCOPE WARMER CLEARIFY (MISCELLANEOUS) ×4 IMPLANT
DERMABOND ADVANCED (GAUZE/BANDAGES/DRESSINGS) ×2
DERMABOND ADVANCED .7 DNX12 (GAUZE/BANDAGES/DRESSINGS) ×2 IMPLANT
DISSECTOR KITTNER STICK (MISCELLANEOUS) ×1 IMPLANT
DISSECTORS/KITTNER STICK (MISCELLANEOUS)
DRAIN CHANNEL JP 15F RND 16 (MISCELLANEOUS) ×3 IMPLANT
DRAIN CHANNEL JP 19F (MISCELLANEOUS) IMPLANT
DRAIN PENROSE 1/4X12 LTX (DRAIN) ×3 IMPLANT
DRAPE INCISE IOBAN 66X45 STRL (DRAPES) ×1 IMPLANT
DRAPE SHEET LG 3/4 BI-LAMINATE (DRAPES) ×4 IMPLANT
DRAPE SURG 17X11 SM STRL (DRAPES) ×16 IMPLANT
DRIVER LRG NEEDLE DA VINCI (INSTRUMENTS) ×4
DRIVER NDLE LRG DVNC (INSTRUMENTS) ×4 IMPLANT
DRSG OPSITE POSTOP 4X8 (GAUZE/BANDAGES/DRESSINGS) ×3 IMPLANT
DRSG TEGADERM 2-3/8X2-3/4 SM (GAUZE/BANDAGES/DRESSINGS) ×9 IMPLANT
DRSG TEGADERM 4X4.75 (GAUZE/BANDAGES/DRESSINGS) IMPLANT
DRSG TELFA 3X8 NADH (GAUZE/BANDAGES/DRESSINGS) IMPLANT
ELECT REM PT RETURN 9FT ADLT (ELECTROSURGICAL) ×4
ELECTRODE REM PT RTRN 9FT ADLT (ELECTROSURGICAL) ×2 IMPLANT
FILTER LAP SMOKE EVAC STRL (MISCELLANEOUS) ×4 IMPLANT
GAUZE SPONGE NON-WVN 2X2 STRL (MISCELLANEOUS) ×3 IMPLANT
GELPORT LAPAROSCOPIC (MISCELLANEOUS) ×1 IMPLANT
GLOVE BIO SURGEON STRL SZ 6.5 (GLOVE) ×9 IMPLANT
GLOVE BIO SURGEONS STRL SZ 6.5 (GLOVE) ×3
GLOVE BIOGEL PI IND STRL 6.5 (GLOVE) ×4 IMPLANT
GLOVE BIOGEL PI INDICATOR 6.5 (GLOVE) ×4
GLOVE INDICATOR 6.5 STRL GRN (GLOVE) ×4 IMPLANT
GOWN STRL REUS W/ TWL LRG LVL3 (GOWN DISPOSABLE) ×12 IMPLANT
GOWN STRL REUS W/TWL LRG LVL3 (GOWN DISPOSABLE) ×24
GRASPER DBL FENESTRATED (INSTRUMENTS) ×2
GRASPER DBL FENESTRATED DVNC (INSTRUMENTS) ×2 IMPLANT
GRASPER SUT TROCAR 14GX15 (MISCELLANEOUS) ×4 IMPLANT
HANDLE YANKAUER SUCT BULB TIP (MISCELLANEOUS) ×3 IMPLANT
HEMOSTAT SURGICEL 2X14 (HEMOSTASIS) ×5 IMPLANT
HEMOSTAT SURGICEL 2X3 (HEMOSTASIS) ×3 IMPLANT
HOLDER FOLEY CATH W/STRAP (MISCELLANEOUS) ×4 IMPLANT
IRRIGATION STRYKERFLOW (MISCELLANEOUS) ×2 IMPLANT
IRRIGATOR STRYKERFLOW (MISCELLANEOUS) ×4
IV LACTATED RINGERS 1000ML (IV SOLUTION) ×4 IMPLANT
IV NS 1000ML (IV SOLUTION) ×4
IV NS 1000ML BAXH (IV SOLUTION) ×2 IMPLANT
KIT ACCESSORY DA VINCI DISP (KITS) ×2
KIT ACCESSORY DVNC DISP (KITS) ×2 IMPLANT
KIT PINK PAD W/HEAD ARE REST (MISCELLANEOUS) ×4
KIT PINK PAD W/HEAD ARM REST (MISCELLANEOUS) ×3 IMPLANT
KIT RM TURNOVER STRD PROC AR (KITS) ×4 IMPLANT
L-HOOK LAP DISP 36CM (ELECTROSURGICAL) ×4
LABEL OR SOLS (LABEL) ×4 IMPLANT
LHOOK LAP DISP 36CM (ELECTROSURGICAL) ×2 IMPLANT
LIGASURE LAP ATLAS 10MM 37CM (INSTRUMENTS) ×1 IMPLANT
LOOP RED MAXI  1X406MM (MISCELLANEOUS) ×2
LOOP VESSEL MAXI 1X406 RED (MISCELLANEOUS) ×2 IMPLANT
NDL HYPO 25X1 1.5 SAFETY (NEEDLE) ×1 IMPLANT
NDL INSUFFLATION 14GA 120MM (NEEDLE) ×1 IMPLANT
NDL SAFETY 18GX1.5 (NEEDLE) ×4 IMPLANT
NEEDLE HYPO 25X1 1.5 SAFETY (NEEDLE) IMPLANT
NEEDLE INSUFFLATION 14GA 120MM (NEEDLE) ×4 IMPLANT
NEEDLE INSUFFLATION 150MM (ENDOMECHANICALS) ×3 IMPLANT
NS IRRIG 500ML POUR BTL (IV SOLUTION) ×4 IMPLANT
PACK LAP CHOLECYSTECTOMY (MISCELLANEOUS) ×4 IMPLANT
PAD CLEANER CAUTERY TIP 5X5 (MISCELLANEOUS) ×2
PAD DRESSING TELFA 3X8 NADH (GAUZE/BANDAGES/DRESSINGS) IMPLANT
PENCIL ELECTRO HAND CTR (MISCELLANEOUS) ×4 IMPLANT
PROBE ULTRASOUND PROART (MISCELLANEOUS) ×1 IMPLANT
PROGRASP ENDOWRIST DA VINCI (INSTRUMENTS) ×2
PROGRASP ENDOWRIST DVNC (INSTRUMENTS) ×2 IMPLANT
RELOAD STAPLE 35X2.5 WHT THIN (STAPLE) ×2 IMPLANT
RELOAD WH ECHELON 45 (STAPLE) ×18 IMPLANT
SCISSORS METZENBAUM CVD 33 (INSTRUMENTS) ×1 IMPLANT
SLEEVE ENDOPATH XCEL 5M (ENDOMECHANICALS) ×8 IMPLANT
SOLUTION ELECTROLUBE (MISCELLANEOUS) ×4 IMPLANT
SPOGE SURGIFLO 8M (HEMOSTASIS) ×2
SPONGE LAP 18X18 5 PK (GAUZE/BANDAGES/DRESSINGS) ×4 IMPLANT
SPONGE LAP 4X18 5PK (MISCELLANEOUS) ×7 IMPLANT
SPONGE SURGIFLO 8M (HEMOSTASIS) ×1 IMPLANT
SPONGE VERSALON 2X2 STRL (MISCELLANEOUS) ×12
SPONGE VERSALON 4X4 4PLY (MISCELLANEOUS) ×7 IMPLANT
STAPLE RELOAD 2.5MM WHITE (STAPLE) ×8 IMPLANT
STAPLER SKIN PROX 35W (STAPLE) ×7 IMPLANT
STAPLER VASCULAR ECHELON 35 (CUTTER) ×4 IMPLANT
STRAP SAFETY BODY (MISCELLANEOUS) ×12 IMPLANT
STRIP CLOSURE SKIN 1/2X4 (GAUZE/BANDAGES/DRESSINGS) ×1 IMPLANT
SURGILUBE 2OZ TUBE FLIPTOP (MISCELLANEOUS) ×4 IMPLANT
SUT CHROMIC 0 CT 1 (SUTURE) IMPLANT
SUT DVC VLOC 90 3-0 CV23 UNDY (SUTURE) ×6 IMPLANT
SUT ETHILON 3-0 FS-10 30 BLK (SUTURE) ×4
SUT MNCRL AB 4-0 PS2 18 (SUTURE) ×8 IMPLANT
SUT PDS AB 0 CT1 27 (SUTURE) IMPLANT
SUT PDS AB 1 CT1 27 (SUTURE) ×12 IMPLANT
SUT PDS PLUS 0 (SUTURE)
SUT PDS PLUS AB 0 CT-2 (SUTURE) IMPLANT
SUT PROLENE 4 0 RB 1 (SUTURE) ×12
SUT PROLENE 4-0 RB1 .5 CRCL 36 (SUTURE) ×5 IMPLANT
SUT VIC AB 0 CT1 36 (SUTURE) ×8 IMPLANT
SUT VIC AB 0 CT2 27 (SUTURE) ×1 IMPLANT
SUT VIC AB 1 CT1 36 (SUTURE) ×2 IMPLANT
SUT VIC AB 2-0 CT1 (SUTURE) ×6 IMPLANT
SUT VIC AB 2-0 SH 27 (SUTURE)
SUT VIC AB 2-0 SH 27XBRD (SUTURE) ×6 IMPLANT
SUT VIC AB 2-0 UR6 27 (SUTURE) IMPLANT
SUT VIC AB 4-0 FS2 27 (SUTURE) IMPLANT
SUT VICRYL 0 AB UR-6 (SUTURE) ×6 IMPLANT
SUT VICRYL PLUS ABS 0 54 (SUTURE) IMPLANT
SUTURE EHLN 3-0 FS-10 30 BLK (SUTURE) ×2 IMPLANT
SYR 3ML LL SCALE MARK (SYRINGE) ×4 IMPLANT
TAPE CLOTH 10X20 WHT NS LF (TAPE) ×4 IMPLANT
TAPE CLOTH 2X10 WHT NS LF (TAPE) ×8
TIP RIGID 35CM EVICEL (HEMOSTASIS) ×3 IMPLANT
TRAY FOLEY W/METER SILVER 16FR (SET/KITS/TRAYS/PACK) ×4 IMPLANT
TROCAR 12M 150ML BLUNT (TROCAR) ×7 IMPLANT
TROCAR DISP BLADELESS 8 DVNC (TROCAR) ×2 IMPLANT
TROCAR DISP BLADELESS 8MM (TROCAR) ×2
TROCAR ENDOPATH XCEL 12X100 BL (ENDOMECHANICALS) ×4 IMPLANT
TROCAR XCEL 12X100 BLDLESS (ENDOMECHANICALS) ×8 IMPLANT
TROCAR XCEL NON-BLD 5MMX100MML (ENDOMECHANICALS) ×4 IMPLANT
TUBING INSUFFLATOR HI FLOW (MISCELLANEOUS) ×4 IMPLANT

## 2016-10-02 NOTE — Brief Op Note (Signed)
10/02/2016  5:56 PM  PATIENT:  Anita Small  66 y.o. female  PRE-OPERATIVE DIAGNOSIS:  LEFT RENAL MASS  POST-OPERATIVE DIAGNOSIS:  LEFT RENAL MASS  PROCEDURE:  Procedure(s): ROBOTIC ASSISTED LAPAROSCOPIC RADICAL NEPHRECTOMY (Left)  SURGEON:  Surgeon(s) and Role:    * Hollice Espy, MD - Primary    * Alexis Frock, MD - Assisting  ASSISTANTS: none   ANESTHESIA:   general  EBL:  Total I/O In: 2500 [I.V.:2200; Blood:300] Out: 500 [Urine:100; Blood:400]  Drains: 16 Fr Foley and JP drain  Specimen: left kidney  COUNTS CORRECT: YES  PLAN OF CARE: Admit to inpatient   PATIENT DISPOSITION:  PACU - hemodynamically stable.

## 2016-10-02 NOTE — H&P (Signed)
08/28/2016  --> patient was seen and examined again today on 10/02/16.  She is s/p endocrinology consult with optimization/ clearance and cardiac clearance s/p stress test.  She must continue ASA 81 per cardiology given high risk of stent thrombus, plavix held.  Understands increased risk of bleeding.  RRR/ CTAB.   3:18 PM   Anita Small 16-Mar-1951 [id number]  Referring provider: Lenard Simmer, MD [address] [place], [place] [address]      Chief Complaint  Patient presents with  . New Patient (Initial Visit)    Renal Mass    HPI: 66 year old female with multiple medical comorbidities who presents today for further evaluation of a left-sided renal mass. She initially presented approximately 10 days ago with acute onset severe left flank pain. She was found to have a large 6 x 6.6 x 6.1 cm mid to left upper pole renal mass which appears to be directly invading the left adrenal gland measuring 3.1 x 3.9 cm.  There is no obvious renal vein invasion.  She has no obvious metastatic disease. No discrete enlarged lymph nodes are appreciated. LFTs, alkaline phosphatase within normal limits. Recent chest x-ray negative for any obvious pulmonary disease.  She denies any hematuria. She does have severe left flank pain. This is not well controlled with Percocet.  She has had recent involuntary weight loss.  She denies a family history of kidney cancer. She does have a remote personal history of ovarian cancer in the 80s not requiring radiation or chemotherapy.   She has lost a significant amount of weight over the past year without trying. She reports an approximately 40 pound weight loss over the past year.  [place] 2-3.  She does live by herself, dress and bathe herself but does not prepare meals, do any housework. She has trouble getting around. She is cared for by a neighbor who stops by frequently.  She does have a significant cardiac history status post percutaneous  intervention following an STEMI in 07/2015. She is currently on aspirin and Plavix.  Previous abdominal surgeries include abdominal hysterectomy and laparoscopic cholecystectomy.  PMH:     Past Medical History:  Diagnosis Date  . Adenomatous polyp 1999  . Anxiety   . Arthritis    Left knee  . CAD (coronary artery disease) of artery bypass graft    a. NSTEMI in 07/2015: Cath showed 99% stenosis of distal LCx  --> DES placed  . Depression    Paranoid tendencies  . Diabetes mellitus    Type II  . Diverticulosis   . GERD (gastroesophageal reflux disease)   . Hyperlipidemia   . Hypertension   . Hypothyroidism   . Obesity     Surgical History:      Past Surgical History:  Procedure Laterality Date  . ABDOMINAL HYSTERECTOMY  1977   One ovary left  . APPENDECTOMY    . BLADDER REPAIR  1977   Tacking  . CARDIAC CATHETERIZATION N/A 08/01/2015   Procedure: Left Heart Cath and Coronary Angiography;  Surgeon: Wellington Hampshire, MD;  Location: [facility];  Service: Cardiovascular;  Laterality: N/A;  . CARDIAC CATHETERIZATION N/A 08/01/2015   Procedure: Coronary Stent Intervention;  Surgeon: Wellington Hampshire, MD;  Location: [facility];  Service: Cardiovascular;  Laterality: N/A;  . CARDIOVASCULAR STRESS TEST  7/00   negative. No cardiolite  . CAROTID ENDARTERECTOMY  2/11   Dr Jamal Collin  . [place]  . CHOLECYSTECTOMY    .  CT ABD W & PELVIS WO CM  2002   Negative. Pituitary normal on MRI  . HEEL SPUR SURGERY  03/03   Left  . INTRAMEDULLARY (IM) NAIL INTERTROCHANTERIC Left 08/04/2015   Procedure: INTRAMEDULLARY (IM) NAIL INTERTROCHANTRIC;  Surgeon: Hessie Knows, MD;  Location: [facility];  Service: Orthopedics;  Laterality: Left;  . KNEE ARTHROSCOPY  2001   Left   Tamala Julian)  . TEE WITHOUT CARDIOVERSION N/A 04/11/2016   Procedure: TRANSESOPHAGEAL ECHOCARDIOGRAM (TEE);  Surgeon: Teodoro Spray, MD;  Location: [facility]  [facility];  Service: Cardiovascular;  Laterality: N/A;  . TOTAL KNEE ARTHROPLASTY  11/12   left--Dr Tamala Julian  . TRANSTHORACIC ECHOCARDIOGRAM  3/04   normal    Home Medications:      Allergies as of 08/28/2016      Reactions   Codeine Phosphate Other (See Comments)   REACTION: unspecified   Duloxetine Other (See Comments)   REACTION: unspecified   Meperidine Hcl Other (See Comments)   REACTION: unspecified   Norco [hydrocodone-acetaminophen] Other (See Comments)   hallucinations   Venlafaxine Other (See Comments)   REACTION: unspecified   Doxycycline Rash               Medication List           Accurate as of 08/28/16  3:18 PM. Always use your most recent med list.           bisacodyl 5 MG EC tablet Commonly known as:  DULCOLAX Take 1 tablet (5 mg total) by mouth daily as needed for moderate constipation.   cetirizine 10 MG tablet Commonly known as:  ZYRTEC Take 10 mg by mouth at bedtime.   citalopram 40 MG tablet Commonly known as:  CELEXA Take 40 mg by mouth daily.   clopidogrel 75 MG tablet Commonly known as:  PLAVIX Take 1 tablet (75 mg total) by mouth daily with breakfast.   fenofibrate 145 MG tablet Commonly known as:  TRICOR Take 145 mg by mouth daily.   hydrochlorothiazide 25 MG tablet Commonly known as:  HYDRODIURIL Take 25 mg by mouth daily.   Insulin Glargine 300 UNIT/ML Sopn Commonly known as:  TOUJEO SOLOSTAR Inject 35 Units into the skin daily.   levothyroxine 100 MCG tablet Commonly known as:  SYNTHROID, LEVOTHROID Take 1 tablet (100 mcg total) by mouth daily.   lisinopril 20 MG tablet Commonly known as:  PRINIVIL,ZESTRIL Take 1 tablet (20 mg total) by mouth daily. Take everyday with HCTZ   metoprolol tartrate 25 MG tablet Commonly known as:  LOPRESSOR TAKE 1 TABLET TWICE DAILY   miconazole 200 MG vaginal suppository Commonly known as:  MICONAZOLE 3 Place 1 suppository (200 mg total) vaginally at  bedtime.   oxyCODONE-acetaminophen 5-325 MG tablet Commonly known as:  PERCOCET Take 1-2 tablets by mouth every 6 (six) hours as needed for moderate pain or severe pain.   sitaGLIPtin-metformin 50-1000 MG tablet Commonly known as:  JANUMET Take 1 tablet by mouth 2 (two) times daily with a meal.   traZODone 100 MG tablet Commonly known as:  DESYREL Take 1 tablet (100 mg total) by mouth at bedtime.       Allergies:  Allergies  Allergen Reactions  . Codeine Phosphate Other (See Comments)    REACTION: unspecified  . Duloxetine Other (See Comments)    REACTION: unspecified  . Meperidine Hcl Other (See Comments)    REACTION: unspecified  . Norco [Hydrocodone-Acetaminophen] Other (See Comments)    hallucinations  . Venlafaxine Other (See Comments)  REACTION: unspecified  . Doxycycline Rash    Family History: Family History  Problem Relation Age of Onset  . Cancer Mother     Breast  . Heart disease Mother   . Diabetes Mother   . Colon polyps Mother   . Heart disease Father   . Diabetes Father   . Hypertension Sister   . Diabetes Sister   . Depression Daughter   . Cancer Paternal Grandmother     ? throat CA  . Colon cancer      Unsure if mother had it   . Bladder Cancer Neg Hx   . Kidney cancer Neg Hx     Social History:  reports that she has quit smoking. Her smoking use included Cigarettes. She has never used smokeless tobacco. She reports that she drinks alcohol. She reports that she does not use drugs.  ROS: UROLOGY Frequent Urination?: Yes Hard to postpone urination?: Yes Burning/pain with urination?: No Get up at night to urinate?: Yes Leakage of urine?: No Urine stream starts and stops?: No Trouble starting stream?: No Do you have to strain to urinate?: No Blood in urine?: No Urinary tract infection?: No Sexually transmitted disease?: No Injury to kidneys or bladder?: No Painful intercourse?: No Weak  stream?: No Currently pregnant?: No Vaginal bleeding?: No Last menstrual period?: n  Gastrointestinal Nausea?: Yes Vomiting?: No Indigestion/heartburn?: No Diarrhea?: No Constipation?: No  Constitutional Fever: No Night sweats?: No Weight loss?: No Fatigue?: Yes  Skin Skin rash/lesions?: No Itching?: Yes  Eyes Blurred vision?: No Double vision?: No  Ears/Nose/Throat Sore throat?: No Sinus problems?: No  Hematologic/Lymphatic Swollen glands?: No Easy bruising?: No  Cardiovascular Leg swelling?: No Chest pain?: No  Respiratory Cough?: No Shortness of breath?: No  Endocrine Excessive thirst?: Yes  Musculoskeletal Back pain?: Yes Joint pain?: No  Neurological Headaches?: No Dizziness?: No  Psychologic Depression?: Yes Anxiety?: Yes  Physical Exam: BP (!) 169/91   Pulse 82   Ht 5' 1"  (1.549 m)   Wt 210 lb (95.3 kg)   BMI 39.68 kg/m   Constitutional:  Alert and oriented, appears to be mildly in distress secondary to left flank pain.  Accompanied by neighbor. HEENT: [place] AT, moist mucus membranes.  Trachea midline, no masses. Cardiovascular: No clubbing, cyanosis.  Mild bilateral lower extremity edema. Respiratory: Normal respiratory effort, no increased work of breathing. GI: Abdomen is soft, nontender, nondistended, no abdominal masses.  Morbidly obese abdomen with large pannus. Previous laparoscopic cholecystectomy scar is appreciated as well as a lower abdominal Pfannenstiel incision. GU: No CVA tenderness.  Skin: No rashes, bruises or suspicious lesions. Lymph: No cervical adenopathy. Neurologic: Grossly intact, no focal deficits, moving all 4 extremities.  Ambulating with cane. Psychiatric: Normal mood and affect.  Laboratory Data: RecentLabs       Lab Results  Component Value Date   WBC 7.3 08/22/2016   HGB 12.6 08/22/2016   HCT 38.2 08/22/2016   MCV 79.5 (L) 08/22/2016   PLT 305 08/22/2016      RecentLabs        Lab Results  Component Value Date   CREATININE 0.97 08/22/2016      RecentLabs       Lab Results  Component Value Date   HGBA1C 6.7 (H) 08/13/2015     Hepatic Function Latest Ref Rng & Units 08/22/2016 08/19/2016 04/08/2016  Total Protein 6.5 - 8.1 g/dL 7.3 7.7 6.9  Albumin 3.5 - 5.0 g/dL 3.4(L) 3.5 3.5  AST 15 - 41 U/L 16  20 19  ALT 14 - 54 U/L 11(L) 13(L) 16  Alk Phosphatase 38 - 126 U/L 98 126 74  Total Bilirubin 0.3 - 1.2 mg/dL 0.5 0.5 0.1(L)  Bilirubin, Direct 0.01 - 0.4 mg/dL - - -    Urinalysis Labs(Brief)          Component Value Date/Time   COLORURINE YELLOW (A) 08/22/2016 0834   APPEARANCEUR CLEAR (A) 08/22/2016 0834   APPEARANCEUR Clear 06/11/2014 1845   LABSPEC 1.023 08/22/2016 0834   LABSPEC 1.038 06/11/2014 1845   PHURINE 7.0 08/22/2016 0834   GLUCOSEU >=500 (A) 08/22/2016 0834   GLUCOSEU Negative 06/11/2014 1845   HGBUR NEGATIVE 08/22/2016 0834   HGBUR negative 06/01/2009 [place] 08/22/2016 0834   BILIRUBINUR Negative 06/11/2014 1845   KETONESUR 5 (A) 08/22/2016 0834   PROTEINUR 30 (A) 08/22/2016 0834   UROBILINOGEN 0.2 12/08/2010 1543   UROBILINOGEN 0.2 06/01/2009 1556   NITRITE NEGATIVE 08/22/2016 0834   LEUKOCYTESUR NEGATIVE 08/22/2016 0834   LEUKOCYTESUR Negative 06/11/2014 1845      Pertinent Imaging: CLINICAL DATA: Pt c/o generalized abd pain with nausea for the past week and increased pain in the left hip with no injury.Hx of chole, appy and hyster  EXAM: CT ABDOMEN AND PELVIS WITH CONTRAST  TECHNIQUE: Multidetector CT imaging of the abdomen and pelvis was performed using the standard protocol following bolus administration of intravenous contrast.  CONTRAST: 119m ISOVUE-300 IOPAMIDOL (ISOVUE-300) INJECTION 61%  COMPARISON: None.  FINDINGS: Lower chest: No acute abnormality.  Hepatobiliary: There is mild central volume loss and subtle surface nodularity along the  left liver lobe. These findings raise the possibility of cirrhosis. There is no liver mass or focal lesion. Gallbladder surgically absent. No bile duct dilation.  Pancreas: Unremarkable. No pancreatic ductal dilatation or surrounding inflammatory changes.  Spleen: Choose 1  Adrenals/Urinary Tract: There is an ill-defined mass arising from the mid to upper pole the left kidney measuring 6.0 x 6.6 x 6.1 cm. Mass is contiguous with an enlarged heterogeneous adrenal gland which measures approximately 3.1 x 3.9 cm. There is mild dilation of the lower pole renal calices. Overall, there is less enhancement of the left kidney with delayed excretion. There is delayed opacification of the left renal vein but no convincing renal vein thrombus or tumor. A small stone lies in the lower pole the left kidney.  No right renal masses, stones or hydronephrosis.  Both ureters are normal course and in caliber. Bladder is unremarkable.  Stomach/Bowel: Stomach and small bowel are unremarkable. There are numerous colonic diverticula. No diverticulitis. No other colon abnormality. Normal appendix not discretely seen.  Vascular/Lymphatic: No discrete enlarged retroperitoneal or intraperitoneal lymph nodes. Atherosclerotic calcifications are noted along a normal caliber abdominal aorta.  Reproductive: Status post hysterectomy. No adnexal masses.  Other: Small fat containing umbilical hernia. No ascites.  Musculoskeletal: Status post ORIF of a proximal left femur fracture. The visualized orthopedic hardware is well seated. Mild compression fracture of L1, which appears old. No osteoblastic or osteolytic lesions.  IMPRESSION: 1. 6.6 cm, heterogeneous left mid to upper pole renal mass highly suspicious for a renal cell carcinoma. This involves the left renal hilar structures, leading to a relative decrease in left renal enhancement as well as delayed excretion. The renal mass appears  to invade and enlarged the left adrenal gland. There is no convincing extension of the mass beyond Gerota's fascia. No convincing renal vein tumor and no discrete enlarged retroperitoneal or intraperitoneal lymph nodes. 2. No acute  abnormalities within the abdomen or pelvis. 3. There morphologic changes of the liver raising the possibility of cirrhosis. No liver mass or focal lesion. 4. Status post hysterectomy and cholecystectomy. 5. Colonic diverticulosis without evidence of diverticulitis. 6. Aortic atherosclerosis.   Electronically Signed By: Lajean Manes M.D. On: 08/19/2016 14:22  CT abdomen and pelvis personally reviewed today with the patient.  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 No evidence of metastatic disease.    1. Renal mass CT images were reviewed with the patient. I have recommended radical nephrectomy including the adrenal gland. We'll plan to perform this procedure as a hand-assisted laparoscopic procedure with possible conversion to open if needed. Risk and benefits of the procedure were explained in detail. Specifically, risks including conversion to open, severe bleeding, need for blood transfusion, damage to surrounding structures including bowel, spleen, major vessels, infection,  renal compromise with progression to end-stage renal disease, heart attack, stroke,PE/ DVT,  and death were all described in detail.  Overall, she has multiple medical comorbidities including significant coronary artery disease. She will need cardiac clearance to proceed with surgery and aspirin and Plavix will need to be held for this procedure. She understands by holding these medications, she is at higher risk for stroke or heart attack or even death.   She is also aware that despite surgical intervention, she is at risk for development of metastatic disease and will be monitored postoperatively for  this.  The postoperative course was discussed in detail. Given her medical comorbidities and ECOG status, she will likely be discharged to rehabilitation following the procedure.  All her questions were answered today in detail. She is anxious to proceed with surgery given her severe pain.   We'll plan to repeat all preop labs. We'll plan to give her clindamycin given her history of a recent MRSA infection.  2. Flank pain Likely secondary to large invasive left renal mass.  She was given additional prescription today for narcotics in the form of dilaudid.  She states that she will not be able to fill this prescription until tomorrow due to finances. She was given a dose of Toradol today in the office to help with her severe flank pain.  - ketorolac (TORADOL) injection 30 mg; Inject 1 mL (30 mg total) into the muscle once.  Hollice Espy, MD  [facility] [address] [place], [place] [address] [phone]  I spent 45 min with this patient of which greater than 50% was spent in counseling and coordination of care with the patient.

## 2016-10-02 NOTE — Transfer of Care (Signed)
Immediate Anesthesia Transfer of Care Note  Patient: Anita Small  Procedure(s) Performed: Procedure(s): ROBOTIC ASSISTED LAPAROSCOPIC RADICAL NEPHRECTOMY (Left)  Patient Location: PACU  Anesthesia Type:General  Level of Consciousness: sedated  Airway & Oxygen Therapy: Patient Spontanous Breathing and Patient connected to face mask oxygen  Post-op Assessment: Report given to RN and Post -op Vital signs reviewed and stable  Post vital signs: Reviewed and stable  Last Vitals:  Vitals:   10/02/16 1047 10/02/16 1803  BP: (!) 96/45 155/60  Pulse: (!) 49 75  Resp: 18 20  Temp: 37 C (P) 36.3 C    Last Pain:  Vitals:   10/02/16 1047  TempSrc: Oral  PainSc: 8          Complications: No apparent anesthesia complications

## 2016-10-02 NOTE — Consult Note (Signed)
[facility] NAME: Anita Small    MR#:  [id number]  DATE OF BIRTH:  1951-01-26  DATE OF CONSULT:  10/02/2016  PRIMARY CARE PHYSICIAN:  Leonel Ramsay, MD  REQUESTING/REFERRING PHYSICIAN: Dr. Hollice Espy  CHIEF COMPLAINT:  No chief complaint on file. Uncontrolled Diabetes, Medical management.   HISTORY OF PRESENT ILLNESS:  Anita Small  is a 66 y.o. female with a known history of Renal artery disease, diabetes, hypertension, hyperlipidemia, hypothyroidism, osteoarthritis who was admitted to the urology service after having a left-sided nephrectomy for renal cell carcinoma. Hospitalist services were contacted for medical management and management of her uncontrolled diabetes. Patient presently is complaining of abdominal pain from a recent left-sided nephrectomy but denies any nausea vomiting fevers chills cough chest pain shortness of breath or any other associated symptoms presently. Patient does see an endocrinologist to manage her diabetes but cannot recall his name.  PAST MEDICAL HISTORY:   Past Medical History:  Diagnosis Date  . Adenomatous polyp 1999  . Anxiety   . Arthritis    Left knee  . CAD (coronary artery disease) of artery bypass graft    a. NSTEMI in 07/2015: Cath showed 99% stenosis of distal LCx  --> DES placed  . Depression    Paranoid tendencies  . Diabetes mellitus    Type II  . Diverticulosis   . GERD (gastroesophageal reflux disease)   . Hyperlipidemia   . Hypertension   . Hypothyroidism   . Myocardial infarction 2017  . Obesity     PAST SURGICAL HISTOIRY:   Past Surgical History:  Procedure Laterality Date  . ABDOMINAL HYSTERECTOMY  1977   One ovary left  . APPENDECTOMY    . BLADDER REPAIR  1977   Tacking  . CARDIAC CATHETERIZATION N/A 08/01/2015   Procedure: Left Heart Cath and Coronary Angiography;  Surgeon: Wellington Hampshire, MD;  Location: [place] [facility];  Service:  Cardiovascular;  Laterality: N/A;  . CARDIAC CATHETERIZATION N/A 08/01/2015   Procedure: Coronary Stent Intervention;  Surgeon: Wellington Hampshire, MD;  Location: [place] [facility];  Service: Cardiovascular;  Laterality: N/A;  . CARDIOVASCULAR STRESS TEST  7/00   negative. No cardiolite  . CAROTID ENDARTERECTOMY  2/11   Dr Jamal Collin  . [place]  . CHOLECYSTECTOMY    . CORONARY ARTERY BYPASS GRAFT     2017  . CT ABD W & PELVIS WO CM  2002   Negative. Pituitary normal on MRI  . HEEL SPUR SURGERY  03/03   Left  . INTRAMEDULLARY (IM) NAIL INTERTROCHANTERIC Left 08/04/2015   Procedure: INTRAMEDULLARY (IM) NAIL INTERTROCHANTRIC;  Surgeon: Hessie Knows, MD;  Location: [facility];  Service: Orthopedics;  Laterality: Left;  . KNEE ARTHROSCOPY  2001   Left   Tamala Julian)  . TEE WITHOUT CARDIOVERSION N/A 04/11/2016   Procedure: TRANSESOPHAGEAL ECHOCARDIOGRAM (TEE);  Surgeon: Teodoro Spray, MD;  Location: [facility];  Service: Cardiovascular;  Laterality: N/A;  . TOTAL KNEE ARTHROPLASTY  11/12   left--Dr Tamala Julian  . TRANSTHORACIC ECHOCARDIOGRAM  3/04   normal    SOCIAL HISTORY:   Social History  Substance Use Topics  . Smoking status: Former Smoker    Types: Cigarettes    Quit date: 09/06/1986  . Smokeless tobacco: Never Used  . Alcohol use No    FAMILY HISTORY:   Family History  Problem Relation Age of Onset  . Cancer Mother  Breast  . Heart disease Mother   . Diabetes Mother   . Colon polyps Mother   . Heart disease Father   . Diabetes Father   . Hypertension Sister   . Diabetes Sister   . Depression Daughter   . Cancer Paternal Grandmother     ? throat CA  . Colon cancer      Unsure if mother had it   . Bladder Cancer Neg Hx   . Kidney cancer Neg Hx     DRUG ALLERGIES:   Allergies  Allergen Reactions  . Codeine Phosphate Other (See Comments)    REACTION: unspecified  . Duloxetine Other (See Comments)    REACTION: unspecified  . Meperidine Hcl Other (See  Comments)    REACTION: unspecified  . Norco [Hydrocodone-Acetaminophen] Other (See Comments)    hallucinations  . Venlafaxine Other (See Comments)    REACTION: unspecified  . Doxycycline Hives and Rash    REVIEW OF SYSTEMS:   Review of Systems  Constitutional: Negative for fever and weight loss.  HENT: Negative for congestion, nosebleeds and tinnitus.   Eyes: Negative for blurred vision, double vision and redness.  Respiratory: Negative for cough, hemoptysis and shortness of breath.   Cardiovascular: Negative for chest pain, orthopnea, leg swelling and PND.  Gastrointestinal: Positive for abdominal pain. Negative for diarrhea, melena, nausea and vomiting.  Genitourinary: Negative for dysuria, hematuria and urgency.  Musculoskeletal: Negative for falls and joint pain.  Neurological: Negative for dizziness, tingling, sensory change, focal weakness, seizures, weakness and headaches.  Endo/Heme/Allergies: Negative for polydipsia. Does not bruise/bleed easily.  Psychiatric/Behavioral: Negative for depression and memory loss. The patient is not nervous/anxious.      MEDICATIONS AT HOME:   Prior to Admission medications   Medication Sig Start Date End Date Taking? Authorizing Provider  aspirin EC 81 MG tablet Take 1 tablet (81 mg total) by mouth daily. 09/05/16  Yes Ryan M Dunn, PA-C  citalopram (CELEXA) 40 MG tablet Take 40 mg by mouth daily. In am.   Yes Historical Provider, MD  hydrochlorothiazide (HYDRODIURIL) 25 MG tablet Take 25 mg by mouth daily.   Yes Historical Provider, MD  levothyroxine (SYNTHROID, LEVOTHROID) 100 MCG tablet Take 1 tablet (100 mcg total) by mouth daily. 02/25/14  Yes Venia Carbon, MD  metoprolol tartrate (LOPRESSOR) 25 MG tablet TAKE 1 TABLET TWICE DAILY Patient taking differently: 1 tablet in am. 12/05/15  Yes Wellington Hampshire, MD  cetirizine (ZYRTEC) 10 MG tablet Take 10 mg by mouth as needed.     Historical Provider, MD      VITAL SIGNS:  Blood  pressure (!) 130/45, pulse 80, temperature 97.4 F (36.3 C), temperature source Oral, resp. rate 18, SpO2 98 %.  PHYSICAL EXAMINATION:  GENERAL:  66 y.o.-year-old patient lying in bed lethargic but in NAD.   EYES: Pupils equal, round, reactive to light and accommodation. No scleral icterus. Extraocular muscles intact.  HEENT: Head atraumatic, normocephalic. Oropharynx and nasopharynx clear.  NECK:  Supple, no jugular venous distention. No thyroid enlargement, no tenderness.  LUNGS: Normal breath sounds bilaterally, no wheezing, rales, rhonchi . No use of accessory muscles of respiration.  CARDIOVASCULAR: S1, S2, RRR. No murmurs, rubs, gallops, clicks.  ABDOMEN: Soft, nontender, nondistended. Bowel sounds present. No organomegaly or mass.  Left side of abdomen dressing from recent nephrectomy.  EXTREMITIES: No pedal edema, cyanosis, or clubbing.  NEUROLOGIC: Cranial nerves II through XII are intact. No focal motor or sensory deficits appreciated bilaterally  PSYCHIATRIC: The  patient is alert and oriented x 3.  SKIN: No obvious rash, lesion, or ulcer.   LABORATORY PANEL:   CBC  Recent Labs Lab 10/02/16 1447  HGB 11.2*  HCT 34.5*   ------------------------------------------------------------------------------------------------------------------  Chemistries  No results for input(s): NA, K, CL, CO2, GLUCOSE, BUN, CREATININE, CALCIUM, MG, AST, ALT, ALKPHOS, BILITOT in the last 168 hours.  Invalid input(s): GFRCGP ------------------------------------------------------------------------------------------------------------------  Cardiac Enzymes No results for input(s): TROPONINI in the last 168 hours. ------------------------------------------------------------------------------------------------------------------  RADIOLOGY:  No results found.   IMPRESSION AND PLAN:   66 year old female with past medical history of diabetes, hypertension, hyperlipidemia, GERD, coronary artery  disease, osteoarthritis, anxiety, hypothyroidism who was admitted to the urology service for a left-sided nephrectomy for renal cell carcinoma and hospitalist services were contacted for medical management and also for her uncontrolled diabetes.  1. Uncontrolled diabetes-patient is followed by endocrinologist in [place]. Her last hemoglobin A1c was as high as 14. She says she is compliant with her insulin. -Since she is not eating much after surgery continue sliding scale insulin for now. -I will consult diabetes coordinator and adjust her insulin accordingly.  2. Status post left-sided nephrectomy due to renal cell carcinoma-continue further care as per urology. -Continue pain control as per urology.  3. Essential hypertension-continue metoprolol, HCTZ.  4. Depression-continue Celexa.  5. Hypothyroidism-continue Synthroid.  Thanks for the consult and will follow with you.   All the records are reviewed and case discussed with Consulting provider. Management plans discussed with the patient, family and they are in agreement.  CODE STATUS: Full code  TOTAL TIME TAKING CARE OF THIS PATIENT: 40 minutes.    Henreitta Leber M.D on 10/02/2016 at 9:17 PM  Between 7am to 6pm - Pager - [phone]  After 6pm go to [email or web address] - password [email or web address] [facility]  [facility]  Office  [phone]  CC: Primary care Physician: Leonel Ramsay, MD

## 2016-10-02 NOTE — Anesthesia Procedure Notes (Signed)
Arterial Line Insertion Start/End4/03/2017 12:30 PM Performed by: CRNA  Patient location: Pre-op. Preanesthetic checklist: patient identified, IV checked, site marked, risks and benefits discussed, surgical consent, monitors and equipment checked, pre-op evaluation, timeout performed and anesthesia consent Lidocaine 1% used for infiltration Left, radial was placed Catheter size: 20 Fr Hand hygiene performed  and maximum sterile barriers used   Attempts: 1 Procedure performed without using ultrasound guided technique. Following insertion, dressing applied. Post procedure assessment: normal and unchanged

## 2016-10-02 NOTE — Anesthesia Procedure Notes (Signed)
Procedure Name: Intubation Date/Time: 10/02/2016 12:15 PM Performed by: Marsh Dolly Pre-anesthesia Checklist: Patient identified, Patient being monitored, Timeout performed, Emergency Drugs available and Suction available Patient Re-evaluated:Patient Re-evaluated prior to inductionOxygen Delivery Method: Circle system utilized Preoxygenation: Pre-oxygenation with 100% oxygen Intubation Type: IV induction Ventilation: Mask ventilation without difficulty Laryngoscope Size: 3 and Miller Grade View: Grade I Tube type: Oral Tube size: 7.0 mm Number of attempts: 1 Placement Confirmation: ETT inserted through vocal cords under direct vision,  positive ETCO2 and breath sounds checked- equal and bilateral Secured at: 21 cm Tube secured with: Tape Dental Injury: Teeth and Oropharynx as per pre-operative assessment

## 2016-10-02 NOTE — Anesthesia Post-op Follow-up Note (Cosign Needed)
Anesthesia QCDR form completed.        

## 2016-10-02 NOTE — Anesthesia Postprocedure Evaluation (Signed)
Anesthesia Post Note  Patient: Anita Small  Procedure(s) Performed: Procedure(s) (LRB): ROBOTIC ASSISTED LAPAROSCOPIC RADICAL NEPHRECTOMY (Left)  Patient location during evaluation: PACU Anesthesia Type: General Level of consciousness: awake and alert Pain management: pain level controlled Vital Signs Assessment: post-procedure vital signs reviewed and stable Respiratory status: spontaneous breathing and respiratory function stable Cardiovascular status: stable Anesthetic complications: no     Last Vitals:  Vitals:   10/02/16 1047 10/02/16 1803  BP: (!) 96/45   Pulse: (!) 49   Resp: 18   Temp: 37 C 36.3 C    Last Pain:  Vitals:   10/02/16 1803  TempSrc:   PainSc: 5                  KEPHART,WILLIAM K

## 2016-10-02 NOTE — Anesthesia Preprocedure Evaluation (Signed)
Anesthesia Evaluation  Patient identified by MRN, date of birth, ID band Patient awake    Reviewed: Allergy & Precautions, H&P , NPO status , Patient's Chart, lab work & pertinent test results, reviewed documented beta blocker date and time   Airway Mallampati: III  TM Distance: >3 FB Neck ROM: full    Dental  (+) Upper Dentures, Lower Dentures   Pulmonary neg pulmonary ROS, former smoker,    Pulmonary exam normal        Cardiovascular Exercise Tolerance: Poor hypertension, On Medications + CAD, + Past MI and + Peripheral Vascular Disease  (-) Cardiac Stents, (-) CABG and (-) Orthopnea negative cardio ROS Normal cardiovascular exam Rhythm:regular Rate:Normal     Neuro/Psych PSYCHIATRIC DISORDERS negative neurological ROS  negative psych ROS   GI/Hepatic negative GI ROS, Neg liver ROS, GERD  Medicated,  Endo/Other  negative endocrine ROSdiabetesHypothyroidism   Renal/GU negative Renal ROS  negative genitourinary   Musculoskeletal negative musculoskeletal ROS (+)   Abdominal   Peds negative pediatric ROS (+)  Hematology negative hematology ROS (+)   Anesthesia Other Findings Past Medical History: 1999: Adenomatous polyp No date: Anxiety No date: Arthritis     Comment: Left knee No date: CAD (coronary artery disease) of artery bypass*     Comment: a. NSTEMI in 07/2015: Cath showed 99% stenosis              of distal LCx  --> DES placed No date: Depression     Comment: Paranoid tendencies No date: Diabetes mellitus     Comment: Type II No date: Diverticulosis No date: GERD (gastroesophageal reflux disease) No date: Hyperlipidemia No date: Hypertension No date: Hypothyroidism 2017: Myocardial infarction No date: Obesity Past Surgical History: 1977: ABDOMINAL HYSTERECTOMY     Comment: One ovary left No date: APPENDECTOMY 1977: BLADDER REPAIR     Comment: Tacking 08/01/2015: CARDIAC CATHETERIZATION N/A   Comment: Procedure: Left Heart Cath and Coronary               Angiography;  Surgeon: Wellington Hampshire, MD;                Location: [place] [facility];  Service:               Cardiovascular;  Laterality: N/A; 08/01/2015: CARDIAC CATHETERIZATION N/A     Comment: Procedure: Coronary Stent Intervention;                Surgeon: Wellington Hampshire, MD;  Location: [place] [facility];  Service: Cardiovascular;                Laterality: N/A; 7/00: CARDIOVASCULAR STRESS TEST     Comment: negative. No cardiolite 2/11: CAROTID ENDARTERECTOMY     Comment: Dr Jamal Collin 1999: CERVICAL SPINE SURGERY No date: CHOLECYSTECTOMY No date: CORONARY ARTERY BYPASS GRAFT     Comment: 2017 2002: CT ABD W & PELVIS WO CM     Comment: Negative. Pituitary normal on MRI 03/03: HEEL SPUR SURGERY     Comment: Left 08/04/2015: INTRAMEDULLARY (IM) NAIL INTERTROCHANTERIC Left     Comment: Procedure: INTRAMEDULLARY (IM) NAIL               INTERTROCHANTRIC;  Surgeon: Hessie Knows, MD;                Location: [facility];  Service: Orthopedics;  Laterality: Left; 2001: KNEE ARTHROSCOPY     Comment: Left   Tamala Julian) 04/11/2016: TEE WITHOUT CARDIOVERSION N/A     Comment: Procedure: TRANSESOPHAGEAL ECHOCARDIOGRAM               (TEE);  Surgeon: Teodoro Spray, MD;  Location:              [facility];  Service: Cardiovascular;                Laterality: N/A; 11/12: TOTAL KNEE ARTHROPLASTY     Comment: left--Dr Tamala Julian 3/04: TRANSTHORACIC ECHOCARDIOGRAM     Comment: normal   Reproductive/Obstetrics negative OB ROS                             Anesthesia Physical Anesthesia Plan  ASA: III  Anesthesia Plan: General ETT   Post-op Pain Management:    Induction:   Airway Management Planned:   Additional Equipment:   Intra-op Plan:   Post-operative Plan:   Informed Consent: I have reviewed the patients History and Physical, chart, labs and discussed the procedure  including the risks, benefits and alternatives for the proposed anesthesia with the patient or authorized representative who has indicated his/her understanding and acceptance.   Dental Advisory Given  Plan Discussed with: CRNA  Anesthesia Plan Comments:         Anesthesia Quick Evaluation

## 2016-10-03 ENCOUNTER — Encounter: Payer: Self-pay | Admitting: Urology

## 2016-10-03 LAB — CBC
HEMATOCRIT: 31.3 % — AB (ref 35.0–47.0)
HEMOGLOBIN: 10.1 g/dL — AB (ref 12.0–16.0)
MCH: 26.1 pg (ref 26.0–34.0)
MCHC: 32.4 g/dL (ref 32.0–36.0)
MCV: 80.4 fL (ref 80.0–100.0)
Platelets: 312 10*3/uL (ref 150–440)
RBC: 3.89 MIL/uL (ref 3.80–5.20)
RDW: 15.4 % — ABNORMAL HIGH (ref 11.5–14.5)
WBC: 10.4 10*3/uL (ref 3.6–11.0)

## 2016-10-03 LAB — BASIC METABOLIC PANEL
ANION GAP: 7 (ref 5–15)
BUN: 23 mg/dL — ABNORMAL HIGH (ref 6–20)
CHLORIDE: 104 mmol/L (ref 101–111)
CO2: 25 mmol/L (ref 22–32)
Calcium: 8.2 mg/dL — ABNORMAL LOW (ref 8.9–10.3)
Creatinine, Ser: 1.2 mg/dL — ABNORMAL HIGH (ref 0.44–1.00)
GFR calc non Af Amer: 46 mL/min — ABNORMAL LOW (ref >60)
GFR, EST AFRICAN AMERICAN: 53 mL/min — AB (ref >60)
Glucose, Bld: 287 mg/dL — ABNORMAL HIGH (ref 65–99)
Potassium: 4.3 mmol/L (ref 3.5–5.1)
Sodium: 136 mmol/L (ref 135–145)

## 2016-10-03 LAB — GLUCOSE, CAPILLARY
GLUCOSE-CAPILLARY: 229 mg/dL — AB (ref 65–99)
GLUCOSE-CAPILLARY: 245 mg/dL — AB (ref 65–99)
GLUCOSE-CAPILLARY: 170 mg/dL — AB (ref 65–99)
GLUCOSE-CAPILLARY: 139 mg/dL — AB (ref 65–99)

## 2016-10-03 MED ORDER — CLINDAMYCIN PHOSPHATE 600 MG/50ML IV SOLN
600.0000 mg | Freq: Three times a day (TID) | INTRAVENOUS | Status: AC
  Administered 2016-10-03: 600 mg via INTRAVENOUS
  Filled 2016-10-03: qty 50

## 2016-10-03 MED ORDER — INSULIN GLARGINE 100 UNIT/ML ~~LOC~~ SOLN
20.0000 [IU] | Freq: Every day | SUBCUTANEOUS | Status: DC
  Administered 2016-10-03 – 2016-10-05 (×3): 20 [IU] via SUBCUTANEOUS
  Filled 2016-10-03 (×4): qty 0.2

## 2016-10-03 NOTE — Progress Notes (Addendum)
Inpatient Diabetes Program Recommendations  AACE/ADA: New Consensus Statement on Inpatient Glycemic Control (2015)  Target Ranges:  Prepandial:   less than 140 mg/dL      Peak postprandial:   less than 180 mg/dL (1-2 hours)      Critically ill patients:  140 - 180 mg/dL   Results for Anita Small, Anita Small (MRN [id number]) as of 10/03/2016 08:39  Ref. Range 10/02/2016 10:45 10/02/2016 18:06 10/02/2016 21:57 10/03/2016 08:15  Glucose-Capillary Latest Ref Range: 65 - 99 mg/dL 209 (H) 373 (H) 322 (H) 229 (H)   Review of Glycemic Control  Diabetes history: DM2 Outpatient Diabetes medications: Per Care Everywhere, patient should be taking Lantus 24 units daily, Humalog 10 units with meals plus 0-10 units for correction TID Current orders for Inpatient glycemic control: Novolog 0-15 units TID with meals, Novolog 0-5 units QHS  Inpatient Diabetes Program Recommendations:  Insulin - Basal: Please consider ordering Lantus 20 units Q24H starting now. Insulin - Meal Coverage: Please consider ordering Novolog 5 units TID with meals for meal coverage if patient is eating at least 50% of meals. HgbA1C: Per Care Everywhere, A1C was >14% on 09/11/16.  Outpatient DM management: Patient is followed by Dr. Shelle Iron and seen her on 10/01/16 and has a follow up visit scheduled for 10/22/16.  Addendum 10/03/16@11 :00 am: Spoke with patient regarding DM control and outpatient DM regimen. Patient is alert and oriented by states that she is seeing "green bugs on the walls".  Patient reports that she is followed by an Endocrinologist but does not recall their name. Patient states that she takes Lantus and Humalog insulin (pens) at home but is not able to verbalize exactly how many units of each that she is taking. She reports that her sister tells her how many units to dial up on her insulin pen and administer. Patient requested that her sister be called for more information because she is uncertain about dosages. Called patient's  sister Hazle Nordmann; [phone]) and left a message requesting her to call to provide additional information for outpatient DM control.  Thanks, Barnie Alderman, RN, MSN, CDE Diabetes Coordinator Inpatient Diabetes Program [phone] (Team Pager from 8am to 5pm)

## 2016-10-03 NOTE — Op Note (Addendum)
DATE OF PROCEDURE: 10/02/16  PREOPERATIVE DIAGNOSES: Left renal mass  POSTOPERATIVE DIAGNOSES: Same as above  PROCEDURE PERFORMED: Left robotic nephrectomy  ATTENDING SURGEON: Sherlynn Stalls, MD   Assistant: Dr. Phebe Colla  ANESTHESIA: General anesthesia.  ESTIMATED BLOOD LOSS: 400 cc mL.   DRAINS: JP drain, 16 French Foley catheter.   COMPLICATIONS: Avulsion of renal artery requiring vascular repair of aorta   SPECIMENS: Left kidney  INDICATION: 66 year old female with a large invasive appearing renal mass measuring 6.1 cm with what appears to be direct extension into the adrenal gland measuring 3.9 cm.  She has multiple medical comorbidities and was optimized preoperatively with endocrinology and cardiology evaluations.   Risk and benefits of the procedure were explained in detail to the patient who agreed to proceed as planned.   PROCEDURE: The patient was correctly identified in the preoperative holding area and informed consent was confirmed. She was brought to the operating suite and placed on the table in the supine position. At this time, universal timeout protocol performed. All team members were identified. Venodyne boots were placed and she was administered IV Clindamycin (history of MRSA skin infection) in the perioperative period. An 42 French Foley catheter was then placed using standard sterile technique. She was then repositioned in the lateral decubitus position with the left flank up and all pressure points were carefully padded. An axillary roll was placed using a gel bolster just below the axilla and she was secured to the table using straps, gel pads, and tape. She was then prepped and draped in standard surgical fashion. At this point in time, a Veress needle was used in the left upper quadrant to insufflate the abdominal cavity. There was negative saline drop test without any bloody aspirate and she did have relatively low opening pressures of 4 mmHg.  The abodmen insufflated nicely and it was well tolerated. At this point, all port sites were carefully planned and mapped out across her left hemi-abdomen; marcaine was used in all of the laparoscopic port sites. The port sites were as follows: Two 12 mm assistant ports was placed in the anatomic midline in the upper quadrant spaced approximately 8 cm apart from one another, an additional 12 mm midabdominal port site was placed and used for a camera port using a bariatric 12 mm trocar, an 8 mm robotic port was placed in the left upper quadrant in the mammary line just below the costal margin, 2 additional 8 mm robotic port sites were placed in the left lateral lower abdomen and left lower mid abdomen as used for additional robotic port sites. The initial trocar was placed using a 5 mm Visiport, which was later converted to an 8 mm robotic arm. There was absolutely no injury noted from the Veress needle or trocar sites. The robot was then brought in and docked using bipolar [place] in the left, scissors on the right with a double fenestrated grasper in the 4th arm.   The white line of Toldt was incised and the left hemicolon was reflected medially. There was a nice plane here and Gerota fascia could be easily identified. The ureter and gonadal vein were then identified in the lower portion. The tail of Gerota was then traced superiorly towards the hilum.  The gonadal vein was dissected out and eventually ligated using a Weck clips x 2 and transected.   on this dissection, appeared lower pole artery and vein were encountered and controlled and ligated using 2 Weck clips as well as a  vascular load staple.  The splenorenal ligament was then incised and the spleen was mobilized superiorly as well. A plane was then created posterior to the kidney, and the kidney was retracted superiorly to place the hilum on stretch. The ureter was also identified and retracted superiorly with the kidney at this point in time.   Throughout the dissection, hemostasis was meticulous and any bleeding vessels, areas were either clipped using Weck clips or controlled using bipolar energy. Upon inspection of the hilar area, difficult anatomy was appreciated. There was a single renal vein which is able to be circumferentially isolated and a vessel loop placed around this structure. The lower renal pole artery was identified and during this dissection, unfortunately there was an avulsion of the artery with sudden severe bleeding. This was able to be controlled temporarily by placing a lap over the defect and laparoscopic pressure over the site. At this point in time, careful planning was performed and 2 units of blood were brought into the room. The patient remained hemodynamically stable. Eventually, we were able to control the aortic laceration by oversewing the defect using a 4-0 Prolene on an RB1 needle with a Lapra-Ty on the end. Once the defect was completely oversewn, a second Lapra-Ty was placed in the bleeding completely ceased.  There appeared to be incredibly fibrotic, desmoplastic reaction of the medial aspect of the kidney and the hilar area involving the upper part of the hilum and adrenal gland. This made dissection in this area quite difficult and there was no true anatomic planes. There was some concern for direct invasion/extension of the mass into this location. Eventually, we are able to dissect a plane between the aorta and the mass and a second renal artery was identified.  A Weck was placed on this artery at the level of the aorta. Upon preparing to place a vascular staple across the this artery, this artery also avulsed. There was no bleeding noted at this point in time. Unfortunately, the stump was too short to place an additional Weck and there was no room on the proximal aspect place an additional clip on the side. We did consider placing a running horizontal mattress suture below the Weck but would likely cause more harm  than benefit. A such, this was left in place.   Initially, the remainder of the kidney was able to be completely freed. The ureter was ligated using 2 Weck clips and transected. Once the kidney was completely mobile, was placed in a large laparoscopic bag. The entire bed of the wound was then carefully reinspected including the aorta. Minimal to no bleeding was noted. The wound was then irrigated. Hemostatic agents including Surgicel over the aorta followed by the cell. Surgi-Flo was placed just under the splenorenal ligament for additional hemostasis.   A 19 French round Blake drain was placed in the left lower lateral port site and placed within the dependent pelvis as a drain.   the robot was then undocked. All laparoscopic ports removed under direct visualization no bleeding was noted. The 12 mm port site fascia was closed using a 0 Vicryl on a UR 6. Next, the 2 12 mm midline ports were connected by making an incision between the 2. The incision was carried down through the subcutaneous Slater's using Bovie electrocautery to open the abdomen. The specimen was then extracted from the site. The defect was then closed using a series of figure of 8 #1 PDS sutures. The subcutaneous layers were closed using 3-0 Vicryl. All  additional wounds were closed using staples. The patient was then cleaned and dried.  All wounds were instilled with copious amounts of local anesthetic. A compressing the midlineincision and 2x2's and Tegaderms were placed over the laparoscopic port sites and a drain sponge was placed around the drain which was secured with a drain stitch.  She was then reversed from anesthesia, taken the PACU in stable condition. She did receive 1 unit of packed red blood cells as a precaution.  There were no complications in this case. All needle counts and instrument counts as well as laps were correct at the end of the case.    ____________________________ Sherlynn Stalls, MD

## 2016-10-03 NOTE — Progress Notes (Signed)
1 Day Post-Op Subjective: Doing well this morning. No nausea or vomiting. Tolerating clears. Complaining of pain at the incision sites, otherwise no complaints.  Objective: Vital signs in last 24 hours: Temp:  [97.4 F (36.3 C)-98.9 F (37.2 C)] 98.9 F (37.2 C) (04/11 0543) Pulse Rate:  [49-82] 63 (04/11 0543) Resp:  [10-18] 16 (04/11 0543) BP: (96-151)/(42-90) 109/90 (04/11 0543) SpO2:  [96 %-100 %] 98 % (04/11 0543) Weight:  [206 lb 11.2 oz (93.8 kg)] 206 lb 11.2 oz (93.8 kg) (04/11 0333)  Intake/Output from previous day: 04/10 0701 - 04/11 0700 In: 3930 [I.V.:3630; Blood:300] Out: 1160 [Urine:650; Drains:110; Blood:400] Intake/Output this shift: No intake/output data recorded.  Physical Exam:  General: Alert and oriented. CV: RRR Lungs: Clear bilaterally. GI: Soft, Nondistended. Incisions: Clean and dry. Urine: Clear, Foley in place Extremities: Nontender, no erythema, no edema.  Lab Results:  Recent Labs  10/02/16 1447 10/03/16 0420  HGB 11.2* 10.1*  HCT 34.5* 31.3*          Recent Labs  10/03/16 0420  CREATININE 1.20*           Results for orders placed or performed during the hospital encounter of 10/02/16 (from the past 24 hour(s))  Glucose, capillary     Status: Abnormal   Collection Time: 10/02/16 10:45 AM  Result Value Ref Range   Glucose-Capillary 209 (H) 65 - 99 mg/dL  Type and screen [place]     Status: None (Preliminary result)   Collection Time: 10/02/16 11:02 AM  Result Value Ref Range   ABO/RH(D) O POS    Antibody Screen NEG    Sample Expiration 10/05/2016    Unit Number [id number]    Blood Component Type RBC, LR IRR    Unit division 00    Status of Unit ISSUED    Transfusion Status OK TO TRANSFUSE    Crossmatch Result Compatible    Unit Number [id number]    Blood Component Type RED CELLS,LR    Unit division 00    Status of Unit ALLOCATED    Transfusion Status OK TO TRANSFUSE    Crossmatch Result  Compatible   Prepare RBC (crossmatch)     Status: None   Collection Time: 10/02/16  2:15 PM  Result Value Ref Range   Order Confirmation ORDER PROCESSED BY BLOOD BANK   Blood gas, arterial     Status: Abnormal   Collection Time: 10/02/16  2:41 PM  Result Value Ref Range   FIO2 0.50    Delivery systems VENTILATOR    Mode PRESSURE CONTROL    VT 550 mL   LHR 12 resp/min   Peep/cpap 4.0 cm H20   Pressure support 10 cm H20   pH, Arterial 7.37 7.350 - 7.450   pCO2 arterial 42 32.0 - 48.0 mmHg   pO2, Arterial 156 (H) 83.0 - 108.0 mmHg   Bicarbonate 24.3 20.0 - 28.0 mmol/L   Acid-base deficit 1.0 0.0 - 2.0 mmol/L   O2 Saturation 99.3 %   Patient temperature 37.0    Collection site A-LINE    Sample type ARTERIAL DRAW    Allens test (pass/fail) PASS PASS  Hemoglobin and hematocrit, blood     Status: Abnormal   Collection Time: 10/02/16  2:47 PM  Result Value Ref Range   Hemoglobin 11.2 (L) 12.0 - 16.0 g/dL   HCT 34.5 (L) 35.0 - 47.0 %  Glucose, capillary     Status: Abnormal   Collection Time: 10/02/16  6:06  PM  Result Value Ref Range   Glucose-Capillary 373 (H) 65 - 99 mg/dL  Glucose, capillary     Status: Abnormal   Collection Time: 10/02/16  9:57 PM  Result Value Ref Range   Glucose-Capillary 322 (H) 65 - 99 mg/dL   Comment 1 Notify RN   CBC     Status: Abnormal   Collection Time: 10/03/16  4:20 AM  Result Value Ref Range   WBC 10.4 3.6 - 11.0 K/uL   RBC 3.89 3.80 - 5.20 MIL/uL   Hemoglobin 10.1 (L) 12.0 - 16.0 g/dL   HCT 31.3 (L) 35.0 - 47.0 %   MCV 80.4 80.0 - 100.0 fL   MCH 26.1 26.0 - 34.0 pg   MCHC 32.4 32.0 - 36.0 g/dL   RDW 15.4 (H) 11.5 - 14.5 %   Platelets 312 150 - 440 K/uL  Basic metabolic panel     Status: Abnormal   Collection Time: 10/03/16  4:20 AM  Result Value Ref Range   Sodium 136 135 - 145 mmol/L   Potassium 4.3 3.5 - 5.1 mmol/L   Chloride 104 101 - 111 mmol/L   CO2 25 22 - 32 mmol/L   Glucose, Bld 287 (H) 65 - 99 mg/dL   BUN 23 (H) 6 - 20 mg/dL    Creatinine, Ser 1.20 (H) 0.44 - 1.00 mg/dL   Calcium 8.2 (L) 8.9 - 10.3 mg/dL   GFR calc non Af Amer 46 (L) >60 mL/min   GFR calc Af Amer 53 (L) >60 mL/min   Anion gap 7 5 - 15    Assessment/Plan: POD# 1 s/p robotic left nephrectomy.  Surgery, located by avulsion of renal artery, requiring aortic primary repair.  1. Left renal mass- POD 1 s/p nephrectomy. Advance diet at lunchtime.  Maintain bed rest until tomorrow.  Continue SCDs and aspirin, we will continue to hold subcutaneous heparin until tomorrow as long as hemoglobin remains stable.  2. Acute blood loss anemia- slight drop in hemoglobin this morning which is expected. We'll repeat her morning unless drain output picks up or becomes hemodynamically unstable.  3. Diabetes- currently on sliding scale. Medicine and diabetes coordinator consult for further management.  4. History of coronary artery disease- continue ASA 81, will continue to hold Plavix for now given concern for bleeding  5. Medical comorbidities other than above, continue home medications  6. Dispo planning- occult he is ambulating at baseline. We'll get PT involved starting tomorrow. Will likely need rehabilitation.   Hollice Espy, MD   LOS: 1 day   Hollice Espy 10/03/2016, 7:49 AM

## 2016-10-03 NOTE — Progress Notes (Signed)
[place] [facility] [facility] [place] NAME: Anita Small    MR#:  [id number]  DATE OF BIRTH:  02-17-1951  SUBJECTIVE: Follow-Up for Management of htn,dmii.as a History of Hypertension, Diabetes. 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 Complaints of Pain in the Operated Area but Denies Any Other Complaints and Has Very Poor by Mouth Intake and Does Not Want to Eat. According to Nurse Patient Is Having Some Carbonated Beverages.   CHIEF COMPLAINT:  No chief complaint on file.   REVIEW OF SYSTEMS:    Review of Systems  Constitutional: Negative for chills and fever.  HENT: Negative for hearing loss.   Eyes: Negative for blurred vision, double vision and photophobia.  Respiratory: Negative for cough, hemoptysis and shortness of breath.   Cardiovascular: Negative for palpitations, orthopnea and leg swelling.  Gastrointestinal: Negative for abdominal pain, diarrhea and vomiting.  Genitourinary: Positive for flank pain. Negative for dysuria and urgency.  Musculoskeletal: Negative for myalgias and neck pain.  Skin: Negative for rash.  Neurological: Negative for dizziness, focal weakness, seizures, weakness and headaches.  Psychiatric/Behavioral: Negative for memory loss. The patient does not have insomnia.     Nutrition:  Tolerating Diet: Tolerating PT:      DRUG ALLERGIES:   Allergies  Allergen Reactions  . Codeine Phosphate Other (See Comments)    REACTION: unspecified  . Duloxetine Other (See Comments)    REACTION: unspecified  . Meperidine Hcl Other (See Comments)    REACTION: unspecified  . Norco [Hydrocodone-Acetaminophen] Other (See Comments)    hallucinations  . Venlafaxine Other (See Comments)    REACTION: unspecified  . Doxycycline Hives and Rash    VITALS:  Blood pressure 125/61, pulse 65, temperature 99.5 F (37.5 C), temperature source Oral, resp. rate 20, height 5\' 1"  (1.549 m), weight  93.8 kg (206 lb 11.2 oz), SpO2 95 %.  PHYSICAL EXAMINATION:   Physical Exam  GENERAL:  66 y.o.-year-old patient lying in the bed with no acute distress.  EYES: Pupils equal, round, reactive to light and accommodation. No scleral icterus. Extraocular muscles intact.  HEENT: Head atraumatic, normocephalic. Oropharynx and nasopharynx clear.  NECK:  Supple, no jugular venous distention. No thyroid enlargement, no tenderness.  LUNGS: Normal breath sounds bilaterally, no wheezing, rales,rhonchi or crepitation. No use of accessory muscles of respiration.  CARDIOVASCULAR: S1, S2 normal. No murmurs, rubs, or gallops.  ABDOMEN: Soft, nontender, nondistended. Bowel sounds present.. Dressing present in the abdomen. Soft, nontender. EXTREMITIES: No pedal edema, cyanosis, or clubbing.  NEUROLOGIC: Cranial nerves II through XII are intact. Muscle strength 5/5 in all extremities. Sensation intact. Gait not checked.  PSYCHIATRIC: The patient is alert and oriented x 3.  SKIN: No obvious rash, lesion, or ulcer.    LABORATORY PANEL:   CBC  Recent Labs Lab 10/03/16 0420  WBC 10.4  HGB 10.1*  HCT 31.3*  PLT 312   ------------------------------------------------------------------------------------------------------------------  Chemistries   Recent Labs Lab 10/03/16 0420  NA 136  K 4.3  CL 104  CO2 25  GLUCOSE 287*  BUN 23*  CREATININE 1.20*  CALCIUM 8.2*   ------------------------------------------------------------------------------------------------------------------  Cardiac Enzymes No results for input(s): TROPONINI in the last 168 hours. ------------------------------------------------------------------------------------------------------------------  RADIOLOGY:  No results found.   ASSESSMENT AND PLAN:   Active Problems:   Left renal mass  Left renal mass status post robotic surgery by urology yesterday, continue bedrest  till tomorrow as per urology recommendation,  continue  SCDs, aspirin.  Hold subcutaneous heparin until tomorrow. #2 diabetes mellitus type 2: Seen by diabetic nurse, started on Lantus, continue sliding scale with coverage, measuring number is not ordered yet because patient to by mouth intake not very good. #3 history of CAD: Continue aspirin but hold the Plavix due to surgery. #4, physical therapy consult tomorrow.     All the records are reviewed and case discussed with Care Management/Social Workerr. Management plans discussed with the patient, family and they are in agreement.  CODE STATUS: full  TOTAL TIME TAKING CARE OF THIS PATIENT: 80minutes.   POSSIBLE D/C IN 1-2 DAYS, DEPENDING ON CLINICAL CONDITION.   Epifanio Lesches M.D on 10/03/2016 at 2:26 PM  Between 7am to 6pm - Pager - [phone]  After 6pm go to [email or web address] - password [email or web address] [facility]  [facility]  Office  [phone]  CC: Primary care physician; Leonel Ramsay, MD

## 2016-10-04 LAB — GLUCOSE, CAPILLARY
GLUCOSE-CAPILLARY: 215 mg/dL — AB (ref 65–99)
Glucose-Capillary: 145 mg/dL — ABNORMAL HIGH (ref 65–99)
Glucose-Capillary: 186 mg/dL — ABNORMAL HIGH (ref 65–99)
Glucose-Capillary: 237 mg/dL — ABNORMAL HIGH (ref 65–99)

## 2016-10-04 LAB — CBC
HEMATOCRIT: 30.5 % — AB (ref 35.0–47.0)
HEMOGLOBIN: 9.7 g/dL — AB (ref 12.0–16.0)
MCH: 25.3 pg — AB (ref 26.0–34.0)
MCHC: 31.8 g/dL — AB (ref 32.0–36.0)
MCV: 79.3 fL — AB (ref 80.0–100.0)
Platelets: 311 10*3/uL (ref 150–440)
RBC: 3.85 MIL/uL (ref 3.80–5.20)
RDW: 15.7 % — ABNORMAL HIGH (ref 11.5–14.5)
WBC: 9.3 10*3/uL (ref 3.6–11.0)

## 2016-10-04 LAB — BASIC METABOLIC PANEL
Anion gap: 6 (ref 5–15)
BUN: 12 mg/dL (ref 6–20)
CHLORIDE: 104 mmol/L (ref 101–111)
CO2: 26 mmol/L (ref 22–32)
Calcium: 8.1 mg/dL — ABNORMAL LOW (ref 8.9–10.3)
Creatinine, Ser: 0.82 mg/dL (ref 0.44–1.00)
GFR calc Af Amer: >60 mL/min (ref >60)
GFR calc non Af Amer: >60 mL/min (ref >60)
GLUCOSE: 147 mg/dL — AB (ref 65–99)
Potassium: 3.7 mmol/L (ref 3.5–5.1)
Sodium: 136 mmol/L (ref 135–145)

## 2016-10-04 MED ORDER — HEPARIN SODIUM (PORCINE) 5000 UNIT/ML IJ SOLN
5000.0000 [IU] | Freq: Three times a day (TID) | INTRAMUSCULAR | Status: DC
  Administered 2016-10-04 – 2016-10-05 (×4): 5000 [IU] via SUBCUTANEOUS
  Filled 2016-10-04 (×6): qty 1

## 2016-10-04 NOTE — Progress Notes (Signed)
Inpatient Diabetes Program Recommendations  AACE/ADA: New Consensus Statement on Inpatient Glycemic Control (2015)  Target Ranges:  Prepandial:   less than 140 mg/dL      [place]ak postprandial:   less than 180 mg/dL (1-2 hours)      Critically ill patients:  140 - 180 mg/dL   Lab Results  Component Value Date   GLUCAP 145 (H) 10/04/2016   HGBA1C 6.7 (H) 08/13/2015    Review of Glycemic Control  Results for Anita Small, Anita Small (MRN [id number]) as of 10/04/2016 09:34  Ref. Range 10/03/2016 08:15 10/03/2016 11:40 10/03/2016 16:27 10/03/2016 21:44 10/04/2016 07:53  Glucose-Capillary Latest Ref Range: 65 - 99 mg/dL 229 (H) 245 (H) 170 (H) 139 (H) 145 (H)    Diabetes history: DM2 Outpatient Diabetes medications: Per Care Everywhere, patient should be taking Lantus 24 units daily, Humalog 10 units with meals plus 0-10 units for correction TID  Current orders for Inpatient glycemic control: Novolog 0-15 units TID with meals, Novolog 0-5 units QHS, Lantus 20 units qday  Inpatient Diabetes Program Recommendations:    Please consider ordering Novolog 5 units TID with meals for meal coverage if patient is eating at least 50% of meals.   Per Care Everywhere, A1C was >14% on 09/11/16.   Gentry Fitz, RN, BA, MHA, CDE Diabetes Coordinator Inpatient Diabetes Program  [phone] (Team Pager) [phone] ([place]) 10/04/2016 9:37 AM

## 2016-10-04 NOTE — Progress Notes (Signed)
2 Days Post-Op Subjective: Passing flatus. Tolerating regular diet. Foley being removed this morning. Incisional pain, otherwise no complaints.  Objective: Vital signs in last 24 hours: Temp:  [98.7 F (37.1 C)-99.5 F (37.5 C)] 98.7 F (37.1 C) (04/12 0900) Pulse Rate:  [65-82] 82 (04/12 0900) Resp:  [17-20] 17 (04/12 0900) BP: (119-166)/(55-76) 147/75 (04/12 0900) SpO2:  [95 %-99 %] 96 % (04/12 0900)  Intake/Output from previous day: 04/11 0701 - 04/12 0700 In: 2393.7 [P.O.:360; I.V.:2033.7] Out: 1960 [Urine:1900; Drains:60] Intake/Output this shift: No intake/output data recorded.  Physical Exam:  General: Alert and oriented. CV: RRR Lungs: Clear bilaterally. GI: Soft, Nondistended. Incisions: Clean and dry.  Dressings removed. Urine: Clear, Foley in place Extremities: Nontender, no erythema, no edema.  Lab Results:  Recent Labs  10/02/16 1447 10/03/16 0420 10/04/16 0446  HGB 11.2* 10.1* 9.7*  HCT 34.5* 31.3* 30.5*           Recent Labs  10/03/16 0420 10/04/16 0446  CREATININE 1.20* 0.82           Results for orders placed or performed during the hospital encounter of 10/02/16 (from the past 24 hour(s))  Glucose, capillary     Status: Abnormal   Collection Time: 10/03/16 11:40 AM  Result Value Ref Range   Glucose-Capillary 245 (H) 65 - 99 mg/dL  Glucose, capillary     Status: Abnormal   Collection Time: 10/03/16  4:27 PM  Result Value Ref Range   Glucose-Capillary 170 (H) 65 - 99 mg/dL  Glucose, capillary     Status: Abnormal   Collection Time: 10/03/16  9:44 PM  Result Value Ref Range   Glucose-Capillary 139 (H) 65 - 99 mg/dL  CBC     Status: Abnormal   Collection Time: 10/04/16  4:46 AM  Result Value Ref Range   WBC 9.3 3.6 - 11.0 K/uL   RBC 3.85 3.80 - 5.20 MIL/uL   Hemoglobin 9.7 (L) 12.0 - 16.0 g/dL   HCT 30.5 (L) 35.0 - 47.0 %   MCV 79.3 (L) 80.0 - 100.0 fL   MCH 25.3 (L) 26.0 - 34.0 pg   MCHC 31.8 (L) 32.0 - 36.0 g/dL   RDW 15.7 (H)  11.5 - 14.5 %   Platelets 311 150 - 440 K/uL  Basic metabolic panel     Status: Abnormal   Collection Time: 10/04/16  4:46 AM  Result Value Ref Range   Sodium 136 135 - 145 mmol/L   Potassium 3.7 3.5 - 5.1 mmol/L   Chloride 104 101 - 111 mmol/L   CO2 26 22 - 32 mmol/L   Glucose, Bld 147 (H) 65 - 99 mg/dL   BUN 12 6 - 20 mg/dL   Creatinine, Ser 0.82 0.44 - 1.00 mg/dL   Calcium 8.1 (L) 8.9 - 10.3 mg/dL   GFR calc non Af Amer >60 >60 mL/min   GFR calc Af Amer >60 >60 mL/min   Anion gap 6 5 - 15  Glucose, capillary     Status: Abnormal   Collection Time: 10/04/16  7:53 AM  Result Value Ref Range   Glucose-Capillary 145 (H) 65 - 99 mg/dL    Assessment/Plan: POD# 2 s/p robotic left nephrectomy.  Surgery, located by avulsion of renal artery, requiring aortic primary repair.  1. Left renal mass- POD 2 s/p nephrectomy. Continue diabetic diet as tolerated.  Ambulate today.  Continue SCDs, ASA, and will start SQH today.  Continue to hold plavix.  2. Acute blood loss anemia- Hbg  stable.  Recheck in AM.  3. Diabetes- Appreciate medicine and diabetes coordinator consult for further management.  4. History of coronary artery disease- continue ASA 81, will continue to hold Plavix for now given concern for bleeding  5. Medical comorbidities other than above, continue home medications  6. Dispo planning- Difficulty ambulating at baseline. PT consult today. Will likely need rehabilitation.   Hollice Espy, MD   LOS: 2 days   Hollice Espy 10/04/2016, 9:38 AM

## 2016-10-04 NOTE — Plan of Care (Signed)
Problem: Activity: Goal: Mobility will improve Outcome: Not Applicable Date Met: 15/95/39 Pt on bedrest

## 2016-10-04 NOTE — Evaluation (Signed)
Physical Therapy Evaluation Patient Details Name: Anita Small MRN: [id number] DOB: [birth year] Today's Date: 10/04/2016   History of Present Illness  Pt is a 66 y.o. female s/p L sided nephrectomy for renal cell carcinoma (surgery located by avulsion of renal artery requiring aortic primary repair) 10/02/16; hospitalists consulted for uncontrolled diabetes.  PMH includes renal artery disease, DM, htn, HLD, OA, MI, CAD, c-spine sx, CEA, CABG, L IMN, L TKR.  Clinical Impression  Prior to hospital admission, pt was modified independent ambulating short distances with rollator.  Pt lives alone in 1 level home with multiple steps to enter.  Currently pt is CGA with transfers and ambulation 80 feet with RW; pt requiring multiple standing rest breaks during ambulation d/t generalized fatigue/weakness and incisional pain (7/10).  Pt would benefit from skilled PT to address noted impairments and functional limitations.  D/t pt's impaired activity tolerance and limited support at home, recommend pt discharge to STR when medically appropriate.    Follow Up Recommendations SNF    Equipment Recommendations  Rolling walker with 5" wheels    Recommendations for Other Services       Precautions / Restrictions Precautions Precautions: Fall Precaution Comments: JP drain Restrictions Weight Bearing Restrictions: No      Mobility  Bed Mobility               General bed mobility comments: Deferred d/t pt already sitting up in chair upon PT arrival and pt sitting in chair end of session.  Transfers Overall transfer level: Needs assistance Equipment used: Rolling walker (2 wheeled) Transfers: Sit to/from Stand Sit to Stand: Min guard         General transfer comment: increased effort to stand and control sitting; steady with RW  Ambulation/Gait Ambulation/Gait assistance: Min guard Ambulation Distance (Feet): 80 Feet Assistive device: Rolling walker (2 wheeled)   Gait velocity:  decreased   General Gait Details: decreased B step length; intermittent standing breaks d/t fatigue and pain  Stairs Stairs:  (Deferred d/t pt fatigue and pain)          Wheelchair Mobility    Modified Rankin (Stroke Patients Only)       Balance Overall balance assessment: Needs assistance Sitting-balance support: No upper extremity supported;Feet supported Sitting balance-Leahy Scale: Good Sitting balance - Comments: dynamic reaching within BOS   Standing balance support: Bilateral upper extremity supported (on RW) Standing balance-Leahy Scale: Fair Standing balance comment: static standing                             Pertinent Vitals/Pain Pain Assessment: 0-10 Pain Score: 7  Pain Location: Incisional pain Pain Descriptors / Indicators: Operative site guarding;Sharp;Discomfort;Sore Pain Intervention(s): Limited activity within patient's tolerance;Monitored during session;Premedicated before session;Repositioned  Pt's HR 59-60 bpm after ambulation (nursing notified); O2 sats >96% on room air.    Home Living Family/patient expects to be discharged to:: Private residence Living Arrangements: Alone Available Help at Discharge: Family;Friend(s) Type of Home: Mobile home Home Access: Stairs to enter Entrance Stairs-Rails: Right;Left;Can reach both Entrance Stairs-Number of Steps: 8 Home Layout: One level Home Equipment: Crutches;Walker - 4 wheels      Prior Function Level of Independence: Independent with assistive device(s)         Comments: Pt ambulating household distances with rollator.  Pt denies any falls in past 6 months.     Hand Dominance        Extremity/Trunk Assessment   Upper  Extremity Assessment Upper Extremity Assessment: Generalized weakness    Lower Extremity Assessment Lower Extremity Assessment: Generalized weakness       Communication   Communication: No difficulties  Cognition Arousal/Alertness:  Awake/alert Behavior During Therapy: WFL for tasks assessed/performed Overall Cognitive Status: Within Functional Limits for tasks assessed                                        General Comments General comments (skin integrity, edema, etc.): Drainage noted in incisional dressing.  Nursing cleared pt for participation in physical therapy.  Pt agreeable to PT session.    Exercises     Assessment/Plan    PT Assessment Patient needs continued PT services  PT Problem List Decreased strength;Decreased activity tolerance;Decreased mobility;Pain       PT Treatment Interventions DME instruction;Gait training;Stair training;Functional mobility training;Therapeutic activities;Therapeutic exercise;Patient/family education    PT Goals (Current goals can be found in the Care Plan section)  Acute Rehab PT Goals Patient Stated Goal: to get stronger PT Goal Formulation: With patient Time For Goal Achievement: 10/18/16 Potential to Achieve Goals: Good    Frequency Min 2X/week   Barriers to discharge Decreased caregiver support      Co-evaluation               End of Session Equipment Utilized During Treatment: Gait belt Activity Tolerance: Patient limited by fatigue;Patient limited by pain Patient left: in chair;with call bell/phone within reach;with chair alarm set;with nursing/sitter in room;with family/visitor present Nurse Communication: Mobility status;Precautions (Pt's HR end of session (59-60 bpm) and pt's pain levels) PT Visit Diagnosis: Muscle weakness (generalized) (M62.81);Difficulty in walking, not elsewhere classified (R26.2)    Time: 7867-5449 PT Time Calculation (min) (ACUTE ONLY): 27 min   Charges:   PT Evaluation $PT Eval Low Complexity: 1 Procedure     PT G CodesLeitha Bleak, PT 10/04/16, 1:13 PM [phone]

## 2016-10-04 NOTE — Progress Notes (Signed)
[facility] [place] NAME: Anita Small    MR#:  [id number]  DATE OF BIRTH:  16-Aug-1950  SUBJECTIVE: Follow-Up for Management of htn,dmii.as a History of Hypertension, Diabetes. 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 Patient not eating much and says that she has no appetite for like months. Does not want to eat. Physical therapy recommended skilled nursing.Marland Kitchen   CHIEF COMPLAINT:  No chief complaint on file.   REVIEW OF SYSTEMS:    Review of Systems  Constitutional: Negative for chills and fever.  HENT: Negative for hearing loss.   Eyes: Negative for blurred vision, double vision and photophobia.  Respiratory: Negative for cough, hemoptysis and shortness of breath.   Cardiovascular: Negative for palpitations, orthopnea and leg swelling.  Gastrointestinal: Negative for abdominal pain, diarrhea and vomiting.  Genitourinary: Positive for flank pain. Negative for dysuria and urgency.  Musculoskeletal: Negative for myalgias and neck pain.  Skin: Negative for rash.  Neurological: Negative for dizziness, focal weakness, seizures, weakness and headaches.  Psychiatric/Behavioral: Negative for memory loss. The patient does not have insomnia.     Nutrition:  Tolerating Diet: Tolerating PT:      DRUG ALLERGIES:   Allergies  Allergen Reactions  . Codeine Phosphate Other (See Comments)    REACTION: unspecified  . Duloxetine Other (See Comments)    REACTION: unspecified  . Meperidine Hcl Other (See Comments)    REACTION: unspecified  . Norco [Hydrocodone-Acetaminophen] Other (See Comments)    hallucinations  . Venlafaxine Other (See Comments)    REACTION: unspecified  . Doxycycline Hives and Rash    VITALS:  Blood pressure (!) 148/77, pulse 77, temperature 98 F (36.7 C), temperature source Oral, resp. rate 19, height 5\' 1"  (1.549 m), weight 93.8 kg (206 lb 11.2 oz), SpO2 97 %.  PHYSICAL  EXAMINATION:   Physical Exam  GENERAL:  66 y.o.-year-old patient lying in the bed with no acute distress.  EYES: Pupils equal, round, reactive to light and accommodation. No scleral icterus. Extraocular muscles intact.  HEENT: Head atraumatic, normocephalic. Oropharynx and nasopharynx clear.  NECK:  Supple, no jugular venous distention. No thyroid enlargement, no tenderness.  LUNGS: Normal breath sounds bilaterally, no wheezing, rales,rhonchi or crepitation. No use of accessory muscles of respiration.  CARDIOVASCULAR: S1, S2 normal. No murmurs, rubs, or gallops.  ABDOMEN: Soft, nontender, nondistended. Bowel sounds present.. Dressing present in the abdomen. Soft, nontender. EXTREMITIES: No pedal edema, cyanosis, or clubbing.  NEUROLOGIC: Cranial nerves II through XII are intact. Muscle strength 5/5 in all extremities. Sensation intact. Gait not checked.  PSYCHIATRIC: The patient is alert and oriented x 3.  SKIN: No obvious rash, lesion, or ulcer.    LABORATORY PANEL:   CBC  Recent Labs Lab 10/04/16 0446  WBC 9.3  HGB 9.7*  HCT 30.5*  PLT 311   ------------------------------------------------------------------------------------------------------------------  Chemistries   Recent Labs Lab 10/04/16 0446  NA 136  K 3.7  CL 104  CO2 26  GLUCOSE 147*  BUN 12  CREATININE 0.82  CALCIUM 8.1*   ------------------------------------------------------------------------------------------------------------------  Cardiac Enzymes No results for input(s): TROPONINI in the last 168 hours. ------------------------------------------------------------------------------------------------------------------  RADIOLOGY:  No results found.   ASSESSMENT AND PLAN:   Active Problems:   Left renal mass  Left renal mass status post robotic surgery by urology yesterday, continue bedrest  till tomorrow as per urology recommendation, continue  SCDs, aspirin.  #2 diabetes mellitus type 2:  Seen by diabetic  nurse, started on Lantus, continue sliding scale with coverage, Continue coverage only. Because by mouth intake is poor so I did not order NovoLog with meal coverage . #3 history of CAD: Continue aspirin but hold the Plavix due to surgery.  resume Plavix when okay with urology. #4, physical therapy , and skilled nursing.     All the records are reviewed and case discussed with Care Management/Social Workerr. Management plans discussed with the patient, family and they are in agreement.  CODE STATUS: full  TOTAL TIME TAKING CARE OF THIS PATIENT: 64minutes.   POSSIBLE D/C IN 1-2 DAYS, DEPENDING ON CLINICAL CONDITION.   Epifanio Lesches M.D on 10/04/2016 at 1:58 PM  Between 7am to 6pm - Pager - [phone]  After 6pm go to [email or web address] - password [email or web address]  Office  [phone]  CC: Primary care physician; Leonel Ramsay, MD

## 2016-10-05 LAB — HEMOGLOBIN AND HEMATOCRIT, BLOOD
HCT: 31.4 % — ABNORMAL LOW (ref 35.0–47.0)
Hemoglobin: 10.5 g/dL — ABNORMAL LOW (ref 12.0–16.0)

## 2016-10-05 LAB — SURGICAL PATHOLOGY

## 2016-10-05 LAB — GLUCOSE, CAPILLARY
GLUCOSE-CAPILLARY: 150 mg/dL — AB (ref 65–99)
GLUCOSE-CAPILLARY: 256 mg/dL — AB (ref 65–99)

## 2016-10-05 MED ORDER — DOCUSATE SODIUM 100 MG PO CAPS: 100.0000 mg | ORAL_CAPSULE | Freq: Two times a day (BID) | ORAL | 0 refills | Status: DC

## 2016-10-05 MED ORDER — OXYCODONE HCL 5 MG PO TABS: 5.0000 mg | ORAL_TABLET | ORAL | 0 refills | Status: DC | PRN

## 2016-10-05 NOTE — Care Management (Signed)
Patient admitted post Left robotic nephrectomy.  Patient's mobility reassessed by nursing staff.  Patient has been able to ambulate around the nursing staff.  Patient has a Rollator at home for ambulation.  Reported no PT services indicated at discharge.  RNCM signing off

## 2016-10-05 NOTE — Care Management Important Message (Signed)
Important Message  Patient Details  Name: Anita Small MRN: [id number] Date of Birth: April 21, 1951   Medicare Important Message Given:  Yes    Beverly Sessions, RN 10/05/2016, 12:36 PM

## 2016-10-05 NOTE — Progress Notes (Signed)
3 Days Post-Op Subjective: Voiding well. Continues to pass flatus. Desires to have a bowel movement. Ambulated with assistance of PT, recommending rehabilitation.  No nausea or vomiting. Hemodynamically stable.  Objective: Vital signs in last 24 hours: Temp:  [98 F (36.7 C)-98.3 F (36.8 C)] 98.3 F (36.8 C) (04/13 0455) Pulse Rate:  [57-77] 57 (04/13 0455) Resp:  [18-20] 18 (04/13 0455) BP: (138-153)/(48-77) 148/48 (04/13 0455) SpO2:  [97 %-99 %] 97 % (04/13 0455)  Intake/Output from previous day: 04/12 0701 - 04/13 0700 In: 2323.3 [P.O.:600; I.V.:1723.3] Out: 180 [Urine:125; Drains:55] Intake/Output this shift: No intake/output data recorded.  Physical Exam:  General: Alert and oriented. CV: RRR Lungs: Clear bilaterally. GI: Soft, Nondistended. Incisions: Clean and dry, staples in place.  JP with scant serous fluid. Extremities: Nontender, no erythema, no edema.  Lab Results:  Recent Labs  10/03/16 0420 10/04/16 0446 10/05/16 0615  HGB 10.1* 9.7* 10.5*  HCT 31.3* 30.5* 31.4*           Recent Labs  10/03/16 0420 10/04/16 0446  CREATININE 1.20* 0.82           Results for orders placed or performed during the hospital encounter of 10/02/16 (from the past 24 hour(s))  Glucose, capillary     Status: Abnormal   Collection Time: 10/04/16 11:40 AM  Result Value Ref Range   Glucose-Capillary 186 (H) 65 - 99 mg/dL  Glucose, capillary     Status: Abnormal   Collection Time: 10/04/16  5:01 PM  Result Value Ref Range   Glucose-Capillary 237 (H) 65 - 99 mg/dL  Glucose, capillary     Status: Abnormal   Collection Time: 10/04/16  8:51 PM  Result Value Ref Range   Glucose-Capillary 215 (H) 65 - 99 mg/dL   Comment 1 Notify RN   Hemoglobin and hematocrit, blood     Status: Abnormal   Collection Time: 10/05/16  6:15 AM  Result Value Ref Range   Hemoglobin 10.5 (L) 12.0 - 16.0 g/dL   HCT 31.4 (L) 35.0 - 47.0 %  Glucose, capillary     Status: Abnormal   Collection  Time: 10/05/16  7:49 AM  Result Value Ref Range   Glucose-Capillary 150 (H) 65 - 99 mg/dL    Assessment/Plan: POD# 3 s/p robotic left nephrectomy.  Surgery, located by avulsion of renal artery, requiring aortic primary repair.  1. Left renal mass- POD 3 s/p nephrectomy. Continue diabetic diet as tolerated.  Ambulate today.  Continue SCDs, ASA, and SQH.  Continue to hold plavix.  2. Acute blood loss anemia- Hbg stable.   3. Diabetes- Appreciate medicine and diabetes coordinator consult for further management.  4. History of coronary artery disease- continue ASA 81, will continue to hold Plavix for now given concern for bleeding  5. Medical comorbidities other than above, continue home medications  6. Dispo planning- appropriate for discharge today. Awaiting placement.   Hollice Espy, MD   LOS: 3 days   Hollice Espy 10/05/2016, 9:03 AM

## 2016-10-05 NOTE — Progress Notes (Signed)
Inpatient Diabetes Program Recommendations  AACE/ADA: New Consensus Statement on Inpatient Glycemic Control (2015)  Target Ranges:  Prepandial:   less than 140 mg/dL      Peak postprandial:   less than 180 mg/dL (1-2 hours)      Critically ill patients:  140 - 180 mg/dL   Lab Results  Component Value Date   GLUCAP 256 (H) 10/05/2016   HGBA1C 6.7 (H) 08/13/2015    Review of Glycemic Control  Results for Anita Small, Anita Small (MRN [id number]) as of 10/05/2016 12:05  Ref. Range 10/04/2016 11:40 10/04/2016 17:01 10/04/2016 20:51 10/05/2016 07:49 10/05/2016 11:59  Glucose-Capillary Latest Ref Range: 65 - 99 mg/dL 186 (H) 237 (H) 215 (H) 150 (H) 256 (H)   Diabetes history:DM2 Outpatient Diabetes medications: Per Care Everywhere, patient should be taking Lantus 24 units daily, Humalog 10 units with meals plus 0-10 units for correction TID  Current orders for Inpatient glycemic control: Novolog 0-15 units TID with meals, Novolog 0-5 units QHS, Lantus 20 units qday  Inpatient Diabetes Program Recommendations:   Please consider ordering Novolog 5 units TID with meals for meal coverage if patient is eating at least 50% of meals while inpatient.   Per Care Everywhere, A1C was >14% on 09/11/16.   Pre-admission medications appear to be ideal  For discharge from [facility] but the patient must take them as ordered.  Note from Dessa Phi, NP dated 09/30/16 in care everywhere, have the insulin instructions written as needed including the "sliding scale" insulin.  Gentry Fitz, RN, BA, MHA, CDE Diabetes Coordinator Inpatient Diabetes Program  [phone] (Team Pager) [phone] ([place]) 10/05/2016 12:11 PM

## 2016-10-05 NOTE — Discharge Summary (Signed)
Date of admission: 10/02/2016  Date of discharge: 10/05/2016  Admission diagnosis: Left renal mass  Discharge diagnosis: Left renal mass, diabetes, acute blood loss anemia  Secondary diagnoses:  Patient Active Problem List   Diagnosis Date Noted  . Left renal mass 10/02/2016  . Cellulitis 04/08/2016  . NSTEMI (non-ST elevated myocardial infarction) ([place])   . Fall 07/29/2015  . Fracture of left humerus 07/28/2015  . Femur fracture, left ([place]) 07/28/2015  . Elevated troponin 07/28/2015  . Routine general medical examination at a health care facility 09/19/2010  . HLD (hyperlipidemia) 09/19/2010  . CAROTID ARTERY STENOSIS 07/25/2009  . Type 2 diabetes mellitus ([place]) 11/01/2006  . ANXIETY 11/01/2006  . Hypothyroidism 10/31/2006  . OBESITY 10/31/2006  . Episodic mood disorder ([place]) 10/31/2006  . Essential hypertension 10/31/2006  . GERD 10/31/2006  . Osteoarthritis, multiple sites 10/31/2006    History and Physical: For full details, please see admission history and physical. Briefly, Anita Small is a 66 y.o. year old patient with Large left renal mass who underwent left robotic radical nephrectomy on 10/02/2016.  The H&P for details.  Hospital Course: Patient tolerated the procedure well.  She was then transferred to the floor after an uneventful PACU stay.  Her hospital course was uncomplicated. She remained on bedrest for 48 hours due to concern for bleeding although her hemoglobin remained stable. Diabetes coordinator and internal medicine work consult for assistance with management of her multiple other medical comorbidities as well as poorly controlled diabetes.  On POD#3 she had met discharge criteria: was eating a regular diet, was up and ambulating with assistance,  pain was well controlled, was voiding without a catheter, and was ready to for discharge.   Laboratory values:   Recent Labs  10/03/16 0420 10/04/16 0446 10/05/16 0615  WBC 10.4 9.3  --   HGB 10.1*  9.7* 10.5*  HCT 31.3* 30.5* 31.4*    Recent Labs  10/03/16 0420 10/04/16 0446  NA 136 136  K 4.3 3.7  CL 104 104  CO2 25 26  GLUCOSE 287* 147*  BUN 23* 12  CREATININE 1.20* 0.82  CALCIUM 8.2* 8.1*   No results for input(s): LABPT, INR in the last 72 hours. No results for input(s): LABURIN in the last 72 hours. Results for orders placed or performed during the hospital encounter of 10/02/16  Surgical pcr screen     Status: Abnormal   Collection Time: 09/05/16  1:35 PM  Result Value Ref Range Status   MRSA, PCR POSITIVE (A) NEGATIVE Final    Comment: RESULT CALLED TO, READ BACK BY AND VERIFIED WITH: HEATHER DAVIS 09/05/16 1650 KLW    Staphylococcus aureus POSITIVE (A) NEGATIVE Final    Comment:        The Xpert SA Assay (FDA approved for NASAL specimens in patients over 54 years of age), is one component of a comprehensive surveillance program.  Test performance has been validated by [facility] for patients greater than or equal to 24 year old. It is not intended to diagnose infection nor to guide or monitor treatment.   Urine culture     Status: Abnormal   Collection Time: 09/05/16  1:35 PM  Result Value Ref Range Status   Specimen Description URINE, CLEAN CATCH  Final   Special Requests NONE  Final   Culture MULTIPLE SPECIES PRESENT, SUGGEST RECOLLECTION (A)  Final   Report Status 09/07/2016 FINAL  Final    Disposition: Home  Discharge instruction:   Activity:  You  are encouraged to ambulate frequently (about every hour during waking hours) to help prevent blood clots from forming in your legs or lungs.  However, you should not engage in any heavy lifting (> 5-10 lbs), strenuous activity, or straining.   Diet: You should advance your diet as instructed by your physician.  It will be normal to have some bloating, nausea, and abdominal discomfort intermittently.   Prescriptions:  You will be provided a prescription for pain medication to take as needed.  If  your pain is not severe enough to require the prescription pain medication, you may take extra strength Tylenol instead which will have less side effects.  You should also take a prescribed stool softener to avoid straining with bowel movements as the prescription pain medication may constipate you.   Incisions: You may remove your dressing bandages 48 hours after surgery if not removed in the hospital.  You will either have some small staples or special tissue glue at each of the incision sites. Once the bandages are removed (if present), the incisions may stay open to air.  You may start showering (but not soaking or bathing in water) the 2nd day after surgery and the incisions simply need to be patted dry after the shower.  No additional care is needed.  What to call us about: You should call the office if you develop fever > 101 or develop persistent vomiting, redness or draining around your incision, or any other concerning symptoms.    [facility] [address] [place], [place] [address] [phone]   Please continue to hold your Plavix until you are seen postoperatively in the clinic. May continue your aspirin.   Discharge medications: Allergies as of 10/05/2016      Reactions   Codeine Phosphate Other (See Comments)   REACTION: unspecified   Duloxetine Other (See Comments)   REACTION: unspecified   Meperidine Hcl Other (See Comments)   REACTION: unspecified   Norco [hydrocodone-acetaminophen] Other (See Comments)   hallucinations   Venlafaxine Other (See Comments)   REACTION: unspecified   Doxycycline Hives, Rash      Medication List    TAKE these medications   aspirin EC 81 MG tablet Take 1 tablet (81 mg total) by mouth daily.   cetirizine 10 MG tablet Commonly known as:  ZYRTEC Take 10 mg by mouth as needed.   citalopram 40 MG tablet Commonly known as:  CELEXA Take 40 mg by mouth daily. In am.   docusate sodium 100 MG  capsule Commonly known as:  COLACE Take 1 capsule (100 mg total) by mouth 2 (two) times daily.   hydrochlorothiazide 25 MG tablet Commonly known as:  HYDRODIURIL Take 25 mg by mouth daily.   levothyroxine 100 MCG tablet Commonly known as:  SYNTHROID, LEVOTHROID Take 1 tablet (100 mcg total) by mouth daily.   metoprolol tartrate 25 MG tablet Commonly known as:  LOPRESSOR TAKE 1 TABLET TWICE DAILY What changed:  See the new instructions.   oxyCODONE 5 MG immediate release tablet Commonly known as:  Oxy IR/ROXICODONE Take 1 tablet (5 mg total) by mouth every 4 (four) hours as needed for moderate pain.       Followup:   Follow-up Information    Hollice Espy, MD In 1 week.   Specialty:  Urology Why:  staple removal/ pathology review Contact information: [address] [place]  [id number] [phone]

## 2016-10-05 NOTE — Progress Notes (Signed)
Patient discharged to home as ordered. Follow up appointments given as ordered, Prescription given to patient. Patient is alert and oriented no acute distress noted. Patient ambulates with a walker and patients sister states that she has a walker at home.

## 2016-10-12 ENCOUNTER — Encounter: Payer: Self-pay | Admitting: Urology

## 2016-10-12 ENCOUNTER — Ambulatory Visit (INDEPENDENT_AMBULATORY_CARE_PROVIDER_SITE_OTHER): Payer: Medicare HMO | Admitting: Urology

## 2016-10-12 VITALS — BP 132/74 | HR 64 | Ht 62.0 in | Wt 193.0 lb

## 2016-10-12 DIAGNOSIS — C642 Malignant neoplasm of left kidney, except renal pelvis: Secondary | ICD-10-CM

## 2016-10-12 MED ORDER — OXYCODONE HCL 5 MG PO TABS: 5.0000 mg | ORAL_TABLET | ORAL | 0 refills | Status: DC | PRN

## 2016-10-12 NOTE — Progress Notes (Signed)
10/12/2016 12:49 PM   Anita Small 07/12/1950 [id number]  Referring provider: Leonel Ramsay, MD [address] [place], [place] [address]  Chief Complaint  Patient presents with  . Routine Post Op    staple removal    HPI: 66 year old female who returns today following left robotic radical nephrectomy for routine postoperative follow-up.  Unfortunately, surgical pathology was consistent with high-grade urothelial carcinoma, T4 with a positive hilar margin. It appeared to be directly invading the adrenal gland.  Ureteral margin was negative.    Postoperatively, she is doing fine. She is ambulating and has incisional pain only. Her left flank pain has resolved. No gross hematuria. She continues to feel fatigued at baseline and has a poor appetite as prior to surgery.  She has been working hard to control her blood sugars.  She has had daily bowel movements. No nausea or vomiting.  Staples removed at bedside today and replaced with Benzoin and Steri-Strips.   PMH: Past Medical History:  Diagnosis Date  . Adenomatous polyp 1999  . Anxiety   . Arthritis    Left knee  . CAD (coronary artery disease) of artery bypass graft    a. NSTEMI in 07/2015: Cath showed 99% stenosis of distal LCx  --> DES placed  . Depression    Paranoid tendencies  . Diabetes mellitus    Type II  . Diverticulosis   . GERD (gastroesophageal reflux disease)   . Hyperlipidemia   . Hypertension   . Hypothyroidism   . Myocardial infarction ([place]) 2017  . Obesity     Surgical History: Past Surgical History:  Procedure Laterality Date  . ABDOMINAL HYSTERECTOMY  1977   One ovary left  . APPENDECTOMY    . BLADDER REPAIR  1977   Tacking  . CARDIAC CATHETERIZATION N/A 08/01/2015   Procedure: Left Heart Cath and Coronary Angiography;  Surgeon: Wellington Hampshire, MD;  Location: [place] [facility];  Service: Cardiovascular;  Laterality: N/A;  . CARDIAC CATHETERIZATION N/A 08/01/2015   Procedure: Coronary Stent Intervention;  Surgeon: Wellington Hampshire, MD;  Location: [place] [facility];  Service: Cardiovascular;  Laterality: N/A;  . CARDIOVASCULAR STRESS TEST  7/00   negative. No cardiolite  . CAROTID ENDARTERECTOMY  2/11   Dr Jamal Collin  . [place]  . CHOLECYSTECTOMY    . CORONARY ARTERY BYPASS GRAFT     2017  . CT ABD W & PELVIS WO CM  2002   Negative. Pituitary normal on MRI  . HEEL SPUR SURGERY  03/03   Left  . INTRAMEDULLARY (IM) NAIL INTERTROCHANTERIC Left 08/04/2015   Procedure: INTRAMEDULLARY (IM) NAIL INTERTROCHANTRIC;  Surgeon: Hessie Knows, MD;  Location: [facility];  Service: Orthopedics;  Laterality: Left;  . KNEE ARTHROSCOPY  2001   Left   Tamala Julian)  . ROBOT ASSISTED LAPAROSCOPIC NEPHRECTOMY Left 10/02/2016   Procedure: ROBOTIC ASSISTED LAPAROSCOPIC RADICAL NEPHRECTOMY;  Surgeon: Hollice Espy, MD;  Location: [facility];  Service: Urology;  Laterality: Left;  . TEE WITHOUT CARDIOVERSION N/A 04/11/2016   Procedure: TRANSESOPHAGEAL ECHOCARDIOGRAM (TEE);  Surgeon: Teodoro Spray, MD;  Location: [facility];  Service: Cardiovascular;  Laterality: N/A;  . TOTAL KNEE ARTHROPLASTY  11/12   left--Dr Tamala Julian  . TRANSTHORACIC ECHOCARDIOGRAM  3/04   normal    Home Medications:  Allergies as of 10/12/2016      Reactions   Codeine Phosphate Other (See Comments)   REACTION: unspecified   Duloxetine Other (See Comments)   REACTION:  unspecified   Meperidine Hcl Other (See Comments)   REACTION: unspecified   Norco [hydrocodone-acetaminophen] Other (See Comments)   hallucinations   Venlafaxine Other (See Comments)   REACTION: unspecified   Doxycycline Hives, Rash      Medication List       Accurate as of 10/12/16 11:59 PM. Always use your most recent med list.          aspirin EC 81 MG tablet Take 1 tablet (81 mg total) by mouth daily.   cetirizine 10 MG tablet Commonly known as:  ZYRTEC Take 10 mg by mouth as needed.   citalopram 40 MG  tablet Commonly known as:  CELEXA Take 40 mg by mouth daily. In am.   docusate sodium 100 MG capsule Commonly known as:  COLACE Take 1 capsule (100 mg total) by mouth 2 (two) times daily.   hydrochlorothiazide 25 MG tablet Commonly known as:  HYDRODIURIL Take 25 mg by mouth daily.   levothyroxine 100 MCG tablet Commonly known as:  SYNTHROID, LEVOTHROID Take 1 tablet (100 mcg total) by mouth daily.   metoprolol tartrate 25 MG tablet Commonly known as:  LOPRESSOR TAKE 1 TABLET TWICE DAILY   oxyCODONE 5 MG immediate release tablet Commonly known as:  Oxy IR/ROXICODONE Take 1 tablet (5 mg total) by mouth every 4 (four) hours as needed for moderate pain.       Allergies:  Allergies  Allergen Reactions  . Codeine Phosphate Other (See Comments)    REACTION: unspecified  . Duloxetine Other (See Comments)    REACTION: unspecified  . Meperidine Hcl Other (See Comments)    REACTION: unspecified  . Norco [Hydrocodone-Acetaminophen] Other (See Comments)    hallucinations  . Venlafaxine Other (See Comments)    REACTION: unspecified  . Doxycycline Hives and Rash    Family History: Family History  Problem Relation Age of Onset  . Cancer Mother     Breast  . Heart disease Mother   . Diabetes Mother   . Colon polyps Mother   . Heart disease Father   . Diabetes Father   . Hypertension Sister   . Diabetes Sister   . Depression Daughter   . Cancer Paternal Grandmother     ? throat CA  . Colon cancer      Unsure if mother had it   . Bladder Cancer Neg Hx   . Kidney cancer Neg Hx     Social History:  reports that she quit smoking about 30 years ago. Her smoking use included Cigarettes. She has never used smokeless tobacco. She reports that she does not drink alcohol or use drugs.   Physical Exam: BP 132/74   Pulse 64   Ht 5\' 2"  (1.575 m)   Wt 193 lb (87.5 kg)   BMI 35.30 kg/m   Constitutional:  Alert and oriented, No acute distress.  Ambulating with walker.  Accompanied today by her sister. HEENT: [place] AT, moist mucus membranes.  Trachea midline, no masses. Cardiovascular: No clubbing, cyanosis, or edema. Respiratory: Normal respiratory effort, no increased work of breathing. GI: Abdomen is soft, nontender, nondistended, obese. Wounds clean dry and intact with staples in place. Slight separation of midline incision (epithelial layer only) at the apex without signs of dehiscence GU: No CVA tenderness.  Skin: No rashes, bruises or suspicious lesions. Neurologic: Grossly intact, no focal deficits, moving all 4 extremities. Psychiatric: Normal mood and affect.  Laboratory Data: Lab Results  Component Value Date   WBC 9.3 10/04/2016  HGB 10.5 (L) 10/05/2016   HCT 31.4 (L) 10/05/2016   MCV 79.3 (L) 10/04/2016   PLT 311 10/04/2016    Lab Results  Component Value Date   CREATININE 0.82 10/04/2016    Lab Results  Component Value Date   HGBA1C 6.7 (H) 08/13/2015    Urinalysis n/a   Pertinent Imaging: Previous CT from 08/19/16 and CXR reviewed again today  Assessment & Plan:    1. 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  Hollice Espy, MD  [facility] [address], [place], [place] [address] [phone]

## 2016-10-19 ENCOUNTER — Inpatient Hospital Stay: Payer: Medicare HMO | Attending: Oncology | Admitting: Oncology

## 2016-10-19 ENCOUNTER — Inpatient Hospital Stay: Payer: Medicare HMO

## 2016-10-19 ENCOUNTER — Encounter: Payer: Self-pay | Admitting: Oncology

## 2016-10-19 VITALS — BP 105/71 | HR 55 | Temp 97.5°F | Resp 18 | Ht 62.0 in | Wt 186.1 lb

## 2016-10-19 DIAGNOSIS — C642 Malignant neoplasm of left kidney, except renal pelvis: Secondary | ICD-10-CM | POA: Diagnosis not present

## 2016-10-19 DIAGNOSIS — F329 Major depressive disorder, single episode, unspecified: Secondary | ICD-10-CM | POA: Diagnosis not present

## 2016-10-19 DIAGNOSIS — E785 Hyperlipidemia, unspecified: Secondary | ICD-10-CM | POA: Insufficient documentation

## 2016-10-19 DIAGNOSIS — Z7982 Long term (current) use of aspirin: Secondary | ICD-10-CM | POA: Insufficient documentation

## 2016-10-19 DIAGNOSIS — E039 Hypothyroidism, unspecified: Secondary | ICD-10-CM | POA: Insufficient documentation

## 2016-10-19 DIAGNOSIS — K219 Gastro-esophageal reflux disease without esophagitis: Secondary | ICD-10-CM | POA: Diagnosis not present

## 2016-10-19 DIAGNOSIS — M1990 Unspecified osteoarthritis, unspecified site: Secondary | ICD-10-CM | POA: Insufficient documentation

## 2016-10-19 DIAGNOSIS — E119 Type 2 diabetes mellitus without complications: Secondary | ICD-10-CM | POA: Diagnosis not present

## 2016-10-19 DIAGNOSIS — Z955 Presence of coronary angioplasty implant and graft: Secondary | ICD-10-CM | POA: Insufficient documentation

## 2016-10-19 DIAGNOSIS — Z905 Acquired absence of kidney: Secondary | ICD-10-CM | POA: Insufficient documentation

## 2016-10-19 DIAGNOSIS — Z79899 Other long term (current) drug therapy: Secondary | ICD-10-CM | POA: Insufficient documentation

## 2016-10-19 DIAGNOSIS — E669 Obesity, unspecified: Secondary | ICD-10-CM | POA: Insufficient documentation

## 2016-10-19 DIAGNOSIS — I252 Old myocardial infarction: Secondary | ICD-10-CM | POA: Diagnosis not present

## 2016-10-19 DIAGNOSIS — F419 Anxiety disorder, unspecified: Secondary | ICD-10-CM | POA: Insufficient documentation

## 2016-10-19 DIAGNOSIS — Z7189 Other specified counseling: Secondary | ICD-10-CM

## 2016-10-19 DIAGNOSIS — I10 Essential (primary) hypertension: Secondary | ICD-10-CM | POA: Diagnosis not present

## 2016-10-19 DIAGNOSIS — C679 Malignant neoplasm of bladder, unspecified: Secondary | ICD-10-CM | POA: Diagnosis not present

## 2016-10-19 DIAGNOSIS — Z87891 Personal history of nicotine dependence: Secondary | ICD-10-CM | POA: Diagnosis not present

## 2016-10-19 DIAGNOSIS — I25810 Atherosclerosis of coronary artery bypass graft(s) without angina pectoris: Secondary | ICD-10-CM | POA: Diagnosis not present

## 2016-10-19 LAB — CBC WITH DIFFERENTIAL/PLATELET
Basophils Absolute: 0.3 10*3/uL — ABNORMAL HIGH (ref 0–0.1)
Basophils Relative: 3 %
EOS ABS: 0.3 10*3/uL (ref 0–0.7)
EOS PCT: 3 %
HCT: 36.9 % (ref 35.0–47.0)
Hemoglobin: 12.1 g/dL (ref 12.0–16.0)
LYMPHS ABS: 2.4 10*3/uL (ref 1.0–3.6)
Lymphocytes Relative: 28 %
MCH: 26.1 pg (ref 26.0–34.0)
MCHC: 32.8 g/dL (ref 32.0–36.0)
MCV: 79.7 fL — ABNORMAL LOW (ref 80.0–100.0)
Monocytes Absolute: 0.5 10*3/uL (ref 0.2–0.9)
Monocytes Relative: 5 %
Neutro Abs: 5.2 10*3/uL (ref 1.4–6.5)
Neutrophils Relative %: 61 %
PLATELETS: 457 10*3/uL — AB (ref 150–440)
RBC: 4.63 MIL/uL (ref 3.80–5.20)
RDW: 16.7 % — ABNORMAL HIGH (ref 11.5–14.5)
WBC: 8.6 10*3/uL (ref 3.6–11.0)

## 2016-10-19 LAB — COMPREHENSIVE METABOLIC PANEL
ALT: 8 U/L — ABNORMAL LOW (ref 14–54)
AST: 16 U/L (ref 15–41)
Albumin: 3.7 g/dL (ref 3.5–5.0)
Alkaline Phosphatase: 86 U/L (ref 38–126)
Anion gap: 10 (ref 5–15)
BUN: 20 mg/dL (ref 6–20)
CALCIUM: 9.1 mg/dL (ref 8.9–10.3)
CHLORIDE: 102 mmol/L (ref 101–111)
CO2: 24 mmol/L (ref 22–32)
CREATININE: 2.05 mg/dL — AB (ref 0.44–1.00)
GFR calc non Af Amer: 24 mL/min — ABNORMAL LOW (ref >60)
GFR, EST AFRICAN AMERICAN: 28 mL/min — AB (ref >60)
GLUCOSE: 147 mg/dL — AB (ref 65–99)
Potassium: 4.7 mmol/L (ref 3.5–5.1)
SODIUM: 136 mmol/L (ref 135–145)
Total Bilirubin: 0.7 mg/dL (ref 0.3–1.2)
Total Protein: 7.3 g/dL (ref 6.5–8.1)

## 2016-10-19 NOTE — Progress Notes (Signed)
Hematology/Oncology Consult note [facility] Telephone:([phone] [phone]  Patient Care Team: Leonel Ramsay, MD as PCP - General (Infectious Diseases)   Name of the patient: Anita Small  [id number]  08-04-1950    Reason for referral- locally advanced Upper urothelial carcinoma likely Stage IV   Referring physician- Dr. Hollice Espy  Date of visit: 10/19/16   History of presenting illness- 1. Patient is a 66 year old female who was seen by Dr. Erlene Quan in March 2018. In late February 2018 patient presented with acute onset left-sided flank pain and was found to have a large 6 x 6.6 x 6 1 cm mid to left upper pole renal mass on which was directly fading the left adrenal gland measuring 3.1 x 3.9 cm. There was no obvious renal vein invasion. No intra-abdominal adenopathy was noted and chest x-ray did not reveal any obvious pulmonary disease  2. Patient underwent left radical nephrectomy on 10/02/2016. During the time of surgery the mass appeared incredibly fibrotic, desmoplastic reaction was noted in the medial aspect of the kidney in the hilar area involving the upper part of the hilum and the adrenal gland. There was also concern for renal vein invasion. Distal ureterectomy and lymph node dissection was not performed thinking that this was possibly a renal cell carcinoma.  3. DIAGNOSIS:  A. LEFT KIDNEY; ROBOTIC-ASSISTED LAPAROSCOPIC RADICAL NEPHRECTOMY:  - UROTHELIAL CARCINOMA, HIGH-GRADE.  - LYMPHOVASCULAR AND PERINEURAL INVASION PRESENT.  - THE HILAR MARGIN IS POSITIVE.  - THE URETERAL AND GEROTA'S FASCIA MARGINS ARE NEGATIVE.  - SEE SUMMARY BELOW.   Surgical Pathology Cancer Case Summary   RENAL PELVIS AND URETER:  Procedure:  Robotic-assisted laparoscopic radical nephrectomy  Specimen Laterality: Left  Tumor Site: [place], upper and lower poles  Histologic Type: Urothelial carcinoma  Histologic Grade: High grade  Tumor Extension:  Tumor invades adjacent organs (adrenal gland) and  through the kidney into the perinephric fat  Margins: Hilar margin positive; ureteral and Gerota's fasciamargins  negative  Lymphovascular Invasion: Present  Regional Lymph Nodes: No lymph nodes submitted or found  Pathologic Stage Classification (pTNM, AJCC 8th Edition): pT4 pNX  TNM Descriptors: Not applicable  4. Discussed patient's case with Dr. Erlene Quan over the phone today. She does not think that repeat surgery for distal ureterecomy and LN dissection can be performed given patients performance status and comorbidities. Patient lives with her sister and has limited mobility around the house. She is still complaining of pain the surgical site. Appetite has not been good. And she has lost more than 15 pounds of weight in 1 month  ECOG PS- 2-3  Pain scale- 7   Review of systems- Review of Systems  Constitutional: Positive for malaise/fatigue.  Gastrointestinal: Positive for abdominal pain and nausea.  Neurological: Positive for weakness.    Allergies  Allergen Reactions  . Codeine Phosphate Other (See Comments)    REACTION: unspecified  . Duloxetine Other (See Comments)    REACTION: unspecified  . Meperidine Hcl Other (See Comments)    REACTION: unspecified  . Norco [Hydrocodone-Acetaminophen] Other (See Comments)    hallucinations  . Venlafaxine Other (See Comments)    REACTION: unspecified  . Doxycycline Hives and Rash    Patient Active Problem List   Diagnosis Date Noted  . Left renal mass 10/02/2016  . Cellulitis 04/08/2016  . NSTEMI (non-ST elevated myocardial infarction) ([place])   . Fall 07/29/2015  . Fracture of left humerus 07/28/2015  . Femur fracture, left ([place]) 07/28/2015  . Elevated  troponin 07/28/2015  . Routine general medical examination at a health care facility 09/19/2010  . HLD (hyperlipidemia) 09/19/2010  . CAROTID ARTERY STENOSIS 07/25/2009  . Type 2 diabetes mellitus ([place]) 11/01/2006  . ANXIETY  11/01/2006  . Hypothyroidism 10/31/2006  . OBESITY 10/31/2006  . Episodic mood disorder ([place]) 10/31/2006  . Essential hypertension 10/31/2006  . GERD 10/31/2006  . Osteoarthritis, multiple sites 10/31/2006     Past Medical History:  Diagnosis Date  . Adenomatous polyp 1999  . Anxiety   . Arthritis    Left knee  . CAD (coronary artery disease) of artery bypass graft    a. NSTEMI in 07/2015: Cath showed 99% stenosis of distal LCx  --> DES placed  . Depression    Paranoid tendencies  . Diabetes mellitus    Type II  . Diverticulosis   . GERD (gastroesophageal reflux disease)   . Hyperlipidemia   . Hypertension   . Hypothyroidism   . Myocardial infarction ([place]) 2017  . Obesity      Past Surgical History:  Procedure Laterality Date  . ABDOMINAL HYSTERECTOMY  1977   One ovary left  . APPENDECTOMY    . BLADDER REPAIR  1977   Tacking  . CARDIAC CATHETERIZATION N/A 08/01/2015   Procedure: Left Heart Cath and Coronary Angiography;  Surgeon: Wellington Hampshire, MD;  Location: [place] [facility];  Service: Cardiovascular;  Laterality: N/A;  . CARDIAC CATHETERIZATION N/A 08/01/2015   Procedure: Coronary Stent Intervention;  Surgeon: Wellington Hampshire, MD;  Location: [place] [facility];  Service: Cardiovascular;  Laterality: N/A;  . CARDIOVASCULAR STRESS TEST  7/00   negative. No cardiolite  . CAROTID ENDARTERECTOMY  2/11   Dr Jamal Collin  . [place]  . CHOLECYSTECTOMY    . CORONARY ARTERY BYPASS GRAFT     2017  . CT ABD W & PELVIS WO CM  2002   Negative. Pituitary normal on MRI  . HEEL SPUR SURGERY  03/03   Left  . INTRAMEDULLARY (IM) NAIL INTERTROCHANTERIC Left 08/04/2015   Procedure: INTRAMEDULLARY (IM) NAIL INTERTROCHANTRIC;  Surgeon: Hessie Knows, MD;  Location: [facility];  Service: Orthopedics;  Laterality: Left;  . KNEE ARTHROSCOPY  2001   Left   Tamala Julian)  . ROBOT ASSISTED LAPAROSCOPIC NEPHRECTOMY Left 10/02/2016   Procedure: ROBOTIC ASSISTED LAPAROSCOPIC  RADICAL NEPHRECTOMY;  Surgeon: Hollice Espy, MD;  Location: [facility];  Service: Urology;  Laterality: Left;  . TEE WITHOUT CARDIOVERSION N/A 04/11/2016   Procedure: TRANSESOPHAGEAL ECHOCARDIOGRAM (TEE);  Surgeon: Teodoro Spray, MD;  Location: [facility];  Service: Cardiovascular;  Laterality: N/A;  . TOTAL KNEE ARTHROPLASTY  11/12   left--Dr Tamala Julian  . TRANSTHORACIC ECHOCARDIOGRAM  3/04   normal    Social History   Social History  . Marital status: Widowed    Spouse name: N/A  . Number of children: 3  . Years of education: N/A   Occupational History  . Receptionist---retired     [place]  .     [employer]      Social History Main Topics  . Smoking status: Former Smoker    Types: Cigarettes    Quit date: 09/06/1986  . Smokeless tobacco: Never Used  . Alcohol use No  . Drug use: No  . Sexual activity: Not on file   Other Topics Concern  . Not on file   Social History Narrative   Widowed 07/02.  Kids have moved out and now living with friend to  share expenses.      3 caffeine drinks daily      Family History  Problem Relation Age of Onset  . Cancer Mother     Breast  . Heart disease Mother   . Diabetes Mother   . Colon polyps Mother   . Heart disease Father   . Diabetes Father   . Hypertension Sister   . Diabetes Sister   . Depression Daughter   . Cancer Paternal Grandmother     ? throat CA  . Colon cancer      Unsure if mother had it   . Bladder Cancer Neg Hx   . Kidney cancer Neg Hx      Current Outpatient Prescriptions:  .  aspirin EC 81 MG tablet, Take 1 tablet (81 mg total) by mouth daily., Disp: 90 tablet, Rfl: 3 .  cetirizine (ZYRTEC) 10 MG tablet, Take 10 mg by mouth as needed. , Disp: , Rfl:  .  citalopram (CELEXA) 40 MG tablet, Take 40 mg by mouth daily. In am., Disp: , Rfl:  .  docusate sodium (COLACE) 100 MG capsule, Take 1 capsule (100 mg total) by mouth 2 (two) times daily., Disp: 60 capsule, Rfl: 0 .  hydrochlorothiazide (HYDRODIURIL) 25  MG tablet, Take 25 mg by mouth daily., Disp: , Rfl:  .  levothyroxine (SYNTHROID, LEVOTHROID) 100 MCG tablet, Take 1 tablet (100 mcg total) by mouth daily., Disp: 90 tablet, Rfl: 1 .  metoprolol tartrate (LOPRESSOR) 25 MG tablet, TAKE 1 TABLET TWICE DAILY (Patient taking differently: 1 tablet in am.), Disp: 180 tablet, Rfl: 3 .  oxyCODONE (OXY IR/ROXICODONE) 5 MG immediate release tablet, Take 1 tablet (5 mg total) by mouth every 4 (four) hours as needed for moderate pain., Disp: 10 tablet, Rfl: 0   Physical exam: There were no vitals filed for this visit. Physical Exam  Constitutional: She is oriented to person, place, and time.  She appears older than stated age. She is sitting in a  wheelchair  HENT:  Head: Normocephalic and atraumatic.  Eyes: EOM are normal. Pupils are equal, round, and reactive to light.  Neck: Normal range of motion.  Cardiovascular: Normal rate, regular rhythm and normal heart sounds.   Pulmonary/Chest: Effort normal and breath sounds normal.  Abdominal: Soft. Bowel sounds are normal.  Surgical site of nephrectomy appears well healed. Does not look infected. TTP over surgical site.   Neurological: She is alert and oriented to person, place, and time.  Skin: Skin is warm and dry.       CMP Latest Ref Rng & Units 10/04/2016  Glucose 65 - 99 mg/dL 147(H)  BUN 6 - 20 mg/dL 12  Creatinine 0.44 - 1.00 mg/dL 0.82  Sodium 135 - 145 mmol/L 136  Potassium 3.5 - 5.1 mmol/L 3.7  Chloride 101 - 111 mmol/L 104  CO2 22 - 32 mmol/L 26  Calcium 8.9 - 10.3 mg/dL 8.1(L)  Total Protein 6.5 - 8.1 g/dL -  Total Bilirubin 0.3 - 1.2 mg/dL -  Alkaline Phos 38 - 126 U/L -  AST 15 - 41 U/L -  ALT 14 - 54 U/L -   CBC Latest Ref Rng & Units 10/05/2016  WBC 3.6 - 11.0 K/uL -  Hemoglobin 12.0 - 16.0 g/dL 10.5(L)  Hematocrit 35.0 - 47.0 % 31.4(L)  Platelets 150 - 440 K/uL -     Assessment and plan- Patient is a 66 y.o. female with newly diagnosed likely Stage IV [id number] high  grade urothelial  carcinoma of renal pelvis  Although LN were not examined and not obviously involved on  CT scan, given the intraoperative findings of locally extensive high grade malignancy and positive hilar margin; she likely has LN involvement and has Stage IV disease. Going back for repeat surgery does not seem to be an option at this time. I will obtain repeat CT chest abdomen pelvis and bone scan for complete staging work up to assess her present disease burden. Will get CBC, CMP and CEA today. I will discuss her case at Tumor board next week and discuss with Radiation Oncology if there would be a role for radiation here although I think we are dealing with metastatic disease at this point. I am leaning towards palliative immunotherapy with keytruda at this point. She has a solitary kidney, has been a poorly controlled diabetic in the past and has recurrent MRSA infections. I do not think she therefore she is a good cisplatin candidate. Doing 2 weeks on and 1 week off of gemcitabine and carboplatin is an option if she does not have measurable disease on scans for 6 cycles.  I will see her back in 10 days time and discuss her scans and further management at that point   Total face to face encounter time for this patient visit was 45 min. >50% of the time was  spent in counseling and coordination of care.     Thank you for this kind referral and the opportunity to participate in the care of this patient   Visit Diagnosis 1. Urothelial carcinoma of kidney, left ([place])   2. Goals of care, counseling/discussion     Dr. Randa Evens, MD, MPH [facility] Pager- [phone] 10/19/2016  8:54 AM

## 2016-10-19 NOTE — Progress Notes (Signed)
Here as new pt eval

## 2016-10-20 LAB — CEA: CEA: 2.5 ng/mL (ref 0.0–4.7)

## 2016-10-22 ENCOUNTER — Encounter: Payer: Self-pay | Admitting: Oncology

## 2016-10-22 ENCOUNTER — Other Ambulatory Visit: Payer: Medicare HMO

## 2016-10-22 ENCOUNTER — Other Ambulatory Visit: Payer: Self-pay

## 2016-10-22 MED ORDER — OXYCODONE HCL ER 10 MG PO T12A: 10.0000 mg | EXTENDED_RELEASE_TABLET | ORAL | 0 refills | Status: DC | PRN

## 2016-10-23 ENCOUNTER — Ambulatory Visit
Admission: RE | Admit: 2016-10-23 | Discharge: 2016-10-23 | Disposition: A | Discharge status: Home / Self Care | Payer: Medicare HMO | Source: Ambulatory Visit | Attending: Oncology | Admitting: Oncology

## 2016-10-23 ENCOUNTER — Other Ambulatory Visit: Payer: Self-pay | Admitting: *Deleted

## 2016-10-23 DIAGNOSIS — I700 Atherosclerosis of aorta: Secondary | ICD-10-CM | POA: Insufficient documentation

## 2016-10-23 DIAGNOSIS — Z85528 Personal history of other malignant neoplasm of kidney: Secondary | ICD-10-CM | POA: Diagnosis not present

## 2016-10-23 DIAGNOSIS — Z9049 Acquired absence of other specified parts of digestive tract: Secondary | ICD-10-CM | POA: Insufficient documentation

## 2016-10-23 DIAGNOSIS — Z9889 Other specified postprocedural states: Secondary | ICD-10-CM | POA: Diagnosis not present

## 2016-10-23 DIAGNOSIS — Z905 Acquired absence of kidney: Secondary | ICD-10-CM | POA: Insufficient documentation

## 2016-10-23 DIAGNOSIS — C642 Malignant neoplasm of left kidney, except renal pelvis: Secondary | ICD-10-CM | POA: Diagnosis present

## 2016-10-23 DIAGNOSIS — K5730 Diverticulosis of large intestine without perforation or abscess without bleeding: Secondary | ICD-10-CM | POA: Insufficient documentation

## 2016-10-23 DIAGNOSIS — I2510 Atherosclerotic heart disease of native coronary artery without angina pectoris: Secondary | ICD-10-CM | POA: Insufficient documentation

## 2016-10-23 DIAGNOSIS — D7389 Other diseases of spleen: Secondary | ICD-10-CM | POA: Diagnosis not present

## 2016-10-23 DIAGNOSIS — Z90710 Acquired absence of both cervix and uterus: Secondary | ICD-10-CM | POA: Diagnosis not present

## 2016-10-23 MED ORDER — OXYCODONE HCL 10 MG PO TABS: 10.0000 mg | ORAL_TABLET | ORAL | 0 refills | Status: DC | PRN

## 2016-10-23 NOTE — Telephone Encounter (Signed)
Caregiver came yest to give pain med.  Dr. Janese Banks in [place] and was given rx from the other providers.  The rx was brought back.  It was long acting oxycontin with immediate release q4 hours directions to take. I spoke to rao and she wanted oxycodone 10 mg IR Q4 hours and new rx written and given to caregiver.

## 2016-10-30 ENCOUNTER — Encounter: Payer: Self-pay | Admitting: Oncology

## 2016-10-30 ENCOUNTER — Inpatient Hospital Stay: Payer: Medicare HMO | Attending: Oncology | Admitting: Oncology

## 2016-10-30 VITALS — BP 116/83 | HR 80 | Temp 98.5°F | Wt 183.4 lb

## 2016-10-30 DIAGNOSIS — E669 Obesity, unspecified: Secondary | ICD-10-CM | POA: Diagnosis not present

## 2016-10-30 DIAGNOSIS — Z955 Presence of coronary angioplasty implant and graft: Secondary | ICD-10-CM | POA: Diagnosis not present

## 2016-10-30 DIAGNOSIS — E039 Hypothyroidism, unspecified: Secondary | ICD-10-CM | POA: Diagnosis not present

## 2016-10-30 DIAGNOSIS — C7900 Secondary malignant neoplasm of unspecified kidney and renal pelvis: Secondary | ICD-10-CM | POA: Insufficient documentation

## 2016-10-30 DIAGNOSIS — Z794 Long term (current) use of insulin: Secondary | ICD-10-CM | POA: Diagnosis not present

## 2016-10-30 DIAGNOSIS — Z87891 Personal history of nicotine dependence: Secondary | ICD-10-CM | POA: Insufficient documentation

## 2016-10-30 DIAGNOSIS — Z905 Acquired absence of kidney: Secondary | ICD-10-CM | POA: Diagnosis not present

## 2016-10-30 DIAGNOSIS — K5730 Diverticulosis of large intestine without perforation or abscess without bleeding: Secondary | ICD-10-CM | POA: Diagnosis not present

## 2016-10-30 DIAGNOSIS — N189 Chronic kidney disease, unspecified: Secondary | ICD-10-CM | POA: Diagnosis not present

## 2016-10-30 DIAGNOSIS — I252 Old myocardial infarction: Secondary | ICD-10-CM | POA: Insufficient documentation

## 2016-10-30 DIAGNOSIS — I25810 Atherosclerosis of coronary artery bypass graft(s) without angina pectoris: Secondary | ICD-10-CM | POA: Insufficient documentation

## 2016-10-30 DIAGNOSIS — C679 Malignant neoplasm of bladder, unspecified: Secondary | ICD-10-CM | POA: Insufficient documentation

## 2016-10-30 DIAGNOSIS — Z5111 Encounter for antineoplastic chemotherapy: Secondary | ICD-10-CM | POA: Diagnosis present

## 2016-10-30 DIAGNOSIS — G893 Neoplasm related pain (acute) (chronic): Secondary | ICD-10-CM

## 2016-10-30 DIAGNOSIS — F419 Anxiety disorder, unspecified: Secondary | ICD-10-CM | POA: Insufficient documentation

## 2016-10-30 DIAGNOSIS — I2510 Atherosclerotic heart disease of native coronary artery without angina pectoris: Secondary | ICD-10-CM | POA: Diagnosis not present

## 2016-10-30 DIAGNOSIS — E1165 Type 2 diabetes mellitus with hyperglycemia: Secondary | ICD-10-CM | POA: Insufficient documentation

## 2016-10-30 DIAGNOSIS — C689 Malignant neoplasm of urinary organ, unspecified: Secondary | ICD-10-CM

## 2016-10-30 DIAGNOSIS — E785 Hyperlipidemia, unspecified: Secondary | ICD-10-CM | POA: Insufficient documentation

## 2016-10-30 DIAGNOSIS — I129 Hypertensive chronic kidney disease with stage 1 through stage 4 chronic kidney disease, or unspecified chronic kidney disease: Secondary | ICD-10-CM

## 2016-10-30 DIAGNOSIS — Z79899 Other long term (current) drug therapy: Secondary | ICD-10-CM

## 2016-10-30 DIAGNOSIS — Z7982 Long term (current) use of aspirin: Secondary | ICD-10-CM | POA: Diagnosis not present

## 2016-10-30 DIAGNOSIS — F329 Major depressive disorder, single episode, unspecified: Secondary | ICD-10-CM | POA: Insufficient documentation

## 2016-10-30 DIAGNOSIS — K219 Gastro-esophageal reflux disease without esophagitis: Secondary | ICD-10-CM | POA: Diagnosis not present

## 2016-10-30 DIAGNOSIS — Z7189 Other specified counseling: Secondary | ICD-10-CM

## 2016-10-30 MED ORDER — ONDANSETRON HCL 4 MG PO TABS: 4.0000 mg | ORAL_TABLET | Freq: Four times a day (QID) | ORAL | 0 refills | Status: DC | PRN

## 2016-11-01 ENCOUNTER — Other Ambulatory Visit: Payer: Self-pay | Admitting: *Deleted

## 2016-11-01 DIAGNOSIS — C499 Malignant neoplasm of connective and soft tissue, unspecified: Secondary | ICD-10-CM

## 2016-11-01 NOTE — Patient Instructions (Signed)
Pembrolizumab injection  What is this medicine?  PEMBROLIZUMAB (pem broe liz ue mab) is a monoclonal antibody. It is used to treat melanoma, head and neck cancer, Hodgkin lymphoma, non-small cell lung cancer, urothelial cancer, stomach cancer, and cancers that have a certain genetic condition.  This medicine may be used for other purposes; ask your health care provider or pharmacist if you have questions.  COMMON BRAND NAME(S): Keytruda  What should I tell my health care provider before I take this medicine?  They need to know if you have any of these conditions:  -diabetes  -immune system problems  -inflammatory bowel disease  -liver disease  -lung or breathing disease  -lupus  -organ transplant  -an unusual or allergic reaction to pembrolizumab, other medicines, foods, dyes, or preservatives  -pregnant or trying to get pregnant  -breast-feeding  How should I use this medicine?  This medicine is for infusion into a vein. It is given by a health care professional in a hospital or clinic setting.  A special MedGuide will be given to you before each treatment. Be sure to read this information carefully each time.  Talk to your pediatrician regarding the use of this medicine in children. While this drug may be prescribed for selected conditions, precautions do apply.  Overdosage: If you think you have taken too much of this medicine contact a poison control center or emergency room at once.  NOTE: This medicine is only for you. Do not share this medicine with others.  What if I miss a dose?  It is important not to miss your dose. Call your doctor or health care professional if you are unable to keep an appointment.  What may interact with this medicine?  Interactions have not been studied.  Give your health care provider a list of all the medicines, herbs, non-prescription drugs, or dietary supplements you use. Also tell them if you smoke, drink alcohol, or use illegal drugs. Some items may interact with your  medicine.  This list may not describe all possible interactions. Give your health care provider a list of all the medicines, herbs, non-prescription drugs, or dietary supplements you use. Also tell them if you smoke, drink alcohol, or use illegal drugs. Some items may interact with your medicine.  What should I watch for while using this medicine?  Your condition will be monitored carefully while you are receiving this medicine.  You may need blood work done while you are taking this medicine.  Do not become pregnant while taking this medicine or for 4 months after stopping it. Women should inform their doctor if they wish to become pregnant or think they might be pregnant. There is a potential for serious side effects to an unborn child. Talk to your health care professional or pharmacist for more information. Do not breast-feed an infant while taking this medicine or for 4 months after the last dose.  What side effects may I notice from receiving this medicine?  Side effects that you should report to your doctor or health care professional as soon as possible:  -allergic reactions like skin rash, itching or hives, swelling of the face, lips, or tongue  -bloody or black, tarry  -breathing problems  -changes in vision  -chest pain  -chills  -constipation  -cough  -dizziness or feeling faint or lightheaded  -fast or irregular heartbeat  -fever  -flushing  -hair loss  -low blood counts - this medicine may decrease the number of white blood cells, red blood cells   and platelets. You may be at increased risk for infections and bleeding.  -muscle pain  -muscle weakness  -persistent headache  -signs and symptoms of high blood sugar such as dizziness; dry mouth; dry skin; fruity breath; nausea; stomach pain; increased hunger or thirst; increased urination  -signs and symptoms of kidney injury like trouble passing urine or change in the amount of urine  -signs and symptoms of liver injury like dark urine, light-colored  stools, loss of appetite, nausea, right upper belly pain, yellowing of the eyes or skin  -stomach pain  -sweating  -weight loss  Side effects that usually do not require medical attention (report to your doctor or health care professional if they continue or are bothersome):  -decreased appetite  -diarrhea  -tiredness  This list may not describe all possible side effects. Call your doctor for medical advice about side effects. You may report side effects to FDA at 1-800-FDA-1088.  Where should I keep my medicine?  This drug is given in a hospital or clinic and will not be stored at home.  NOTE: This sheet is a summary. It may not cover all possible information. If you have questions about this medicine, talk to your doctor, pharmacist, or health care provider.   2018 Elsevier/Gold Standard (2016-03-20 12:29:36)

## 2016-11-02 ENCOUNTER — Other Ambulatory Visit: Payer: Self-pay | Admitting: *Deleted

## 2016-11-02 ENCOUNTER — Telehealth: Payer: Self-pay | Admitting: *Deleted

## 2016-11-02 DIAGNOSIS — Z7189 Other specified counseling: Secondary | ICD-10-CM | POA: Insufficient documentation

## 2016-11-02 DIAGNOSIS — C689 Malignant neoplasm of urinary organ, unspecified: Secondary | ICD-10-CM | POA: Insufficient documentation

## 2016-11-02 MED ORDER — ONDANSETRON HCL 8 MG PO TABS: 8.0000 mg | ORAL_TABLET | Freq: Two times a day (BID) | ORAL | 1 refills | Status: DC | PRN

## 2016-11-02 MED ORDER — LIDOCAINE-PRILOCAINE 2.5-2.5 % EX CREA: TOPICAL_CREAM | CUTANEOUS | 3 refills | Status: DC

## 2016-11-02 MED ORDER — LORAZEPAM 0.5 MG PO TABS: 0.5000 mg | ORAL_TABLET | Freq: Four times a day (QID) | ORAL | 0 refills | Status: DC | PRN

## 2016-11-02 MED ORDER — PROCHLORPERAZINE MALEATE 10 MG PO TABS: 10.0000 mg | ORAL_TABLET | Freq: Four times a day (QID) | ORAL | 1 refills | Status: DC | PRN

## 2016-11-02 NOTE — Progress Notes (Signed)
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 Erlene Quan in March 2018. In late February 2018 patient presented with acute onset left-sided flank pain and was found to have a large 6 x 6.6 x 6 1 cm mid to left upper pole renal mass on which was directly fading the left adrenal gland measuring 3.1 x 3.9 cm. There was no obvious renal vein invasion. No intra-abdominal adenopathy was noted and chest x-ray did not reveal any obvious pulmonary disease  2. Patient underwent left radical nephrectomy on 10/02/2016. During the time of surgery the mass appeared incredibly fibrotic, desmoplastic reaction was noted in the medial aspect of the kidney in the hilar area involving the upper part of the hilum and the adrenal gland. There was also concern for renal vein invasion. Distal ureterectomy and lymph node dissection was not performed thinking that this was possibly a renal cell carcinoma.  3. DIAGNOSIS:  A. LEFT KIDNEY; ROBOTIC-ASSISTED LAPAROSCOPIC RADICAL NEPHRECTOMY:  - UROTHELIAL CARCINOMA, HIGH-GRADE.  - LYMPHOVASCULAR AND PERINEURAL INVASION PRESENT.  - THE HILAR MARGIN IS POSITIVE.  - THE URETERAL AND GEROTA'S FASCIA MARGINS ARE NEGATIVE.  - SEE SUMMARY BELOW.   Surgical Pathology Cancer Case Summary   RENAL PELVIS AND URETER:  Procedure:  Robotic-assisted laparoscopic radical nephrectomy  Specimen Laterality: Left  Tumor Site: Hilum, upper and lower poles  Histologic Type: Urothelial carcinoma  Histologic Grade: High  grade  Tumor Extension: Tumor invades adjacent organs (adrenal gland) and  through the kidney into the perinephric fat  Margins: Hilar margin positive; ureteral and Gerota's fasciamargins  negative  Lymphovascular Invasion: Present  Regional Lymph Nodes: No lymph nodes submitted or found  Pathologic Stage Classification (pTNM, AJCC 8th Edition): pT4 pNX  TNM Descriptors: Not applicable  4. Discussed patient's case with Dr. Erlene Quan urology. She does not think that repeat surgery for distal ureterecomy and LN dissection can be performed given patients performance status and comorbidities. Patient lives with her sister and has limited mobility around the house. She is still complaining of pain the surgical site.   5. Case discussed at tumor board and pathology slides and images reviewed. Repeat ct chest/abdomen does not reveal gross disease post surgery. However, operative findings suggest locally advanced carcinoma with positive margins suggestive of residual disease. LN not assessed although likely involved making this stage IV  Interval history- still has pain around surgical site but getting better with oxycodone. Oxycodone does make her itch  ECOG PS- 2-3 Pain scale- 4 Opioid associated constipation- no  Review of systems- Review of Systems  Constitutional: Positive for malaise/fatigue. Negative for chills, fever and weight loss.  HENT: Negative for congestion, ear discharge and nosebleeds.   Eyes: Negative for blurred vision.  Respiratory: Negative for cough, hemoptysis, sputum production, shortness of breath and wheezing.   Cardiovascular: Negative for chest pain, palpitations, orthopnea and claudication.  Gastrointestinal: Positive for abdominal pain. Negative for blood in stool, constipation, diarrhea, heartburn, melena, nausea and vomiting.  Genitourinary: Negative for dysuria, flank pain, frequency, hematuria and urgency.  Musculoskeletal: Negative for back pain, joint pain and  myalgias.  Skin: Negative for rash.  Neurological: Negative for dizziness, tingling, focal weakness, seizures, weakness and headaches.  Endo/Heme/Allergies: Does not bruise/bleed easily.  Psychiatric/Behavioral: Negative for depression and suicidal ideas. The patient does not have insomnia.        Allergies  Allergen Reactions  . Norco [Hydrocodone-Acetaminophen] Other (See Comments)    hallucinations hallucinations  . Codeine Phosphate Other (See Comments)    REACTION: unspecified  . Duloxetine Other (See Comments)    REACTION: unspecified  . Meperidine Hcl Other (See Comments)    REACTION: unspecified  . Venlafaxine Other (See Comments)    REACTION: unspecified  . Doxycycline Hives and Rash     Past Medical History:  Diagnosis Date  . Adenomatous polyp 1999  . Anxiety   . Arthritis    Left knee  . CAD (coronary artery disease) of artery bypass graft    a. NSTEMI in 07/2015: Cath showed 99% stenosis of distal LCx  --> DES placed  . Depression    Paranoid tendencies  . Diabetes mellitus    Type II  . Diverticulosis   . GERD (gastroesophageal reflux disease)   . Hyperlipidemia   . Hypertension   . Hypothyroidism   . Myocardial infarction [facility]) 2017   2016 per pt  . Obesity      Past Surgical History:  Procedure Laterality Date  . ABDOMINAL HYSTERECTOMY  1977   One ovary left  . APPENDECTOMY    . BLADDER REPAIR  1977   Tacking  . CARDIAC CATHETERIZATION N/A 08/01/2015   Procedure: Left Heart Cath and Coronary Angiography;  Surgeon: Wellington Hampshire, MD;  Location: [place] [facility];  Service: Cardiovascular;  Laterality: N/A;  . CARDIAC CATHETERIZATION N/A 08/01/2015   Procedure: Coronary Stent Intervention;  Surgeon: Wellington Hampshire, MD;  Location: [place] [facility];  Service: Cardiovascular;  Laterality: N/A;  . CARDIOVASCULAR STRESS TEST  7/00   negative. No cardiolite  . CAROTID ENDARTERECTOMY  2/11   Dr Jamal Collin  . [place]  .  CHOLECYSTECTOMY    . CORONARY ARTERY BYPASS GRAFT     2017  . CT ABD W & PELVIS WO CM  2002   Negative. Pituitary normal on MRI  . HEEL SPUR SURGERY  03/03   Left  . INTRAMEDULLARY (IM) NAIL INTERTROCHANTERIC Left 08/04/2015   Procedure: INTRAMEDULLARY (IM) NAIL INTERTROCHANTRIC;  Surgeon: Hessie Knows, MD;  Location: [facility];  Service: Orthopedics;  Laterality: Left;  . KNEE ARTHROSCOPY  2001   Left   Tamala Julian)  . RENAL MASS EXCISION Left april10/18   per pt left kidney removed -cancerous per pt /family  . ROBOT ASSISTED LAPAROSCOPIC NEPHRECTOMY Left 10/02/2016   Procedure: ROBOTIC ASSISTED LAPAROSCOPIC RADICAL NEPHRECTOMY;  Surgeon: Hollice Espy, MD;  Location: [facility];  Service: Urology;  Laterality: Left;  . TEE WITHOUT CARDIOVERSION N/A 04/11/2016   Procedure: TRANSESOPHAGEAL ECHOCARDIOGRAM (TEE);  Surgeon: Teodoro Spray, MD;  Location: [facility];  Service: Cardiovascular;  Laterality: N/A;  . TOTAL KNEE ARTHROPLASTY  11/12   left--Dr Tamala Julian  . TRANSTHORACIC ECHOCARDIOGRAM  3/04   normal    Social History   Social History  . Marital status: Widowed    Spouse name: N/A  . Number of children: 3  . Years of education: N/A   Occupational History  . Receptionist---retired     [place]  .     [employer]      Social History Main Topics  .  Smoking status: Former Smoker    Packs/day: 2.00    Years: 15.00    Types: Cigarettes    Quit date: 09/06/1986  . Smokeless tobacco: Never Used  . Alcohol use No  . Drug use: No  . Sexual activity: Not on file   Other Topics Concern  . Not on file   Social History Narrative   Widowed 07/02.  Kids have moved out and now living with friend to share expenses.      3 caffeine drinks daily     Family History  Problem Relation Age of Onset  . Cancer Mother        Breast  . Heart disease Mother   . Diabetes Mother   . Colon polyps Mother   . Heart disease Father   . Diabetes Father   . Hypertension Sister   . Diabetes Sister    . Depression Daughter   . Cancer Paternal Grandmother        ? throat CA  . Colon cancer Unknown        Unsure if mother had it   . Rheum arthritis Sister   . Bladder Cancer Neg Hx   . Kidney cancer Neg Hx      Current Outpatient Prescriptions:  .  ACCU-CHEK SMARTVIEW test strip, , Disp: , Rfl:  .  aspirin EC 81 MG tablet, Take 1 tablet (81 mg total) by mouth daily., Disp: 90 tablet, Rfl: 3 .  citalopram (CELEXA) 40 MG tablet, Take 40 mg by mouth daily. In am., Disp: , Rfl:  .  docusate sodium (COLACE) 100 MG capsule, Take 1 capsule (100 mg total) by mouth 2 (two) times daily., Disp: 60 capsule, Rfl: 0 .  hydrochlorothiazide (HYDRODIURIL) 25 MG tablet, Take 25 mg by mouth daily., Disp: , Rfl:  .  insulin aspart (NOVOLOG) 100 UNIT/ML FlexPen, Inject into the skin., Disp: , Rfl:  .  insulin glargine (LANTUS) 100 UNIT/ML injection, Inject 24 Units into the skin at bedtime., Disp: , Rfl:  .  insulin lispro (HUMALOG) 100 UNIT/ML injection, Inject 10 Units into the skin once., Disp: , Rfl:  .  levothyroxine (SYNTHROID, LEVOTHROID) 100 MCG tablet, Take 1 tablet (100 mcg total) by mouth daily., Disp: 90 tablet, Rfl: 1 .  metoprolol tartrate (LOPRESSOR) 25 MG tablet, TAKE 1 TABLET TWICE DAILY (Patient taking differently: 1 tablet in am.), Disp: 180 tablet, Rfl: 3 .  Oxycodone HCl 10 MG TABS, Take 1 tablet (10 mg total) by mouth every 4 (four) hours as needed., Disp: 60 tablet, Rfl: 0 .  traZODone (DESYREL) 100 MG tablet, Take 100 mg by mouth at bedtime., Disp: , Rfl:  .  lidocaine-prilocaine (EMLA) cream, Apply to affected area once, Disp: 30 g, Rfl: 3 .  LORazepam (ATIVAN) 0.5 MG tablet, Take 1 tablet (0.5 mg total) by mouth every 6 (six) hours as needed (Nausea or vomiting)., Disp: 30 tablet, Rfl: 0 .  ondansetron (ZOFRAN) 4 MG tablet, Take 1 tablet (4 mg total) by mouth every 6 (six) hours as needed for nausea or vomiting., Disp: 30 tablet, Rfl: 0 .  ondansetron (ZOFRAN) 8 MG tablet, Take 1  tablet (8 mg total) by mouth 2 (two) times daily as needed (Nausea or vomiting)., Disp: 30 tablet, Rfl: 1 .  prochlorperazine (COMPAZINE) 10 MG tablet, Take 1 tablet (10 mg total) by mouth every 6 (six) hours as needed (Nausea or vomiting)., Disp: 30 tablet, Rfl: 1  Physical exam:  Vitals:   10/30/16 1617  BP: 116/83  Pulse: 80  Temp: 98.5 F (36.9 C)  TempSrc: Tympanic  Weight: 183 lb 6.4 oz (83.2 kg)   Physical Exam  Constitutional: She is oriented to person, place, and time.  She appears older than stated age. She is sitting in a wheelchair  HENT:  Head: Normocephalic and atraumatic.  Eyes: EOM are normal. Pupils are equal, round, and reactive to light.  Neck: Normal range of motion.  Cardiovascular: Normal rate, regular rhythm and normal heart sounds.   Pulmonary/Chest: Effort normal and breath sounds normal.  Abdominal: Soft. Bowel sounds are normal.  ttp along surgical suture site over anterior abdominal wall. No flank tenderness  Neurological: She is alert and oriented to person, place, and time.  Skin: Skin is warm and dry.     CMP Latest Ref Rng & Units 10/19/2016  Glucose 65 - 99 mg/dL 147(H)  BUN 6 - 20 mg/dL 20  Creatinine 0.44 - 1.00 mg/dL 2.05(H)  Sodium 135 - 145 mmol/L 136  Potassium 3.5 - 5.1 mmol/L 4.7  Chloride 101 - 111 mmol/L 102  CO2 22 - 32 mmol/L 24  Calcium 8.9 - 10.3 mg/dL 9.1  Total Protein 6.5 - 8.1 g/dL 7.3  Total Bilirubin 0.3 - 1.2 mg/dL 0.7  Alkaline Phos 38 - 126 U/L 86  AST 15 - 41 U/L 16  ALT 14 - 54 U/L 8(L)   CBC Latest Ref Rng & Units 10/19/2016  WBC 3.6 - 11.0 K/uL 8.6  Hemoglobin 12.0 - 16.0 g/dL 12.1  Hematocrit 35.0 - 47.0 % 36.9  Platelets 150 - 440 K/uL 457(H)    No images are attached to the encounter.  Ct Abdomen Pelvis Wo Contrast  Result Date: 10/23/2016 CLINICAL DATA:  Left flank pain and left lower quadrant pain for 3 months. History of nephrectomy for urothelial carcinoma of the kidney. No known injury. History of  cholecystectomy and appendectomy. EXAM: CT CHEST, ABDOMEN AND PELVIS WITHOUT CONTRAST TECHNIQUE: Multidetector CT imaging of the chest, abdomen and pelvis was performed following the standard protocol without IV contrast. COMPARISON:  Chest CT July 30, 2015 and CT of the abdomen and pelvis August 19, 2016 FINDINGS: CT CHEST FINDINGS Cardiovascular: The thoracic aorta and central pulmonary arteries are normal. Coronary artery calcifications are noted. The heart is unchanged. Mediastinum/Nodes: No enlarged mediastinal, hilar, or axillary lymph nodes. Thyroid gland, trachea, and esophagus demonstrate no significant findings. Lungs/Pleura: Lungs are clear. No pleural effusion or pneumothorax. CT ABDOMEN PELVIS FINDINGS Hepatobiliary: No focal liver abnormality is seen. Status post cholecystectomy. No biliary dilatation. Pancreas: The pancreatic head, neck, and body are normal. There is some increased attenuation in the fat around the tail. This increased attenuation could be postsurgical as the tail has fallen posteriorly into the region of nephrectomy. Spleen: The spleen has been displaced posteriorly and medially into the site of nephrectomy. A small amount of increased attenuation along the posteromedial aspect of the spleen is identified such as on series 2, image 51. There is mild increased attenuation in adjacent fat as well. These changes could all be postsurgical in nature. Adrenals/Urinary Tract: The left adrenal gland and kidney have been removed. Increased attenuation in the region of the nephrectomy bed could be postsurgical. No definitive mass. Mild increased attenuation of fat of this region could also be postsurgical. Pancreatic tail and spleen now occupy the space of recent nephrectomy. The right adrenal gland and kidney are normal. The bladder is normal. The right ureter is nondilated. A small portion of distal  left ureter is identified without obvious abnormality. Stomach/Bowel: The stomach and  small bowel are normal. Colonic diverticulosis is identified. There is no definitive diverticulitis. Minimal increased attenuation in the fat adjacent to the sigmoid colon would make it difficult to completely exclude a very low grade sigmoid diverticulitis, especially given the patient's symptoms. The remainder of the colon is normal. The patient is status post appendectomy. Vascular/Lymphatic: Atherosclerotic changes are seen in the abdominal aorta and iliac vessels without aneurysm. No adenopathy identified. Reproductive: The patient is status post hysterectomy. The left ovary appears remain with a central calcification, unchanged. Other: No abdominal wall hernia or abnormality. No abdominopelvic ascites. Musculoskeletal: At L1 compression fracture stable. Status post left hip fracture repair. No other bony abnormalities identified. IMPRESSION: 1. The patient is status post left adrenalectomy and left nephrectomy. Increased attenuation in the region of nephrectomy, including in the fat of this region may all be postoperative. While there is no definitive recurrence, it would be very difficult to exclude a subtle recurrence or residual tumor given history of positive margins and postoperative changes. 2. Sigmoid diverticulosis. There is no definitive diverticulitis. However, it would be difficult to exclude very low-grade subtle diverticulitis, especially given patient's symptoms. 3. The pancreatic tail and spleen now lie within the nephrectomy site. The mild increased attenuation in the adjacent fat may all be postoperative. 4. Atherosclerosis. Electronically Signed   By: Dorise Bullion III M.D   On: 10/23/2016 17:29   Ct Chest Wo Contrast  Result Date: 10/23/2016 CLINICAL DATA:  Left flank pain and left lower quadrant pain for 3 months. History of nephrectomy for urothelial carcinoma of the kidney. No known injury. History of cholecystectomy and appendectomy. EXAM: CT CHEST, ABDOMEN AND PELVIS WITHOUT  CONTRAST TECHNIQUE: Multidetector CT imaging of the chest, abdomen and pelvis was performed following the standard protocol without IV contrast. COMPARISON:  Chest CT July 30, 2015 and CT of the abdomen and pelvis August 19, 2016 FINDINGS: CT CHEST FINDINGS Cardiovascular: The thoracic aorta and central pulmonary arteries are normal. Coronary artery calcifications are noted. The heart is unchanged. Mediastinum/Nodes: No enlarged mediastinal, hilar, or axillary lymph nodes. Thyroid gland, trachea, and esophagus demonstrate no significant findings. Lungs/Pleura: Lungs are clear. No pleural effusion or pneumothorax. CT ABDOMEN PELVIS FINDINGS Hepatobiliary: No focal liver abnormality is seen. Status post cholecystectomy. No biliary dilatation. Pancreas: The pancreatic head, neck, and body are normal. There is some increased attenuation in the fat around the tail. This increased attenuation could be postsurgical as the tail has fallen posteriorly into the region of nephrectomy. Spleen: The spleen has been displaced posteriorly and medially into the site of nephrectomy. A small amount of increased attenuation along the posteromedial aspect of the spleen is identified such as on series 2, image 51. There is mild increased attenuation in adjacent fat as well. These changes could all be postsurgical in nature. Adrenals/Urinary Tract: The left adrenal gland and kidney have been removed. Increased attenuation in the region of the nephrectomy bed could be postsurgical. No definitive mass. Mild increased attenuation of fat of this region could also be postsurgical. Pancreatic tail and spleen now occupy the space of recent nephrectomy. The right adrenal gland and kidney are normal. The bladder is normal. The right ureter is nondilated. A small portion of distal left ureter is identified without obvious abnormality. Stomach/Bowel: The stomach and small bowel are normal. Colonic diverticulosis is identified. There is no  definitive diverticulitis. Minimal increased attenuation in the fat adjacent to the sigmoid colon  would make it difficult to completely exclude a very low grade sigmoid diverticulitis, especially given the patient's symptoms. The remainder of the colon is normal. The patient is status post appendectomy. Vascular/Lymphatic: Atherosclerotic changes are seen in the abdominal aorta and iliac vessels without aneurysm. No adenopathy identified. Reproductive: The patient is status post hysterectomy. The left ovary appears remain with a central calcification, unchanged. Other: No abdominal wall hernia or abnormality. No abdominopelvic ascites. Musculoskeletal: At L1 compression fracture stable. Status post left hip fracture repair. No other bony abnormalities identified. IMPRESSION: 1. The patient is status post left adrenalectomy and left nephrectomy. Increased attenuation in the region of nephrectomy, including in the fat of this region may all be postoperative. While there is no definitive recurrence, it would be very difficult to exclude a subtle recurrence or residual tumor given history of positive margins and postoperative changes. 2. Sigmoid diverticulosis. There is no definitive diverticulitis. However, it would be difficult to exclude very low-grade subtle diverticulitis, especially given patient's symptoms. 3. The pancreatic tail and spleen now lie within the nephrectomy site. The mild increased attenuation in the adjacent fat may all be postoperative. 4. Atherosclerosis. Electronically Signed   By: Dorise Bullion III M.D   On: 10/23/2016 17:29     Assessment and plan- Patient is a 66 y.o. female with high grade locally advanced upper urothelial carcinoma involving renal pelvis likely Stage IV based on operative findings  Case discussed at tumor board as mentioned above. Radiation therapy not an option given that she had injury to her renal artery during surgery which can potentially damaged with  radiation. Patients baseline PS is poor, she has poorly controlled diabetes and CKD and has a solitary kidney. She is not a candidate for cisplatin and currently not a candidate for cehmotherapy. She does not have gross disease on scans but does have residual tumor and likely LN involevement. This does suggest Stage IV disease. More surgery is not an option. I will proceed with palliative 1st line immunotherapy with keytruda at 200 mg IV Q3 weeks until progression. I discussed the risks and benefits of Keytruda including all but not limited to autoimmune side effects including autoimmune colitis such as diarrhea, monitoring of kidney and liver function to risk of autoimmune hepatitis and nephritis as well as autoimmune pneumonitis and thyroid dysfunction. Patient understands and agrees to proceed. We will plan to get a port placement and tentatively plan to start treatment in a week to 10 days' time. Treatment will be given with palliative intent  Neoplasm related pain- prn oxycodone given to patient   Total face to face encounter time for this patient visit was 45 min. >50% of the time was  spent in counseling and coordination of care.     Visit Diagnosis 1. Urothelial cancer ([place])   2. Goals of care, counseling/discussion   3. Neoplasm related pain      Dr. Randa Evens, MD, MPH [facility] Pager- [phone] 11/02/2016 1:27 PM

## 2016-11-02 NOTE — Telephone Encounter (Signed)
Called daughter Orlando Penner to go over all the appts. For pt. She already knew about the chemo education class 5/15.  She will need to have her port placed on 5/17 at . [facility] 1[facility] and procedure at  2:30.  She will have to be NPO for 8 hours. She will need driver to take her home. She has appt for chemo 5/18 she would need to here at 9 am for labs, see md and then chemo. Daughter agreeable to the plan

## 2016-11-05 ENCOUNTER — Other Ambulatory Visit: Payer: Self-pay | Admitting: *Deleted

## 2016-11-05 DIAGNOSIS — C689 Malignant neoplasm of urinary organ, unspecified: Secondary | ICD-10-CM

## 2016-11-05 MED ORDER — LORAZEPAM 0.5 MG PO TABS: 0.5000 mg | ORAL_TABLET | Freq: Four times a day (QID) | ORAL | 0 refills | Status: DC | PRN

## 2016-11-06 ENCOUNTER — Inpatient Hospital Stay: Payer: Medicare HMO

## 2016-11-07 ENCOUNTER — Other Ambulatory Visit: Payer: Self-pay | Admitting: Radiology

## 2016-11-08 ENCOUNTER — Ambulatory Visit: Payer: Medicare HMO

## 2016-11-08 ENCOUNTER — Ambulatory Visit
Admission: RE | Admit: 2016-11-08 | Discharge: 2016-11-08 | Disposition: A | Discharge status: Home / Self Care | Payer: Medicare HMO | Source: Ambulatory Visit | Attending: Oncology | Admitting: Oncology

## 2016-11-08 ENCOUNTER — Inpatient Hospital Stay: Payer: Medicare HMO

## 2016-11-08 ENCOUNTER — Ambulatory Visit: Payer: Medicare HMO | Admitting: Oncology

## 2016-11-08 ENCOUNTER — Other Ambulatory Visit: Payer: Medicare HMO

## 2016-11-08 DIAGNOSIS — C642 Malignant neoplasm of left kidney, except renal pelvis: Secondary | ICD-10-CM | POA: Insufficient documentation

## 2016-11-08 DIAGNOSIS — C499 Malignant neoplasm of connective and soft tissue, unspecified: Secondary | ICD-10-CM

## 2016-11-08 LAB — CBC WITH DIFFERENTIAL/PLATELET
Basophils Absolute: 0.1 10*3/uL (ref 0–0.1)
Basophils Relative: 1 %
EOS PCT: 6 %
Eosinophils Absolute: 0.3 10*3/uL (ref 0–0.7)
HCT: 34.3 % — ABNORMAL LOW (ref 35.0–47.0)
HEMOGLOBIN: 11.1 g/dL — AB (ref 12.0–16.0)
LYMPHS ABS: 1.7 10*3/uL (ref 1.0–3.6)
LYMPHS PCT: 34 %
MCH: 26.1 pg (ref 26.0–34.0)
MCHC: 32.5 g/dL (ref 32.0–36.0)
MCV: 80.5 fL (ref 80.0–100.0)
Monocytes Absolute: 0.2 10*3/uL (ref 0.2–0.9)
Monocytes Relative: 4 %
Neutro Abs: 2.8 10*3/uL (ref 1.4–6.5)
Neutrophils Relative %: 55 %
PLATELETS: 357 10*3/uL (ref 150–440)
RBC: 4.26 MIL/uL (ref 3.80–5.20)
RDW: 17.4 % — ABNORMAL HIGH (ref 11.5–14.5)
WBC: 5.1 10*3/uL (ref 3.6–11.0)

## 2016-11-08 LAB — PROTIME-INR
INR: 0.95
Prothrombin Time: 12.7 seconds (ref 11.4–15.2)

## 2016-11-08 LAB — APTT: aPTT: 25 seconds (ref 24–36)

## 2016-11-08 MED ORDER — FENTANYL CITRATE (PF) 100 MCG/2ML IJ SOLN
INTRAMUSCULAR | Status: AC | PRN
  Administered 2016-11-08: 25 ug via INTRAVENOUS
  Administered 2016-11-08 (×2): 50 ug via INTRAVENOUS

## 2016-11-08 MED ORDER — MIDAZOLAM HCL 5 MG/5ML IJ SOLN
INTRAMUSCULAR | Status: AC | PRN
  Administered 2016-11-08: 0.5 mg via INTRAVENOUS
  Administered 2016-11-08 (×2): 1 mg via INTRAVENOUS

## 2016-11-08 MED ORDER — FENTANYL CITRATE (PF) 100 MCG/2ML IJ SOLN
INTRAMUSCULAR | Status: AC
  Filled 2016-11-08: qty 2

## 2016-11-08 MED ORDER — MIDAZOLAM HCL 5 MG/5ML IJ SOLN
INTRAMUSCULAR | Status: AC
  Filled 2016-11-08: qty 5

## 2016-11-08 MED ORDER — LIDOCAINE-EPINEPHRINE (PF) 2 %-1:200000 IJ SOLN
INTRAMUSCULAR | Status: AC
  Filled 2016-11-08: qty 20

## 2016-11-08 MED ORDER — CEFAZOLIN SODIUM-DEXTROSE 2-4 GM/100ML-% IV SOLN
2.0000 g | Freq: Once | INTRAVENOUS | Status: AC
  Administered 2016-11-08: 2 g via INTRAVENOUS
  Filled 2016-11-08 (×2): qty 100

## 2016-11-08 NOTE — Procedures (Signed)
Interventional Radiology Procedure Note  Procedure: Placement of a right IJ approach single lumen PowerPort.  Tip is positioned at the superior cavoatrial junction and catheter is ready for immediate use.  Complications: No immediate Recommendations:  - Ok to shower tomorrow - Do not submerge for 7 days - Routine line care   Signed,  Rien Marland K. Aftin Lye, MD   

## 2016-11-09 ENCOUNTER — Other Ambulatory Visit: Payer: Medicare HMO | Admitting: Urology

## 2016-11-09 ENCOUNTER — Inpatient Hospital Stay (HOSPITAL_BASED_OUTPATIENT_CLINIC_OR_DEPARTMENT_OTHER): Payer: Medicare HMO | Admitting: Oncology

## 2016-11-09 ENCOUNTER — Other Ambulatory Visit: Payer: Self-pay

## 2016-11-09 ENCOUNTER — Inpatient Hospital Stay: Payer: Medicare HMO

## 2016-11-09 ENCOUNTER — Encounter: Payer: Self-pay | Admitting: Oncology

## 2016-11-09 ENCOUNTER — Ambulatory Visit: Payer: Medicare HMO

## 2016-11-09 VITALS — BP 164/74 | HR 76 | Temp 96.9°F | Resp 18 | Wt 187.9 lb

## 2016-11-09 DIAGNOSIS — C689 Malignant neoplasm of urinary organ, unspecified: Secondary | ICD-10-CM

## 2016-11-09 DIAGNOSIS — I129 Hypertensive chronic kidney disease with stage 1 through stage 4 chronic kidney disease, or unspecified chronic kidney disease: Secondary | ICD-10-CM

## 2016-11-09 DIAGNOSIS — G893 Neoplasm related pain (acute) (chronic): Secondary | ICD-10-CM

## 2016-11-09 DIAGNOSIS — C801 Malignant (primary) neoplasm, unspecified: Secondary | ICD-10-CM

## 2016-11-09 DIAGNOSIS — Z79899 Other long term (current) drug therapy: Secondary | ICD-10-CM

## 2016-11-09 DIAGNOSIS — Z905 Acquired absence of kidney: Secondary | ICD-10-CM

## 2016-11-09 DIAGNOSIS — C7900 Secondary malignant neoplasm of unspecified kidney and renal pelvis: Secondary | ICD-10-CM

## 2016-11-09 DIAGNOSIS — C679 Malignant neoplasm of bladder, unspecified: Secondary | ICD-10-CM | POA: Diagnosis not present

## 2016-11-09 DIAGNOSIS — Z5112 Encounter for antineoplastic immunotherapy: Secondary | ICD-10-CM

## 2016-11-09 DIAGNOSIS — N189 Chronic kidney disease, unspecified: Secondary | ICD-10-CM

## 2016-11-09 DIAGNOSIS — Z5111 Encounter for antineoplastic chemotherapy: Secondary | ICD-10-CM | POA: Diagnosis not present

## 2016-11-09 DIAGNOSIS — R1030 Lower abdominal pain, unspecified: Secondary | ICD-10-CM

## 2016-11-09 HISTORY — PX: IR FLUORO GUIDE PORT INSERTION RIGHT: IMG5741

## 2016-11-09 LAB — CBC WITH DIFFERENTIAL/PLATELET
BASOS PCT: 1 %
Basophils Absolute: 0.1 10*3/uL (ref 0–0.1)
Eosinophils Absolute: 0.3 10*3/uL (ref 0–0.7)
Eosinophils Relative: 5 %
HEMATOCRIT: 32.9 % — AB (ref 35.0–47.0)
HEMOGLOBIN: 10.7 g/dL — AB (ref 12.0–16.0)
LYMPHS ABS: 1.5 10*3/uL (ref 1.0–3.6)
LYMPHS PCT: 24 %
MCH: 25.8 pg — AB (ref 26.0–34.0)
MCHC: 32.5 g/dL (ref 32.0–36.0)
MCV: 79.3 fL — AB (ref 80.0–100.0)
MONO ABS: 0.2 10*3/uL (ref 0.2–0.9)
MONOS PCT: 4 %
NEUTROS ABS: 4.3 10*3/uL (ref 1.4–6.5)
Neutrophils Relative %: 66 %
Platelets: 350 10*3/uL (ref 150–440)
RBC: 4.15 MIL/uL (ref 3.80–5.20)
RDW: 17.4 % — AB (ref 11.5–14.5)
WBC: 6.5 10*3/uL (ref 3.6–11.0)

## 2016-11-09 LAB — COMPREHENSIVE METABOLIC PANEL
ALBUMIN: 3.1 g/dL — AB (ref 3.5–5.0)
ALK PHOS: 77 U/L (ref 38–126)
ALT: 8 U/L — ABNORMAL LOW (ref 14–54)
ANION GAP: 5 (ref 5–15)
AST: 11 U/L — ABNORMAL LOW (ref 15–41)
BILIRUBIN TOTAL: 0.5 mg/dL (ref 0.3–1.2)
BUN: 14 mg/dL (ref 6–20)
CALCIUM: 8.6 mg/dL — AB (ref 8.9–10.3)
CO2: 25 mmol/L (ref 22–32)
Chloride: 105 mmol/L (ref 101–111)
Creatinine, Ser: 1.04 mg/dL — ABNORMAL HIGH (ref 0.44–1.00)
GFR calc Af Amer: >60 mL/min (ref >60)
GFR, EST NON AFRICAN AMERICAN: 54 mL/min — AB (ref >60)
Glucose, Bld: 187 mg/dL — ABNORMAL HIGH (ref 65–99)
Potassium: 4.3 mmol/L (ref 3.5–5.1)
Sodium: 135 mmol/L (ref 135–145)
TOTAL PROTEIN: 6.4 g/dL — AB (ref 6.5–8.1)

## 2016-11-09 LAB — TSH: TSH: 0.42 u[IU]/mL (ref 0.350–4.500)

## 2016-11-09 MED ORDER — PEMBROLIZUMAB CHEMO INJECTION 100 MG/4ML
200.0000 mg | Freq: Once | INTRAVENOUS | Status: AC
  Administered 2016-11-09: 200 mg via INTRAVENOUS
  Filled 2016-11-09: qty 8

## 2016-11-09 MED ORDER — SODIUM CHLORIDE 0.9 % IV SOLN
Freq: Once | INTRAVENOUS | Status: AC
  Administered 2016-11-09: 11:00:00 via INTRAVENOUS
  Filled 2016-11-09: qty 1000

## 2016-11-09 MED ORDER — SODIUM CHLORIDE 0.9% FLUSH
10.0000 mL | INTRAVENOUS | Status: DC | PRN
  Administered 2016-11-09: 10 mL via INTRAVENOUS
  Filled 2016-11-09: qty 10

## 2016-11-09 MED ORDER — HEPARIN SOD (PORK) LOCK FLUSH 100 UNIT/ML IV SOLN
500.0000 [IU] | Freq: Once | INTRAVENOUS | Status: AC
  Administered 2016-11-09: 500 [IU] via INTRAVENOUS
  Filled 2016-11-09: qty 5

## 2016-11-09 NOTE — Progress Notes (Signed)
Here for follow up

## 2016-11-09 NOTE — Progress Notes (Signed)
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 Erlene Quan in March 2018. In late February 2018 patient presented with acute onset left-sided flank pain and was found to have a large 6 x 6.6 x 6 1 cm mid to left upper pole renal mass on which was directly fading the left adrenal gland measuring 3.1 x 3.9 cm. There was no obvious renal vein invasion. No intra-abdominal adenopathy was noted and chest x-ray did not reveal any obvious pulmonary disease  2. Patient underwent left radical nephrectomy on 10/02/2016. During the time of surgery the mass appeared incredibly fibrotic, desmoplastic reaction was noted in the medial aspect of the kidney in the hilar area involving the upper part of the hilum and the adrenal gland. There was also concern for renal vein invasion. Distal ureterectomy and lymph node dissection was not performed thinking that this was possibly a renal cell carcinoma.  3. DIAGNOSIS:  A. LEFT KIDNEY; ROBOTIC-ASSISTED LAPAROSCOPIC RADICAL NEPHRECTOMY:  - UROTHELIAL CARCINOMA, HIGH-GRADE.  - LYMPHOVASCULAR AND PERINEURAL INVASION PRESENT.  - THE HILAR MARGIN IS POSITIVE.  - THE URETERAL AND GEROTA'S FASCIA MARGINS ARE NEGATIVE.  - SEE SUMMARY BELOW.   Surgical Pathology Cancer Case Summary   RENAL PELVIS AND URETER:  Procedure: Robotic-assisted laparoscopic radical nephrectomy  Specimen Laterality: Left  Tumor Site: Hilum, upper and lower poles  Histologic Type: Urothelial carcinoma  Histologic Grade:  High grade  Tumor Extension: Tumor invades adjacent organs (adrenal gland) and  through the kidney into the perinephric fat  Margins: Hilar margin positive; ureteral and Gerota's fasciamargins  negative  Lymphovascular Invasion: Present  Regional Lymph Nodes: No lymph nodes submitted or found  Pathologic Stage Classification (pTNM, AJCC 8th Edition): pT4 pNX  TNM Descriptors: Not applicable  4. Discussed patient's case with Dr. Erlene Quan urology. She does not think that repeat surgery for distal ureterecomy and LN dissection can be performed given patients performance status and comorbidities. Patient lives with her sister and has limited mobility around the house. She is still complaining of pain the surgical site.   5. Case discussed at tumor board and pathology slides and images reviewed. Repeat ct chest/abdomen does not reveal gross disease post surgery. However, operative findings suggest locally advanced carcinoma with positive margins suggestive of residual disease. LN not assessed although likely involved making this stage IV   Interval history- continues to have post operative diffuse abdominal pain. She is using about 4 doses of oxycodone with some relief. Does not ambulate much  ECOG PS- 2-3 Pain scale- 6 Opioid associated constipation- yes  Review of systems- Review of Systems  Constitutional: Positive for malaise/fatigue. Negative for chills, fever and weight loss.  HENT: Negative for congestion, ear discharge and nosebleeds.   Eyes: Negative for blurred vision.  Respiratory: Negative for cough, hemoptysis, sputum production, shortness of breath and wheezing.   Cardiovascular: Negative for chest pain, palpitations, orthopnea and claudication.  Gastrointestinal: Positive for abdominal pain. Negative for blood in stool, constipation, diarrhea, heartburn, melena, nausea and vomiting.  Genitourinary: Negative for dysuria, flank pain, frequency, hematuria  and urgency.    Musculoskeletal: Negative for back pain, joint pain and myalgias.  Skin: Negative for rash.  Neurological: Negative for dizziness, tingling, focal weakness, seizures, weakness and headaches.  Endo/Heme/Allergies: Does not bruise/bleed easily.  Psychiatric/Behavioral: Negative for depression and suicidal ideas. The patient does not have insomnia.       Allergies  Allergen Reactions  . Norco [Hydrocodone-Acetaminophen] Other (See Comments)    hallucinations hallucinations  . Codeine Phosphate Other (See Comments)    REACTION: unspecified  . Duloxetine Other (See Comments)    REACTION: unspecified  . Meperidine Hcl Other (See Comments)    REACTION: unspecified  . Venlafaxine Other (See Comments)    REACTION: unspecified  . Doxycycline Hives and Rash     Past Medical History:  Diagnosis Date  . Adenomatous polyp 1999  . Anxiety   . Arthritis    Left knee  . CAD (coronary artery disease) of artery bypass graft    a. NSTEMI in 07/2015: Cath showed 99% stenosis of distal LCx  --> DES placed  . Depression    Paranoid tendencies  . Diabetes mellitus    Type II  . Diverticulosis   . GERD (gastroesophageal reflux disease)   . Hyperlipidemia   . Hypertension   . Hypothyroidism   . Myocardial infarction [facility]) 2017   2016 per pt  . Obesity      Past Surgical History:  Procedure Laterality Date  . ABDOMINAL HYSTERECTOMY  1977   One ovary left  . APPENDECTOMY    . BLADDER REPAIR  1977   Tacking  . CARDIAC CATHETERIZATION N/A 08/01/2015   Procedure: Left Heart Cath and Coronary Angiography;  Surgeon: Wellington Hampshire, MD;  Location: [place] [facility];  Service: Cardiovascular;  Laterality: N/A;  . CARDIAC CATHETERIZATION N/A 08/01/2015   Procedure: Coronary Stent Intervention;  Surgeon: Wellington Hampshire, MD;  Location: [place] [facility];  Service: Cardiovascular;  Laterality: N/A;  . CARDIOVASCULAR STRESS TEST  7/00   negative. No cardiolite  . CAROTID ENDARTERECTOMY   2/11   Dr Jamal Collin  . [place]  . CHOLECYSTECTOMY    . CORONARY ARTERY BYPASS GRAFT     2017  . CT ABD W & PELVIS WO CM  2002   Negative. Pituitary normal on MRI  . HEEL SPUR SURGERY  03/03   Left  . INTRAMEDULLARY (IM) NAIL INTERTROCHANTERIC Left 08/04/2015   Procedure: INTRAMEDULLARY (IM) NAIL INTERTROCHANTRIC;  Surgeon: Hessie Knows, MD;  Location: [facility];  Service: Orthopedics;  Laterality: Left;  . KNEE ARTHROSCOPY  2001   Left   Tamala Julian)  . RENAL MASS EXCISION Left april10/18   per pt left kidney removed -cancerous per pt /family  . ROBOT ASSISTED LAPAROSCOPIC NEPHRECTOMY Left 10/02/2016   Procedure: ROBOTIC ASSISTED LAPAROSCOPIC RADICAL NEPHRECTOMY;  Surgeon: Hollice Espy, MD;  Location: [facility];  Service: Urology;  Laterality: Left;  . TEE WITHOUT CARDIOVERSION N/A 04/11/2016   Procedure: TRANSESOPHAGEAL ECHOCARDIOGRAM (TEE);  Surgeon: Teodoro Spray, MD;  Location: [facility];  Service: Cardiovascular;  Laterality: N/A;  . TOTAL KNEE ARTHROPLASTY  11/12   left--Dr Tamala Julian  . TRANSTHORACIC ECHOCARDIOGRAM  3/04   normal    Social History   Social History  . Marital status: Widowed    Spouse name: N/A  . Number of children: 3  . Years of education: N/A   Occupational History  . Receptionist---retired     [place]  .     [employer]  Social History Main Topics  . Smoking status: Former Smoker    Packs/day: 2.00    Years: 15.00    Types: Cigarettes    Quit date: 09/06/1986  . Smokeless tobacco: Never Used  . Alcohol use No  . Drug use: No  . Sexual activity: No   Other Topics Concern  . Not on file   Social History Narrative   Widowed 07/02.  Kids have moved out and now living with friend to share expenses.      3 caffeine drinks daily     Family History  Problem Relation Age of Onset  . Cancer Mother        Breast  . Heart disease Mother   . Diabetes Mother   . Colon polyps Mother   . Heart disease Father   . Diabetes Father    . Hypertension Sister   . Diabetes Sister   . Depression Daughter   . Cancer Paternal Grandmother        ? throat CA  . Colon cancer Unknown        Unsure if mother had it   . Rheum arthritis Sister   . Bladder Cancer Neg Hx   . Kidney cancer Neg Hx      Current Outpatient Prescriptions:  .  ACCU-CHEK SMARTVIEW test strip, , Disp: , Rfl:  .  atorvastatin (LIPITOR) 40 MG tablet, Take 40 mg by mouth daily., Disp: , Rfl:  .  cetirizine (ZYRTEC) 10 MG tablet, Take 10 mg by mouth daily., Disp: , Rfl:  .  citalopram (CELEXA) 40 MG tablet, Take 40 mg by mouth daily. In am., Disp: , Rfl:  .  clopidogrel (PLAVIX) 75 MG tablet, Take 75 mg by mouth daily., Disp: , Rfl:  .  docusate sodium (COLACE) 100 MG capsule, Take 1 capsule (100 mg total) by mouth 2 (two) times daily., Disp: 60 capsule, Rfl: 0 .  hydrochlorothiazide (HYDRODIURIL) 25 MG tablet, Take 25 mg by mouth daily., Disp: , Rfl:  .  insulin aspart (NOVOLOG) 100 UNIT/ML FlexPen, Inject into the skin., Disp: , Rfl:  .  insulin glargine (LANTUS) 100 UNIT/ML injection, Inject 24 Units into the skin at bedtime., Disp: , Rfl:  .  insulin lispro (HUMALOG) 100 UNIT/ML injection, Inject 10 Units into the skin once., Disp: , Rfl:  .  levothyroxine (SYNTHROID, LEVOTHROID) 100 MCG tablet, Take 1 tablet (100 mcg total) by mouth daily., Disp: 90 tablet, Rfl: 1 .  lidocaine-prilocaine (EMLA) cream, Apply to affected area once, Disp: 30 g, Rfl: 3 .  lisinopril (PRINIVIL,ZESTRIL) 20 MG tablet, Take 20 mg by mouth daily., Disp: , Rfl:  .  metoprolol tartrate (LOPRESSOR) 25 MG tablet, TAKE 1 TABLET TWICE DAILY (Patient taking differently: 1 tablet in am.), Disp: 180 tablet, Rfl: 3 .  Oxycodone HCl 10 MG TABS, Take 1 tablet (10 mg total) by mouth every 4 (four) hours as needed., Disp: 60 tablet, Rfl: 0 .  prochlorperazine (COMPAZINE) 10 MG tablet, Take 1 tablet (10 mg total) by mouth every 6 (six) hours as needed (Nausea or vomiting)., Disp: 30 tablet, Rfl:  1 .  traZODone (DESYREL) 100 MG tablet, Take 100 mg by mouth at bedtime., Disp: , Rfl:  .  aspirin EC 81 MG tablet, Take 1 tablet (81 mg total) by mouth daily. (Patient not taking: Reported on 11/09/2016), Disp: 90 tablet, Rfl: 3 .  LORazepam (ATIVAN) 0.5 MG tablet, Take 1 tablet (0.5 mg total) by mouth every 6 (six) hours as  needed (Nausea or vomiting). (Patient not taking: Reported on 11/09/2016), Disp: 30 tablet, Rfl: 0 .  ondansetron (ZOFRAN) 4 MG tablet, Take 1 tablet (4 mg total) by mouth every 6 (six) hours as needed for nausea or vomiting. (Patient not taking: Reported on 11/09/2016), Disp: 30 tablet, Rfl: 0 .  ondansetron (ZOFRAN) 8 MG tablet, Take 1 tablet (8 mg total) by mouth 2 (two) times daily as needed (Nausea or vomiting). (Patient not taking: Reported on 11/09/2016), Disp: 30 tablet, Rfl: 1 No current facility-administered medications for this visit.   Facility-Administered Medications Ordered in Other Visits:  .  heparin lock flush 100 unit/mL, 500 Units, Intravenous, Once, Randa Evens C, MD .  sodium chloride flush (NS) 0.9 % injection 10 mL, 10 mL, Intravenous, PRN, Sindy Guadeloupe, MD, 10 mL at 11/09/16 0923  Physical exam:  Vitals:   11/09/16 0959  BP: (!) 164/74  Pulse: 76  Resp: 18  Temp: (!) 96.9 F (36.1 C)  TempSrc: Tympanic  Weight: 187 lb 14.4 oz (85.2 kg)   Physical Exam  Constitutional: She is oriented to person, place, and time.  Sitting in a wheelchair. No acute distress  HENT:  Head: Normocephalic and atraumatic.  Eyes: EOM are normal. Pupils are equal, round, and reactive to light.  Neck: Normal range of motion.  Cardiovascular: Normal rate, regular rhythm and normal heart sounds.   Pulmonary/Chest: Effort normal and breath sounds normal.  Abdominal: Soft. Bowel sounds are normal.  Nephrectomy scar well healed. No signs of infection  Musculoskeletal:  Left nee surgery scar  Neurological: She is alert and oriented to person, place, and time.  Skin:  Skin is warm and dry.     CMP Latest Ref Rng & Units 11/09/2016  Glucose 65 - 99 mg/dL 187(H)  BUN 6 - 20 mg/dL 14  Creatinine 0.44 - 1.00 mg/dL 1.04(H)  Sodium 135 - 145 mmol/L 135  Potassium 3.5 - 5.1 mmol/L 4.3  Chloride 101 - 111 mmol/L 105  CO2 22 - 32 mmol/L 25  Calcium 8.9 - 10.3 mg/dL 8.6(L)  Total Protein 6.5 - 8.1 g/dL 6.4(L)  Total Bilirubin 0.3 - 1.2 mg/dL 0.5  Alkaline Phos 38 - 126 U/L 77  AST 15 - 41 U/L 11(L)  ALT 14 - 54 U/L 8(L)   CBC Latest Ref Rng & Units 11/09/2016  WBC 3.6 - 11.0 K/uL 6.5  Hemoglobin 12.0 - 16.0 g/dL 10.7(L)  Hematocrit 35.0 - 47.0 % 32.9(L)  Platelets 150 - 440 K/uL 350    No images are attached to the encounter.  Ct Abdomen Pelvis Wo Contrast  Result Date: 10/23/2016 CLINICAL DATA:  Left flank pain and left lower quadrant pain for 3 months. History of nephrectomy for urothelial carcinoma of the kidney. No known injury. History of cholecystectomy and appendectomy. EXAM: CT CHEST, ABDOMEN AND PELVIS WITHOUT CONTRAST TECHNIQUE: Multidetector CT imaging of the chest, abdomen and pelvis was performed following the standard protocol without IV contrast. COMPARISON:  Chest CT July 30, 2015 and CT of the abdomen and pelvis August 19, 2016 FINDINGS: CT CHEST FINDINGS Cardiovascular: The thoracic aorta and central pulmonary arteries are normal. Coronary artery calcifications are noted. The heart is unchanged. Mediastinum/Nodes: No enlarged mediastinal, hilar, or axillary lymph nodes. Thyroid gland, trachea, and esophagus demonstrate no significant findings. Lungs/Pleura: Lungs are clear. No pleural effusion or pneumothorax. CT ABDOMEN PELVIS FINDINGS Hepatobiliary: No focal liver abnormality is seen. Status post cholecystectomy. No biliary dilatation. Pancreas: The pancreatic head, neck, and body are  normal. There is some increased attenuation in the fat around the tail. This increased attenuation could be postsurgical as the tail has fallen posteriorly  into the region of nephrectomy. Spleen: The spleen has been displaced posteriorly and medially into the site of nephrectomy. A small amount of increased attenuation along the posteromedial aspect of the spleen is identified such as on series 2, image 51. There is mild increased attenuation in adjacent fat as well. These changes could all be postsurgical in nature. Adrenals/Urinary Tract: The left adrenal gland and kidney have been removed. Increased attenuation in the region of the nephrectomy bed could be postsurgical. No definitive mass. Mild increased attenuation of fat of this region could also be postsurgical. Pancreatic tail and spleen now occupy the space of recent nephrectomy. The right adrenal gland and kidney are normal. The bladder is normal. The right ureter is nondilated. A small portion of distal left ureter is identified without obvious abnormality. Stomach/Bowel: The stomach and small bowel are normal. Colonic diverticulosis is identified. There is no definitive diverticulitis. Minimal increased attenuation in the fat adjacent to the sigmoid colon would make it difficult to completely exclude a very low grade sigmoid diverticulitis, especially given the patient's symptoms. The remainder of the colon is normal. The patient is status post appendectomy. Vascular/Lymphatic: Atherosclerotic changes are seen in the abdominal aorta and iliac vessels without aneurysm. No adenopathy identified. Reproductive: The patient is status post hysterectomy. The left ovary appears remain with a central calcification, unchanged. Other: No abdominal wall hernia or abnormality. No abdominopelvic ascites. Musculoskeletal: At L1 compression fracture stable. Status post left hip fracture repair. No other bony abnormalities identified. IMPRESSION: 1. The patient is status post left adrenalectomy and left nephrectomy. Increased attenuation in the region of nephrectomy, including in the fat of this region may all be  postoperative. While there is no definitive recurrence, it would be very difficult to exclude a subtle recurrence or residual tumor given history of positive margins and postoperative changes. 2. Sigmoid diverticulosis. There is no definitive diverticulitis. However, it would be difficult to exclude very low-grade subtle diverticulitis, especially given patient's symptoms. 3. The pancreatic tail and spleen now lie within the nephrectomy site. The mild increased attenuation in the adjacent fat may all be postoperative. 4. Atherosclerosis. Electronically Signed   By: Dorise Bullion III M.D   On: 10/23/2016 17:29   Ct Chest Wo Contrast  Result Date: 10/23/2016 CLINICAL DATA:  Left flank pain and left lower quadrant pain for 3 months. History of nephrectomy for urothelial carcinoma of the kidney. No known injury. History of cholecystectomy and appendectomy. EXAM: CT CHEST, ABDOMEN AND PELVIS WITHOUT CONTRAST TECHNIQUE: Multidetector CT imaging of the chest, abdomen and pelvis was performed following the standard protocol without IV contrast. COMPARISON:  Chest CT July 30, 2015 and CT of the abdomen and pelvis August 19, 2016 FINDINGS: CT CHEST FINDINGS Cardiovascular: The thoracic aorta and central pulmonary arteries are normal. Coronary artery calcifications are noted. The heart is unchanged. Mediastinum/Nodes: No enlarged mediastinal, hilar, or axillary lymph nodes. Thyroid gland, trachea, and esophagus demonstrate no significant findings. Lungs/Pleura: Lungs are clear. No pleural effusion or pneumothorax. CT ABDOMEN PELVIS FINDINGS Hepatobiliary: No focal liver abnormality is seen. Status post cholecystectomy. No biliary dilatation. Pancreas: The pancreatic head, neck, and body are normal. There is some increased attenuation in the fat around the tail. This increased attenuation could be postsurgical as the tail has fallen posteriorly into the region of nephrectomy. Spleen: The spleen has been displaced  posteriorly and medially into the site of nephrectomy. A small amount of increased attenuation along the posteromedial aspect of the spleen is identified such as on series 2, image 51. There is mild increased attenuation in adjacent fat as well. These changes could all be postsurgical in nature. Adrenals/Urinary Tract: The left adrenal gland and kidney have been removed. Increased attenuation in the region of the nephrectomy bed could be postsurgical. No definitive mass. Mild increased attenuation of fat of this region could also be postsurgical. Pancreatic tail and spleen now occupy the space of recent nephrectomy. The right adrenal gland and kidney are normal. The bladder is normal. The right ureter is nondilated. A small portion of distal left ureter is identified without obvious abnormality. Stomach/Bowel: The stomach and small bowel are normal. Colonic diverticulosis is identified. There is no definitive diverticulitis. Minimal increased attenuation in the fat adjacent to the sigmoid colon would make it difficult to completely exclude a very low grade sigmoid diverticulitis, especially given the patient's symptoms. The remainder of the colon is normal. The patient is status post appendectomy. Vascular/Lymphatic: Atherosclerotic changes are seen in the abdominal aorta and iliac vessels without aneurysm. No adenopathy identified. Reproductive: The patient is status post hysterectomy. The left ovary appears remain with a central calcification, unchanged. Other: No abdominal wall hernia or abnormality. No abdominopelvic ascites. Musculoskeletal: At L1 compression fracture stable. Status post left hip fracture repair. No other bony abnormalities identified. IMPRESSION: 1. The patient is status post left adrenalectomy and left nephrectomy. Increased attenuation in the region of nephrectomy, including in the fat of this region may all be postoperative. While there is no definitive recurrence, it would be very  difficult to exclude a subtle recurrence or residual tumor given history of positive margins and postoperative changes. 2. Sigmoid diverticulosis. There is no definitive diverticulitis. However, it would be difficult to exclude very low-grade subtle diverticulitis, especially given patient's symptoms. 3. The pancreatic tail and spleen now lie within the nephrectomy site. The mild increased attenuation in the adjacent fat may all be postoperative. 4. Atherosclerosis. Electronically Signed   By: Dorise Bullion III M.D   On: 10/23/2016 17:29     Assessment and plan- Patient is a 66 y.o. female with high grade locally advanced upper urothelial carcinoma involving renal pelvis likely Stage IV based on operative findings  Counts ok to proceed with cycle 1 of keytruda today. Encouraged patient to be more active and ambulatory  Abdominal pain- post operative. Mainly around incision site and lower diffuse abdominal pain. Recent CT scan revealed no infection. Clinically patient does not appear to have infection. Continue to monitor. Continue prn oxycodone and add prn tylenol alternating with oxy. F/u phone call next week and if pain doesn't improve will consider adding fentanyl patch  RTC in 3 weeks prior to cycle 2 of keytruda   Visit Diagnosis 1. Urothelial cancer ([place])   2. Encounter for antineoplastic immunotherapy   3. Lower abdominal pain      Dr. Randa Evens, MD, MPH [facility] Pager- [phone] 11/09/2016 10:32 AM

## 2016-11-13 ENCOUNTER — Other Ambulatory Visit: Payer: Self-pay | Admitting: *Deleted

## 2016-11-13 MED ORDER — OXYCODONE HCL 10 MG PO TABS: 10.0000 mg | ORAL_TABLET | ORAL | 0 refills | Status: DC | PRN

## 2016-11-20 ENCOUNTER — Telehealth: Payer: Self-pay | Admitting: *Deleted

## 2016-11-20 MED ORDER — FENTANYL 25 MCG/HR TD PT72: 25.0000 ug | MEDICATED_PATCH | TRANSDERMAL | 0 refills | Status: DC

## 2016-11-20 NOTE — Telephone Encounter (Signed)
Oxycodone not controlling pain and is asking for Fentanyl patch Rx discussed at last OV.Please advise

## 2016-11-20 NOTE — Telephone Encounter (Signed)
Per VO Dr Janese Banks, Fentanyl 25 mcg patch # 10. Rx signed by Dr Janese Banks, daughter informed to pick it up in [place] office

## 2016-11-22 IMAGING — CR DG FEMUR 2+V*L*
1 series · 3 of 3 positions shown · non-contrast
Comparison: None.

CLINICAL DATA: Elective surgery.

EXAM:
LEFT FEMUR 2 VIEWS

[Series 6001: 1 · 3 of 3 slices shown]
[im 1/3]
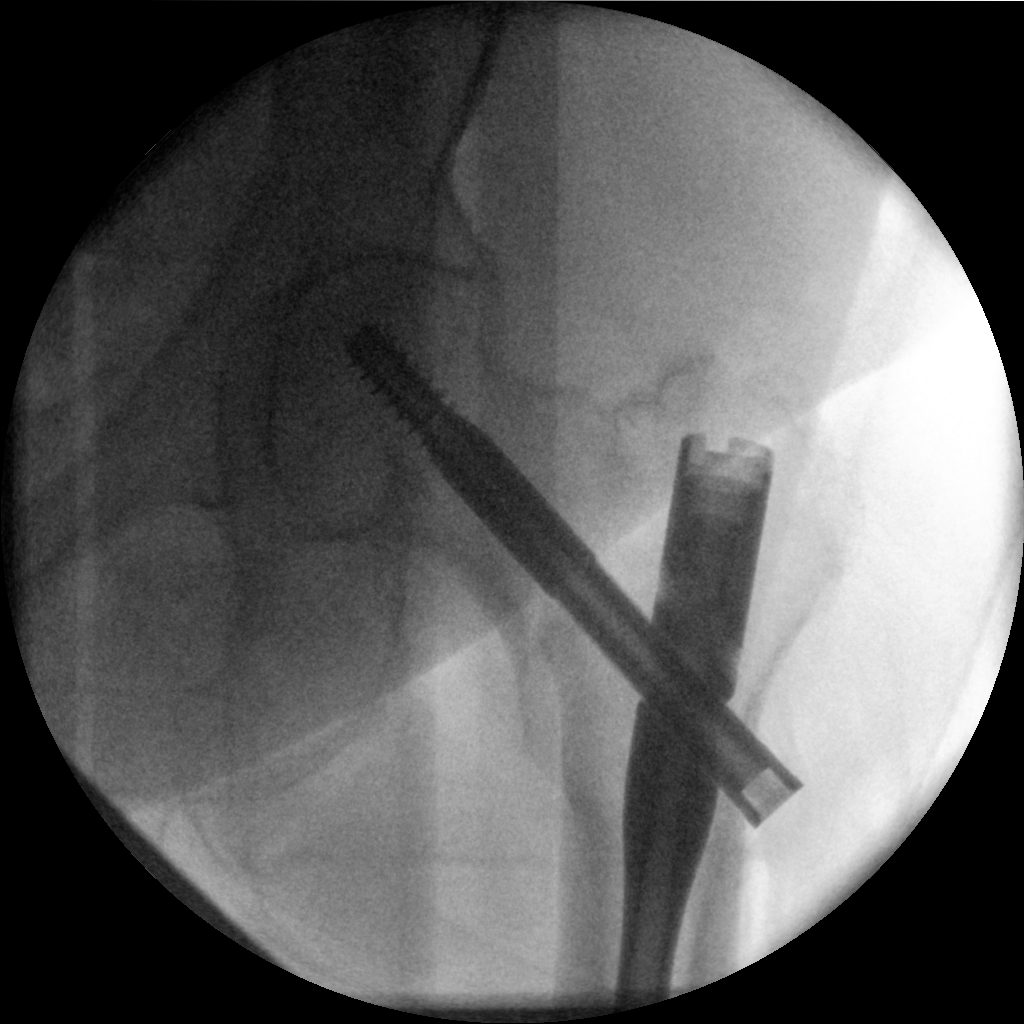
[im 2/3]
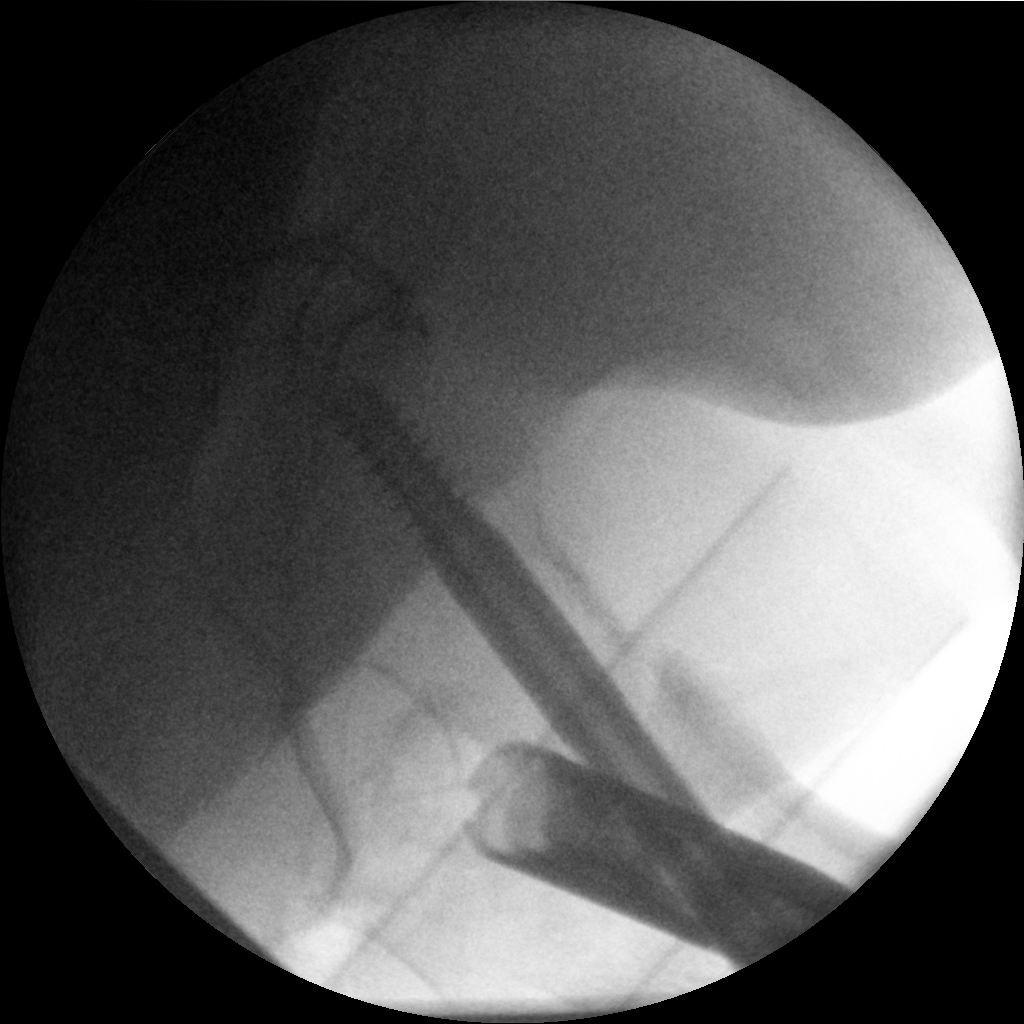
[im 3/3]
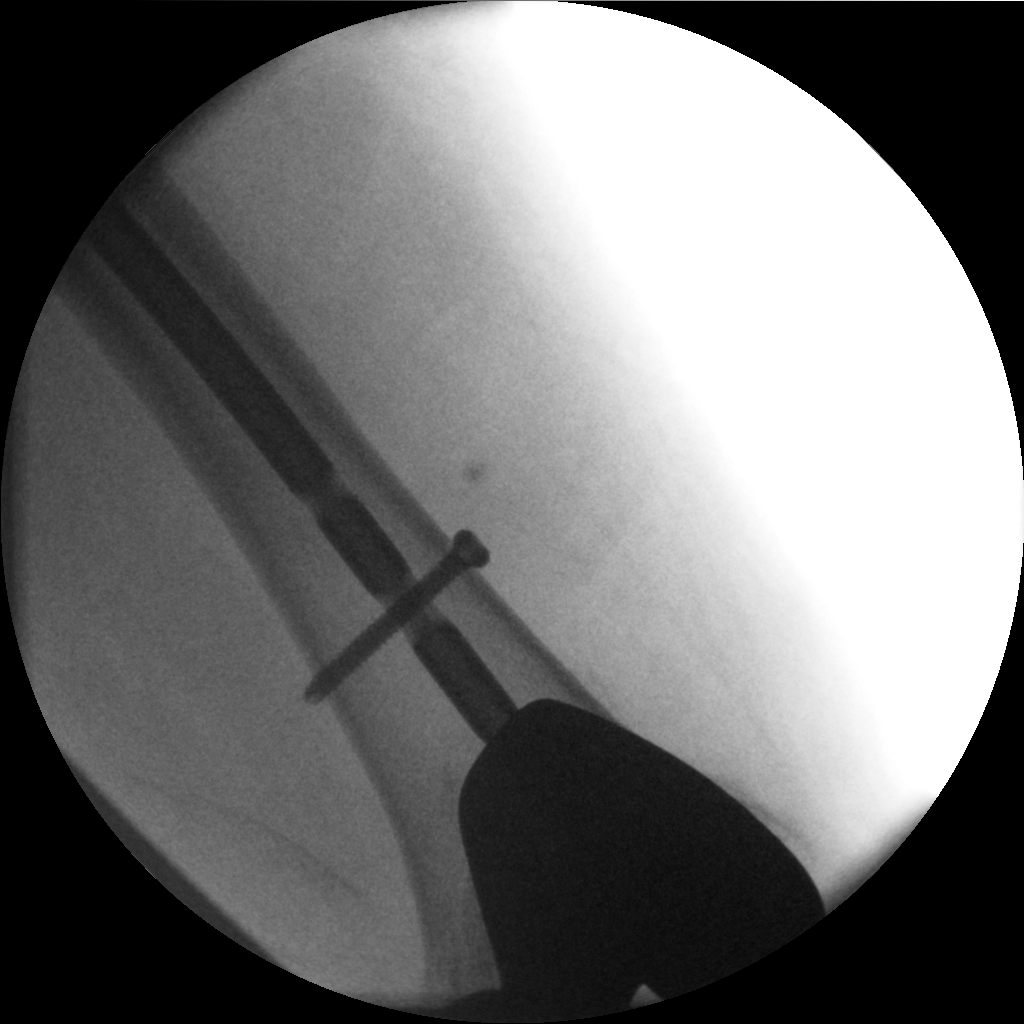

[3 of 3 positions shown; findings below may reference images not displayed]

FINDINGS: There has been a placement of left intra medullary rod with screw
fixation proximally and distally. The alignment is anatomic. Poorly
visualized irregularity of the supra trochanteric femoral neck may
represent a fracture. There is no evidence of immediate
complications.
IMPRESSION: Satisfactory alignment of left femoral intra medullary rod and screw
fixation.

## 2016-11-26 ENCOUNTER — Other Ambulatory Visit: Payer: Self-pay | Admitting: *Deleted

## 2016-11-26 MED ORDER — OXYCODONE HCL 10 MG PO TABS: 10.0000 mg | ORAL_TABLET | ORAL | 0 refills | Status: DC | PRN

## 2016-11-26 NOTE — Telephone Encounter (Signed)
Per VO Dr Grayland Ormond, increase Fentanyl to 50 mcg ok to refill Oxycodone. Daughter informed, she reports that she has 6 patches left and the last one put on day before yesterday. She was advised to remove old patch and place 2 patches on today and to call back Wednesday if no relief

## 2016-11-26 NOTE — Telephone Encounter (Signed)
Daughter called to request a refill of her Oxycodone, states she is using the Fentanyl, but it is not helping at all. Please advise if you want to increase Fentanyl or change to something else. She is currently on 25 mcg Fentanyl and Oxycodone 10 mg Q 4 H PRN

## 2016-11-28 ENCOUNTER — Encounter: Payer: Self-pay | Admitting: Urology

## 2016-11-28 ENCOUNTER — Ambulatory Visit (INDEPENDENT_AMBULATORY_CARE_PROVIDER_SITE_OTHER): Payer: Medicare HMO | Admitting: Urology

## 2016-11-28 VITALS — BP 91/58 | HR 44 | Ht 62.0 in | Wt 189.0 lb

## 2016-11-28 DIAGNOSIS — C689 Malignant neoplasm of urinary organ, unspecified: Secondary | ICD-10-CM

## 2016-11-28 MED ORDER — LIDOCAINE HCL 2 % EX GEL
1.0000 "application " | Freq: Once | CUTANEOUS | Status: AC
  Administered 2016-11-28: 1 via URETHRAL

## 2016-11-28 MED ORDER — CIPROFLOXACIN HCL 500 MG PO TABS
500.0000 mg | ORAL_TABLET | Freq: Once | ORAL | Status: AC
  Administered 2016-11-28: 500 mg via ORAL

## 2016-11-28 NOTE — Progress Notes (Signed)
   11/28/16  CC:  Chief Complaint  Patient presents with  . Cysto    HPI: 66 year old female with T4 upper tract urothelial cancer who underwent left robotic radical nephrectomy on 10/03/16.  Surgical margin was positive and there was concern for aortic involvement with positive renal artery margin.  She now had a port placed and has been started on Keytruda. She is tolerating this well and has her second infusion of Friday.  She returns today for cystoscopy to evaluate her bladder which is not previously performed.  She reports that her incisional pain is improving. She does have pain radiating to her back and right lower quadrant of unclear etiology.  Most recent cross-sectional imaging on 10/23/2016 in the form of CT abdomen without contrast. There is attenuation in the postoperative site, unclear if this represented postoperative changes or residual tumor. Otherwise, there is no obvious metastatic disease.  She's had interval weight loss, no weighing only 189. She's had difficulty eating and swallowing.  Blood pressure (!) 91/58, pulse (!) 44, height 5\' 2"  (1.575 m), weight 189 lb (85.7 kg). NED. A&Ox3.  Accompanied by daughter today. No respiratory distress   Abd soft, NT, ND, obese.  Nephrectomy incisions healing well, no evidence of hernia formation. Normal external genitalia with patent urethral meatus  Cystoscopy Procedure Note  Patient identification was confirmed, informed consent was obtained, and patient was prepped using Betadine solution.  Lidocaine jelly was administered per urethral meatus.    Preoperative abx where received prior to procedure.    Procedure: - Flexible cystoscope introduced, without any difficulty.   - Thorough search of the bladder revealed:    normal urethral meatus    normal urothelium    no stones    no ulcers     no tumors    no urethral polyps    Mild trabeculation  - Ureteral orifices were normal in position and  appearance.  Post-Procedure: - Patient tolerated the procedure well  Assessment/ Plan:  1. Urothelial carcinoma (HCC) NED in bladder today Given her locally advanced disease with positive margins, her overall prognosis is poor Currently managed on immunotherapy by Dr. Janese Banks No further urological surveillance or intervention warranted at this time, she can continue to follow up with medical oncology and return when necessary - Urinalysis, Complete - ciprofloxacin (CIPRO) tablet 500 mg; Take 1 tablet (500 mg total) by mouth once. - lidocaine (XYLOCAINE) 2 % jelly 1 application; Place 1 application into the urethra once.   Hollice Espy, MD

## 2016-11-29 LAB — URINALYSIS, COMPLETE
BILIRUBIN UA: NEGATIVE
GLUCOSE, UA: NEGATIVE
LEUKOCYTES UA: NEGATIVE
Nitrite, UA: NEGATIVE
PROTEIN UA: NEGATIVE
RBC UA: NEGATIVE
SPEC GRAV UA: 1.015 (ref 1.005–1.030)
Urobilinogen, Ur: 0.2 mg/dL (ref 0.2–1.0)
pH, UA: 5.5 (ref 5.0–7.5)

## 2016-11-29 LAB — MICROSCOPIC EXAMINATION
RBC, UA: NONE SEEN /hpf (ref 0–?)
WBC, UA: NONE SEEN /hpf (ref 0–?)

## 2016-11-30 ENCOUNTER — Inpatient Hospital Stay: Payer: Medicare HMO

## 2016-11-30 ENCOUNTER — Telehealth: Payer: Self-pay | Admitting: *Deleted

## 2016-11-30 ENCOUNTER — Inpatient Hospital Stay: Payer: Medicare HMO | Attending: Oncology | Admitting: Oncology

## 2016-11-30 VITALS — BP 117/62 | HR 48 | Temp 98.6°F | Wt 185.1 lb

## 2016-11-30 DIAGNOSIS — E669 Obesity, unspecified: Secondary | ICD-10-CM | POA: Insufficient documentation

## 2016-11-30 DIAGNOSIS — D509 Iron deficiency anemia, unspecified: Secondary | ICD-10-CM | POA: Diagnosis not present

## 2016-11-30 DIAGNOSIS — C642 Malignant neoplasm of left kidney, except renal pelvis: Secondary | ICD-10-CM | POA: Insufficient documentation

## 2016-11-30 DIAGNOSIS — Z5112 Encounter for antineoplastic immunotherapy: Secondary | ICD-10-CM

## 2016-11-30 DIAGNOSIS — R001 Bradycardia, unspecified: Secondary | ICD-10-CM | POA: Insufficient documentation

## 2016-11-30 DIAGNOSIS — R109 Unspecified abdominal pain: Secondary | ICD-10-CM | POA: Diagnosis not present

## 2016-11-30 DIAGNOSIS — E785 Hyperlipidemia, unspecified: Secondary | ICD-10-CM | POA: Diagnosis not present

## 2016-11-30 DIAGNOSIS — G893 Neoplasm related pain (acute) (chronic): Secondary | ICD-10-CM

## 2016-11-30 DIAGNOSIS — I10 Essential (primary) hypertension: Secondary | ICD-10-CM | POA: Insufficient documentation

## 2016-11-30 DIAGNOSIS — I25810 Atherosclerosis of coronary artery bypass graft(s) without angina pectoris: Secondary | ICD-10-CM | POA: Insufficient documentation

## 2016-11-30 DIAGNOSIS — Z79899 Other long term (current) drug therapy: Secondary | ICD-10-CM | POA: Diagnosis not present

## 2016-11-30 DIAGNOSIS — K219 Gastro-esophageal reflux disease without esophagitis: Secondary | ICD-10-CM | POA: Insufficient documentation

## 2016-11-30 DIAGNOSIS — E1165 Type 2 diabetes mellitus with hyperglycemia: Secondary | ICD-10-CM | POA: Diagnosis not present

## 2016-11-30 DIAGNOSIS — I252 Old myocardial infarction: Secondary | ICD-10-CM | POA: Diagnosis not present

## 2016-11-30 DIAGNOSIS — Z5111 Encounter for antineoplastic chemotherapy: Secondary | ICD-10-CM | POA: Diagnosis not present

## 2016-11-30 DIAGNOSIS — C689 Malignant neoplasm of urinary organ, unspecified: Secondary | ICD-10-CM

## 2016-11-30 DIAGNOSIS — Z87891 Personal history of nicotine dependence: Secondary | ICD-10-CM | POA: Insufficient documentation

## 2016-11-30 DIAGNOSIS — Z955 Presence of coronary angioplasty implant and graft: Secondary | ICD-10-CM | POA: Diagnosis not present

## 2016-11-30 DIAGNOSIS — Z794 Long term (current) use of insulin: Secondary | ICD-10-CM | POA: Diagnosis not present

## 2016-11-30 DIAGNOSIS — Z7982 Long term (current) use of aspirin: Secondary | ICD-10-CM | POA: Insufficient documentation

## 2016-11-30 DIAGNOSIS — F329 Major depressive disorder, single episode, unspecified: Secondary | ICD-10-CM | POA: Insufficient documentation

## 2016-11-30 DIAGNOSIS — F419 Anxiety disorder, unspecified: Secondary | ICD-10-CM | POA: Diagnosis not present

## 2016-11-30 DIAGNOSIS — E039 Hypothyroidism, unspecified: Secondary | ICD-10-CM | POA: Insufficient documentation

## 2016-11-30 DIAGNOSIS — Z905 Acquired absence of kidney: Secondary | ICD-10-CM | POA: Diagnosis not present

## 2016-11-30 LAB — URINALYSIS, COMPLETE (UACMP) WITH MICROSCOPIC
Bacteria, UA: NONE SEEN
Bilirubin Urine: NEGATIVE
GLUCOSE, UA: NEGATIVE mg/dL
Hgb urine dipstick: NEGATIVE
Ketones, ur: NEGATIVE mg/dL
Leukocytes, UA: NEGATIVE
Nitrite: NEGATIVE
PROTEIN: NEGATIVE mg/dL
SPECIFIC GRAVITY, URINE: 1.003 — AB (ref 1.005–1.030)
pH: 7 (ref 5.0–8.0)

## 2016-11-30 LAB — COMPREHENSIVE METABOLIC PANEL
ALK PHOS: 67 U/L (ref 38–126)
ALT: 7 U/L — AB (ref 14–54)
AST: 19 U/L (ref 15–41)
Albumin: 3.1 g/dL — ABNORMAL LOW (ref 3.5–5.0)
Anion gap: 6 (ref 5–15)
BUN: 14 mg/dL (ref 6–20)
CHLORIDE: 97 mmol/L — AB (ref 101–111)
CO2: 28 mmol/L (ref 22–32)
CREATININE: 1.36 mg/dL — AB (ref 0.44–1.00)
Calcium: 8.6 mg/dL — ABNORMAL LOW (ref 8.9–10.3)
GFR calc Af Amer: 46 mL/min — ABNORMAL LOW (ref >60)
GFR, EST NON AFRICAN AMERICAN: 39 mL/min — AB (ref >60)
Glucose, Bld: 94 mg/dL (ref 65–99)
Potassium: 3.9 mmol/L (ref 3.5–5.1)
Sodium: 131 mmol/L — ABNORMAL LOW (ref 135–145)
Total Bilirubin: 0.4 mg/dL (ref 0.3–1.2)
Total Protein: 6 g/dL — ABNORMAL LOW (ref 6.5–8.1)

## 2016-11-30 LAB — CBC WITH DIFFERENTIAL/PLATELET
Basophils Absolute: 0.1 10*3/uL (ref 0–0.1)
Basophils Relative: 1 %
EOS PCT: 4 %
Eosinophils Absolute: 0.2 10*3/uL (ref 0–0.7)
HCT: 32.1 % — ABNORMAL LOW (ref 35.0–47.0)
Hemoglobin: 10.7 g/dL — ABNORMAL LOW (ref 12.0–16.0)
LYMPHS ABS: 1.3 10*3/uL (ref 1.0–3.6)
LYMPHS PCT: 23 %
MCH: 26.2 pg (ref 26.0–34.0)
MCHC: 33.3 g/dL (ref 32.0–36.0)
MCV: 78.6 fL — AB (ref 80.0–100.0)
MONO ABS: 0.3 10*3/uL (ref 0.2–0.9)
MONOS PCT: 6 %
Neutro Abs: 3.8 10*3/uL (ref 1.4–6.5)
Neutrophils Relative %: 66 %
PLATELETS: 273 10*3/uL (ref 150–440)
RBC: 4.08 MIL/uL (ref 3.80–5.20)
RDW: 17.4 % — AB (ref 11.5–14.5)
WBC: 5.7 10*3/uL (ref 3.6–11.0)

## 2016-11-30 MED ORDER — HEPARIN SOD (PORK) LOCK FLUSH 100 UNIT/ML IV SOLN
500.0000 [IU] | Freq: Once | INTRAVENOUS | Status: AC | PRN
  Administered 2016-11-30: 500 [IU]
  Filled 2016-11-30: qty 5

## 2016-11-30 MED ORDER — OXYCODONE HCL ER 20 MG PO T12A: 20.0000 mg | EXTENDED_RELEASE_TABLET | Freq: Two times a day (BID) | ORAL | 0 refills | Status: DC

## 2016-11-30 MED ORDER — LORAZEPAM 0.5 MG PO TABS: 0.5000 mg | ORAL_TABLET | Freq: Four times a day (QID) | ORAL | 0 refills | Status: DC | PRN

## 2016-11-30 MED ORDER — SODIUM CHLORIDE 0.9 % IV SOLN
Freq: Once | INTRAVENOUS | Status: AC
  Administered 2016-11-30: 11:00:00 via INTRAVENOUS
  Filled 2016-11-30: qty 1000

## 2016-11-30 MED ORDER — SODIUM CHLORIDE 0.9 % IV SOLN
200.0000 mg | Freq: Once | INTRAVENOUS | Status: AC
  Administered 2016-11-30: 200 mg via INTRAVENOUS
  Filled 2016-11-30: qty 8

## 2016-11-30 NOTE — Progress Notes (Signed)
Patient here today for follow up.  Patient c/o fentanyl patches making her itch, unable to keep them on for 3 days.

## 2016-11-30 NOTE — Progress Notes (Signed)
Hematology/Oncology Consult note [facility]  [phone] [phone]  Patient Care Team: Leonel Ramsay, MD as PCP - General (Infectious Diseases)   Name of the patient: Anita Small  [id number]  03/19/1951   Date of visit: 11/30/16  Diagnosis- locally advanced likely stage IV upper urothelial carcinoma  Chief complaint/ Reason for visit- on treatment assessment prior to cycle 2 of keytruda 1st line  Heme/Onc history: Patient is a 66 year old female who was seen by Dr. Erlene Quan in March 2018. In late February 2018 patient presented with acute onset left-sided flank pain and was found to have a large 6 x 6.6 x 6 1 cm mid to left upper pole renal mass on which was directly fading the left adrenal gland measuring 3.1 x 3.9 cm. There was no obvious renal vein invasion. No intra-abdominal adenopathy was noted and chest x-ray did not reveal any obvious pulmonary disease  2. Patient underwent left radical nephrectomy on 10/02/2016. During the time of surgery the mass appeared incredibly fibrotic, desmoplastic reaction was noted in the medial aspect of the kidney in the hilar area involving the upper part of the hilum and the adrenal gland. There was also concern for renal vein invasion. Distal ureterectomy and lymph node dissection was not performed thinking that this was possibly a renal cell carcinoma.  3. DIAGNOSIS:  A. LEFT KIDNEY; ROBOTIC-ASSISTED LAPAROSCOPIC RADICAL NEPHRECTOMY:  - UROTHELIAL CARCINOMA, HIGH-GRADE.  - LYMPHOVASCULAR AND PERINEURAL INVASION PRESENT.  - THE HILAR MARGIN IS POSITIVE.  - THE URETERAL AND GEROTA'S FASCIA MARGINS ARE NEGATIVE.  - SEE SUMMARY BELOW.   Surgical Pathology Cancer Case Summary   RENAL PELVIS AND URETER:  Procedure: Robotic-assisted laparoscopic radical nephrectomy  Specimen Laterality: Left  Tumor Site: Hilum, upper and lower poles  Histologic Type: Urothelial carcinoma  Histologic  Grade: High grade  Tumor Extension: Tumor invades adjacent organs (adrenal gland) and  through the kidney into the perinephric fat  Margins: Hilar margin positive; ureteral and Gerota's fasciamargins  negative  Lymphovascular Invasion: Present  Regional Lymph Nodes: No lymph nodes submitted or found  Pathologic Stage Classification (pTNM, AJCC 8th Edition): pT4 pNX  TNM Descriptors: Not applicable  4. Discussed patient's case with Dr. Erlene Quan urology. She does not think that repeat surgery for distal ureterecomy and LN dissection can be performed given patients performance status and comorbidities. Patient lives with her sister and has limited mobility around the house. She is still complaining of abdominal pain which is persistent and of unclear etiology  5. Case discussed at tumor board and pathology slides and images reviewed. Repeat ct chest/abdomen does not reveal gross disease post surgery. However, operative findings suggest locally advanced carcinoma with positive margins suggestive of residual disease. LN not assessed although likely involved making this stage IV. Cystoscopy in may 2018 was NED   Interval history- continues to have abdominal pain. Today she states that pain is more along RLQ and radiates to right flank. She has been using 4-6 doses of oxycodone without relief. She has been unable to keep her fenatnyl patch on and requests alternative pain medication. She does not do much around the house./ spends most of the time in chair. Needs assistance for ADL's  ECOG PS- 2 Pain scale- 9 Opioid associated constipation- no  Review of systems- Review of Systems  Constitutional: Negative for chills, fever, malaise/fatigue and weight loss.  HENT: Negative for congestion, ear discharge and nosebleeds.   Eyes: Negative for blurred vision.  Respiratory: Negative  for cough, hemoptysis, sputum production, shortness of breath and wheezing.   Cardiovascular: Negative for chest pain,  palpitations, orthopnea and claudication.  Gastrointestinal: Negative for abdominal pain, blood in stool, constipation, diarrhea, heartburn, melena, nausea and vomiting.       Right flank pain  Genitourinary: Negative for dysuria, flank pain, frequency, hematuria and urgency.  Musculoskeletal: Negative for back pain, joint pain and myalgias.  Skin: Negative for rash.  Neurological: Negative for dizziness, tingling, focal weakness, seizures, weakness and headaches.  Endo/Heme/Allergies: Does not bruise/bleed easily.  Psychiatric/Behavioral: Negative for depression and suicidal ideas. The patient does not have insomnia.        Allergies  Allergen Reactions  . Norco [Hydrocodone-Acetaminophen] Other (See Comments)    hallucinations hallucinations  . Codeine Phosphate Other (See Comments)    REACTION: unspecified  . Duloxetine Other (See Comments)    REACTION: unspecified  . Meperidine Hcl Other (See Comments)    REACTION: unspecified  . Venlafaxine Other (See Comments)    REACTION: unspecified  . Doxycycline Hives and Rash     Past Medical History:  Diagnosis Date  . Adenomatous polyp 1999  . Anxiety   . Arthritis    Left knee  . CAD (coronary artery disease) of artery bypass graft    a. NSTEMI in 07/2015: Cath showed 99% stenosis of distal LCx  --> DES placed  . Depression    Paranoid tendencies  . Diabetes mellitus    Type II  . Diverticulosis   . GERD (gastroesophageal reflux disease)   . Hyperlipidemia   . Hypertension   . Hypothyroidism   . Myocardial infarction [facility]) 2017   2016 per pt  . Obesity      Past Surgical History:  Procedure Laterality Date  . ABDOMINAL HYSTERECTOMY  1977   One ovary left  . APPENDECTOMY    . BLADDER REPAIR  1977   Tacking  . CARDIAC CATHETERIZATION N/A 08/01/2015   Procedure: Left Heart Cath and Coronary Angiography;  Surgeon: Wellington Hampshire, MD;  Location: [facility];  Service: Cardiovascular;  Laterality: N/A;  .  CARDIAC CATHETERIZATION N/A 08/01/2015   Procedure: Coronary Stent Intervention;  Surgeon: Wellington Hampshire, MD;  Location: [facility];  Service: Cardiovascular;  Laterality: N/A;  . CARDIOVASCULAR STRESS TEST  7/00   negative. No cardiolite  . CAROTID ENDARTERECTOMY  2/11   Dr Jamal Collin  . [place]  . CHOLECYSTECTOMY    . CORONARY ARTERY BYPASS GRAFT     2017  . CT ABD W & PELVIS WO CM  2002   Negative. Pituitary normal on MRI  . HEEL SPUR SURGERY  03/03   Left  . INTRAMEDULLARY (IM) NAIL INTERTROCHANTERIC Left 08/04/2015   Procedure: INTRAMEDULLARY (IM) NAIL INTERTROCHANTRIC;  Surgeon: Hessie Knows, MD;  Location: [facility];  Service: Orthopedics;  Laterality: Left;  . IR FLUORO GUIDE PORT INSERTION RIGHT  11/09/2016  . KNEE ARTHROSCOPY  2001   Left   Tamala Julian)  . RENAL MASS EXCISION Left april10/18   per pt left kidney removed -cancerous per pt /family  . ROBOT ASSISTED LAPAROSCOPIC NEPHRECTOMY Left 10/02/2016   Procedure: ROBOTIC ASSISTED LAPAROSCOPIC RADICAL NEPHRECTOMY;  Surgeon: Hollice Espy, MD;  Location: [facility];  Service: Urology;  Laterality: Left;  . TEE WITHOUT CARDIOVERSION N/A 04/11/2016   Procedure: TRANSESOPHAGEAL ECHOCARDIOGRAM (TEE);  Surgeon: Teodoro Spray, MD;  Location: [facility];  Service: Cardiovascular;  Laterality: N/A;  . TOTAL KNEE ARTHROPLASTY  11/12  left--Dr Tamala Julian  . TRANSTHORACIC ECHOCARDIOGRAM  3/04   normal    Social History   Social History  . Marital status: Widowed    Spouse name: N/A  . Number of children: 3  . Years of education: N/A   Occupational History  . Receptionist---retired     [place]  .     [employer]      Social History Main Topics  . Smoking status: Former Smoker    Packs/day: 2.00    Years: 15.00    Types: Cigarettes    Quit date: 09/06/1986  . Smokeless tobacco: Never Used  . Alcohol use No  . Drug use: No  . Sexual activity: No   Other Topics Concern  . Not on file   Social  History Narrative   Widowed 07/02.  Kids have moved out and now living with friend to share expenses.      3 caffeine drinks daily     Family History  Problem Relation Age of Onset  . Cancer Mother        Breast  . Heart disease Mother   . Diabetes Mother   . Colon polyps Mother   . Heart disease Father   . Diabetes Father   . Hypertension Sister   . Diabetes Sister   . Depression Daughter   . Cancer Paternal Grandmother        ? throat CA  . Colon cancer Unknown        Unsure if mother had it   . Rheum arthritis Sister   . Bladder Cancer Neg Hx   . Kidney cancer Neg Hx      Current Outpatient Prescriptions:  .  ACCU-CHEK SMARTVIEW test strip, , Disp: , Rfl:  .  aspirin EC 81 MG tablet, Take 1 tablet (81 mg total) by mouth daily., Disp: 90 tablet, Rfl: 3 .  atorvastatin (LIPITOR) 40 MG tablet, Take 40 mg by mouth daily., Disp: , Rfl:  .  cetirizine (ZYRTEC) 10 MG tablet, Take 10 mg by mouth daily., Disp: , Rfl:  .  citalopram (CELEXA) 40 MG tablet, Take 40 mg by mouth daily. In am., Disp: , Rfl:  .  clopidogrel (PLAVIX) 75 MG tablet, Take 75 mg by mouth daily., Disp: , Rfl:  .  docusate sodium (COLACE) 100 MG capsule, Take 1 capsule (100 mg total) by mouth 2 (two) times daily., Disp: 60 capsule, Rfl: 0 .  fentaNYL (DURAGESIC - DOSED MCG/HR) 25 MCG/HR patch, Place 1 patch (25 mcg total) onto the skin every 3 (three) days., Disp: 10 patch, Rfl: 0 .  hydrochlorothiazide (HYDRODIURIL) 25 MG tablet, Take 25 mg by mouth daily., Disp: , Rfl:  .  insulin aspart (NOVOLOG) 100 UNIT/ML FlexPen, Inject into the skin., Disp: , Rfl:  .  insulin glargine (LANTUS) 100 UNIT/ML injection, Inject 24 Units into the skin at bedtime., Disp: , Rfl:  .  insulin lispro (HUMALOG) 100 UNIT/ML injection, Inject 10 Units into the skin once., Disp: , Rfl:  .  levothyroxine (SYNTHROID, LEVOTHROID) 100 MCG tablet, Take 1 tablet (100 mcg total) by mouth daily., Disp: 90 tablet, Rfl: 1 .   lidocaine-prilocaine (EMLA) cream, Apply to affected area once, Disp: 30 g, Rfl: 3 .  lisinopril (PRINIVIL,ZESTRIL) 20 MG tablet, Take 20 mg by mouth daily., Disp: , Rfl:  .  LORazepam (ATIVAN) 0.5 MG tablet, Take 1 tablet (0.5 mg total) by mouth every 6 (six) hours as needed (Nausea or vomiting)., Disp: 30 tablet,  Rfl: 0 .  metoprolol tartrate (LOPRESSOR) 25 MG tablet, TAKE 1 TABLET TWICE DAILY (Patient taking differently: 1 tablet in am.), Disp: 180 tablet, Rfl: 3 .  ondansetron (ZOFRAN) 4 MG tablet, Take 1 tablet (4 mg total) by mouth every 6 (six) hours as needed for nausea or vomiting., Disp: 30 tablet, Rfl: 0 .  ondansetron (ZOFRAN) 8 MG tablet, Take 1 tablet (8 mg total) by mouth 2 (two) times daily as needed (Nausea or vomiting)., Disp: 30 tablet, Rfl: 1 .  Oxycodone HCl 10 MG TABS, Take 1 tablet (10 mg total) by mouth every 4 (four) hours as needed., Disp: 60 tablet, Rfl: 0 .  prochlorperazine (COMPAZINE) 10 MG tablet, Take 1 tablet (10 mg total) by mouth every 6 (six) hours as needed (Nausea or vomiting)., Disp: 30 tablet, Rfl: 1 .  traZODone (DESYREL) 100 MG tablet, Take 100 mg by mouth at bedtime., Disp: , Rfl:   Physical exam:  Vitals:   11/30/16 1008  BP: 117/62  Pulse: (!) 48  Temp: 98.6 F (37 C)  TempSrc: Tympanic  Weight: 185 lb 2 oz (84 kg)   Physical Exam  Constitutional: She is oriented to person, place, and time.  Appears frail. Sitting in a wheelchair  HENT:  Head: Normocephalic and atraumatic.  Eyes: EOM are normal. Pupils are equal, round, and reactive to light.  Neck: Normal range of motion.  Cardiovascular: Normal rate, regular rhythm and normal heart sounds.   Pulmonary/Chest: Effort normal and breath sounds normal.  Abdominal: Soft. Bowel sounds are normal.  TTP in right flank. Surgical scar from left nephrectomy has healed well anteriorly  Neurological: She is alert and oriented to person, place, and time.  Skin: Skin is warm and dry.     CMP Latest  Ref Rng & Units 11/30/2016  Glucose 65 - 99 mg/dL 94  BUN 6 - 20 mg/dL 14  Creatinine 0.44 - 1.00 mg/dL 1.36(H)  Sodium 135 - 145 mmol/L 131(L)  Potassium 3.5 - 5.1 mmol/L 3.9  Chloride 101 - 111 mmol/L 97(L)  CO2 22 - 32 mmol/L 28  Calcium 8.9 - 10.3 mg/dL 8.6(L)  Total Protein 6.5 - 8.1 g/dL 6.0(L)  Total Bilirubin 0.3 - 1.2 mg/dL 0.4  Alkaline Phos 38 - 126 U/L 67  AST 15 - 41 U/L 19  ALT 14 - 54 U/L 7(L)   CBC Latest Ref Rng & Units 11/30/2016  WBC 3.6 - 11.0 K/uL 5.7  Hemoglobin 12.0 - 16.0 g/dL 10.7(L)  Hematocrit 35.0 - 47.0 % 32.1(L)  Platelets 150 - 440 K/uL 273    No images are attached to the encounter.  Ir Fluoro Guide Port Insertion Right  Result Date: 11/09/2016 INDICATION: 66 year old female with a history of high-grade urothelial carcinoma of the left kidney. EXAM: IMPLANTED PORT A CATH PLACEMENT WITH ULTRASOUND AND FLUOROSCOPIC GUIDANCE MEDICATIONS: 2 g Ancef; The antibiotic was administered within an appropriate time interval prior to skin puncture. ANESTHESIA/SEDATION: Versed 2.5 mg IV; Fentanyl 125 mcg IV; Moderate Sedation Time:  20 minutes The patient was continuously monitored during the procedure by the interventional radiology nurse under my direct supervision. FLUOROSCOPY TIME:  Zero minutes, 12 seconds (8 mGy) COMPLICATIONS: None immediate. PROCEDURE: The right neck and chest was prepped with chlorhexidine, and draped in the usual sterile fashion using maximum barrier technique (cap and mask, sterile gown, sterile gloves, large sterile sheet, hand hygiene and cutaneous antiseptic). Antibiotic prophylaxis was provided with g Ancef administered IV one hour prior to skin incision. Local  anesthesia was attained by infiltration with 1% lidocaine with epinephrine. Ultrasound demonstrated patency of the right internal jugular vein, and this was documented with an image. Under real-time ultrasound guidance, this vein was accessed with a 21 gauge micropuncture needle and image  documentation was performed. A small dermatotomy was made at the access site with an 11 scalpel. A 0.018" wire was advanced into the SVC and the access needle exchanged for a 79F micropuncture vascular sheath. The 0.018" wire was then removed and a 0.035" wire advanced into the IVC. An appropriate location for the subcutaneous reservoir was selected below the clavicle and an incision was made through the skin and underlying soft tissues. The subcutaneous tissues were then dissected using a combination of blunt and sharp surgical technique and a pocket was formed. A single lumen power injectable portacatheter was then tunneled through the subcutaneous tissues from the pocket to the dermatotomy and the port reservoir placed within the subcutaneous pocket. The venous access site was then serially dilated and a peel away vascular sheath placed over the wire. The wire was removed and the port catheter advanced into position under fluoroscopic guidance. The catheter tip is positioned in the superior cavoatrial junction. This was documented with a spot image. The portacatheter was then tested and found to flush and aspirate well. The port was flushed with saline followed by 100 units/mL heparinized saline. The pocket was then closed in two layers using first subdermal inverted interrupted absorbable sutures followed by a running subcuticular suture. The epidermis was then sealed with Dermabond. The dermatotomy at the venous access site was also closed with a single inverted subdermal suture and the epidermis sealed with Dermabond. IMPRESSION: Successful placement of a right IJ approach Power Port with ultrasound and fluoroscopic guidance. The catheter is ready for use. Signed, Criselda Peaches, MD [facility] Radiology Electronically Signed   By: Jacqulynn Cadet M.D.   On: 11/09/2016 12:58     Assessment and plan- Patient is a 66 y.o. female with locally advanced  upper urothelial carcinoma likely stage IV  Patient is cisplatin ineligible due to her poor PS, comorbidities, poorly controlled diabetes. Counts ok to proceed with cycle 2 of keytruda today. Plan to re scan after 6 cycles since there was no gross disease post op ct scan  Abdominal pain- remains diffuse and etiology unclear. She is several weeks post op and her pain today is more in her right flank. UA negative for UTI. Will obtain repeat ct abdomen without contrast since her pain is getting worse. Stop fentanyl patch and switch to oxycontin 20 mg BID. We will f/u with phone call in a week and if pain still worse short course of steroids could be tried depending on what is evident on CT scan  rtc in 3 weeks for cycle 3 of keytruda    Visit Diagnosis 1. Urothelial cancer ([place])   2. Encounter for antineoplastic immunotherapy   3. Neoplasm related pain      Dr. Randa Evens, MD, MPH [facility] Pager- [phone] 11/30/2016 5:40 PM

## 2016-11-30 NOTE — Telephone Encounter (Signed)
Pt and daughter in office and pt pulling the patches off and not using them correctly and out of pain control. Per Dr. Janese Banks she is going to order oxycontin long acting. She already has oxycodone short acting and she will cont. It.  Also she wants her to have tylenol 3 tablets of (325 mg)  Twice a day. I wrote all this info down and went over it verbally with daughter.  Dr. Janese Banks wanted me to get the left over  Fentanyl patches and detroy them because pt using them wrong.  I got the box from daughter and she was more than glad to give me the box in which it has 2 patches in it. Renita Papa looked at box and the number of patches and I called pharmacy and Myra told me to take the patches and flush them down the commode. I went with patches and Renita Papa and she witnessed the wasting of the 2 patches.

## 2016-12-01 ENCOUNTER — Encounter: Payer: Self-pay | Admitting: Oncology

## 2016-12-01 LAB — URINE CULTURE: Culture: <10000 — AB

## 2016-12-05 ENCOUNTER — Ambulatory Visit
Admission: RE | Admit: 2016-12-05 | Discharge: 2016-12-05 | Disposition: A | Discharge status: Home / Self Care | Payer: Medicare HMO | Source: Ambulatory Visit | Attending: Oncology | Admitting: Oncology

## 2016-12-05 ENCOUNTER — Telehealth: Payer: Self-pay | Admitting: *Deleted

## 2016-12-05 DIAGNOSIS — I700 Atherosclerosis of aorta: Secondary | ICD-10-CM | POA: Insufficient documentation

## 2016-12-05 DIAGNOSIS — Z5112 Encounter for antineoplastic immunotherapy: Secondary | ICD-10-CM

## 2016-12-05 DIAGNOSIS — G893 Neoplasm related pain (acute) (chronic): Secondary | ICD-10-CM | POA: Insufficient documentation

## 2016-12-05 DIAGNOSIS — R109 Unspecified abdominal pain: Secondary | ICD-10-CM | POA: Diagnosis present

## 2016-12-05 DIAGNOSIS — K5730 Diverticulosis of large intestine without perforation or abscess without bleeding: Secondary | ICD-10-CM | POA: Diagnosis not present

## 2016-12-05 DIAGNOSIS — C689 Malignant neoplasm of urinary organ, unspecified: Secondary | ICD-10-CM | POA: Diagnosis not present

## 2016-12-05 MED ORDER — MORPHINE SULFATE ER 15 MG PO TBCR: 15.0000 mg | EXTENDED_RELEASE_TABLET | Freq: Two times a day (BID) | ORAL | 0 refills | Status: DC

## 2016-12-05 MED ORDER — OXYCODONE HCL 10 MG PO TABS: 10.0000 mg | ORAL_TABLET | ORAL | 0 refills | Status: DC | PRN

## 2016-12-05 NOTE — Telephone Encounter (Signed)
Please inform patient of CT results. She has all of this abdominal pain and no clear etiology on CT. We can try morphine 15 mg ER BID. No steroids at this point. I will d/w Dr. Erlene Quan as well

## 2016-12-05 NOTE — Telephone Encounter (Signed)
Could not get Oxycodone ER due to cost of $400 out of pocket, She is running out of Oxycodone IR and is asking if we can help her get a different [place] medication. Please advise

## 2016-12-06 ENCOUNTER — Ambulatory Visit: Payer: Medicare HMO | Admitting: Cardiovascular Disease

## 2016-12-17 ENCOUNTER — Other Ambulatory Visit: Payer: Self-pay | Admitting: *Deleted

## 2016-12-17 NOTE — Telephone Encounter (Signed)
Med not due until Wednesday

## 2016-12-18 MED ORDER — OXYCODONE HCL 10 MG PO TABS: 10.0000 mg | ORAL_TABLET | ORAL | 0 refills | Status: DC | PRN

## 2016-12-18 MED ORDER — MORPHINE SULFATE ER 15 MG PO TBCR: 15.0000 mg | EXTENDED_RELEASE_TABLET | Freq: Two times a day (BID) | ORAL | 0 refills | Status: DC

## 2016-12-21 ENCOUNTER — Inpatient Hospital Stay: Payer: Medicare HMO

## 2016-12-21 ENCOUNTER — Encounter: Payer: Self-pay | Admitting: Oncology

## 2016-12-21 ENCOUNTER — Inpatient Hospital Stay (HOSPITAL_BASED_OUTPATIENT_CLINIC_OR_DEPARTMENT_OTHER): Payer: Medicare HMO | Admitting: Oncology

## 2016-12-21 VITALS — BP 92/48 | HR 42 | Temp 97.8°F | Resp 18 | Wt 185.5 lb

## 2016-12-21 DIAGNOSIS — G893 Neoplasm related pain (acute) (chronic): Secondary | ICD-10-CM

## 2016-12-21 DIAGNOSIS — R001 Bradycardia, unspecified: Secondary | ICD-10-CM

## 2016-12-21 DIAGNOSIS — C642 Malignant neoplasm of left kidney, except renal pelvis: Secondary | ICD-10-CM

## 2016-12-21 DIAGNOSIS — Z5112 Encounter for antineoplastic immunotherapy: Secondary | ICD-10-CM

## 2016-12-21 DIAGNOSIS — Z905 Acquired absence of kidney: Secondary | ICD-10-CM

## 2016-12-21 DIAGNOSIS — C689 Malignant neoplasm of urinary organ, unspecified: Secondary | ICD-10-CM

## 2016-12-21 DIAGNOSIS — R109 Unspecified abdominal pain: Secondary | ICD-10-CM | POA: Diagnosis not present

## 2016-12-21 DIAGNOSIS — Z79899 Other long term (current) drug therapy: Secondary | ICD-10-CM | POA: Diagnosis not present

## 2016-12-21 DIAGNOSIS — D509 Iron deficiency anemia, unspecified: Secondary | ICD-10-CM

## 2016-12-21 DIAGNOSIS — Z5111 Encounter for antineoplastic chemotherapy: Secondary | ICD-10-CM | POA: Diagnosis not present

## 2016-12-21 LAB — CBC WITH DIFFERENTIAL/PLATELET
BASOS ABS: 0.1 10*3/uL (ref 0–0.1)
Basophils Relative: 1 %
EOS ABS: 0.5 10*3/uL (ref 0–0.7)
Eosinophils Relative: 9 %
HCT: 30.7 % — ABNORMAL LOW (ref 35.0–47.0)
HEMOGLOBIN: 10.2 g/dL — AB (ref 12.0–16.0)
LYMPHS ABS: 1 10*3/uL (ref 1.0–3.6)
Lymphocytes Relative: 19 %
MCH: 26.4 pg (ref 26.0–34.0)
MCHC: 33.2 g/dL (ref 32.0–36.0)
MCV: 79.5 fL — ABNORMAL LOW (ref 80.0–100.0)
Monocytes Absolute: 0.2 10*3/uL (ref 0.2–0.9)
Monocytes Relative: 4 %
NEUTROS PCT: 67 %
Neutro Abs: 3.6 10*3/uL (ref 1.4–6.5)
Platelets: 263 10*3/uL (ref 150–440)
RBC: 3.85 MIL/uL (ref 3.80–5.20)
RDW: 18.1 % — ABNORMAL HIGH (ref 11.5–14.5)
WBC: 5.4 10*3/uL (ref 3.6–11.0)

## 2016-12-21 LAB — COMPREHENSIVE METABOLIC PANEL
ALT: 7 U/L — AB (ref 14–54)
AST: 15 U/L (ref 15–41)
Albumin: 2.8 g/dL — ABNORMAL LOW (ref 3.5–5.0)
Alkaline Phosphatase: 63 U/L (ref 38–126)
Anion gap: 5 (ref 5–15)
BUN: 12 mg/dL (ref 6–20)
CHLORIDE: 101 mmol/L (ref 101–111)
CO2: 29 mmol/L (ref 22–32)
CREATININE: 1.23 mg/dL — AB (ref 0.44–1.00)
Calcium: 8.1 mg/dL — ABNORMAL LOW (ref 8.9–10.3)
GFR calc Af Amer: 51 mL/min — ABNORMAL LOW (ref >60)
GFR, EST NON AFRICAN AMERICAN: 44 mL/min — AB (ref >60)
Glucose, Bld: 139 mg/dL — ABNORMAL HIGH (ref 65–99)
POTASSIUM: 3.7 mmol/L (ref 3.5–5.1)
Sodium: 135 mmol/L (ref 135–145)
Total Bilirubin: 0.2 mg/dL — ABNORMAL LOW (ref 0.3–1.2)
Total Protein: 5.7 g/dL — ABNORMAL LOW (ref 6.5–8.1)

## 2016-12-21 MED ORDER — SODIUM CHLORIDE 0.9 % IV SOLN
Freq: Once | INTRAVENOUS | Status: AC
  Administered 2016-12-21: 10:00:00 via INTRAVENOUS
  Filled 2016-12-21: qty 1000

## 2016-12-21 MED ORDER — HEPARIN SOD (PORK) LOCK FLUSH 100 UNIT/ML IV SOLN
500.0000 [IU] | Freq: Once | INTRAVENOUS | Status: AC
  Administered 2016-12-21: 500 [IU] via INTRAVENOUS

## 2016-12-21 MED ORDER — SODIUM CHLORIDE 0.9% FLUSH
10.0000 mL | INTRAVENOUS | Status: DC | PRN
  Administered 2016-12-21: 10 mL via INTRAVENOUS
  Filled 2016-12-21: qty 10

## 2016-12-21 MED ORDER — SODIUM CHLORIDE 0.9 % IV SOLN
200.0000 mg | Freq: Once | INTRAVENOUS | Status: AC
  Administered 2016-12-21: 200 mg via INTRAVENOUS
  Filled 2016-12-21: qty 8

## 2016-12-21 NOTE — Progress Notes (Signed)
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 Erlene Quan in March 2018. In late February 2018 patient presented with acute onset left-sided flank pain and was found to have a large 6 x 6.6 x 6 1 cm mid to left upper pole renal mass on which was directly fading the left adrenal gland measuring 3.1 x 3.9 cm. There was no obvious renal vein invasion. No intra-abdominal adenopathy was noted and chest x-ray did not reveal any obvious pulmonary disease  2. Patient underwent left radical nephrectomy on 10/02/2016. During the time of surgery the mass appeared incredibly fibrotic, desmoplastic reaction was noted in the medial aspect of the kidney in the hilar area involving the upper part of the hilum and the adrenal gland. There was also concern for renal vein invasion. Distal ureterectomy and lymph node dissection was not performed thinking that this was possibly a renal cell carcinoma.  3. DIAGNOSIS:  A. LEFT KIDNEY; ROBOTIC-ASSISTED LAPAROSCOPIC RADICAL NEPHRECTOMY:  - UROTHELIAL CARCINOMA, HIGH-GRADE.  - LYMPHOVASCULAR AND PERINEURAL INVASION PRESENT.  - THE HILAR MARGIN IS POSITIVE.  - THE URETERAL AND GEROTA'S FASCIA MARGINS ARE NEGATIVE.  - SEE SUMMARY BELOW.   Surgical Pathology Cancer Case Summary   RENAL PELVIS AND URETER:  Procedure: Robotic-assisted laparoscopic radical nephrectomy  Specimen Laterality: Left  Tumor Site: Hilum, upper and lower poles  Histologic Type: Urothelial carcinoma  Histologic  Grade: High grade  Tumor Extension: Tumor invades adjacent organs (adrenal gland) and  through the kidney into the perinephric fat  Margins: Hilar margin positive; ureteral and Gerota's fasciamargins  negative  Lymphovascular Invasion: Present  Regional Lymph Nodes: No lymph nodes submitted or found  Pathologic Stage Classification (pTNM, AJCC 8th Edition): pT4 pNX  TNM Descriptors: Not applicable  4. Discussed patient's case with Dr. Erlene Quan urology. She does not think that repeat surgery for distal ureterecomy and LN dissection can be performed given patients performance status and comorbidities. Patient lives with her sister and has limited mobility around the house. She is still complaining of abdominal pain which is persistent and of unclear etiology  5. Case discussed at tumor board and pathology slides and images reviewed. Repeat ct chest/abdomen does not reveal gross disease post surgery. However, operative findings suggest locally advanced carcinoma with positive margins suggestive of residual disease. LN not assessed although likely involved making this stage IV. Cystoscopy in may 2018 was NED  Interval history- abdominal/ flank pain well controlled with pain meds. Reports fatigue. Denies other complaints  ECOG PS- 2 Pain scale- 0   Review of systems- Review of Systems  Constitutional: Positive for malaise/fatigue.  Gastrointestinal: Positive for abdominal pain.      Allergies  Allergen Reactions  . Norco [Hydrocodone-Acetaminophen] Other (See Comments)    hallucinations hallucinations  . Codeine Phosphate Other (See Comments)    REACTION: unspecified  . Duloxetine Other (See Comments)    REACTION: unspecified  . Meperidine Hcl Other (See Comments)    REACTION: unspecified  . Venlafaxine Other (See Comments)    REACTION: unspecified  . Doxycycline Hives and  Rash     Past Medical History:  Diagnosis Date  . Adenomatous polyp 1999  . Anxiety   . Arthritis     Left knee  . CAD (coronary artery disease) of artery bypass graft    a. NSTEMI in 07/2015: Cath showed 99% stenosis of distal LCx  --> DES placed  . Depression    Paranoid tendencies  . Diabetes mellitus    Type II  . Diverticulosis   . GERD (gastroesophageal reflux disease)   . Hyperlipidemia   . Hypertension   . Hypothyroidism   . Myocardial infarction [facility]) 2017   2016 per pt  . Obesity      Past Surgical History:  Procedure Laterality Date  . ABDOMINAL HYSTERECTOMY  1977   One ovary left  . APPENDECTOMY    . BLADDER REPAIR  1977   Tacking  . CARDIAC CATHETERIZATION N/A 08/01/2015   Procedure: Left Heart Cath and Coronary Angiography;  Surgeon: Wellington Hampshire, MD;  Location: [place] [facility];  Service: Cardiovascular;  Laterality: N/A;  . CARDIAC CATHETERIZATION N/A 08/01/2015   Procedure: Coronary Stent Intervention;  Surgeon: Wellington Hampshire, MD;  Location: [place] [facility];  Service: Cardiovascular;  Laterality: N/A;  . CARDIOVASCULAR STRESS TEST  7/00   negative. No cardiolite  . CAROTID ENDARTERECTOMY  2/11   Dr Jamal Collin  . [place]  . CHOLECYSTECTOMY    . CORONARY ARTERY BYPASS GRAFT     2017  . CT ABD W & PELVIS WO CM  2002   Negative. Pituitary normal on MRI  . HEEL SPUR SURGERY  03/03   Left  . INTRAMEDULLARY (IM) NAIL INTERTROCHANTERIC Left 08/04/2015   Procedure: INTRAMEDULLARY (IM) NAIL INTERTROCHANTRIC;  Surgeon: Hessie Knows, MD;  Location: [facility];  Service: Orthopedics;  Laterality: Left;  . IR FLUORO GUIDE PORT INSERTION RIGHT  11/09/2016  . KNEE ARTHROSCOPY  2001   Left   Tamala Julian)  . RENAL MASS EXCISION Left april10/18   per pt left kidney removed -cancerous per pt /family  . ROBOT ASSISTED LAPAROSCOPIC NEPHRECTOMY Left 10/02/2016   Procedure: ROBOTIC ASSISTED LAPAROSCOPIC RADICAL NEPHRECTOMY;  Surgeon: Hollice Espy, MD;  Location: [facility];  Service: Urology;  Laterality: Left;  . TEE WITHOUT CARDIOVERSION N/A 04/11/2016     Procedure: TRANSESOPHAGEAL ECHOCARDIOGRAM (TEE);  Surgeon: Teodoro Spray, MD;  Location: [facility];  Service: Cardiovascular;  Laterality: N/A;  . TOTAL KNEE ARTHROPLASTY  11/12   left--Dr Tamala Julian  . TRANSTHORACIC ECHOCARDIOGRAM  3/04   normal    Social History   Social History  . Marital status: Widowed    Spouse name: N/A  . Number of children: 3  . Years of education: N/A   Occupational History  . Receptionist---retired     [employer]  .     [employer]      Social History Main Topics  . Smoking status: Former Smoker    Packs/day: 2.00    Years: 15.00    Types: Cigarettes    Quit date: 09/06/1986  . Smokeless tobacco: Never Used  . Alcohol use No  . Drug use: No  . Sexual activity: No   Other Topics Concern  . Not on file   Social History Narrative   Widowed 07/02.  Kids have moved out and now living with friend to share expenses.      3 caffeine drinks daily     Family History  Problem Relation Age of Onset  .  Cancer Mother        Breast  . Heart disease Mother   . Diabetes Mother   . Colon polyps Mother   . Heart disease Father   . Diabetes Father   . Hypertension Sister   . Diabetes Sister   . Depression Daughter   . Cancer Paternal Grandmother        ? throat CA  . Colon cancer Unknown        Unsure if mother had it   . Rheum arthritis Sister   . Bladder Cancer Neg Hx   . Kidney cancer Neg Hx      Current Outpatient Prescriptions:  .  ACCU-CHEK SMARTVIEW test strip, , Disp: , Rfl:  .  aspirin EC 81 MG tablet, Take 1 tablet (81 mg total) by mouth daily., Disp: 90 tablet, Rfl: 3 .  atorvastatin (LIPITOR) 40 MG tablet, Take 40 mg by mouth daily., Disp: , Rfl:  .  citalopram (CELEXA) 40 MG tablet, Take 40 mg by mouth daily. In am., Disp: , Rfl:  .  docusate sodium (COLACE) 100 MG capsule, Take 1 capsule (100 mg total) by mouth 2 (two) times daily., Disp: 60 capsule, Rfl: 0 .  hydrochlorothiazide (HYDRODIURIL) 25 MG tablet, Take 25 mg by mouth  daily., Disp: , Rfl:  .  insulin aspart (NOVOLOG) 100 UNIT/ML FlexPen, Inject into the skin., Disp: , Rfl:  .  insulin glargine (LANTUS) 100 UNIT/ML injection, Inject 24 Units into the skin at bedtime., Disp: , Rfl:  .  levothyroxine (SYNTHROID, LEVOTHROID) 100 MCG tablet, Take 1 tablet (100 mcg total) by mouth daily., Disp: 90 tablet, Rfl: 1 .  lidocaine-prilocaine (EMLA) cream, Apply to affected area once, Disp: 30 g, Rfl: 3 .  lisinopril (PRINIVIL,ZESTRIL) 20 MG tablet, Take 20 mg by mouth daily., Disp: , Rfl:  .  LORazepam (ATIVAN) 0.5 MG tablet, Take 1 tablet (0.5 mg total) by mouth every 6 (six) hours as needed (Nausea or vomiting)., Disp: 30 tablet, Rfl: 0 .  metoprolol tartrate (LOPRESSOR) 25 MG tablet, TAKE 1 TABLET TWICE DAILY (Patient taking differently: 1 tablet in am.), Disp: 180 tablet, Rfl: 3 .  morphine (MS CONTIN) 15 MG 12 hr tablet, Take 1 tablet (15 mg total) by mouth every 12 (twelve) hours., Disp: 30 tablet, Rfl: 0 .  ondansetron (ZOFRAN) 4 MG tablet, Take 1 tablet (4 mg total) by mouth every 6 (six) hours as needed for nausea or vomiting., Disp: 30 tablet, Rfl: 0 .  ondansetron (ZOFRAN) 8 MG tablet, Take 1 tablet (8 mg total) by mouth 2 (two) times daily as needed (Nausea or vomiting)., Disp: 30 tablet, Rfl: 1 .  Oxycodone HCl 10 MG TABS, Take 1 tablet (10 mg total) by mouth every 4 (four) hours as needed., Disp: 60 tablet, Rfl: 0 .  prochlorperazine (COMPAZINE) 10 MG tablet, Take 1 tablet (10 mg total) by mouth every 6 (six) hours as needed (Nausea or vomiting)., Disp: 30 tablet, Rfl: 1 .  traZODone (DESYREL) 100 MG tablet, Take 100 mg by mouth at bedtime., Disp: , Rfl:  No current facility-administered medications for this visit.   Facility-Administered Medications Ordered in Other Visits:  .  sodium chloride flush (NS) 0.9 % injection 10 mL, 10 mL, Intravenous, PRN, Sindy Guadeloupe, MD, 10 mL at 12/21/16 0902  Physical exam:  Vitals:   12/21/16 0928  BP: (!) 92/48    Pulse: (!) 42  Resp: 18  Temp: 97.8 F (36.6 C)  TempSrc: Tympanic  Weight: 185 lb 8 oz (84.1 kg)   Physical Exam  Constitutional: She is oriented to person, place, and time.  She is sitting in a wheelchair and appears in no acute distress  HENT:  Head: Normocephalic and atraumatic.  Eyes: EOM are normal. Pupils are equal, round, and reactive to light.  Neck: Normal range of motion.  Cardiovascular: Normal rate, regular rhythm and normal heart sounds.   Pulmonary/Chest: Effort normal and breath sounds normal.  Abdominal: Soft. Bowel sounds are normal.  Neurological: She is alert and oriented to person, place, and time.  Skin: Skin is warm and dry.     CMP Latest Ref Rng & Units 12/21/2016  Glucose 65 - 99 mg/dL 139(H)  BUN 6 - 20 mg/dL 12  Creatinine 0.44 - 1.00 mg/dL 1.23(H)  Sodium 135 - 145 mmol/L 135  Potassium 3.5 - 5.1 mmol/L 3.7  Chloride 101 - 111 mmol/L 101  CO2 22 - 32 mmol/L 29  Calcium 8.9 - 10.3 mg/dL 8.1(L)  Total Protein 6.5 - 8.1 g/dL 5.7(L)  Total Bilirubin 0.3 - 1.2 mg/dL 0.2(L)  Alkaline Phos 38 - 126 U/L 63  AST 15 - 41 U/L 15  ALT 14 - 54 U/L 7(L)   CBC Latest Ref Rng & Units 12/21/2016  WBC 3.6 - 11.0 K/uL 5.4  Hemoglobin 12.0 - 16.0 g/dL 10.2(L)  Hematocrit 35.0 - 47.0 % 30.7(L)  Platelets 150 - 440 K/uL 263    No images are attached to the encounter.  Ct Abdomen Pelvis Wo Contrast  Result Date: 12/05/2016 CLINICAL DATA:  Right lower abdominal pain for 6 weeks. Status post left kidney removal in April of 2018 for urothelial carcinoma. EXAM: CT ABDOMEN AND PELVIS WITHOUT CONTRAST TECHNIQUE: Multidetector CT imaging of the abdomen and pelvis was performed following the standard protocol without IV contrast. COMPARISON:  CT abdomen dated 10/23/2016. FINDINGS: Lower chest: No acute abnormality. Hepatobiliary: No focal liver abnormality is seen. Status post cholecystectomy. No biliary dilatation. Pancreas: Unremarkable. No pancreatic ductal dilatation  or surrounding inflammatory changes. Spleen: Normal in size without focal abnormality. Adrenals/Urinary Tract: Left adrenal gland and left kidney are absent. Expected postsurgical changes within the left renal fossa. Right kidney appears normal without mass, stone or hydronephrosis. Right adrenal gland appears normal. No ureteral or bladder calculi identified. Bladder is decompressed. Stomach/Bowel: Bowel is normal in caliber. Extensive diverticulosis of the sigmoid and descending colon, and scattered diverticulosis within the transverse and ascending colon, but no focal inflammatory change to suggest acute diverticulitis at this time. Stomach appears normal. Appendix is not seen but there are no inflammatory changes about the cecum to suggest acute appendicitis. Vascular/Lymphatic: Aortic atherosclerosis. Soft tissue density material adjacent to the upper abdominal aorta, at the levels of the celiac trunk and SMA takeoffs possibly postsurgical, new compared to the earlier CT of 08/19/2016. No enlarged lymph nodes seen in the abdomen or pelvis. Reproductive: Presumed hysterectomy.  No adnexal mass. Other: No free fluid or abscess collection. No free intraperitoneal air. Musculoskeletal: No acute or suspicious osseous finding. Degenerative change of the lumbar spine, mild to moderate in degree. Scattered edema like change within the subcutaneous soft tissues of the lower abdomen (cellulitis? ). IMPRESSION: 1. No acute intra-abdominal or intrapelvic findings. No source for right lower abdominal pain identified. No bowel obstruction or evidence of bowel wall inflammation. No evidence of acute solid organ abnormality. No right-sided renal or ureteral calculi. 2. Ill-defined edema within the superficial soft tissues of the lower abdomen (cellulitis? ). 3.  Soft tissue density material abutting the crus of the diaphragm and left anterior-lateral margin of the upper abdominal aorta, probably postsurgical  scarring/thickening, new compared to the pre-surgical CT abdomen of 08/19/2016. Would consider PET-CT to exclude the less likely possibility of this representing neoplastic implant. 4. Colonic diverticulosis without evidence of acute diverticulitis. 5. Aortic atherosclerosis. Electronically Signed   By: Franki Cabot M.D.   On: 12/05/2016 14:03     Assessment and plan- Patient is a 66 y.o. female with locally advanced upper urothelial carcinoma likely stage IV  1. Counts ok to proceed with cycle 3 of keytruda today. tsh from may 2018 wnl  2. Abdominal/flank pain- etiology remains unclear. CT abdomen did not show any inflammatory/ malignant process. Will continue morphine sr and prn oxycodone which she is to take as prescribed and not any more frequently.  3. Hypotension/bradycardia- decrease dose of metoprolol to 12.5 bid and lisinorpil to 10 mg daily. She will discuss this further with her cardiologist next week  4. Microcytic anemia- check iron studies with next cycle  rtc in 3 weeks for cycle 4 of keytruda with labs   Visit Diagnosis 1. Urothelial cancer ([place])   2. Encounter for antineoplastic immunotherapy      Dr. Randa Evens, MD, MPH [facility] Pager- [phone] 12/21/2016 1:22 PM

## 2016-12-21 NOTE — Progress Notes (Signed)
Patient states she is having problems swallowing solid foods.  Feels like it does not go down.  Sometimes has same feeling when swallowing pills.  Patient accompanied by her daughter today.  Daughter is requesting refill for Lorazepam.  Daughter also states patient is having bilateral lower extremity edema.  Patient also having memory problems.Anita Small

## 2016-12-26 DIAGNOSIS — F3342 Major depressive disorder, recurrent, in full remission: Secondary | ICD-10-CM | POA: Insufficient documentation

## 2017-01-01 ENCOUNTER — Other Ambulatory Visit: Payer: Self-pay | Admitting: *Deleted

## 2017-01-01 DIAGNOSIS — G893 Neoplasm related pain (acute) (chronic): Secondary | ICD-10-CM

## 2017-01-01 DIAGNOSIS — Z5112 Encounter for antineoplastic immunotherapy: Secondary | ICD-10-CM

## 2017-01-01 DIAGNOSIS — C689 Malignant neoplasm of urinary organ, unspecified: Secondary | ICD-10-CM

## 2017-01-01 MED ORDER — PROCHLORPERAZINE MALEATE 10 MG PO TABS: 10.0000 mg | ORAL_TABLET | Freq: Four times a day (QID) | ORAL | 1 refills | Status: DC | PRN

## 2017-01-01 MED ORDER — LORAZEPAM 0.5 MG PO TABS: 0.5000 mg | ORAL_TABLET | Freq: Four times a day (QID) | ORAL | 0 refills | Status: DC | PRN

## 2017-01-04 ENCOUNTER — Other Ambulatory Visit: Payer: Self-pay | Admitting: *Deleted

## 2017-01-04 MED ORDER — MORPHINE SULFATE ER 15 MG PO TBCR: 15.0000 mg | EXTENDED_RELEASE_TABLET | Freq: Two times a day (BID) | ORAL | 0 refills | Status: DC

## 2017-01-04 MED ORDER — OXYCODONE HCL 10 MG PO TABS: 10.0000 mg | ORAL_TABLET | ORAL | 0 refills | Status: DC | PRN

## 2017-01-04 NOTE — Telephone Encounter (Signed)
Anita Small will pick up in [facility] office

## 2017-01-09 ENCOUNTER — Encounter: Payer: Self-pay | Admitting: Nurse Practitioner

## 2017-01-09 ENCOUNTER — Ambulatory Visit (INDEPENDENT_AMBULATORY_CARE_PROVIDER_SITE_OTHER): Payer: Medicare HMO | Admitting: Nurse Practitioner

## 2017-01-09 VITALS — BP 100/54 | HR 46 | Ht 62.0 in | Wt 179.5 lb

## 2017-01-09 DIAGNOSIS — I2510 Atherosclerotic heart disease of native coronary artery without angina pectoris: Secondary | ICD-10-CM | POA: Diagnosis not present

## 2017-01-09 DIAGNOSIS — R9431 Abnormal electrocardiogram [ECG] [EKG]: Secondary | ICD-10-CM

## 2017-01-09 DIAGNOSIS — E119 Type 2 diabetes mellitus without complications: Secondary | ICD-10-CM

## 2017-01-09 DIAGNOSIS — Z794 Long term (current) use of insulin: Secondary | ICD-10-CM | POA: Diagnosis not present

## 2017-01-09 DIAGNOSIS — E785 Hyperlipidemia, unspecified: Secondary | ICD-10-CM | POA: Diagnosis not present

## 2017-01-09 DIAGNOSIS — R0989 Other specified symptoms and signs involving the circulatory and respiratory systems: Secondary | ICD-10-CM | POA: Diagnosis not present

## 2017-01-09 DIAGNOSIS — I10 Essential (primary) hypertension: Secondary | ICD-10-CM

## 2017-01-09 MED ORDER — METOPROLOL TARTRATE 25 MG PO TABS: 12.5000 mg | ORAL_TABLET | Freq: Every day | ORAL | 3 refills | Status: DC

## 2017-01-09 NOTE — Patient Instructions (Signed)
Medication Instructions:  Your physician has recommended you make the following change in your medication:  DECREASE metoprolol to 12.5mg  once daily   Labwork: None  Testing/Procedures: Your physician has requested that you have a carotid duplex. This test is an ultrasound of the carotid arteries in your neck. It looks at blood flow through these arteries that supply the brain with blood. Allow one hour for this exam. There are no restrictions or special instructions.    Follow-Up: Your physician recommends that you schedule a follow-up appointment in: 3 months with Dr. Fletcher Anon   Any Other Special Instructions Will Be Listed Below (If Applicable).     If you need a refill on your cardiac medications before your next appointment, please call your pharmacy.

## 2017-01-09 NOTE — Progress Notes (Signed)
Office Visit    Patient Name: Anita Small Date of Encounter: 01/09/2017  Primary Care Provider:  Elaina Pattee, MD Primary Cardiologist:  Jerilynn Mages. Fletcher Anon, MD   Chief Complaint    66 year old female with a history of CAD status post non-STEMI in April 2017 with circumflex stenting, hypertension, hyperlipidemia, hypothyroidism, diabetes, Carotid arterial disease status post right carotid endarterectomy, urothelial carcinoma status post left nephrectomy, and obesity, who presents for follow-up.  Past Medical History    Past Medical History:  Diagnosis Date  . Adenomatous polyp 1999  . Anxiety   . Arthritis    Left knee  . Bacteremia    a. 03/2016 admitted with MRSA/GAS bacteremia following fall and dog bites;  b. 03/2017 TEE: EF 55-60%, triv AI, mild MR, no veg.  Marland Kitchen CAD (coronary artery disease) of artery bypass graft    a. NSTEMI in 07/2015: Cath showed 99% stenosis of distal LCx  --> DES placed;  b. 08/2016 Lexiscan MV: no ischemia, EF 36% (likely 2/2 GI uptake/artifact).  . Carotid arterial disease ([place])    a. 2011 s/p R CEA.  . Depression    Paranoid tendencies  . Diverticulosis   . GERD (gastroesophageal reflux disease)   . Hyperlipidemia   . Hypertension   . Hypothyroidism   . Myocardial infarction ([place]) 2017  . Obesity   . Type II diabetes mellitus ([place])   . Urothelial carcinoma of kidney, left ([place])    a. 08/2016 s/p L nephrectomy-->now on immunoRx.   Past Surgical History:  Procedure Laterality Date  . ABDOMINAL HYSTERECTOMY  1977   One ovary left  . APPENDECTOMY    . BLADDER REPAIR  1977   Tacking  . CARDIAC CATHETERIZATION N/A 08/01/2015   Procedure: Left Heart Cath and Coronary Angiography;  Surgeon: Wellington Hampshire, MD;  Location: [facility];  Service: Cardiovascular;  Laterality: N/A;  . CARDIAC CATHETERIZATION N/A 08/01/2015   Procedure: Coronary Stent Intervention;  Surgeon: Wellington Hampshire, MD;  Location: [facility];   Service: Cardiovascular;  Laterality: N/A;  . CARDIOVASCULAR STRESS TEST  7/00   negative. No cardiolite  . CAROTID ENDARTERECTOMY  2/11   Dr Jamal Collin  . [place]  . CHOLECYSTECTOMY    . CT ABD W & PELVIS WO CM  2002   Negative. Pituitary normal on MRI  . HEEL SPUR SURGERY  03/03   Left  . INTRAMEDULLARY (IM) NAIL INTERTROCHANTERIC Left 08/04/2015   Procedure: INTRAMEDULLARY (IM) NAIL INTERTROCHANTRIC;  Surgeon: Hessie Knows, MD;  Location: [facility];  Service: Orthopedics;  Laterality: Left;  . IR FLUORO GUIDE PORT INSERTION RIGHT  11/09/2016  . KNEE ARTHROSCOPY  2001   Left   Tamala Julian)  . RENAL MASS EXCISION Left april10/18   per pt left kidney removed -cancerous per pt /family  . ROBOT ASSISTED LAPAROSCOPIC NEPHRECTOMY Left 10/02/2016   Procedure: ROBOTIC ASSISTED LAPAROSCOPIC RADICAL NEPHRECTOMY;  Surgeon: Hollice Espy, MD;  Location: [facility];  Service: Urology;  Laterality: Left;  . TEE WITHOUT CARDIOVERSION N/A 04/11/2016   Procedure: TRANSESOPHAGEAL ECHOCARDIOGRAM (TEE);  Surgeon: Teodoro Spray, MD;  Location: [facility];  Service: Cardiovascular;  Laterality: N/A;  . TOTAL KNEE ARTHROPLASTY  11/12   left--Dr Tamala Julian  . TRANSTHORACIC ECHOCARDIOGRAM  3/04   normal    Allergies  Allergies  Allergen Reactions  . Norco [Hydrocodone-Acetaminophen] Other (See Comments)    hallucinations hallucinations  . Codeine Phosphate Other (See Comments)  REACTION: unspecified  . Duloxetine Other (See Comments)    REACTION: unspecified  . Meperidine Hcl Other (See Comments)    REACTION: unspecified  . Venlafaxine Other (See Comments)    REACTION: unspecified  . Doxycycline Hives and Rash    History of Present Illness    66 year old female with the above complex past medical history including CAD, hypertension, hyperlipidemia, diabetes, hypothyroidism, Carotid arterial disease status post right carotid endarterectomy, urothelial carcinoma of the left kidney status  post left nephrectomy, arthritis, and obesity. Cardiac history dates back to February 2017, at which time she was admitted following fall and left hip fracture and was noted to have an elevated troponin. Echocardiogram showed normal LV function. She subsequently underwent diagnostic catheterization revealing severe left circumflex disease and this was successfully treated with a drug-eluting stent. She subsequently underwent intramedullary nailing of the left hip. In October 2017, she was admitted following fall and also with bacteremia. TEE did not show vegetation. She was treated with antibiotics and recovered. Earlier this year, she was diagnosed with a left renal mass. She was seen by urology with recommendation for radical nephrectomy. In March, she was evaluated for cardiology clearance and underwent stress testing, which was low risk. She underwent left nephrectomy in March and postoperative course was uneventful. She has been followed by oncology and is now on immunotherapy. She is tolerating this well.  From a cardiac standpoint, she has done well without chest pain, dyspnea, PND, orthopnea, palpitations, dizziness, syncope, edema, or early satiety. She is very sedentary and walks only limited distances in her home. She is unsteady on her feet. She says this is secondary to a history of arthritis and prior left knee replacement without adequate physical therapy. She also now has pain since her surgery and cancer diagnosis and is using oxycodone. She has not had any recent falls. She would be interested in physical therapy.  Home Medications    Prior to Admission medications   Medication Sig Start Date End Date Taking? Authorizing Provider  ACCU-CHEK SMARTVIEW test strip  10/23/16   [provider]  aspirin EC 81 MG tablet Take 1 tablet (81 mg total) by mouth daily. 09/05/16   Dunn, Areta Haber, PA-C  atorvastatin (LIPITOR) 40 MG tablet Take 40 mg by mouth daily.    [provider]    citalopram (CELEXA) 40 MG tablet Take 40 mg by mouth daily. In am.    [provider]  docusate sodium (COLACE) 100 MG capsule Take 1 capsule (100 mg total) by mouth 2 (two) times daily. 10/05/16   Hollice Espy, MD  hydrochlorothiazide (HYDRODIURIL) 25 MG tablet Take 25 mg by mouth daily.    [provider]  insulin aspart (NOVOLOG) 100 UNIT/ML FlexPen Inject into the skin. 10/23/16 10/23/17  [provider]  insulin glargine (LANTUS) 100 UNIT/ML injection Inject 24 Units into the skin at bedtime.    [provider]  levothyroxine (SYNTHROID, LEVOTHROID) 100 MCG tablet Take 1 tablet (100 mcg total) by mouth daily. 02/25/14   Venia Carbon, MD  lidocaine-prilocaine (EMLA) cream Apply to affected area once 11/02/16   Sindy Guadeloupe, MD  lisinopril (PRINIVIL,ZESTRIL) 20 MG tablet Take 20 mg by mouth daily.    [provider]  LORazepam (ATIVAN) 0.5 MG tablet Take 1 tablet (0.5 mg total) by mouth every 6 (six) hours as needed (Nausea or vomiting). 01/01/17   Sindy Guadeloupe, MD  metoprolol tartrate (LOPRESSOR) 25 MG tablet TAKE 1 TABLET TWICE DAILY  Patient taking differently: 1 tablet in am. 12/05/15   Wellington Hampshire, MD  morphine (MS CONTIN) 15 MG 12 hr tablet Take 1 tablet (15 mg total) by mouth every 12 (twelve) hours. 01/04/17   Lloyd Huger, MD  ondansetron (ZOFRAN) 4 MG tablet Take 1 tablet (4 mg total) by mouth every 6 (six) hours as needed for nausea or vomiting. 10/30/16   Sindy Guadeloupe, MD  ondansetron (ZOFRAN) 8 MG tablet Take 1 tablet (8 mg total) by mouth 2 (two) times daily as needed (Nausea or vomiting). 11/02/16   Sindy Guadeloupe, MD  Oxycodone HCl 10 MG TABS Take 1 tablet (10 mg total) by mouth every 4 (four) hours as needed. 01/04/17   Lloyd Huger, MD  prochlorperazine (COMPAZINE) 10 MG tablet Take 1 tablet (10 mg total) by mouth every 6 (six) hours as needed (Nausea or vomiting). 01/01/17   Sindy Guadeloupe, MD  traZODone (DESYREL)  100 MG tablet Take 100 mg by mouth at bedtime.    [provider]    Review of Systems    She has done well from a cardiac standpoint without chest pain, palpitations, dyspnea, PND, orthopnea, dizziness, syncope, edema, or early satiety. She does have chronic arthritic pain. She is also unsteady on her feet though has not fallen recently.  All other systems reviewed and are otherwise negative except as noted above.  Physical Exam    VS:  BP (!) 100/54 (BP Location: Left Arm, Patient Position: Sitting, Cuff Size: Normal)   Pulse (!) 46   Ht 5\' 2"  (1.575 m)   Wt 179 lb 8 oz (81.4 kg)   BMI 32.83 kg/m  , BMI Body mass index is 32.83 kg/m. GEN: Well nourished, well developed, in no acute distress.  HEENT: normal.  Neck: Supple, no JVD, Bilateral carotid bruits, no masses. Cardiac: BMW,4/1 systolic ejection murmur at the right upper sternal border, no rubs, or gallops. No clubbing, cyanosis, edema.  Radials/DP/PT 2+ and equal bilaterally.  Respiratory:  Respirations regular and unlabored, clear to auscultation bilaterally. GI: Soft, nontender, nondistended, BS + x 4. MS: no deformity or atrophy. Skin: warm and dry, no rash. Neuro:  Strength and sensation are intact. Psych: Normal affect.  Accessory Clinical Findings    ECG - Sinus bradycardia, 46, prolonged QT  Assessment & Plan    1.  Coronary artery disease: Status post prior left circumflex stenting. She is done well without any chest pain and recently had a low risk stress test prior to her left nephrectomy. She is not particularly active but does not experience dyspnea. She remains on aspirin, statin, beta blocker, and ACE inhibitor therapy. In the setting of soft blood pressure and bradycardia with a heart rate of 46, I am reducing her metoprolol to 12.5 mg daily.  2. Essential hypertension: Blood pressure is soft today. Reducing metoprolol to 12.5 mg daily. Patient's daughter plans to buy a blood pressure cuff and will  monitor her blood pressure and heart rate at home. They will contact us if blood pressure remains less than 110 and/or heart rates remained below 50. If so, I would have a low threshold to discontinue metoprolol at that point.  3. Hyperlipidemia: She remains on statin therapy. This is followed by primary care.   4. Type 2 diabetes mellitus: Followed by primary care. A1c was over 14 in March.  5. Bilateral carotid arterial disease: Status post right carotid endarterectomy. I have reviewed care everywhere and also [facility].  I do not see any recent carotid ultrasound. Patient has memory deficit and is not aware of any recent ultrasounds. We will schedule for carotid ultrasound. Continue aspirin and statin therapy.  6. QT prolongation: A corrected QT is 484 ms. She is on Celexa which may be contributing and this dose was recently reduced. May need to consider alternate antidepressant agent in the future. Defer to primary care.  7. Left-sided urothelial carcinoma: Status post left nephrectomy. Immunotherapy per oncology.  8. Disposition: Patient will follow up in 3 months or sooner if necessary.  Murray Hodgkins, NP 01/09/2017, 2:44 PM

## 2017-01-11 ENCOUNTER — Inpatient Hospital Stay: Payer: Medicare HMO | Attending: Oncology | Admitting: Oncology

## 2017-01-11 ENCOUNTER — Inpatient Hospital Stay: Payer: Medicare HMO

## 2017-01-11 ENCOUNTER — Encounter: Payer: Self-pay | Admitting: Oncology

## 2017-01-11 VITALS — BP 139/53 | HR 40 | Temp 97.7°F | Resp 20 | Wt 179.0 lb

## 2017-01-11 DIAGNOSIS — C679 Malignant neoplasm of bladder, unspecified: Secondary | ICD-10-CM | POA: Diagnosis not present

## 2017-01-11 DIAGNOSIS — Z5111 Encounter for antineoplastic chemotherapy: Secondary | ICD-10-CM | POA: Diagnosis not present

## 2017-01-11 DIAGNOSIS — C689 Malignant neoplasm of urinary organ, unspecified: Secondary | ICD-10-CM

## 2017-01-11 DIAGNOSIS — I25810 Atherosclerosis of coronary artery bypass graft(s) without angina pectoris: Secondary | ICD-10-CM | POA: Insufficient documentation

## 2017-01-11 DIAGNOSIS — E039 Hypothyroidism, unspecified: Secondary | ICD-10-CM | POA: Diagnosis not present

## 2017-01-11 DIAGNOSIS — M1990 Unspecified osteoarthritis, unspecified site: Secondary | ICD-10-CM | POA: Diagnosis not present

## 2017-01-11 DIAGNOSIS — Z955 Presence of coronary angioplasty implant and graft: Secondary | ICD-10-CM | POA: Insufficient documentation

## 2017-01-11 DIAGNOSIS — D509 Iron deficiency anemia, unspecified: Secondary | ICD-10-CM

## 2017-01-11 DIAGNOSIS — R001 Bradycardia, unspecified: Secondary | ICD-10-CM

## 2017-01-11 DIAGNOSIS — E669 Obesity, unspecified: Secondary | ICD-10-CM | POA: Diagnosis not present

## 2017-01-11 DIAGNOSIS — I10 Essential (primary) hypertension: Secondary | ICD-10-CM | POA: Insufficient documentation

## 2017-01-11 DIAGNOSIS — K219 Gastro-esophageal reflux disease without esophagitis: Secondary | ICD-10-CM | POA: Diagnosis not present

## 2017-01-11 DIAGNOSIS — I252 Old myocardial infarction: Secondary | ICD-10-CM | POA: Insufficient documentation

## 2017-01-11 DIAGNOSIS — Z7982 Long term (current) use of aspirin: Secondary | ICD-10-CM | POA: Diagnosis not present

## 2017-01-11 DIAGNOSIS — Z5112 Encounter for antineoplastic immunotherapy: Secondary | ICD-10-CM

## 2017-01-11 DIAGNOSIS — E785 Hyperlipidemia, unspecified: Secondary | ICD-10-CM | POA: Insufficient documentation

## 2017-01-11 DIAGNOSIS — Z79899 Other long term (current) drug therapy: Secondary | ICD-10-CM | POA: Diagnosis not present

## 2017-01-11 DIAGNOSIS — Z794 Long term (current) use of insulin: Secondary | ICD-10-CM | POA: Insufficient documentation

## 2017-01-11 DIAGNOSIS — Z905 Acquired absence of kidney: Secondary | ICD-10-CM | POA: Insufficient documentation

## 2017-01-11 DIAGNOSIS — E119 Type 2 diabetes mellitus without complications: Secondary | ICD-10-CM | POA: Diagnosis not present

## 2017-01-11 DIAGNOSIS — Z87891 Personal history of nicotine dependence: Secondary | ICD-10-CM | POA: Insufficient documentation

## 2017-01-11 DIAGNOSIS — F419 Anxiety disorder, unspecified: Secondary | ICD-10-CM | POA: Insufficient documentation

## 2017-01-11 DIAGNOSIS — R109 Unspecified abdominal pain: Secondary | ICD-10-CM

## 2017-01-11 DIAGNOSIS — F329 Major depressive disorder, single episode, unspecified: Secondary | ICD-10-CM | POA: Insufficient documentation

## 2017-01-11 DIAGNOSIS — R1084 Generalized abdominal pain: Secondary | ICD-10-CM

## 2017-01-11 LAB — CBC WITH DIFFERENTIAL/PLATELET
BASOS ABS: 0.1 10*3/uL (ref 0–0.1)
BASOS PCT: 2 %
Eosinophils Absolute: 0.3 10*3/uL (ref 0–0.7)
Eosinophils Relative: 5 %
HEMATOCRIT: 32 % — AB (ref 35.0–47.0)
HEMOGLOBIN: 10.6 g/dL — AB (ref 12.0–16.0)
LYMPHS PCT: 31 %
Lymphs Abs: 1.7 10*3/uL (ref 1.0–3.6)
MCH: 26.3 pg (ref 26.0–34.0)
MCHC: 33.2 g/dL (ref 32.0–36.0)
MCV: 79.4 fL — AB (ref 80.0–100.0)
MONOS PCT: 6 %
Monocytes Absolute: 0.3 10*3/uL (ref 0.2–0.9)
NEUTROS ABS: 3 10*3/uL (ref 1.4–6.5)
NEUTROS PCT: 56 %
Platelets: 314 10*3/uL (ref 150–440)
RBC: 4.03 MIL/uL (ref 3.80–5.20)
RDW: 17.1 % — ABNORMAL HIGH (ref 11.5–14.5)
WBC: 5.4 10*3/uL (ref 3.6–11.0)

## 2017-01-11 LAB — COMPREHENSIVE METABOLIC PANEL
ALBUMIN: 2.9 g/dL — AB (ref 3.5–5.0)
ALK PHOS: 61 U/L (ref 38–126)
ALT: 10 U/L — ABNORMAL LOW (ref 14–54)
AST: 17 U/L (ref 15–41)
Anion gap: 6 (ref 5–15)
BILIRUBIN TOTAL: 0.4 mg/dL (ref 0.3–1.2)
BUN: 10 mg/dL (ref 6–20)
CALCIUM: 8.6 mg/dL — AB (ref 8.9–10.3)
CO2: 27 mmol/L (ref 22–32)
CREATININE: 0.98 mg/dL (ref 0.44–1.00)
Chloride: 105 mmol/L (ref 101–111)
GFR calc Af Amer: >60 mL/min (ref >60)
GFR calc non Af Amer: 58 mL/min — ABNORMAL LOW (ref >60)
GLUCOSE: 91 mg/dL (ref 65–99)
Potassium: 4.1 mmol/L (ref 3.5–5.1)
Sodium: 138 mmol/L (ref 135–145)
TOTAL PROTEIN: 6 g/dL — AB (ref 6.5–8.1)

## 2017-01-11 LAB — IRON AND TIBC
Iron: 26 ug/dL — ABNORMAL LOW (ref 28–170)
Saturation Ratios: 9 % — ABNORMAL LOW (ref 10.4–31.8)
TIBC: 292 ug/dL (ref 250–450)
UIBC: 266 ug/dL

## 2017-01-11 LAB — FERRITIN: Ferritin: 26 ng/mL (ref 11–307)

## 2017-01-11 MED ORDER — SODIUM CHLORIDE 0.9% FLUSH
10.0000 mL | Freq: Once | INTRAVENOUS | Status: AC
  Administered 2017-01-11: 10 mL via INTRAVENOUS
  Filled 2017-01-11: qty 10

## 2017-01-11 MED ORDER — SODIUM CHLORIDE 0.9 % IV SOLN
Freq: Once | INTRAVENOUS | Status: AC
  Administered 2017-01-11: 11:00:00 via INTRAVENOUS
  Filled 2017-01-11: qty 1000

## 2017-01-11 MED ORDER — SODIUM CHLORIDE 0.9 % IV SOLN
200.0000 mg | Freq: Once | INTRAVENOUS | Status: AC
  Administered 2017-01-11: 200 mg via INTRAVENOUS
  Filled 2017-01-11: qty 8

## 2017-01-11 MED ORDER — HEPARIN SOD (PORK) LOCK FLUSH 100 UNIT/ML IV SOLN: 500.0000 [IU] | Freq: Once | INTRAVENOUS | Status: DC

## 2017-01-11 MED ORDER — HEPARIN SOD (PORK) LOCK FLUSH 100 UNIT/ML IV SOLN
500.0000 [IU] | Freq: Once | INTRAVENOUS | Status: AC | PRN
  Administered 2017-01-11: 500 [IU]

## 2017-01-11 MED ORDER — FERUMOXYTOL INJECTION 510 MG/17 ML
510.0000 mg | Freq: Once | INTRAVENOUS | Status: AC
  Administered 2017-01-11: 510 mg via INTRAVENOUS
  Filled 2017-01-11: qty 17

## 2017-01-11 NOTE — Progress Notes (Signed)
Patient reports decreased appetite today. Patient reports episodes of low blood sugar, followed by endocrinology. Patient states pain is controlled at this time on current medications, still has itching that she is taking Ativan for at this time. Patients HR 40 today, states cardiologist is aware and adjusting medications.

## 2017-01-11 NOTE — Progress Notes (Signed)
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 Erlene Quan in March 2018. In late February 2018 patient presented with acute onset left-sided flank pain and was found to have a large 6 x 6.6 x 6 1 cm mid to left upper pole renal mass on which was directly fading the left adrenal gland measuring 3.1 x 3.9 cm. There was no obvious renal vein invasion. No intra-abdominal adenopathy was noted and chest x-ray did not reveal any obvious pulmonary disease  2. Patient underwent left radical nephrectomy on 10/02/2016. During the time of surgery the mass appeared incredibly fibrotic, desmoplastic reaction was noted in the medial aspect of the kidney in the hilar area involving the upper part of the hilum and the adrenal gland. There was also concern for renal vein invasion. Distal ureterectomy and lymph node dissection was not performed thinking that this was possibly a renal cell carcinoma.  3. DIAGNOSIS:  A. LEFT KIDNEY; ROBOTIC-ASSISTED LAPAROSCOPIC RADICAL NEPHRECTOMY:  - UROTHELIAL CARCINOMA, HIGH-GRADE.  - LYMPHOVASCULAR AND PERINEURAL INVASION PRESENT.  - THE HILAR MARGIN IS POSITIVE.  - THE URETERAL AND GEROTA'S FASCIA MARGINS ARE NEGATIVE.  - SEE SUMMARY BELOW.   Surgical Pathology Cancer Case Summary   RENAL PELVIS AND URETER:  Procedure: Robotic-assisted laparoscopic radical nephrectomy  Specimen Laterality: Left  Tumor Site: Hilum, upper and lower poles  Histologic Type: Urothelial carcinoma  Histologic  Grade: High grade  Tumor Extension: Tumor invades adjacent organs (adrenal gland) and  through the kidney into the perinephric fat  Margins: Hilar margin positive; ureteral and Gerota's fasciamargins  negative  Lymphovascular Invasion: Present  Regional Lymph Nodes: No lymph nodes submitted or found  Pathologic Stage Classification (pTNM, AJCC 8th Edition): pT4 pNX  TNM Descriptors: Not applicable  4. Discussed patient's case with Dr. Erlene Quan urology. She does not think that repeat surgery for distal ureterecomy and LN dissection can be performed given patients performance status and comorbidities. Patient lives with her sister and has limited mobility around the house. She is still complaining of abdominal pain which is persistent and of unclear etiology  5. Case discussed at tumor board and pathology slides and images reviewed. Repeat ct chest/abdomen does not reveal gross disease post surgery. However, operative findings suggest locally advanced carcinoma with positive margins suggestive of residual disease. LN not assessed although likely involved making this stage IV. Cystoscopy in may 2018 was NED  Interval history- abdominal pain well controlled. Reports some loss of appetite. She has not been getting much out of bed. Reports 2-3 falsl in the last 3 weeks while trying to get OOB  ECOG PS- 2 Pain scale- 0 Opioid associated constipation- no  Review of systems- Review of Systems  Constitutional: Positive for malaise/fatigue and weight loss. Negative for chills and fever.  HENT: Negative for congestion, ear discharge and nosebleeds.   Eyes: Negative for blurred vision.  Respiratory: Negative for cough, hemoptysis, sputum production, shortness of breath and wheezing.   Cardiovascular: Negative for chest pain, palpitations, orthopnea and claudication.  Gastrointestinal: Negative for abdominal pain, blood in stool, constipation,  diarrhea, heartburn, melena, nausea and vomiting.    Genitourinary: Negative for dysuria, flank pain, frequency, hematuria and urgency.  Musculoskeletal: Negative for back pain, joint pain and myalgias.  Skin: Negative for rash.  Neurological: Negative for dizziness, tingling, focal weakness, seizures, weakness and headaches.  Endo/Heme/Allergies: Does not bruise/bleed easily.  Psychiatric/Behavioral: Negative for depression and suicidal ideas. The patient does not have insomnia.       Allergies  Allergen Reactions  . Norco [Hydrocodone-Acetaminophen] Other (See Comments)    hallucinations hallucinations  . Codeine Phosphate Other (See Comments)    REACTION: unspecified  . Duloxetine Other (See Comments)    REACTION: unspecified  . Meperidine Hcl Other (See Comments)    REACTION: unspecified  . Venlafaxine Other (See Comments)    REACTION: unspecified  . Doxycycline Hives and Rash     Past Medical History:  Diagnosis Date  . Adenomatous polyp 1999  . Anxiety   . Arthritis    Left knee  . Bacteremia    a. 03/2016 admitted with MRSA/GAS bacteremia following fall and dog bites;  b. 03/2017 TEE: EF 55-60%, triv AI, mild MR, no veg.  Marland Kitchen CAD (coronary artery disease) of artery bypass graft    a. NSTEMI in 07/2015: Cath showed 99% stenosis of distal LCx  --> DES placed;  b. 08/2016 Lexiscan MV: no ischemia, EF 36% (likely 2/2 GI uptake/artifact).  . Carotid arterial disease ([place])    a. 2011 s/p R CEA.  . Depression    Paranoid tendencies  . Diverticulosis   . GERD (gastroesophageal reflux disease)   . Hyperlipidemia   . Hypertension   . Hypothyroidism   . Myocardial infarction ([place]) 2017  . Obesity   . Type II diabetes mellitus ([place])   . Urothelial carcinoma of kidney, left ([place])    a. 08/2016 s/p L nephrectomy-->now on immunoRx.     Past Surgical History:  Procedure Laterality Date  . ABDOMINAL HYSTERECTOMY  1977   One ovary left  . APPENDECTOMY    . BLADDER REPAIR  1977   Tacking  . CARDIAC CATHETERIZATION N/A  08/01/2015   Procedure: Left Heart Cath and Coronary Angiography;  Surgeon: Wellington Hampshire, MD;  Location: [place] [facility];  Service: Cardiovascular;  Laterality: N/A;  . CARDIAC CATHETERIZATION N/A 08/01/2015   Procedure: Coronary Stent Intervention;  Surgeon: Wellington Hampshire, MD;  Location: [place] [facility];  Service: Cardiovascular;  Laterality: N/A;  . CARDIOVASCULAR STRESS TEST  7/00   negative. No cardiolite  . CAROTID ENDARTERECTOMY  2/11   Dr Jamal Collin  . [place]  . CHOLECYSTECTOMY    . CT ABD W & PELVIS WO CM  2002   Negative. Pituitary normal on MRI  . HEEL SPUR SURGERY  03/03   Left  . INTRAMEDULLARY (IM) NAIL INTERTROCHANTERIC Left 08/04/2015   Procedure: INTRAMEDULLARY (IM) NAIL INTERTROCHANTRIC;  Surgeon: Hessie Knows, MD;  Location: [facility];  Service: Orthopedics;  Laterality: Left;  . IR FLUORO GUIDE PORT INSERTION RIGHT  11/09/2016  . KNEE ARTHROSCOPY  2001   Left   Tamala Julian)  . RENAL MASS EXCISION Left april10/18   per pt left kidney removed -cancerous per pt /family  . ROBOT ASSISTED LAPAROSCOPIC NEPHRECTOMY Left 10/02/2016   Procedure: ROBOTIC ASSISTED LAPAROSCOPIC RADICAL NEPHRECTOMY;  Surgeon: Hollice Espy, MD;  Location: [facility];  Service: Urology;  Laterality: Left;  . TEE WITHOUT CARDIOVERSION N/A 04/11/2016   Procedure: TRANSESOPHAGEAL ECHOCARDIOGRAM (TEE);  Surgeon: Teodoro Spray, MD;  Location: [facility];  Service: Cardiovascular;  Laterality: N/A;  . TOTAL KNEE ARTHROPLASTY  11/12   left--Dr Tamala Julian  . TRANSTHORACIC ECHOCARDIOGRAM  3/04   normal    Social History   Social History  . Marital status: Widowed    Spouse name: N/A  . Number of children: 3  . Years of education: N/A   Occupational History  . Receptionist---retired     [place]  .     [employer] [employer]      Social History Main Topics  . Smoking status: Former Smoker    Packs/day: 2.00    Years: 15.00    Types: Cigarettes    Quit date: 09/06/1986  . Smokeless  tobacco: Never Used  . Alcohol use No  . Drug use: No  . Sexual activity: No   Other Topics Concern  . Not on file   Social History Narrative   Widowed 07/02.  Kids have moved out and now living with friend to share expenses.      3 caffeine drinks daily     Family History  Problem Relation Age of Onset  . Cancer Mother        Breast  . Heart disease Mother   . Diabetes Mother   . Colon polyps Mother   . Heart disease Father   . Diabetes Father   . Hypertension Sister   . Diabetes Sister   . Depression Daughter   . Cancer Paternal Grandmother        ? throat CA  . Colon cancer Unknown        Unsure if mother had it   . Rheum arthritis Sister   . Bladder Cancer Neg Hx   . Kidney cancer Neg Hx      Current Outpatient Prescriptions:  .  ACCU-CHEK SMARTVIEW test strip, , Disp: , Rfl:  .  aspirin EC 81 MG tablet, Take 1 tablet (81 mg total) by mouth daily., Disp: 90 tablet, Rfl: 3 .  atorvastatin (LIPITOR) 40 MG tablet, Take 40 mg by mouth daily., Disp: , Rfl:  .  citalopram (CELEXA) 40 MG tablet, Take 40 mg by mouth daily. In am., Disp: , Rfl:  .  docusate sodium (COLACE) 100 MG capsule, Take 1 capsule (100 mg total) by mouth 2 (two) times daily., Disp: 60 capsule, Rfl: 0 .  hydrochlorothiazide (HYDRODIURIL) 25 MG tablet, Take 25 mg by mouth daily., Disp: , Rfl:  .  insulin aspart (NOVOLOG) 100 UNIT/ML FlexPen, Inject into the skin., Disp: , Rfl:  .  insulin glargine (LANTUS) 100 UNIT/ML injection, Inject 24 Units into the skin at bedtime., Disp: , Rfl:  .  levothyroxine (SYNTHROID, LEVOTHROID) 100 MCG tablet, Take 1 tablet (100 mcg total) by mouth daily., Disp: 90 tablet, Rfl: 1 .  lidocaine-prilocaine (EMLA) cream, Apply to affected area once, Disp: 30 g, Rfl: 3 .  lisinopril (PRINIVIL,ZESTRIL) 20 MG tablet, Take 20 mg by mouth daily., Disp: , Rfl:  .  LORazepam (ATIVAN) 0.5 MG tablet, Take 1 tablet (0.5 mg total) by mouth every 6 (six) hours as needed (Nausea or  vomiting)., Disp: 30 tablet, Rfl: 0 .  metoprolol tartrate (LOPRESSOR) 25 MG tablet, Take 0.5 tablets (12.5 mg total) by mouth daily., Disp: 30 tablet, Rfl: 3 .  morphine (MS CONTIN) 15 MG 12 hr tablet, Take 1 tablet (15 mg total) by mouth every 12 (twelve) hours., Disp: 30 tablet, Rfl: 0 .  ondansetron (ZOFRAN) 4 MG tablet, Take 1 tablet (4 mg  total) by mouth every 6 (six) hours as needed for nausea or vomiting., Disp: 30 tablet, Rfl: 0 .  ondansetron (ZOFRAN) 8 MG tablet, Take 1 tablet (8 mg total) by mouth 2 (two) times daily as needed (Nausea or vomiting)., Disp: 30 tablet, Rfl: 1 .  Oxycodone HCl 10 MG TABS, Take 1 tablet (10 mg total) by mouth every 4 (four) hours as needed., Disp: 60 tablet, Rfl: 0 .  prochlorperazine (COMPAZINE) 10 MG tablet, Take 1 tablet (10 mg total) by mouth every 6 (six) hours as needed (Nausea or vomiting)., Disp: 30 tablet, Rfl: 1 .  traZODone (DESYREL) 100 MG tablet, Take 100 mg by mouth at bedtime., Disp: , Rfl:  No current facility-administered medications for this visit.   Facility-Administered Medications Ordered in Other Visits:  .  heparin lock flush 100 unit/mL, 500 Units, Intravenous, Once, Sindy Guadeloupe, MD .  sodium chloride flush (NS) 0.9 % injection 10 mL, 10 mL, Intravenous, Once, Sindy Guadeloupe, MD .  sodium chloride flush (NS) 0.9 % injection 10 mL, 10 mL, Intravenous, Once, Sindy Guadeloupe, MD  Physical exam:  Vitals:   01/11/17 0958  BP: (!) 139/53  Pulse: (!) 40  Resp: 20  Temp: 97.7 F (36.5 C)  TempSrc: Tympanic  Weight: 179 lb (81.2 kg)   Physical Exam  Constitutional: She is oriented to person, place, and time.  Patient sitting in a wheelchair. Appears in no acute distress  HENT:  Head: Normocephalic and atraumatic.  Eyes: Pupils are equal, round, and reactive to light. EOM are normal.  Neck: Normal range of motion.  Cardiovascular: Normal rate, regular rhythm and normal heart sounds.   Pulmonary/Chest: Effort normal and breath  sounds normal.  Abdominal: Soft. Bowel sounds are normal.  Abdominal scar of surgery well healed  Neurological: She is alert and oriented to person, place, and time.  Skin: Skin is warm and dry.     CMP Latest Ref Rng & Units 01/11/2017  Glucose 65 - 99 mg/dL 91  BUN 6 - 20 mg/dL 10  Creatinine 0.44 - 1.00 mg/dL 0.98  Sodium 135 - 145 mmol/L 138  Potassium 3.5 - 5.1 mmol/L 4.1  Chloride 101 - 111 mmol/L 105  CO2 22 - 32 mmol/L 27  Calcium 8.9 - 10.3 mg/dL 8.6(L)  Total Protein 6.5 - 8.1 g/dL 6.0(L)  Total Bilirubin 0.3 - 1.2 mg/dL 0.4  Alkaline Phos 38 - 126 U/L 61  AST 15 - 41 U/L 17  ALT 14 - 54 U/L 10(L)   CBC Latest Ref Rng & Units 01/11/2017  WBC 3.6 - 11.0 K/uL 5.4  Hemoglobin 12.0 - 16.0 g/dL 10.6(L)  Hematocrit 35.0 - 47.0 % 32.0(L)  Platelets 150 - 440 K/uL 314      Assessment and plan- Patient is a 66 y.o. female with locally advanced upper urothelial carcinoma likely stage IV s/p surgery on palliative keytruda  Counts ok to proceed with next cycle 4 of [place] today. Will check tsh with next visit  Abdominal pain- etiology unclear. Continue fenatnyl patch and prn oxycodone  Microcytic anemia- iron studies from today pending. Will give 1 dose of feraheme today along with keytruda  Bradycardia- seen by cardiology. Metoprolol dose decreased recently. Continue to monitor. If remains low, she needs to get in touch with them again  rtc in 3 weeks with labs for next cycle of keytruda. She will see covering doc/ NP. Get feraheme next time as well. I will see her back in 6  weeks for cycle 6 of keytruda   Visit Diagnosis 1. Urothelial cancer ([place])   2. Encounter for antineoplastic immunotherapy   3. Microcytic anemia   4. Generalized abdominal pain      Dr. Randa Evens, MD, MPH [facility] Pager- [phone] 01/11/2017 10:12 AM

## 2017-01-18 ENCOUNTER — Telehealth: Payer: Self-pay | Admitting: Cardiovascular Disease

## 2017-01-18 NOTE — Telephone Encounter (Signed)
S/w PT, Anita Small, who reports HR has been 47-48 during his past three visits. Daughter and PT are with the patient at this time and daughter reports pt has been taking metoprolol 12.5mg  QD. I reviewed with [place], PA, who advises to discontinue metoprolol. Family will monitor BP and HR and report any concerns. Daughter and Anita Small verbalized understanding with no further questions at this time. Medication list updated.

## 2017-01-18 NOTE — Telephone Encounter (Signed)
Pt's physical therapist Skeet Simmer calling to report vitals Pulse has been low around 47 and 48 He would like parameters faxed to [phone]

## 2017-01-21 ENCOUNTER — Other Ambulatory Visit: Payer: Self-pay | Admitting: *Deleted

## 2017-01-21 ENCOUNTER — Telehealth: Payer: Self-pay | Admitting: *Deleted

## 2017-01-21 DIAGNOSIS — C689 Malignant neoplasm of urinary organ, unspecified: Secondary | ICD-10-CM

## 2017-01-21 DIAGNOSIS — Z5112 Encounter for antineoplastic immunotherapy: Secondary | ICD-10-CM

## 2017-01-21 DIAGNOSIS — G893 Neoplasm related pain (acute) (chronic): Secondary | ICD-10-CM

## 2017-01-21 MED ORDER — MORPHINE SULFATE ER 15 MG PO TBCR: 15.0000 mg | EXTENDED_RELEASE_TABLET | Freq: Two times a day (BID) | ORAL | 0 refills | Status: DC

## 2017-01-21 MED ORDER — LORAZEPAM 0.5 MG PO TABS: 0.5000 mg | ORAL_TABLET | Freq: Four times a day (QID) | ORAL | 0 refills | Status: DC | PRN

## 2017-01-21 MED ORDER — OXYCODONE HCL 10 MG PO TABS: 10.0000 mg | ORAL_TABLET | ORAL | 0 refills | Status: DC | PRN

## 2017-01-21 NOTE — Telephone Encounter (Signed)
Called daughter and told her that I got message that her mother needed refill of ms contin, oxycodone, and lorazepam. Orlando Penner will come pick it up today

## 2017-01-21 NOTE — Telephone Encounter (Signed)
-----   Message from Manus Rudd, RN sent at 01/21/2017  1:31 PM EDT ----- Regarding: Ignore that last message! This is the correct patient she needs morphine. Oxycodone and lorazapam she can pick it up today in [place].     [place]

## 2017-02-01 ENCOUNTER — Encounter: Payer: Self-pay | Admitting: Oncology

## 2017-02-01 ENCOUNTER — Inpatient Hospital Stay: Payer: Medicare HMO | Attending: Oncology | Admitting: Oncology

## 2017-02-01 ENCOUNTER — Inpatient Hospital Stay: Payer: Medicare HMO

## 2017-02-01 VITALS — BP 110/54 | Temp 98.1°F | Resp 60 | Ht 62.0 in | Wt 178.1 lb

## 2017-02-01 DIAGNOSIS — R109 Unspecified abdominal pain: Secondary | ICD-10-CM | POA: Diagnosis not present

## 2017-02-01 DIAGNOSIS — C689 Malignant neoplasm of urinary organ, unspecified: Secondary | ICD-10-CM | POA: Insufficient documentation

## 2017-02-01 DIAGNOSIS — Z87891 Personal history of nicotine dependence: Secondary | ICD-10-CM | POA: Diagnosis not present

## 2017-02-01 DIAGNOSIS — M1990 Unspecified osteoarthritis, unspecified site: Secondary | ICD-10-CM | POA: Insufficient documentation

## 2017-02-01 DIAGNOSIS — D509 Iron deficiency anemia, unspecified: Secondary | ICD-10-CM

## 2017-02-01 DIAGNOSIS — E039 Hypothyroidism, unspecified: Secondary | ICD-10-CM | POA: Diagnosis not present

## 2017-02-01 DIAGNOSIS — Z955 Presence of coronary angioplasty implant and graft: Secondary | ICD-10-CM | POA: Diagnosis not present

## 2017-02-01 DIAGNOSIS — R32 Unspecified urinary incontinence: Secondary | ICD-10-CM | POA: Insufficient documentation

## 2017-02-01 DIAGNOSIS — I252 Old myocardial infarction: Secondary | ICD-10-CM | POA: Insufficient documentation

## 2017-02-01 DIAGNOSIS — E119 Type 2 diabetes mellitus without complications: Secondary | ICD-10-CM | POA: Insufficient documentation

## 2017-02-01 DIAGNOSIS — F329 Major depressive disorder, single episode, unspecified: Secondary | ICD-10-CM | POA: Diagnosis not present

## 2017-02-01 DIAGNOSIS — E785 Hyperlipidemia, unspecified: Secondary | ICD-10-CM | POA: Insufficient documentation

## 2017-02-01 DIAGNOSIS — Z5112 Encounter for antineoplastic immunotherapy: Secondary | ICD-10-CM

## 2017-02-01 DIAGNOSIS — F419 Anxiety disorder, unspecified: Secondary | ICD-10-CM | POA: Insufficient documentation

## 2017-02-01 DIAGNOSIS — G893 Neoplasm related pain (acute) (chronic): Secondary | ICD-10-CM

## 2017-02-01 DIAGNOSIS — K219 Gastro-esophageal reflux disease without esophagitis: Secondary | ICD-10-CM | POA: Diagnosis not present

## 2017-02-01 DIAGNOSIS — I25810 Atherosclerosis of coronary artery bypass graft(s) without angina pectoris: Secondary | ICD-10-CM | POA: Insufficient documentation

## 2017-02-01 DIAGNOSIS — E669 Obesity, unspecified: Secondary | ICD-10-CM | POA: Insufficient documentation

## 2017-02-01 DIAGNOSIS — Z794 Long term (current) use of insulin: Secondary | ICD-10-CM | POA: Insufficient documentation

## 2017-02-01 DIAGNOSIS — I10 Essential (primary) hypertension: Secondary | ICD-10-CM | POA: Insufficient documentation

## 2017-02-01 DIAGNOSIS — Z7982 Long term (current) use of aspirin: Secondary | ICD-10-CM | POA: Diagnosis not present

## 2017-02-01 DIAGNOSIS — Z905 Acquired absence of kidney: Secondary | ICD-10-CM | POA: Insufficient documentation

## 2017-02-01 DIAGNOSIS — Z79899 Other long term (current) drug therapy: Secondary | ICD-10-CM | POA: Insufficient documentation

## 2017-02-01 DIAGNOSIS — Z5111 Encounter for antineoplastic chemotherapy: Secondary | ICD-10-CM | POA: Insufficient documentation

## 2017-02-01 LAB — CBC WITH DIFFERENTIAL/PLATELET
BASOS ABS: 0 10*3/uL (ref 0–0.1)
BASOS PCT: 1 %
EOS ABS: 0.3 10*3/uL (ref 0–0.7)
EOS PCT: 7 %
HCT: 32.9 % — ABNORMAL LOW (ref 35.0–47.0)
Hemoglobin: 11 g/dL — ABNORMAL LOW (ref 12.0–16.0)
Lymphocytes Relative: 21 %
Lymphs Abs: 0.8 10*3/uL — ABNORMAL LOW (ref 1.0–3.6)
MCH: 27 pg (ref 26.0–34.0)
MCHC: 33.4 g/dL (ref 32.0–36.0)
MCV: 80.8 fL (ref 80.0–100.0)
MONO ABS: 0.3 10*3/uL (ref 0.2–0.9)
Monocytes Relative: 7 %
Neutro Abs: 2.6 10*3/uL (ref 1.4–6.5)
Neutrophils Relative %: 64 %
PLATELETS: 259 10*3/uL (ref 150–440)
RBC: 4.07 MIL/uL (ref 3.80–5.20)
RDW: 18 % — AB (ref 11.5–14.5)
WBC: 4.1 10*3/uL (ref 3.6–11.0)

## 2017-02-01 LAB — URINALYSIS, COMPLETE (UACMP) WITH MICROSCOPIC
BACTERIA UA: NONE SEEN
BILIRUBIN URINE: NEGATIVE
Glucose, UA: NEGATIVE mg/dL
HGB URINE DIPSTICK: NEGATIVE
KETONES UR: NEGATIVE mg/dL
LEUKOCYTES UA: NEGATIVE
NITRITE: NEGATIVE
PH: 7 (ref 5.0–8.0)
Protein, ur: NEGATIVE mg/dL
SPECIFIC GRAVITY, URINE: 1.01 (ref 1.005–1.030)

## 2017-02-01 LAB — COMPREHENSIVE METABOLIC PANEL
ALBUMIN: 2.9 g/dL — AB (ref 3.5–5.0)
ALT: 9 U/L — ABNORMAL LOW (ref 14–54)
AST: 13 U/L — AB (ref 15–41)
Alkaline Phosphatase: 55 U/L (ref 38–126)
Anion gap: 9 (ref 5–15)
BUN: 10 mg/dL (ref 6–20)
CHLORIDE: 100 mmol/L — AB (ref 101–111)
CO2: 27 mmol/L (ref 22–32)
Calcium: 8.4 mg/dL — ABNORMAL LOW (ref 8.9–10.3)
Creatinine, Ser: 0.88 mg/dL (ref 0.44–1.00)
GFR calc Af Amer: >60 mL/min (ref >60)
Glucose, Bld: 166 mg/dL — ABNORMAL HIGH (ref 65–99)
POTASSIUM: 4 mmol/L (ref 3.5–5.1)
SODIUM: 136 mmol/L (ref 135–145)
Total Bilirubin: 0.5 mg/dL (ref 0.3–1.2)
Total Protein: 6 g/dL — ABNORMAL LOW (ref 6.5–8.1)

## 2017-02-01 LAB — TSH: TSH: 0.36 u[IU]/mL (ref 0.350–4.500)

## 2017-02-01 MED ORDER — PEMBROLIZUMAB CHEMO INJECTION 100 MG/4ML
200.0000 mg | Freq: Once | INTRAVENOUS | Status: AC
  Administered 2017-02-01: 200 mg via INTRAVENOUS
  Filled 2017-02-01: qty 8

## 2017-02-01 MED ORDER — HEPARIN SOD (PORK) LOCK FLUSH 100 UNIT/ML IV SOLN
500.0000 [IU] | Freq: Once | INTRAVENOUS | Status: AC | PRN
  Administered 2017-02-01: 500 [IU]

## 2017-02-01 MED ORDER — LORAZEPAM 0.5 MG PO TABS: 0.5000 mg | ORAL_TABLET | Freq: Four times a day (QID) | ORAL | 0 refills | Status: DC | PRN

## 2017-02-01 MED ORDER — SODIUM CHLORIDE 0.9 % IV SOLN
Freq: Once | INTRAVENOUS | Status: DC
  Filled 2017-02-01: qty 1000

## 2017-02-01 MED ORDER — SODIUM CHLORIDE 0.9 % IV SOLN
510.0000 mg | Freq: Once | INTRAVENOUS | Status: AC
  Administered 2017-02-01: 510 mg via INTRAVENOUS
  Filled 2017-02-01: qty 17

## 2017-02-01 MED ORDER — ONDANSETRON HCL 8 MG PO TABS
8.0000 mg | ORAL_TABLET | Freq: Two times a day (BID) | ORAL | 2 refills | Status: AC | PRN
Start: 2017-02-01 — End: ?

## 2017-02-01 MED ORDER — SODIUM CHLORIDE 0.9% FLUSH
10.0000 mL | INTRAVENOUS | Status: DC | PRN
Start: 2017-02-01 — End: 2017-02-01
  Administered 2017-02-01: 10 mL
  Filled 2017-02-01: qty 10

## 2017-02-01 MED ORDER — HEPARIN SOD (PORK) LOCK FLUSH 100 UNIT/ML IV SOLN
INTRAVENOUS | Status: AC
  Filled 2017-02-01: qty 5

## 2017-02-01 MED ORDER — SODIUM CHLORIDE 0.9 % IV SOLN
Freq: Once | INTRAVENOUS | Status: AC
Start: 2017-02-01 — End: 2017-02-01
  Administered 2017-02-01: 10:00:00 via INTRAVENOUS
  Filled 2017-02-01: qty 1000

## 2017-02-01 NOTE — Progress Notes (Signed)
Itching is a prolbem and she has open sores on her body and daughter concerned about mrsa because she has it before.  Incontinence this week has been ongoing with urine.

## 2017-02-01 NOTE — Progress Notes (Signed)
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 Erlene Quan in March 2018. In late February 2018 patient presented with acute onset left-sided flank pain and was found to have a large 6 x 6.6 x 6 1 cm mid to left upper pole renal mass on which was directly fading the left adrenal gland measuring 3.1 x 3.9 cm. There was no obvious renal vein invasion. No intra-abdominal adenopathy was noted and chest x-ray did not reveal any obvious pulmonary disease  2. Patient underwent left radical nephrectomy on 10/02/2016. During the time of surgery the mass appeared incredibly fibrotic, desmoplastic reaction was noted in the medial aspect of the kidney in the hilar area involving the upper part of the hilum and the adrenal gland. There was also concern for renal vein invasion. Distal ureterectomy and lymph node dissection was not performed thinking that this was possibly a renal cell carcinoma.  3. DIAGNOSIS:  A. LEFT KIDNEY; ROBOTIC-ASSISTED LAPAROSCOPIC RADICAL NEPHRECTOMY:  - UROTHELIAL CARCINOMA, HIGH-GRADE.  - LYMPHOVASCULAR AND PERINEURAL INVASION PRESENT.  - THE HILAR MARGIN IS POSITIVE.  - THE URETERAL AND GEROTA'S FASCIA MARGINS ARE NEGATIVE.  - SEE SUMMARY BELOW.   Surgical Pathology Cancer Case Summary   RENAL PELVIS AND URETER:  Procedure: Robotic-assisted laparoscopic radical nephrectomy  Specimen Laterality: Left  Tumor Site: Hilum, upper and lower poles  Histologic Type: Urothelial carcinoma  Histologic  Grade: High grade  Tumor Extension: Tumor invades adjacent organs (adrenal gland) and  through the kidney into the perinephric fat  Margins: Hilar margin positive; ureteral and Gerota's fasciamargins  negative  Lymphovascular Invasion: Present  Regional Lymph Nodes: No lymph nodes submitted or found  Pathologic Stage Classification (pTNM, AJCC 8th Edition): pT4 pNX  TNM Descriptors: Not applicable  4. Discussed patient's case with Dr. Erlene Quan urology. She does not think that repeat surgery for distal ureterecomy and LN dissection can be performed given patients performance status and comorbidities. Patient lives with her sister and has limited mobility around the house. She is still complaining of abdominal pain which is persistent and of unclear etiology  5. Case discussed at tumor board and pathology slides and images reviewed. Repeat ct chest/abdomen does not reveal gross disease post surgery. However, operative findings suggest locally advanced carcinoma with positive margins suggestive of residual disease. LN not assessed although likely involved making this stage IV. Cystoscopy in may 2018 was NED  Interval history- reporst generalized itching. Abdominal pain well controlled. Has been having urinary incontinence on and off for 1 week  ECOG PS- 2 Pain scale- 0 Opioid associated constipation- no  Review of systems- Review of Systems  Constitutional: Negative for chills, fever, malaise/fatigue and weight loss.  HENT: Negative for congestion, ear discharge and nosebleeds.   Eyes: Negative for blurred vision.  Respiratory: Negative for cough, hemoptysis, sputum production, shortness of breath and wheezing.   Cardiovascular: Negative for chest pain, palpitations, orthopnea and claudication.  Gastrointestinal: Negative for abdominal pain, blood in stool, constipation, diarrhea, heartburn, melena, nausea and vomiting.  Genitourinary: Negative for dysuria, flank pain, frequency, hematuria  and urgency.  Musculoskeletal: Negative for back pain, joint pain and myalgias.  Skin: Positive for itching. Negative for rash.  Neurological: Negative for dizziness, tingling, focal weakness, seizures, weakness and headaches.  Endo/Heme/Allergies: Does not bruise/bleed easily.  Psychiatric/Behavioral: Negative for depression and suicidal ideas. The patient does not have insomnia.       Allergies  Allergen Reactions  . Norco [Hydrocodone-Acetaminophen] Other (See Comments)    hallucinations hallucinations  . Codeine Phosphate Other (See Comments)    REACTION: unspecified  . Duloxetine Other (See Comments)    REACTION: unspecified  . Meperidine Hcl Other (See Comments)    REACTION: unspecified  . Venlafaxine Other (See Comments)    REACTION: unspecified  . Doxycycline Hives and Rash     Past Medical History:  Diagnosis Date  . Adenomatous polyp 1999  . Anxiety   . Arthritis    Left knee  . Bacteremia    a. 03/2016 admitted with MRSA/GAS bacteremia following fall and dog bites;  b. 03/2017 TEE: EF 55-60%, triv AI, mild MR, no veg.  Marland Kitchen CAD (coronary artery disease) of artery bypass graft    a. NSTEMI in 07/2015: Cath showed 99% stenosis of distal LCx  --> DES placed;  b. 08/2016 Lexiscan MV: no ischemia, EF 36% (likely 2/2 GI uptake/artifact).  . Carotid arterial disease ([place])    a. 2011 s/p R CEA.  . Depression    Paranoid tendencies  . Diverticulosis   . GERD (gastroesophageal reflux disease)   . Hyperlipidemia   . Hypertension   . Hypothyroidism   . Myocardial infarction ([place]) 2017  . Obesity   . Type II diabetes mellitus ([place])   . Urothelial carcinoma of kidney, left ([place])    a. 08/2016 s/p L nephrectomy-->now on immunoRx.     Past Surgical History:  Procedure Laterality Date  . ABDOMINAL HYSTERECTOMY  1977   One ovary left  . APPENDECTOMY    . BLADDER REPAIR  1977   Tacking  . CARDIAC CATHETERIZATION N/A 08/01/2015   Procedure: Left Heart Cath and Coronary  Angiography;  Surgeon: Wellington Hampshire, MD;  Location: [facility];  Service: Cardiovascular;  Laterality: N/A;  . CARDIAC CATHETERIZATION N/A 08/01/2015   Procedure: Coronary Stent Intervention;  Surgeon: Wellington Hampshire, MD;  Location: [facility];  Service: Cardiovascular;  Laterality: N/A;  . CARDIOVASCULAR STRESS TEST  7/00   negative. No cardiolite  . CAROTID ENDARTERECTOMY  2/11   Dr Jamal Collin  . [place]  . CHOLECYSTECTOMY    . CT ABD W & PELVIS WO CM  2002   Negative. Pituitary normal on MRI  . HEEL SPUR SURGERY  03/03   Left  . INTRAMEDULLARY (IM) NAIL INTERTROCHANTERIC Left 08/04/2015   Procedure: INTRAMEDULLARY (IM) NAIL INTERTROCHANTRIC;  Surgeon: Hessie Knows, MD;  Location: [facility];  Service: Orthopedics;  Laterality: Left;  . IR FLUORO GUIDE PORT INSERTION RIGHT  11/09/2016  . KNEE ARTHROSCOPY  2001   Left   Tamala Julian)  . RENAL MASS EXCISION Left april10/18   per pt left kidney removed -cancerous per pt /family  . ROBOT ASSISTED LAPAROSCOPIC NEPHRECTOMY Left 10/02/2016   Procedure: ROBOTIC ASSISTED LAPAROSCOPIC RADICAL NEPHRECTOMY;  Surgeon: Hollice Espy, MD;  Location: [facility];  Service: Urology;  Laterality: Left;  . TEE WITHOUT CARDIOVERSION N/A 04/11/2016   Procedure: TRANSESOPHAGEAL ECHOCARDIOGRAM (TEE);  Surgeon: Teodoro Spray, MD;  Location: [facility];  Service: Cardiovascular;  Laterality: N/A;  . TOTAL KNEE  ARTHROPLASTY  11/12   left--Dr Tamala Julian  . TRANSTHORACIC ECHOCARDIOGRAM  3/04   normal    Social History   Social History  . Marital status: Widowed    Spouse name: N/A  . Number of children: 3  . Years of education: N/A   Occupational History  . Receptionist---retired     [employer]  .     [employer]      Social History Main Topics  . Smoking status: Former Smoker    Packs/day: 2.00    Years: 15.00    Types: Cigarettes    Quit date: 09/06/1986  . Smokeless tobacco: Never Used  . Alcohol use No  . Drug use: No   . Sexual activity: No   Other Topics Concern  . Not on file   Social History Narrative   Widowed 07/02.  Kids have moved out and now living with friend to share expenses.      3 caffeine drinks daily     Family History  Problem Relation Age of Onset  . Cancer Mother        Breast  . Heart disease Mother   . Diabetes Mother   . Colon polyps Mother   . Heart disease Father   . Diabetes Father   . Hypertension Sister   . Diabetes Sister   . Depression Daughter   . Cancer Paternal Grandmother        ? throat CA  . Colon cancer Unknown        Unsure if mother had it   . Rheum arthritis Sister   . Bladder Cancer Neg Hx   . Kidney cancer Neg Hx      Current Outpatient Prescriptions:  .  ACCU-CHEK SMARTVIEW test strip, , Disp: , Rfl:  .  aspirin EC 81 MG tablet, Take 1 tablet (81 mg total) by mouth daily., Disp: 90 tablet, Rfl: 3 .  atorvastatin (LIPITOR) 40 MG tablet, Take 40 mg by mouth daily., Disp: , Rfl:  .  citalopram (CELEXA) 40 MG tablet, Take 20 mg by mouth daily. In am., Disp: , Rfl:  .  docusate sodium (COLACE) 100 MG capsule, Take 1 capsule (100 mg total) by mouth 2 (two) times daily., Disp: 60 capsule, Rfl: 0 .  hydrochlorothiazide (HYDRODIURIL) 25 MG tablet, Take 12.5 mg by mouth daily. , Disp: , Rfl:  .  insulin aspart (NOVOLOG) 100 UNIT/ML FlexPen, Inject into the skin., Disp: , Rfl:  .  insulin glargine (LANTUS) 100 UNIT/ML injection, Inject 24 Units into the skin at bedtime., Disp: , Rfl:  .  levothyroxine (SYNTHROID, LEVOTHROID) 100 MCG tablet, Take 1 tablet (100 mcg total) by mouth daily., Disp: 90 tablet, Rfl: 1 .  lidocaine-prilocaine (EMLA) cream, Apply to affected area once, Disp: 30 g, Rfl: 3 .  lisinopril (PRINIVIL,ZESTRIL) 20 MG tablet, Take 20 mg by mouth daily., Disp: , Rfl:  .  LORazepam (ATIVAN) 0.5 MG tablet, Take 1 tablet (0.5 mg total) by mouth every 6 (six) hours as needed (Nausea or vomiting)., Disp: 30 tablet, Rfl: 0 .  morphine (MS CONTIN)  15 MG 12 hr tablet, Take 1 tablet (15 mg total) by mouth every 12 (twelve) hours., Disp: 30 tablet, Rfl: 0 .  ondansetron (ZOFRAN) 4 MG tablet, Take 1 tablet (4 mg total) by mouth every 6 (six) hours as needed for nausea or vomiting. (Patient not taking: Reported on 01/11/2017), Disp: 30 tablet, Rfl: 0 .  ondansetron (ZOFRAN) 8 MG tablet, Take 1 tablet (  8 mg total) by mouth 2 (two) times daily as needed (Nausea or vomiting). (Patient not taking: Reported on 01/11/2017), Disp: 30 tablet, Rfl: 1 .  Oxycodone HCl 10 MG TABS, Take 1 tablet (10 mg total) by mouth every 4 (four) hours as needed., Disp: 60 tablet, Rfl: 0 .  prochlorperazine (COMPAZINE) 10 MG tablet, Take 1 tablet (10 mg total) by mouth every 6 (six) hours as needed (Nausea or vomiting)., Disp: 30 tablet, Rfl: 1 .  traZODone (DESYREL) 100 MG tablet, Take 100 mg by mouth at bedtime., Disp: , Rfl:   Physical exam:  Vitals:   02/01/17 0945  BP: (!) 110/54  Resp: (!) 60  Temp: 98.1 F (36.7 C)  TempSrc: Tympanic  Weight: 178 lb 1.6 oz (80.8 kg)  Height: 5\' 2"  (1.575 m)   Physical Exam  Constitutional: She is oriented to person, place, and time and well-developed, well-nourished, and in no distress.  Sitting in a wheelchair  HENT:  Head: Normocephalic and atraumatic.  Eyes: Pupils are equal, round, and reactive to light. EOM are normal.  Neck: Normal range of motion.  Cardiovascular: Normal rate, regular rhythm and normal heart sounds.   Pulmonary/Chest: Effort normal and breath sounds normal.  Abdominal: Soft. Bowel sounds are normal.  Neurological: She is alert and oriented to person, place, and time.  Skin: Skin is warm and dry.  Scattered few excoriating lesions noted over back     CMP Latest Ref Rng & Units 02/01/2017  Glucose 65 - 99 mg/dL 166(H)  BUN 6 - 20 mg/dL 10  Creatinine 0.44 - 1.00 mg/dL 0.88  Sodium 135 - 145 mmol/L 136  Potassium 3.5 - 5.1 mmol/L 4.0  Chloride 101 - 111 mmol/L 100(L)  CO2 22 - 32 mmol/L 27    Calcium 8.9 - 10.3 mg/dL 8.4(L)  Total Protein 6.5 - 8.1 g/dL 6.0(L)  Total Bilirubin 0.3 - 1.2 mg/dL 0.5  Alkaline Phos 38 - 126 U/L 55  AST 15 - 41 U/L 13(L)  ALT 14 - 54 U/L 9(L)   CBC Latest Ref Rng & Units 02/01/2017  WBC 3.6 - 11.0 K/uL 4.1  Hemoglobin 12.0 - 16.0 g/dL 11.0(L)  Hematocrit 35.0 - 47.0 % 32.9(L)  Platelets 150 - 440 K/uL 259      Assessment and plan- Patient is a 66 y.o. female with locally advanced upper urothelial carcinoma likely stage IV s/p surgery on palliative keytruda  Counts ok to proceed with next cycle 5 of [place] today. tsh pending from today  Abdominal pain- etiology unclear. Continue fentanyl patch and prn oxycodone  Microcytic anemia- iron studies from today pending. S/p 1 dose of feraheme. Will get second dose today  Bradycardia- resolved  Itching possibly from opioids- advised her to take prn zofran for that. Keep skin moisturized. Occasional low dose benadryl if it persists  Urinary incontinence- will check UA with culture today  rtc in 3 weeks with cbc, cmp   Visit Diagnosis 1. Urothelial cancer ([place])   2. Encounter for antineoplastic immunotherapy      Dr. Randa Evens, MD, MPH [facility] Pager- [phone] 02/01/2017 10:09 AM

## 2017-02-01 NOTE — Addendum Note (Signed)
Addended by: Randa Evens C on: 02/01/2017 10:26 AM   Modules accepted: Orders

## 2017-02-02 LAB — URINE CULTURE: Culture: NO GROWTH

## 2017-02-05 ENCOUNTER — Other Ambulatory Visit: Payer: Self-pay | Admitting: *Deleted

## 2017-02-05 MED ORDER — MORPHINE SULFATE ER 15 MG PO TBCR: 15.0000 mg | EXTENDED_RELEASE_TABLET | Freq: Two times a day (BID) | ORAL | 0 refills | Status: DC

## 2017-02-05 MED ORDER — OXYCODONE HCL 10 MG PO TABS: 10.0000 mg | ORAL_TABLET | ORAL | 0 refills | Status: DC | PRN

## 2017-02-05 NOTE — Telephone Encounter (Signed)
This is a Dr Janese Banks patient who wants to pick up prescription in [facility] office., Thank you

## 2017-02-20 ENCOUNTER — Other Ambulatory Visit: Payer: Self-pay | Admitting: *Deleted

## 2017-02-20 MED ORDER — OXYCODONE HCL 10 MG PO TABS: 10.0000 mg | ORAL_TABLET | ORAL | 0 refills | Status: DC | PRN

## 2017-02-20 MED ORDER — MORPHINE SULFATE ER 15 MG PO TBCR: 15.0000 mg | EXTENDED_RELEASE_TABLET | Freq: Two times a day (BID) | ORAL | 0 refills | Status: DC

## 2017-02-20 NOTE — Telephone Encounter (Signed)
Wants to pick up Prescription in [facility]

## 2017-02-22 ENCOUNTER — Inpatient Hospital Stay: Payer: Medicare HMO

## 2017-02-22 ENCOUNTER — Inpatient Hospital Stay (HOSPITAL_BASED_OUTPATIENT_CLINIC_OR_DEPARTMENT_OTHER): Payer: Medicare HMO | Admitting: Oncology

## 2017-02-22 ENCOUNTER — Encounter: Payer: Self-pay | Admitting: Oncology

## 2017-02-22 VITALS — BP 154/90 | HR 82 | Temp 98.2°F | Resp 18 | Wt 169.5 lb

## 2017-02-22 DIAGNOSIS — R109 Unspecified abdominal pain: Secondary | ICD-10-CM

## 2017-02-22 DIAGNOSIS — C689 Malignant neoplasm of urinary organ, unspecified: Secondary | ICD-10-CM

## 2017-02-22 DIAGNOSIS — Z5112 Encounter for antineoplastic immunotherapy: Secondary | ICD-10-CM

## 2017-02-22 DIAGNOSIS — Z79899 Other long term (current) drug therapy: Secondary | ICD-10-CM

## 2017-02-22 DIAGNOSIS — R32 Unspecified urinary incontinence: Secondary | ICD-10-CM | POA: Diagnosis not present

## 2017-02-22 DIAGNOSIS — D509 Iron deficiency anemia, unspecified: Secondary | ICD-10-CM

## 2017-02-22 DIAGNOSIS — Z5111 Encounter for antineoplastic chemotherapy: Secondary | ICD-10-CM | POA: Diagnosis not present

## 2017-02-22 LAB — CBC WITH DIFFERENTIAL/PLATELET
Basophils Absolute: 0 10*3/uL (ref 0–0.1)
Basophils Relative: 1 %
Eosinophils Absolute: 0.2 10*3/uL (ref 0–0.7)
Eosinophils Relative: 3 %
HCT: 35.2 % (ref 35.0–47.0)
HEMOGLOBIN: 11.9 g/dL — AB (ref 12.0–16.0)
LYMPHS ABS: 1 10*3/uL (ref 1.0–3.6)
LYMPHS PCT: 16 %
MCH: 27.8 pg (ref 26.0–34.0)
MCHC: 33.8 g/dL (ref 32.0–36.0)
MCV: 82.2 fL (ref 80.0–100.0)
MONOS PCT: 5 %
Monocytes Absolute: 0.3 10*3/uL (ref 0.2–0.9)
NEUTROS ABS: 4.7 10*3/uL (ref 1.4–6.5)
NEUTROS PCT: 75 %
Platelets: 269 10*3/uL (ref 150–440)
RBC: 4.28 MIL/uL (ref 3.80–5.20)
RDW: 18.9 % — ABNORMAL HIGH (ref 11.5–14.5)
WBC: 6.3 10*3/uL (ref 3.6–11.0)

## 2017-02-22 LAB — COMPREHENSIVE METABOLIC PANEL
ALK PHOS: 67 U/L (ref 38–126)
ALT: 11 U/L — AB (ref 14–54)
ANION GAP: 11 (ref 5–15)
AST: 14 U/L — ABNORMAL LOW (ref 15–41)
Albumin: 3 g/dL — ABNORMAL LOW (ref 3.5–5.0)
BILIRUBIN TOTAL: 0.3 mg/dL (ref 0.3–1.2)
BUN: 13 mg/dL (ref 6–20)
CALCIUM: 8.4 mg/dL — AB (ref 8.9–10.3)
CO2: 23 mmol/L (ref 22–32)
CREATININE: 0.94 mg/dL (ref 0.44–1.00)
Chloride: 100 mmol/L — ABNORMAL LOW (ref 101–111)
GFR calc non Af Amer: >60 mL/min (ref >60)
GLUCOSE: 98 mg/dL (ref 65–99)
Potassium: 4.1 mmol/L (ref 3.5–5.1)
Sodium: 134 mmol/L — ABNORMAL LOW (ref 135–145)
TOTAL PROTEIN: 6.1 g/dL — AB (ref 6.5–8.1)

## 2017-02-22 MED ORDER — SODIUM CHLORIDE 0.9 % IV SOLN
200.0000 mg | Freq: Once | INTRAVENOUS | Status: AC
  Administered 2017-02-22: 200 mg via INTRAVENOUS
  Filled 2017-02-22: qty 8

## 2017-02-22 MED ORDER — SODIUM CHLORIDE 0.9% FLUSH
10.0000 mL | INTRAVENOUS | Status: DC | PRN
  Administered 2017-02-22: 10 mL
  Filled 2017-02-22: qty 10

## 2017-02-22 MED ORDER — HEPARIN SOD (PORK) LOCK FLUSH 100 UNIT/ML IV SOLN
500.0000 [IU] | Freq: Once | INTRAVENOUS | Status: AC | PRN
  Administered 2017-02-22: 500 [IU]

## 2017-02-22 MED ORDER — SODIUM CHLORIDE 0.9 % IV SOLN
Freq: Once | INTRAVENOUS | Status: AC
  Administered 2017-02-22: 11:00:00 via INTRAVENOUS
  Filled 2017-02-22: qty 1000

## 2017-02-22 NOTE — Progress Notes (Signed)
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 Erlene Quan in March 2018. In late February 2018 patient presented with acute onset left-sided flank pain and was found to have a large 6 x 6.6 x 6 1 cm mid to left upper pole renal mass on which was directly fading the left adrenal gland measuring 3.1 x 3.9 cm. There was no obvious renal vein invasion. No intra-abdominal adenopathy was noted and chest x-ray did not reveal any obvious pulmonary disease  2. Patient underwent left radical nephrectomy on 10/02/2016. During the time of surgery the mass appeared incredibly fibrotic, desmoplastic reaction was noted in the medial aspect of the kidney in the hilar area involving the upper part of the hilum and the adrenal gland. There was also concern for renal vein invasion. Distal ureterectomy and lymph node dissection was not performed thinking that this was possibly a renal cell carcinoma.  3. DIAGNOSIS:  A. LEFT KIDNEY; ROBOTIC-ASSISTED LAPAROSCOPIC RADICAL NEPHRECTOMY:  - UROTHELIAL CARCINOMA, HIGH-GRADE.  - LYMPHOVASCULAR AND PERINEURAL INVASION PRESENT.  - THE HILAR MARGIN IS POSITIVE.  - THE URETERAL AND GEROTA'S FASCIA MARGINS ARE NEGATIVE.  - SEE SUMMARY BELOW.   Surgical Pathology Cancer Case Summary   RENAL PELVIS AND URETER:  Procedure: Robotic-assisted laparoscopic radical nephrectomy  Specimen Laterality: Left  Tumor Site: Hilum, upper and lower poles  Histologic Type: Urothelial carcinoma  Histologic  Grade: High grade  Tumor Extension: Tumor invades adjacent organs (adrenal gland) and  through the kidney into the perinephric fat  Margins: Hilar margin positive; ureteral and Gerota's fasciamargins  negative  Lymphovascular Invasion: Present  Regional Lymph Nodes: No lymph nodes submitted or found  Pathologic Stage Classification (pTNM, AJCC 8th Edition): pT4 pNX  TNM Descriptors: Not applicable  4. Discussed patient's case with Dr. Erlene Quan urology. She does not think that repeat surgery for distal ureterecomy and LN dissection can be performed given patients performance status and comorbidities. Patient lives with her sister and has limited mobility around the house. She is still complaining of abdominal pain which is persistent and of unclear etiology  5. Case discussed at tumor board and pathology slides and images reviewed. Repeat ct chest/abdomen does not reveal gross disease post surgery. However, operative findings suggest locally advanced carcinoma with positive margins suggestive of residual disease. LN not assessed although likely involved making this stage IV. Cystoscopy in may 2018 was NED  6. Patient was not deemed to be cisplatin eligible and given her PS plan was to proceed with [place]   Interval history- on and off abdominal pain controlled with pain meds. Bowel movements have been regular  ECOG PS- 2 Pain scale- 0 Opioid associated constipation- no  Review of systems- Review of Systems  Constitutional: Positive for malaise/fatigue. Negative for chills, fever and weight loss.  HENT: Negative for congestion, ear discharge and nosebleeds.   Eyes: Negative for blurred vision.  Respiratory: Negative for cough, hemoptysis, sputum production, shortness of breath and wheezing.   Cardiovascular: Negative for chest pain, palpitations, orthopnea and claudication.  Gastrointestinal: Positive for abdominal pain. Negative  for blood in stool, constipation, diarrhea, heartburn,  melena, nausea and vomiting.  Genitourinary: Negative for dysuria, flank pain, frequency, hematuria and urgency.  Musculoskeletal: Negative for back pain, joint pain and myalgias.  Skin: Negative for rash.  Neurological: Negative for dizziness, tingling, focal weakness, seizures, weakness and headaches.  Endo/Heme/Allergies: Does not bruise/bleed easily.  Psychiatric/Behavioral: Negative for depression and suicidal ideas. The patient does not have insomnia.       Allergies  Allergen Reactions  . Norco [Hydrocodone-Acetaminophen] Other (See Comments)    hallucinations hallucinations  . Codeine Phosphate Other (See Comments)    REACTION: unspecified  . Duloxetine Other (See Comments)    REACTION: unspecified  . Meperidine Hcl Other (See Comments)    REACTION: unspecified  . Venlafaxine Other (See Comments)    REACTION: unspecified  . Doxycycline Hives and Rash     Past Medical History:  Diagnosis Date  . Adenomatous polyp 1999  . Anxiety   . Arthritis    Left knee  . Bacteremia    a. 03/2016 admitted with MRSA/GAS bacteremia following fall and dog bites;  b. 03/2017 TEE: EF 55-60%, triv AI, mild MR, no veg.  Marland Kitchen CAD (coronary artery disease) of artery bypass graft    a. NSTEMI in 07/2015: Cath showed 99% stenosis of distal LCx  --> DES placed;  b. 08/2016 Lexiscan MV: no ischemia, EF 36% (likely 2/2 GI uptake/artifact).  . Carotid arterial disease ([place])    a. 2011 s/p R CEA.  . Depression    Paranoid tendencies  . Diverticulosis   . GERD (gastroesophageal reflux disease)   . Hyperlipidemia   . Hypertension   . Hypothyroidism   . Myocardial infarction ([place]) 2017  . Obesity   . Type II diabetes mellitus ([place])   . Urothelial carcinoma of kidney, left ([place])    a. 08/2016 s/p L nephrectomy-->now on immunoRx.     Past Surgical History:  Procedure Laterality Date  . ABDOMINAL HYSTERECTOMY  1977   One ovary left  . APPENDECTOMY    . BLADDER REPAIR  1977   Tacking  .  CARDIAC CATHETERIZATION N/A 08/01/2015   Procedure: Left Heart Cath and Coronary Angiography;  Surgeon: Wellington Hampshire, MD;  Location: [place] [facility];  Service: Cardiovascular;  Laterality: N/A;  . CARDIAC CATHETERIZATION N/A 08/01/2015   Procedure: Coronary Stent Intervention;  Surgeon: Wellington Hampshire, MD;  Location: [place] [facility];  Service: Cardiovascular;  Laterality: N/A;  . CARDIOVASCULAR STRESS TEST  7/00   negative. No cardiolite  . CAROTID ENDARTERECTOMY  2/11   Dr Jamal Collin  . [place]  . CHOLECYSTECTOMY    . CT ABD W & PELVIS WO CM  2002   Negative. Pituitary normal on MRI  . HEEL SPUR SURGERY  03/03   Left  . INTRAMEDULLARY (IM) NAIL INTERTROCHANTERIC Left 08/04/2015   Procedure: INTRAMEDULLARY (IM) NAIL INTERTROCHANTRIC;  Surgeon: Hessie Knows, MD;  Location: [facility];  Service: Orthopedics;  Laterality: Left;  . IR FLUORO GUIDE PORT INSERTION RIGHT  11/09/2016  . KNEE ARTHROSCOPY  2001   Left   Tamala Julian)  . RENAL MASS EXCISION Left april10/18   per pt left kidney removed -cancerous per pt /family  . ROBOT ASSISTED LAPAROSCOPIC NEPHRECTOMY Left 10/02/2016   Procedure: ROBOTIC ASSISTED LAPAROSCOPIC RADICAL NEPHRECTOMY;  Surgeon: Hollice Espy, MD;  Location: [facility];  Service: Urology;  Laterality: Left;  . TEE WITHOUT CARDIOVERSION N/A 04/11/2016   Procedure: TRANSESOPHAGEAL ECHOCARDIOGRAM (TEE);  Surgeon:  Teodoro Spray, MD;  Location: [facility];  Service: Cardiovascular;  Laterality: N/A;  . TOTAL KNEE ARTHROPLASTY  11/12   left--Dr Tamala Julian  . TRANSTHORACIC ECHOCARDIOGRAM  3/04   normal    Social History   Social History  . Marital status: Widowed    Spouse name: N/A  . Number of children: 3  . Years of education: N/A   Occupational History  . Receptionist---retired     [employer]  .     Marland Kitchen      Social History Main Topics  . Smoking status: Former Smoker    Packs/day: 2.00    Years: 15.00    Types: Cigarettes    Quit date:  09/06/1986  . Smokeless tobacco: Never Used  . Alcohol use No  . Drug use: No  . Sexual activity: No   Other Topics Concern  . Not on file   Social History Narrative   Widowed 07/02.  Kids have moved out and now living with friend to share expenses.      3 caffeine drinks daily     Family History  Problem Relation Age of Onset  . Cancer Mother        Breast  . Heart disease Mother   . Diabetes Mother   . Colon polyps Mother   . Heart disease Father   . Diabetes Father   . Hypertension Sister   . Diabetes Sister   . Depression Daughter   . Cancer Paternal Grandmother        ? throat CA  . Colon cancer Unknown        Unsure if mother had it   . Rheum arthritis Sister   . Bladder Cancer Neg Hx   . Kidney cancer Neg Hx      Current Outpatient Prescriptions:  .  ACCU-CHEK SMARTVIEW test strip, , Disp: , Rfl:  .  aspirin EC 81 MG tablet, Take 1 tablet (81 mg total) by mouth daily., Disp: 90 tablet, Rfl: 3 .  atorvastatin (LIPITOR) 40 MG tablet, Take 40 mg by mouth daily., Disp: , Rfl:  .  citalopram (CELEXA) 20 MG tablet, Take 20 mg by mouth daily., Disp: , Rfl:  .  diphenhydrAMINE (BENADRYL) 25 MG tablet, Take 25 mg by mouth every 6 (six) hours as needed., Disp: , Rfl:  .  docusate sodium (COLACE) 100 MG capsule, Take 1 capsule (100 mg total) by mouth 2 (two) times daily., Disp: 60 capsule, Rfl: 0 .  hydrochlorothiazide (HYDRODIURIL) 25 MG tablet, Take 12.5 mg by mouth daily. , Disp: , Rfl:  .  insulin aspart (NOVOLOG) 100 UNIT/ML FlexPen, Inject into the skin., Disp: , Rfl:  .  insulin glargine (LANTUS) 100 UNIT/ML injection, Inject 24 Units into the skin at bedtime., Disp: , Rfl:  .  levothyroxine (SYNTHROID, LEVOTHROID) 100 MCG tablet, Take 1 tablet (100 mcg total) by mouth daily., Disp: 90 tablet, Rfl: 1 .  lidocaine-prilocaine (EMLA) cream, Apply to affected area once, Disp: 30 g, Rfl: 3 .  lisinopril (PRINIVIL,ZESTRIL) 20 MG tablet, Take 20 mg by mouth daily., Disp: ,  Rfl:  .  LORazepam (ATIVAN) 0.5 MG tablet, Take 1 tablet (0.5 mg total) by mouth every 6 (six) hours as needed (Nausea or vomiting)., Disp: 30 tablet, Rfl: 0 .  morphine (MS CONTIN) 15 MG 12 hr tablet, Take 1 tablet (15 mg total) by mouth every 12 (twelve) hours., Disp: 30 tablet, Rfl: 0 .  Oxycodone HCl 10 MG TABS, Take  1 tablet (10 mg total) by mouth every 4 (four) hours as needed., Disp: 60 tablet, Rfl: 0 .  sennosides-docusate sodium (SENOKOT-S) 8.6-50 MG tablet, Take 2 tablets by mouth daily., Disp: , Rfl:  .  traZODone (DESYREL) 100 MG tablet, Take 100 mg by mouth at bedtime., Disp: , Rfl:  .  ondansetron (ZOFRAN) 4 MG tablet, Take 1 tablet (4 mg total) by mouth every 6 (six) hours as needed for nausea or vomiting. (Patient not taking: Reported on 02/22/2017), Disp: 30 tablet, Rfl: 0 .  ondansetron (ZOFRAN) 8 MG tablet, Take 1 tablet (8 mg total) by mouth 2 (two) times daily as needed (Nausea or vomiting). (Patient not taking: Reported on 02/22/2017), Disp: 30 tablet, Rfl: 2 .  prochlorperazine (COMPAZINE) 10 MG tablet, Take 1 tablet (10 mg total) by mouth every 6 (six) hours as needed (Nausea or vomiting). (Patient not taking: Reported on 02/22/2017), Disp: 30 tablet, Rfl: 1 No current facility-administered medications for this visit.   Facility-Administered Medications Ordered in Other Visits:  .  heparin lock flush 100 unit/mL, 500 Units, Intracatheter, Once PRN, Sindy Guadeloupe, MD .  pembrolizumab [facility]) 200 mg in sodium chloride 0.9 % 50 mL chemo infusion, 200 mg, Intravenous, Once, Sindy Guadeloupe, MD .  sodium chloride flush (NS) 0.9 % injection 10 mL, 10 mL, Intracatheter, PRN, Sindy Guadeloupe, MD, 10 mL at 02/22/17 1000  Physical exam:  Vitals:   02/22/17 0959  BP: (!) 154/90  Pulse: 82  Resp: 18  Temp: 98.2 F (36.8 C)  TempSrc: Tympanic  Weight: 169 lb 8 oz (76.9 kg)   Physical Exam  Constitutional: She is oriented to person, place, and time and well-developed,  well-nourished, and in no distress.  Sitting in a wheelchair  HENT:  Head: Normocephalic and atraumatic.  Eyes: Pupils are equal, round, and reactive to light. EOM are normal.  Neck: Normal range of motion.  Cardiovascular: Normal rate, regular rhythm and normal heart sounds.   Pulmonary/Chest: Effort normal and breath sounds normal.  Abdominal: Soft. Bowel sounds are normal.  Neurological: She is alert and oriented to person, place, and time.  Skin: Skin is warm and dry.     CMP Latest Ref Rng & Units 02/22/2017  Glucose 65 - 99 mg/dL 98  BUN 6 - 20 mg/dL 13  Creatinine 0.44 - 1.00 mg/dL 0.94  Sodium 135 - 145 mmol/L 134(L)  Potassium 3.5 - 5.1 mmol/L 4.1  Chloride 101 - 111 mmol/L 100(L)  CO2 22 - 32 mmol/L 23  Calcium 8.9 - 10.3 mg/dL 8.4(L)  Total Protein 6.5 - 8.1 g/dL 6.1(L)  Total Bilirubin 0.3 - 1.2 mg/dL 0.3  Alkaline Phos 38 - 126 U/L 67  AST 15 - 41 U/L 14(L)  ALT 14 - 54 U/L 11(L)   CBC Latest Ref Rng & Units 02/22/2017  WBC 3.6 - 11.0 K/uL 6.3  Hemoglobin 12.0 - 16.0 g/dL 11.9(L)  Hematocrit 35.0 - 47.0 % 35.2  Platelets 150 - 440 K/uL 269     Assessment and plan- Patient is a 66 y.o. female female with locally advanced upper urothelial carcinoma likely stage IV s/p surgery on palliative keytruda  Counts ok to proceed with next cycle 6 of [place] today. tsh done recently normal. Scans after next cycle  Abdominal pain- etiology unclear. Continue fentanyl patch and prn oxycodone  Iron deficiency anemia- improved with IV iron.     Visit Diagnosis 1. Urothelial cancer ([place])   2. Encounter for antineoplastic immunotherapy  Dr. Randa Evens, MD, MPH [place] [facility] Pager- [phone] 02/22/2017 11:10 AM

## 2017-02-22 NOTE — Progress Notes (Signed)
Here for follow up

## 2017-03-05 ENCOUNTER — Other Ambulatory Visit: Payer: Self-pay | Admitting: *Deleted

## 2017-03-05 DIAGNOSIS — G893 Neoplasm related pain (acute) (chronic): Secondary | ICD-10-CM

## 2017-03-05 DIAGNOSIS — C689 Malignant neoplasm of urinary organ, unspecified: Secondary | ICD-10-CM

## 2017-03-05 DIAGNOSIS — Z5112 Encounter for antineoplastic immunotherapy: Secondary | ICD-10-CM

## 2017-03-05 MED ORDER — OXYCODONE HCL 10 MG PO TABS: 10.0000 mg | ORAL_TABLET | ORAL | 0 refills | Status: DC | PRN

## 2017-03-05 MED ORDER — MORPHINE SULFATE ER 15 MG PO TBCR: 15.0000 mg | EXTENDED_RELEASE_TABLET | Freq: Two times a day (BID) | ORAL | 0 refills | Status: DC

## 2017-03-05 MED ORDER — LORAZEPAM 0.5 MG PO TABS: 0.5000 mg | ORAL_TABLET | Freq: Four times a day (QID) | ORAL | 0 refills | Status: DC | PRN

## 2017-03-05 NOTE — Telephone Encounter (Signed)
Wants to pick up in [facility] office

## 2017-03-11 ENCOUNTER — Telehealth: Payer: Self-pay | Admitting: *Deleted

## 2017-03-11 NOTE — Telephone Encounter (Signed)
Sindy Guadeloupe, MD  Betti Cruz, RN  Cc: Luella Cook, RN        If her pain worsens, she will call us. Otherwise I will see her on Friday as scheduled.   Thanks,  Fuller Mandril notified of this

## 2017-03-11 NOTE — Telephone Encounter (Signed)
Daughter called reporting increased pain and anorexia. States she is sleeping through night now, but awakens in pain and it has gotten worse. Please advise if you want to do something before her appointment on Friday.

## 2017-03-15 ENCOUNTER — Inpatient Hospital Stay: Payer: Medicare HMO

## 2017-03-15 ENCOUNTER — Inpatient Hospital Stay: Payer: Medicare HMO | Admitting: Oncology

## 2017-03-18 ENCOUNTER — Inpatient Hospital Stay: Payer: Medicare HMO | Attending: Oncology

## 2017-03-18 ENCOUNTER — Encounter: Payer: Self-pay | Admitting: Oncology

## 2017-03-18 ENCOUNTER — Inpatient Hospital Stay: Payer: Medicare HMO

## 2017-03-18 ENCOUNTER — Inpatient Hospital Stay (HOSPITAL_BASED_OUTPATIENT_CLINIC_OR_DEPARTMENT_OTHER): Payer: Medicare HMO | Admitting: Oncology

## 2017-03-18 VITALS — BP 97/63 | HR 80 | Temp 98.9°F | Resp 18 | Ht 62.0 in | Wt 163.0 lb

## 2017-03-18 DIAGNOSIS — R32 Unspecified urinary incontinence: Secondary | ICD-10-CM | POA: Insufficient documentation

## 2017-03-18 DIAGNOSIS — I10 Essential (primary) hypertension: Secondary | ICD-10-CM | POA: Insufficient documentation

## 2017-03-18 DIAGNOSIS — F419 Anxiety disorder, unspecified: Secondary | ICD-10-CM | POA: Insufficient documentation

## 2017-03-18 DIAGNOSIS — Z7982 Long term (current) use of aspirin: Secondary | ICD-10-CM | POA: Diagnosis not present

## 2017-03-18 DIAGNOSIS — Z87891 Personal history of nicotine dependence: Secondary | ICD-10-CM | POA: Diagnosis not present

## 2017-03-18 DIAGNOSIS — Z794 Long term (current) use of insulin: Secondary | ICD-10-CM | POA: Insufficient documentation

## 2017-03-18 DIAGNOSIS — K5900 Constipation, unspecified: Secondary | ICD-10-CM | POA: Insufficient documentation

## 2017-03-18 DIAGNOSIS — E785 Hyperlipidemia, unspecified: Secondary | ICD-10-CM | POA: Insufficient documentation

## 2017-03-18 DIAGNOSIS — D509 Iron deficiency anemia, unspecified: Secondary | ICD-10-CM | POA: Diagnosis not present

## 2017-03-18 DIAGNOSIS — E119 Type 2 diabetes mellitus without complications: Secondary | ICD-10-CM | POA: Insufficient documentation

## 2017-03-18 DIAGNOSIS — Z955 Presence of coronary angioplasty implant and graft: Secondary | ICD-10-CM | POA: Diagnosis not present

## 2017-03-18 DIAGNOSIS — R1031 Right lower quadrant pain: Secondary | ICD-10-CM | POA: Insufficient documentation

## 2017-03-18 DIAGNOSIS — Z79899 Other long term (current) drug therapy: Secondary | ICD-10-CM | POA: Diagnosis not present

## 2017-03-18 DIAGNOSIS — M1990 Unspecified osteoarthritis, unspecified site: Secondary | ICD-10-CM | POA: Insufficient documentation

## 2017-03-18 DIAGNOSIS — Z905 Acquired absence of kidney: Secondary | ICD-10-CM | POA: Diagnosis not present

## 2017-03-18 DIAGNOSIS — F329 Major depressive disorder, single episode, unspecified: Secondary | ICD-10-CM | POA: Insufficient documentation

## 2017-03-18 DIAGNOSIS — C689 Malignant neoplasm of urinary organ, unspecified: Secondary | ICD-10-CM

## 2017-03-18 DIAGNOSIS — E039 Hypothyroidism, unspecified: Secondary | ICD-10-CM | POA: Diagnosis not present

## 2017-03-18 DIAGNOSIS — I25810 Atherosclerosis of coronary artery bypass graft(s) without angina pectoris: Secondary | ICD-10-CM | POA: Insufficient documentation

## 2017-03-18 DIAGNOSIS — K219 Gastro-esophageal reflux disease without esophagitis: Secondary | ICD-10-CM | POA: Diagnosis not present

## 2017-03-18 DIAGNOSIS — E669 Obesity, unspecified: Secondary | ICD-10-CM | POA: Diagnosis not present

## 2017-03-18 DIAGNOSIS — I252 Old myocardial infarction: Secondary | ICD-10-CM | POA: Insufficient documentation

## 2017-03-18 LAB — COMPREHENSIVE METABOLIC PANEL
ALT: 9 U/L — AB (ref 14–54)
AST: 14 U/L — AB (ref 15–41)
Albumin: 3.1 g/dL — ABNORMAL LOW (ref 3.5–5.0)
Alkaline Phosphatase: 73 U/L (ref 38–126)
Anion gap: 10 (ref 5–15)
BILIRUBIN TOTAL: 0.4 mg/dL (ref 0.3–1.2)
BUN: 14 mg/dL (ref 6–20)
CALCIUM: 8.5 mg/dL — AB (ref 8.9–10.3)
CO2: 24 mmol/L (ref 22–32)
Chloride: 100 mmol/L — ABNORMAL LOW (ref 101–111)
Creatinine, Ser: 0.99 mg/dL (ref 0.44–1.00)
GFR, EST NON AFRICAN AMERICAN: 57 mL/min — AB (ref >60)
GLUCOSE: 122 mg/dL — AB (ref 65–99)
Potassium: 3.7 mmol/L (ref 3.5–5.1)
Sodium: 134 mmol/L — ABNORMAL LOW (ref 135–145)
TOTAL PROTEIN: 6.8 g/dL (ref 6.5–8.1)

## 2017-03-18 LAB — CBC WITH DIFFERENTIAL/PLATELET
BASOS ABS: 0.1 10*3/uL (ref 0–0.1)
Basophils Relative: 1 %
EOS PCT: 5 %
Eosinophils Absolute: 0.3 10*3/uL (ref 0–0.7)
HEMATOCRIT: 37.7 % (ref 35.0–47.0)
Hemoglobin: 12.5 g/dL (ref 12.0–16.0)
LYMPHS ABS: 0.6 10*3/uL — AB (ref 1.0–3.6)
LYMPHS PCT: 10 %
MCH: 27.8 pg (ref 26.0–34.0)
MCHC: 33.1 g/dL (ref 32.0–36.0)
MCV: 84 fL (ref 80.0–100.0)
MONO ABS: 0.3 10*3/uL (ref 0.2–0.9)
Monocytes Relative: 5 %
NEUTROS ABS: 5.3 10*3/uL (ref 1.4–6.5)
Neutrophils Relative %: 79 %
Platelets: 325 10*3/uL (ref 150–440)
RBC: 4.5 MIL/uL (ref 3.80–5.20)
RDW: 18.9 % — AB (ref 11.5–14.5)
WBC: 6.6 10*3/uL (ref 3.6–11.0)

## 2017-03-18 MED ORDER — HYDROCODONE-ACETAMINOPHEN 10-325 MG PO TABS: 1.0000 | ORAL_TABLET | ORAL | 0 refills | Status: DC | PRN

## 2017-03-18 MED ORDER — SODIUM CHLORIDE 0.9% FLUSH
10.0000 mL | INTRAVENOUS | Status: DC | PRN
  Administered 2017-03-18: 10 mL via INTRAVENOUS
  Filled 2017-03-18: qty 10

## 2017-03-18 MED ORDER — HEPARIN SOD (PORK) LOCK FLUSH 100 UNIT/ML IV SOLN
500.0000 [IU] | Freq: Once | INTRAVENOUS | Status: AC
  Administered 2017-03-18: 500 [IU] via INTRAVENOUS

## 2017-03-18 NOTE — Addendum Note (Signed)
Addended by: Luella Cook on: 03/18/2017 12:04 PM   Modules accepted: Orders

## 2017-03-18 NOTE — Progress Notes (Signed)
Pt hurting worse and taking more pain. Her daughter states that they increase pain med and pain never gets any better pain control. She vomited this am and it had last night food particles in it, constipated all the time- last bm 2 days ago and a very tiny one yest..  This am she took 3 pain pills between long acting and short acting, 2 tylenol and 2 benadryl.

## 2017-03-18 NOTE — Progress Notes (Signed)
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 Erlene Quan in March 2018. In late February 2018 patient presented with acute onset left-sided flank pain and was found to have a large 6 x 6.6 x 6 1 cm mid to left upper pole renal mass on which was directly fading the left adrenal gland measuring 3.1 x 3.9 cm. There was no obvious renal vein invasion. No intra-abdominal adenopathy was noted and chest x-ray did not reveal any obvious pulmonary disease  2. Patient underwent left radical nephrectomy on 10/02/2016. During the time of surgery the mass appeared incredibly fibrotic, desmoplastic reaction was noted in the medial aspect of the kidney in the hilar area involving the upper part of the hilum and the adrenal gland. There was also concern for renal vein invasion. Distal ureterectomy and lymph node dissection was not performed thinking that this was possibly a renal cell carcinoma.  3. DIAGNOSIS:  A. LEFT KIDNEY; ROBOTIC-ASSISTED LAPAROSCOPIC RADICAL NEPHRECTOMY:  - UROTHELIAL CARCINOMA, HIGH-GRADE.  - LYMPHOVASCULAR AND PERINEURAL INVASION PRESENT.  - THE HILAR MARGIN IS POSITIVE.  - THE URETERAL AND GEROTA'S FASCIA MARGINS ARE NEGATIVE.  - SEE SUMMARY BELOW.   Surgical Pathology Cancer Case Summary   RENAL PELVIS AND URETER:  Procedure: Robotic-assisted laparoscopic radical nephrectomy  Specimen Laterality: Left  Tumor Site: [place]um, upper and lower poles  Histologic Type: Urothelial carcinoma  Histologic  Grade: High grade  Tumor Extension: Tumor invades adjacent organs (adrenal gland) and  through the kidney into the perinephric fat  Margins: Hilar margin positive; ureteral and Gerota's fasciamargins  negative  Lymphovascular Invasion: Present  Regional Lymph Nodes: No lymph nodes submitted or found  Pathologic Stage Classification (pTNM, AJCC 8th Edition): pT4 pNX  TNM Descriptors: Not applicable  4. Discussed patient's case with Dr. Erlene Quan urology. She does not think that repeat surgery for distal ureterecomy and LN dissection can be performed given patients performance status and comorbidities. Patient lives with her sister and has limited mobility around the house. She is still complaining of abdominal pain which is persistent and of unclear etiology  5. Case discussed at tumor board and pathology slides and images reviewed. Repeat ct chest/abdomen does not reveal gross disease post surgery. However, operative findings suggest locally advanced carcinoma with positive margins suggestive of residual disease. LN not assessed although likely involved making this stage IV. Cystoscopy in may 2018 was NED  6. Patient was not deemed to be cisplatin eligible and given her PS plan was to proceed with [place]    Interval history- Patient defers history to her daughter who has been he rcaregiver. Daughter tells me that patients abdominal pain worse since 10 days. She has been using 6 doses of oxycodone daily along with tylenol but that is still not keeping her comfortable. Her last good BM was 2 days ago. She is on docusate and senna for constipation. Patient denies any burning urination, blodo in stool or vaginal discharge. No fever. Oxycodone causes itching. Patients daughter states po intake has not been good although she ate a whole steak yesterday and has  also been eating fruits and vegetables   ECOG PS- 2 Pain scale- 7 Opioid associated constipation- yes  Review of systems- Review of  Systems  Constitutional: Positive for malaise/fatigue.  Gastrointestinal: Positive for abdominal pain.  Neurological: Positive for weakness.      Allergies  Allergen Reactions  . Norco [Hydrocodone-Acetaminophen] Other (See Comments)    hallucinations hallucinations  . Codeine Phosphate Other (See Comments)    REACTION: unspecified  . Duloxetine Other (See Comments)    REACTION: unspecified  . Meperidine Hcl Other (See Comments)    REACTION: unspecified  . Venlafaxine Other (See Comments)    REACTION: unspecified  . Doxycycline Hives and Rash     Past Medical History:  Diagnosis Date  . Adenomatous polyp 1999  . Anxiety   . Arthritis    Left knee  . Bacteremia    a. 03/2016 admitted with MRSA/GAS bacteremia following fall and dog bites;  b. 03/2017 TEE: EF 55-60%, triv AI, mild MR, no veg.  Marland Kitchen CAD (coronary artery disease) of artery bypass graft    a. NSTEMI in 07/2015: Cath showed 99% stenosis of distal LCx  --> DES placed;  b. 08/2016 Lexiscan MV: no ischemia, EF 36% (likely 2/2 GI uptake/artifact).  . Carotid arterial disease ([place])    a. 2011 s/p R CEA.  . Depression    Paranoid tendencies  . Diverticulosis   . GERD (gastroesophageal reflux disease)   . Hyperlipidemia   . Hypertension   . Hypothyroidism   . Myocardial infarction ([place]) 2017  . Obesity   . Type II diabetes mellitus ([place])   . Urothelial cancer ([place])   . Urothelial carcinoma of kidney, left ([place])    a. 08/2016 s/p L nephrectomy-->now on immunoRx.     Past Surgical History:  Procedure Laterality Date  . ABDOMINAL HYSTERECTOMY  1977   One ovary left  . APPENDECTOMY    . BLADDER REPAIR  1977   Tacking  . CARDIAC CATHETERIZATION N/A 08/01/2015   Procedure: Left Heart Cath and Coronary Angiography;  Surgeon: Wellington Hampshire, MD;  Location: [place] [facility];  Service: Cardiovascular;  Laterality: N/A;  . CARDIAC CATHETERIZATION N/A 08/01/2015   Procedure: Coronary Stent Intervention;  Surgeon:  Wellington Hampshire, MD;  Location: [place] [facility];  Service: Cardiovascular;  Laterality: N/A;  . CARDIOVASCULAR STRESS TEST  7/00   negative. No cardiolite  . CAROTID ENDARTERECTOMY  2/11   Dr Jamal Collin  . [place]  . CHOLECYSTECTOMY    . CT ABD W & PELVIS WO CM  2002   Negative. Pituitary normal on MRI  . HEEL SPUR SURGERY  03/03   Left  . INTRAMEDULLARY (IM) NAIL INTERTROCHANTERIC Left 08/04/2015   Procedure: INTRAMEDULLARY (IM) NAIL INTERTROCHANTRIC;  Surgeon: Hessie Knows, MD;  Location: [facility];  Service: Orthopedics;  Laterality: Left;  . IR FLUORO GUIDE PORT INSERTION RIGHT  11/09/2016  . KNEE ARTHROSCOPY  2001   Left   Tamala Julian)  . RENAL MASS EXCISION Left april10/18   per pt left kidney removed -cancerous per pt /family  . ROBOT ASSISTED LAPAROSCOPIC NEPHRECTOMY Left 10/02/2016   Procedure: ROBOTIC ASSISTED LAPAROSCOPIC RADICAL NEPHRECTOMY;  Surgeon: Hollice Espy, MD;  Location: [facility];  Service: Urology;  Laterality: Left;  . TEE WITHOUT CARDIOVERSION N/A 04/11/2016   Procedure: TRANSESOPHAGEAL ECHOCARDIOGRAM (TEE);  Surgeon: Teodoro Spray, MD;  Location: [facility];  Service: Cardiovascular;  Laterality: N/A;  . TOTAL KNEE ARTHROPLASTY  11/12  left--Dr Tamala Julian  . TRANSTHORACIC ECHOCARDIOGRAM  3/04   normal    Social History   Social History  . Marital status: Widowed    Spouse name: N/A  . Number of children: 3  . Years of education: N/A   Occupational History  . Receptionist---retired     [employer]  .     [employer]      Social History Main Topics  . Smoking status: Former Smoker    Packs/day: 2.00    Years: 15.00    Types: Cigarettes    Quit date: 09/06/1986  . Smokeless tobacco: Never Used  . Alcohol use No  . Drug use: No  . Sexual activity: No   Other Topics Concern  . Not on file   Social History Narrative   Widowed 07/02.  Kids have moved out and now living with friend to share expenses.      3 caffeine drinks daily      Family History  Problem Relation Age of Onset  . Cancer Mother        Breast  . Heart disease Mother   . Diabetes Mother   . Colon polyps Mother   . Heart disease Father   . Diabetes Father   . Hypertension Sister   . Diabetes Sister   . Depression Daughter   . Cancer Paternal Grandmother        ? throat CA  . Colon cancer Unknown        Unsure if mother had it   . Rheum arthritis Sister   . Bladder Cancer Neg Hx   . Kidney cancer Neg Hx      Current Outpatient Prescriptions:  .  ACCU-CHEK SMARTVIEW test strip, , Disp: , Rfl:  .  aspirin EC 81 MG tablet, Take 1 tablet (81 mg total) by mouth daily., Disp: 90 tablet, Rfl: 3 .  atorvastatin (LIPITOR) 40 MG tablet, Take 40 mg by mouth daily., Disp: , Rfl:  .  citalopram (CELEXA) 20 MG tablet, Take 20 mg by mouth daily., Disp: , Rfl:  .  diphenhydrAMINE (BENADRYL) 25 MG tablet, Take 25 mg by mouth every 6 (six) hours as needed., Disp: , Rfl:  .  docusate sodium (COLACE) 100 MG capsule, Take 1 capsule (100 mg total) by mouth 2 (two) times daily., Disp: 60 capsule, Rfl: 0 .  hydrochlorothiazide (HYDRODIURIL) 25 MG tablet, Take 12.5 mg by mouth daily. , Disp: , Rfl:  .  insulin aspart (NOVOLOG) 100 UNIT/ML FlexPen, Inject into the skin., Disp: , Rfl:  .  insulin glargine (LANTUS) 100 UNIT/ML injection, Inject 24 Units into the skin at bedtime., Disp: , Rfl:  .  levothyroxine (SYNTHROID, LEVOTHROID) 100 MCG tablet, Take 1 tablet (100 mcg total) by mouth daily., Disp: 90 tablet, Rfl: 1 .  lidocaine-prilocaine (EMLA) cream, Apply to affected area once, Disp: 30 g, Rfl: 3 .  lisinopril (PRINIVIL,ZESTRIL) 20 MG tablet, Take 20 mg by mouth daily., Disp: , Rfl:  .  LORazepam (ATIVAN) 0.5 MG tablet, Take 1 tablet (0.5 mg total) by mouth every 6 (six) hours as needed (Nausea or vomiting)., Disp: 30 tablet, Rfl: 0 .  morphine (MS CONTIN) 15 MG 12 hr tablet, Take 1 tablet (15 mg total) by mouth every 12 (twelve) hours., Disp: 30 tablet, Rfl:  0 .  ondansetron (ZOFRAN) 8 MG tablet, Take 1 tablet (8 mg total) by mouth 2 (two) times daily as needed (Nausea or vomiting)., Disp: 30 tablet, Rfl: 2 .  sennosides-docusate sodium (SENOKOT-S) 8.6-50 MG tablet, Take 2 tablets by mouth daily., Disp: , Rfl:  .  traZODone (DESYREL) 100 MG tablet, Take 100 mg by mouth at bedtime., Disp: , Rfl:  .  HYDROcodone-acetaminophen (NORCO) 10-325 MG tablet, Take 1 tablet by mouth every 4 (four) hours as needed., Disp: 180 tablet, Rfl: 0 .  prochlorperazine (COMPAZINE) 10 MG tablet, Take 1 tablet (10 mg total) by mouth every 6 (six) hours as needed (Nausea or vomiting). (Patient not taking: Reported on 02/22/2017), Disp: 30 tablet, Rfl: 1 No current facility-administered medications for this visit.   Facility-Administered Medications Ordered in Other Visits:  .  sodium chloride flush (NS) 0.9 % injection 10 mL, 10 mL, Intravenous, PRN, Sindy Guadeloupe, MD, 10 mL at 03/18/17 0936  Physical exam:  Vitals:   03/18/17 1013  BP: 97/63  Pulse: 80  Resp: 18  Temp: 98.9 F (37.2 C)  TempSrc: Tympanic  Weight: 163 lb (73.9 kg)  Height: 5\' 2"  (1.575 m)   Physical Exam  Constitutional: She is oriented to person, place, and time.  Appears fatigued and in mild distress from abdominal pain  HENT:  Head: Normocephalic and atraumatic.  Eyes: Pupils are equal, round, and reactive to light. EOM are normal.  Neck: Normal range of motion.  Cardiovascular: Normal rate, regular rhythm and normal heart sounds.   Pulmonary/Chest: Effort normal and breath sounds normal.  Abdominal: Soft. Bowel sounds are normal.  TTP in RLQ (unchanged from before). Mild TTP over epigastrium  Neurological: She is alert and oriented to person, place, and time.  Skin: Skin is warm and dry.     CMP Latest Ref Rng & Units 03/18/2017  Glucose 65 - 99 mg/dL 122(H)  BUN 6 - 20 mg/dL 14  Creatinine 0.44 - 1.00 mg/dL 0.99  Sodium 135 - 145 mmol/L 134(L)  Potassium 3.5 - 5.1 mmol/L 3.7   Chloride 101 - 111 mmol/L 100(L)  CO2 22 - 32 mmol/L 24  Calcium 8.9 - 10.3 mg/dL 8.5(L)  Total Protein 6.5 - 8.1 g/dL 6.8  Total Bilirubin 0.3 - 1.2 mg/dL 0.4  Alkaline Phos 38 - 126 U/L 73  AST 15 - 41 U/L 14(L)  ALT 14 - 54 U/L 9(L)   CBC Latest Ref Rng & Units 03/18/2017  WBC 3.6 - 11.0 K/uL 6.6  Hemoglobin 12.0 - 16.0 g/dL 12.5  Hematocrit 35.0 - 47.0 % 37.7  Platelets 150 - 440 K/uL 325     Assessment and plan- Patient is a 66 y.o. female with metastatic urothelial carcinoma on keytruda  1. Abdominal pain- etiology unclear- she has had this pain since her surgery in April 2018 and it waxes and wanes. Last CT abdomen showed no measurable disease or other cause of abdominal pain. She has been on morphine ER and prn oxycodone which was controlling her pain all this while but pain worse since last 10 days.   Per Dr. Erlene Quan (urology)- she was taken to surgery thinking this was renal cell but pathology showed urothelial carcinoma which was locally invading surrounding structures including nerves and blood vessels and margins were not negative. Hence she has been on immunotherapy with [place] although she does not have measurable disease on scans.   No signs and symptoms of UTI. Patients denies any vaginal discharge. I am not sure if this pain is truly due to maliganncy versus some other etiology. She did have evidence of iron deficiency anemia on recent labs and received 2 doses of feraheme. I will  obtain CT abdomen pelvis with contrast asap and refer her to GI to evaluate any other cause of her abdominal pain after CT abdomen. I will not increase her opioids at this time as it can cause more constipation and could potentially worsen her abdominal pain  I will switch her to prn vicodin from oxycodone since she reports it helped her in the past and did not cause itching while oxycodone does. There was some concern for hallucination from vicodin in the past alhtough patient and duaghter nto  sure about it. Continue long acting morphine. Will add neurontin if pain still not controlled.   LFT's cbc are normal today. She does have chronic RLQ pain so I do not suspect acute appendicitis at this point   2. Opioid induced constipation- switch to miralax BID and continue senna. Add prn bisacodyl suppository  3. Urothelial carcinoma- hold [place] today. Repeat ct abdomen.   RTC in 10 days   Visit Diagnosis 1. Urothelial cancer ([place])   2. Right lower quadrant abdominal pain      Dr. Randa Evens, MD, MPH [facility] Pager- [phone] 03/18/2017 11:21 AM

## 2017-03-19 ENCOUNTER — Ambulatory Visit
Admission: RE | Admit: 2017-03-19 | Discharge: 2017-03-19 | Disposition: A | Discharge status: Home / Self Care | Payer: Medicare HMO | Source: Ambulatory Visit | Attending: Oncology | Admitting: Oncology

## 2017-03-19 DIAGNOSIS — M4856XA Collapsed vertebra, not elsewhere classified, lumbar region, initial encounter for fracture: Secondary | ICD-10-CM | POA: Diagnosis not present

## 2017-03-19 DIAGNOSIS — I700 Atherosclerosis of aorta: Secondary | ICD-10-CM | POA: Insufficient documentation

## 2017-03-19 DIAGNOSIS — Z905 Acquired absence of kidney: Secondary | ICD-10-CM | POA: Insufficient documentation

## 2017-03-19 DIAGNOSIS — I2510 Atherosclerotic heart disease of native coronary artery without angina pectoris: Secondary | ICD-10-CM | POA: Diagnosis not present

## 2017-03-19 DIAGNOSIS — K915 Postcholecystectomy syndrome: Secondary | ICD-10-CM | POA: Diagnosis not present

## 2017-03-19 DIAGNOSIS — R1031 Right lower quadrant pain: Secondary | ICD-10-CM | POA: Diagnosis not present

## 2017-03-19 DIAGNOSIS — K439 Ventral hernia without obstruction or gangrene: Secondary | ICD-10-CM | POA: Diagnosis not present

## 2017-03-19 DIAGNOSIS — I708 Atherosclerosis of other arteries: Secondary | ICD-10-CM | POA: Insufficient documentation

## 2017-03-19 DIAGNOSIS — Z9049 Acquired absence of other specified parts of digestive tract: Secondary | ICD-10-CM | POA: Diagnosis not present

## 2017-03-19 MED ORDER — IOPAMIDOL (ISOVUE-300) INJECTION 61%
100.0000 mL | Freq: Once | INTRAVENOUS | Status: AC | PRN
  Administered 2017-03-19: 100 mL via INTRAVENOUS

## 2017-03-20 ENCOUNTER — Ambulatory Visit (INDEPENDENT_AMBULATORY_CARE_PROVIDER_SITE_OTHER): Payer: Medicare HMO | Admitting: Gastroenterology

## 2017-03-20 VITALS — BP 119/54 | HR 63 | Temp 98.6°F | Ht 62.0 in | Wt 162.0 lb

## 2017-03-20 DIAGNOSIS — K581 Irritable bowel syndrome with constipation: Secondary | ICD-10-CM

## 2017-03-20 NOTE — Progress Notes (Signed)
Jonathon Bellows MD, MRCP(U.K) [address]  [place], [place] [address]  Main: [phone]  Fax: [phone]   Gastroenterology Consultation  Referring Provider: Dr Janese Banks  Primary Care Physician:  Elaina Pattee, MD Primary Gastroenterologist:  Dr. Jonathon Bellows  Reason for Consultation:     Abdominal pain          HPI:   Anita Small is a 66 y.o. y/o female referred for consultation & management  by Dr. Andree Elk, Melba Coon, MD.    She has been referred for abdominal pain by Dr Janese Banks. Stage 4 urothelial cancer.She underwent left radical nephrectomy on 10/02/2016 .She has also undergone Appendectomy ,recently being given IV iron for iron deficiency anemia. Also has opiod induced constipation.   Ct scan of the abdomen on 03/19/17 - Soft tissue prominence at the origins of the SMA and celiac arteries extending along aorta ?early retroperitoneal fibrosis vs neoplasm. Borderline intra and extra hepatic biliary dilation .L1 slight retropulsion. Hb 12.5 03/18/17 . CMP 02/22/17 . She has had immunotherapy.   Abdominal pain: Onset: 1 year , gradually getting worse , sleeps all night but during the day has constant pain .  Site :RLQ Radiation: localized  Severity :7/10  Nature of pain: constant ache, sharp stabbing at times  Aggravating factors: movement  Relieving factors :nothing except pain pills  Weight loss: yes  NSAID use: no  PPI use :no  Gall bladder surgery: taken out  Frequency of bowel movements: once in 3 days -incomplete- been constipated for a month , pain too has got worse in the month . Hard like " M & M's "  Change in bowel movements: been constipated last few months, no change in her opiods.  Relief with bowel movements: yes when she has a good bowel movement  Gas/Bloating/Abdominal distension: does not pass much gas-feels better.   She has had a colonoscopy - some years back .     Past Medical History:  Diagnosis Date  . Adenomatous polyp 1999   . Anxiety   . Arthritis    Left knee  . Bacteremia    a. 03/2016 admitted with MRSA/GAS bacteremia following fall and dog bites;  b. 03/2017 TEE: EF 55-60%, triv AI, mild MR, no veg.  Marland Kitchen CAD (coronary artery disease) of artery bypass graft    a. NSTEMI in 07/2015: Cath showed 99% stenosis of distal LCx  --> DES placed;  b. 08/2016 Lexiscan MV: no ischemia, EF 36% (likely 2/2 GI uptake/artifact).  . Carotid arterial disease ([place])    a. 2011 s/p R CEA.  . Depression    Paranoid tendencies  . Diverticulosis   . GERD (gastroesophageal reflux disease)   . Hyperlipidemia   . Hypertension   . Hypothyroidism   . Myocardial infarction ([place]) 2017  . Obesity   . Type II diabetes mellitus ([place])   . Urothelial cancer ([place])   . Urothelial carcinoma of kidney, left ([place])    a. 08/2016 s/p L nephrectomy-->now on immunoRx.    Past Surgical History:  Procedure Laterality Date  . ABDOMINAL HYSTERECTOMY  1977   One ovary left  . APPENDECTOMY    . BLADDER REPAIR  1977   Tacking  . CARDIAC CATHETERIZATION N/A 08/01/2015   Procedure: Left Heart Cath and Coronary Angiography;  Surgeon: Wellington Hampshire, MD;  Location: [facility];  Service: Cardiovascular;  Laterality: N/A;  . CARDIAC CATHETERIZATION N/A 08/01/2015   Procedure: Coronary Stent Intervention;  Surgeon:  Wellington Hampshire, MD;  Location: [facility];  Service: Cardiovascular;  Laterality: N/A;  . CARDIOVASCULAR STRESS TEST  7/00   negative. No cardiolite  . CAROTID ENDARTERECTOMY  2/11   Dr Jamal Collin  . [place]  . CHOLECYSTECTOMY    . CT ABD W & PELVIS WO CM  2002   Negative. Pituitary normal on MRI  . HEEL SPUR SURGERY  03/03   Left  . INTRAMEDULLARY (IM) NAIL INTERTROCHANTERIC Left 08/04/2015   Procedure: INTRAMEDULLARY (IM) NAIL INTERTROCHANTRIC;  Surgeon: Hessie Knows, MD;  Location: [facility];  Service: Orthopedics;  Laterality: Left;  . IR FLUORO GUIDE PORT INSERTION RIGHT  11/09/2016  . KNEE  ARTHROSCOPY  2001   Left   Tamala Julian)  . RENAL MASS EXCISION Left april10/18   per pt left kidney removed -cancerous per pt /family  . ROBOT ASSISTED LAPAROSCOPIC NEPHRECTOMY Left 10/02/2016   Procedure: ROBOTIC ASSISTED LAPAROSCOPIC RADICAL NEPHRECTOMY;  Surgeon: Hollice Espy, MD;  Location: [facility];  Service: Urology;  Laterality: Left;  . TEE WITHOUT CARDIOVERSION N/A 04/11/2016   Procedure: TRANSESOPHAGEAL ECHOCARDIOGRAM (TEE);  Surgeon: Teodoro Spray, MD;  Location: [facility];  Service: Cardiovascular;  Laterality: N/A;  . TOTAL KNEE ARTHROPLASTY  11/12   left--Dr Tamala Julian  . TRANSTHORACIC ECHOCARDIOGRAM  3/04   normal    Prior to Admission medications   Medication Sig Start Date End Date Taking? Authorizing Provider  ACCU-CHEK SMARTVIEW test strip  10/23/16   [provider]  aspirin EC 81 MG tablet Take 1 tablet (81 mg total) by mouth daily. 09/05/16   Dunn, Areta Haber, PA-C  atorvastatin (LIPITOR) 40 MG tablet Take 40 mg by mouth daily.    [provider]  citalopram (CELEXA) 20 MG tablet Take 20 mg by mouth daily.    [provider]  diphenhydrAMINE (BENADRYL) 25 MG tablet Take 25 mg by mouth every 6 (six) hours as needed.    [provider]  docusate sodium (COLACE) 100 MG capsule Take 1 capsule (100 mg total) by mouth 2 (two) times daily. 10/05/16   Hollice Espy, MD  hydrochlorothiazide (HYDRODIURIL) 25 MG tablet Take 12.5 mg by mouth daily.     [provider]  HYDROcodone-acetaminophen (NORCO) 10-325 MG tablet Take 1 tablet by mouth every 4 (four) hours as needed. 03/18/17   Sindy Guadeloupe, MD  insulin aspart (NOVOLOG) 100 UNIT/ML FlexPen Inject into the skin. 10/23/16 10/23/17  [provider]  insulin glargine (LANTUS) 100 UNIT/ML injection Inject 24 Units into the skin at bedtime.    [provider]  levothyroxine (SYNTHROID, LEVOTHROID) 100 MCG tablet Take 1 tablet (100 mcg total) by mouth daily. 02/25/14   Venia Carbon,  MD  lidocaine-prilocaine (EMLA) cream Apply to affected area once 11/02/16   Sindy Guadeloupe, MD  lisinopril (PRINIVIL,ZESTRIL) 20 MG tablet Take 20 mg by mouth daily.    [provider]  LORazepam (ATIVAN) 0.5 MG tablet Take 1 tablet (0.5 mg total) by mouth every 6 (six) hours as needed (Nausea or vomiting). 03/05/17   Cammie Sickle, MD  morphine (MS CONTIN) 15 MG 12 hr tablet Take 1 tablet (15 mg total) by mouth every 12 (twelve) hours. 03/05/17   Cammie Sickle, MD  ondansetron (ZOFRAN) 8 MG tablet Take 1 tablet (8 mg total) by mouth 2 (two) times daily as needed (Nausea or vomiting). 02/01/17   Sindy Guadeloupe, MD  Oxycodone HCl 10 MG TABS Take 10  mg by mouth every 4 (four) hours as needed. 03/06/17   [provider]  prochlorperazine (COMPAZINE) 10 MG tablet Take 1 tablet (10 mg total) by mouth every 6 (six) hours as needed (Nausea or vomiting). Patient not taking: Reported on 02/22/2017 01/01/17   Sindy Guadeloupe, MD  senna (SENOKOT) 8.6 MG tablet Take by mouth.    [provider]  sennosides-docusate sodium (SENOKOT-S) 8.6-50 MG tablet Take 2 tablets by mouth daily.    [provider]  traZODone (DESYREL) 100 MG tablet Take 100 mg by mouth at bedtime.    [provider]    Family History  Problem Relation Age of Onset  . Cancer Mother        Breast  . Heart disease Mother   . Diabetes Mother   . Colon polyps Mother   . Heart disease Father   . Diabetes Father   . Hypertension Sister   . Diabetes Sister   . Depression Daughter   . Cancer Paternal Grandmother        ? throat CA  . Colon cancer Unknown        Unsure if mother had it   . Rheum arthritis Sister   . Bladder Cancer Neg Hx   . Kidney cancer Neg Hx      Social History  Substance Use Topics  . Smoking status: Former Smoker    Packs/day: 2.00    Years: 15.00    Types: Cigarettes    Quit date: 09/06/1986  . Smokeless tobacco: Never Used  . Alcohol use No     Allergies as of 03/20/2017 - Review Complete 03/19/2017  Allergen Reaction Noted  . Norco [hydrocodone-acetaminophen] Other (See Comments) 08/02/2015  . Codeine phosphate Other (See Comments) 08/22/2006  . Duloxetine Other (See Comments) 08/22/2006  . Meperidine hcl Other (See Comments) 08/22/2006  . Venlafaxine Other (See Comments) 08/22/2006  . Doxycycline Hives and Rash 02/22/2012    Review of Systems:    All systems reviewed and negative except where noted in HPI.   Physical Exam:  There were no vitals taken for this visit. No LMP recorded. Patient has had a hysterectomy. Psych:  Alert and cooperative. Normal mood and affect. General:   Alert,  Well-developed, sitting in wheel chair  Head:  Normocephalic and atraumatic. Eyes:  Sclera clear, no icterus.   Conjunctiva pink. Ears:  Normal auditory acuity. Nose:  No deformity, discharge, or lesions. Mouth:  No deformity or lesions,oropharynx pink & moist. Neck:  Supple; no masses or thyromegaly. Lungs:  Respirations even and unlabored.  Clear throughout to auscultation.   No wheezes, crackles, or rhonchi. No acute distress. Heart:  Regular rate and rhythm; no murmurs, clicks, rubs, or gallops. Abdomen:  Normal bowel sounds.  No bruits.  Soft, non-tender and non-distended without masses, hepatosplenomegaly or hernias noted.  No guarding or rebound tenderness.    Neurologic:  Alert and oriented x3;  grossly normal neurologically. Skin:  Intact without significant lesions or rashes. No jaundice. Lymph Nodes:  No significant cervical adenopathy. Psych:  Alert and cooperative. Normal mood and affect.  Imaging Studies: Ct Abdomen Pelvis W Contrast  Result Date: 03/19/2017 CLINICAL DATA:  Chronic pain, primarily right lower quadrant. History of urothelial carcinoma left kidney EXAM: CT ABDOMEN AND PELVIS WITH CONTRAST TECHNIQUE: Multidetector CT imaging of the abdomen and pelvis was performed using the standard protocol following  bolus administration of intravenous contrast. Oral contrast was also administered. CONTRAST:  12mL ISOVUE-300 IOPAMIDOL (ISOVUE-300) INJECTION  61% COMPARISON:  December 05, 2016 FINDINGS: Lower chest: Lung bases are clear. There is extensive calcification in the mitral annulus as well as foci of coronary artery calcification evident. A small amount of oral contrast is noted in the distal esophagus. Hepatobiliary: No focal liver lesions are appreciable. Gallbladder is absent. There is slight intrahepatic duct dilatation. Common bile duct measures just over 10 mm, mildly dilated for postcholecystectomy state. No biliary duct mass or calculus evident. Pancreas: No pancreatic mass or inflammatory focus is appreciable. Spleen: There is no appreciable splenic lesion. Adrenals/Urinary Tract: Patient is status post left adrenalectomy and nephrectomy. There is no mass in the left renal fossa region. Right adrenal appears normal. There is no mass or hydronephrosis involving right kidney. There is no renal or ureteral calculus on the right. Urinary bladder is midline with wall thickness within normal limits for degree of distention. Stomach/Bowel: There is diffuse stool throughout the colon. There are scattered colonic diverticula without diverticulitis. There is no appreciable bowel wall or mesenteric thickening. No evident bowel obstruction. No free air or portal venous air. Vascular/Lymphatic: There is aortoiliac atherosclerosis. No evident aneurysm. There is extensive calcification in the proximal mesenteric arteries. There is again noted soft tissue attenuation material surrounding the proximal celiac and superior mesenteric artery is, abutting the anterior aorta superior to the right renal artery. This finding was present on most recent CT but appears slightly more extensive. This area of soft tissue opacity measures 3.0 x 2.4 x 2.3 cm. There is no well-defined adenopathy in the abdomen or pelvis. Reproductive: Uterus is  absent.  No evident pelvic mass. Other: Appendix absent. No abscess or ascites evident in the abdomen or pelvis. There is a small ventral hernia containing only fat. Musculoskeletal: There is anterior wedging of the L1 vertebral body. There is slight retropulsion of bone at T12-L1 causing mild impression on the thecal sac but no high-grade stenosis. There are no blastic or lytic bone lesions. There is postoperative change in the proximal left femur. Areas of scarring are noted in the lower abdominal wall. No intramuscular lesion is evident. IMPRESSION: 1. Status post left nephrectomy and left adrenalectomy without mass seen in the left pararenal fossa region. 2. Soft tissue prominence is noted at the origins of the superior mesenteric and celiac arteries extending along the anterior aspect of the aorta. This area is slightly more prominent than on recent CT, currently measuring 3.02.4 x 2.3 cm. 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 A neoplastic etiology for this soft tissue opacity is a differential consideration, however. Nuclear medicine PET study in this regard may be helpful to further assess. 3. Extensive aortoiliac atherosclerosis. No aneurysm. There is extensive calcification in the visualized proximal mesenteric vessels. Foci of coronary artery calcification also noted. 4. Gallbladder absent. Borderline intrahepatic and extrahepatic biliary duct dilatation without mass or calculus evident in the biliary ductal system. 5.  Uterus and appendix absent. 6. Anterior wedging of the L1 vertebral body with slight retropulsion of bone along the superior aspect of L1 causing mild narrowing of the thecal sac, stable. Multilevel arthropathy noted in the lumbar spine. No blastic or lytic bone lesions evident. 7.  Small ventral hernia containing only fat. Aortic Atherosclerosis (ICD10-I70.0). These results will be called to the ordering clinician or representative by the Radiologist Assistant, and  communication documented in the PACS or zVision Dashboard. Electronically Signed   By: Lowella Grip III M.D.   On: 03/19/2017 10:21    Assessment and Plan:   Anita Small  Anita Small is a 66 y.o. y/o female has been referred for RLQ pain. Her history and symptoms are very suggestive of IBS-C or opiod induced constipation . She also has some biliary dilation which is probably post cholecystectomy related as she has normal LFT's. Did briefly discuss results of her CT scan from yesterday which she will follow up with Dr Janese Banks .  Plan   1. Magnesium citrate today along with a suppository to clear her colon of existing stool. After that she will commence on Linzess 290 mcg once a day . I have given samples for 2 weeks. If she does well can call my office for a discount card. If not better will try Trulance and last option opiod antagonist . She has been instructed to call my office in 10-14 days to inform how she is doing. At this point dont see a reason to do a colonoscopy with all the other issues she is facing, if her symptoms do not improve , I can consider it.      Follow up PRN  Dr Jonathon Bellows MD,MRCP(U.K)

## 2017-03-25 ENCOUNTER — Other Ambulatory Visit: Payer: Self-pay | Admitting: *Deleted

## 2017-03-25 MED ORDER — MORPHINE SULFATE ER 15 MG PO TBCR: 15.0000 mg | EXTENDED_RELEASE_TABLET | Freq: Two times a day (BID) | ORAL | 0 refills | Status: DC

## 2017-03-25 NOTE — Addendum Note (Signed)
Addended by: Betti Cruz on: 03/25/2017 04:11 PM   Modules accepted: Orders

## 2017-03-28 ENCOUNTER — Inpatient Hospital Stay: Payer: Medicare HMO

## 2017-03-28 ENCOUNTER — Encounter: Payer: Self-pay | Admitting: Oncology

## 2017-03-28 ENCOUNTER — Inpatient Hospital Stay: Payer: Medicare HMO | Attending: Oncology | Admitting: Oncology

## 2017-03-28 VITALS — BP 107/54 | HR 72 | Temp 98.8°F | Resp 14 | Wt 162.0 lb

## 2017-03-28 DIAGNOSIS — Z79899 Other long term (current) drug therapy: Secondary | ICD-10-CM

## 2017-03-28 DIAGNOSIS — E860 Dehydration: Secondary | ICD-10-CM | POA: Diagnosis not present

## 2017-03-28 DIAGNOSIS — E669 Obesity, unspecified: Secondary | ICD-10-CM | POA: Diagnosis not present

## 2017-03-28 DIAGNOSIS — R109 Unspecified abdominal pain: Secondary | ICD-10-CM | POA: Diagnosis not present

## 2017-03-28 DIAGNOSIS — Z7982 Long term (current) use of aspirin: Secondary | ICD-10-CM | POA: Diagnosis not present

## 2017-03-28 DIAGNOSIS — Z23 Encounter for immunization: Secondary | ICD-10-CM | POA: Diagnosis not present

## 2017-03-28 DIAGNOSIS — Z955 Presence of coronary angioplasty implant and graft: Secondary | ICD-10-CM | POA: Diagnosis not present

## 2017-03-28 DIAGNOSIS — I10 Essential (primary) hypertension: Secondary | ICD-10-CM | POA: Diagnosis not present

## 2017-03-28 DIAGNOSIS — Z5111 Encounter for antineoplastic chemotherapy: Secondary | ICD-10-CM | POA: Diagnosis present

## 2017-03-28 DIAGNOSIS — I252 Old myocardial infarction: Secondary | ICD-10-CM | POA: Diagnosis not present

## 2017-03-28 DIAGNOSIS — E119 Type 2 diabetes mellitus without complications: Secondary | ICD-10-CM | POA: Diagnosis not present

## 2017-03-28 DIAGNOSIS — C649 Malignant neoplasm of unspecified kidney, except renal pelvis: Secondary | ICD-10-CM | POA: Diagnosis not present

## 2017-03-28 DIAGNOSIS — Z5112 Encounter for antineoplastic immunotherapy: Secondary | ICD-10-CM

## 2017-03-28 DIAGNOSIS — Z905 Acquired absence of kidney: Secondary | ICD-10-CM | POA: Diagnosis not present

## 2017-03-28 DIAGNOSIS — I25810 Atherosclerosis of coronary artery bypass graft(s) without angina pectoris: Secondary | ICD-10-CM | POA: Diagnosis not present

## 2017-03-28 DIAGNOSIS — C689 Malignant neoplasm of urinary organ, unspecified: Secondary | ICD-10-CM

## 2017-03-28 DIAGNOSIS — Z87891 Personal history of nicotine dependence: Secondary | ICD-10-CM | POA: Diagnosis not present

## 2017-03-28 DIAGNOSIS — M1990 Unspecified osteoarthritis, unspecified site: Secondary | ICD-10-CM | POA: Diagnosis not present

## 2017-03-28 DIAGNOSIS — F419 Anxiety disorder, unspecified: Secondary | ICD-10-CM | POA: Diagnosis not present

## 2017-03-28 DIAGNOSIS — K5900 Constipation, unspecified: Secondary | ICD-10-CM | POA: Diagnosis not present

## 2017-03-28 DIAGNOSIS — Z794 Long term (current) use of insulin: Secondary | ICD-10-CM | POA: Diagnosis not present

## 2017-03-28 DIAGNOSIS — E039 Hypothyroidism, unspecified: Secondary | ICD-10-CM | POA: Diagnosis not present

## 2017-03-28 DIAGNOSIS — F329 Major depressive disorder, single episode, unspecified: Secondary | ICD-10-CM | POA: Diagnosis not present

## 2017-03-28 DIAGNOSIS — K219 Gastro-esophageal reflux disease without esophagitis: Secondary | ICD-10-CM | POA: Diagnosis not present

## 2017-03-28 DIAGNOSIS — E785 Hyperlipidemia, unspecified: Secondary | ICD-10-CM | POA: Diagnosis not present

## 2017-03-28 DIAGNOSIS — E871 Hypo-osmolality and hyponatremia: Secondary | ICD-10-CM | POA: Diagnosis not present

## 2017-03-28 LAB — COMPREHENSIVE METABOLIC PANEL
ALT: 12 U/L — ABNORMAL LOW (ref 14–54)
ANION GAP: 8 (ref 5–15)
AST: 19 U/L (ref 15–41)
Albumin: 2.9 g/dL — ABNORMAL LOW (ref 3.5–5.0)
Alkaline Phosphatase: 73 U/L (ref 38–126)
BUN: 17 mg/dL (ref 6–20)
CALCIUM: 8.4 mg/dL — AB (ref 8.9–10.3)
CHLORIDE: 103 mmol/L (ref 101–111)
CO2: 25 mmol/L (ref 22–32)
CREATININE: 1.06 mg/dL — AB (ref 0.44–1.00)
GFR, EST NON AFRICAN AMERICAN: 53 mL/min — AB (ref >60)
Glucose, Bld: 131 mg/dL — ABNORMAL HIGH (ref 65–99)
Potassium: 4 mmol/L (ref 3.5–5.1)
Sodium: 136 mmol/L (ref 135–145)
Total Bilirubin: 0.4 mg/dL (ref 0.3–1.2)
Total Protein: 6.4 g/dL — ABNORMAL LOW (ref 6.5–8.1)

## 2017-03-28 LAB — CBC WITH DIFFERENTIAL/PLATELET
Basophils Absolute: 0 10*3/uL (ref 0–0.1)
Basophils Relative: 0 %
EOS PCT: 3 %
Eosinophils Absolute: 0.3 10*3/uL (ref 0–0.7)
HCT: 35.1 % (ref 35.0–47.0)
Hemoglobin: 12 g/dL (ref 12.0–16.0)
LYMPHS ABS: 1.4 10*3/uL (ref 1.0–3.6)
LYMPHS PCT: 14 %
MCH: 28.7 pg (ref 26.0–34.0)
MCHC: 34.3 g/dL (ref 32.0–36.0)
MCV: 83.8 fL (ref 80.0–100.0)
MONO ABS: 0.4 10*3/uL (ref 0.2–0.9)
MONOS PCT: 4 %
Neutro Abs: 7.4 10*3/uL — ABNORMAL HIGH (ref 1.4–6.5)
Neutrophils Relative %: 79 %
PLATELETS: 346 10*3/uL (ref 150–440)
RBC: 4.19 MIL/uL (ref 3.80–5.20)
RDW: 18.7 % — AB (ref 11.5–14.5)
WBC: 9.5 10*3/uL (ref 3.6–11.0)

## 2017-03-28 MED ORDER — SODIUM CHLORIDE 0.9 % IV SOLN
200.0000 mg | Freq: Once | INTRAVENOUS | Status: AC
  Administered 2017-03-28: 200 mg via INTRAVENOUS
  Filled 2017-03-28: qty 8

## 2017-03-28 MED ORDER — SODIUM CHLORIDE 0.9 % IV SOLN
Freq: Once | INTRAVENOUS | Status: AC
  Administered 2017-03-28: 10:00:00 via INTRAVENOUS
  Filled 2017-03-28: qty 1000

## 2017-03-28 MED ORDER — SODIUM CHLORIDE 0.9% FLUSH
10.0000 mL | INTRAVENOUS | Status: DC | PRN
  Filled 2017-03-28: qty 10

## 2017-03-28 MED ORDER — GABAPENTIN 300 MG PO CAPS: ORAL_CAPSULE | ORAL | 0 refills | Status: DC

## 2017-03-28 MED ORDER — INFLUENZA VAC SPLIT QUAD 0.5 ML IM SUSY
0.5000 mL | PREFILLED_SYRINGE | Freq: Once | INTRAMUSCULAR | Status: AC
  Administered 2017-03-28: 0.5 mL via INTRAMUSCULAR
  Filled 2017-03-28: qty 0.5

## 2017-03-28 MED ORDER — HEPARIN SOD (PORK) LOCK FLUSH 100 UNIT/ML IV SOLN
500.0000 [IU] | Freq: Once | INTRAVENOUS | Status: AC
  Administered 2017-03-28: 500 [IU] via INTRAVENOUS
  Filled 2017-03-28: qty 5

## 2017-03-28 MED ORDER — HEPARIN SOD (PORK) LOCK FLUSH 100 UNIT/ML IV SOLN: 500.0000 [IU] | Freq: Once | INTRAVENOUS | Status: AC

## 2017-03-28 MED ORDER — SODIUM CHLORIDE 0.9% FLUSH
10.0000 mL | Freq: Once | INTRAVENOUS | Status: AC
  Administered 2017-03-28: 10 mL via INTRAVENOUS
  Filled 2017-03-28: qty 10

## 2017-03-28 NOTE — Progress Notes (Signed)
Patient receiving Keytruda today per Dr. Rao 

## 2017-03-28 NOTE — Progress Notes (Signed)
Patient here for follow up with CT Scan results and keytruda treatment today. She continues to have lower abdominal pain and itching all over. She requests to get the flu vaccine today.

## 2017-03-28 NOTE — Progress Notes (Signed)
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 Erlene Quan in March 2018. In late February 2018 patient presented with acute onset left-sided flank pain and was found to have a large 6 x 6.6 x 6 1 cm mid to left upper pole renal mass on which was directly fading the left adrenal gland measuring 3.1 x 3.9 cm. There was no obvious renal vein invasion. No intra-abdominal adenopathy was noted and chest x-ray did not reveal any obvious pulmonary disease  2. Patient underwent left radical nephrectomy on 10/02/2016. During the time of surgery the mass appeared incredibly fibrotic, desmoplastic reaction was noted in the medial aspect of the kidney in the hilar area involving the upper part of the hilum and the adrenal gland. There was also concern for renal vein invasion. Distal ureterectomy and lymph node dissection was not performed thinking that this was possibly a renal cell carcinoma.  3. DIAGNOSIS:  A. LEFT KIDNEY; ROBOTIC-ASSISTED LAPAROSCOPIC RADICAL NEPHRECTOMY:  - UROTHELIAL CARCINOMA, HIGH-GRADE.  - LYMPHOVASCULAR AND PERINEURAL INVASION PRESENT.  - THE HILAR MARGIN IS POSITIVE.  - THE URETERAL AND GEROTA'S FASCIA MARGINS ARE NEGATIVE.  - SEE SUMMARY BELOW.   Surgical Pathology Cancer Case Summary   RENAL PELVIS AND URETER:  Procedure: Robotic-assisted laparoscopic radical nephrectomy  Specimen Laterality: Left  Tumor Site: Hilum, upper and lower poles  Histologic Type: Urothelial carcinoma  Histologic  Grade: High grade  Tumor Extension: Tumor invades adjacent organs (adrenal gland) and  through the kidney into the perinephric fat  Margins: Hilar margin positive; ureteral and Gerota's fasciamargins  negative  Lymphovascular Invasion: Present  Regional Lymph Nodes: No lymph nodes submitted or found  Pathologic Stage Classification (pTNM, AJCC 8th Edition): pT4 pNX  TNM Descriptors: Not applicable  4. Discussed patient's case with Dr. Erlene Quan urology. She did not think that repeat surgery for distal ureterecomy and LN dissection could be performed given patients performance status and comorbidities. Patient lives with her sister and has limited mobility around the house. She is still complaining of abdominal pain which is persistent and of unclear etiology  5. Case discussed at tumor board and pathology slides and images reviewed. Repeat ct chest/abdomen does not reveal gross disease post surgery. However, operative findings suggest locally advanced carcinoma with positive margins suggestive of residual disease. LN not assessed although likely involved, making this stage IV. Cystoscopy in may 2018 was NED  6. Patient was not deemed to be cisplatin eligible and given her PS, plan was to proceed with [place]. Keytruda initiated on 11/09/2016.   7. Patient did have some iron deficiency anemia and was given 2 doses of feraheme 01/11/17 and 02/01/17.   Interval history- patient is a poor historian and history is provided by her daughter who is her primary caregiver. Patient saw GI 1 week ago and was given magnesium citrate and daily Linzess. Patient's daughter reports at least 1-2 bowel movements in the past week but is unsure of frequency, characteristic, or quality. She is to follow up with GI by phone in 1 week. Patient's daughter feels that  abdominal pain has not improved with improvement in constipation symptoms. Pain continues to be localized to right lower quadrant, it does not radiate,  rated 7 of 10 in severity, and described I with sudden sharp stabbing pain at times. It is aggravated by movement and wakes her up at night. Pain medication helps with the pain. She has suffered weight loss and continues to have a poor appetite. She has not seen the dietitian but would consider doing so. She continues to have significant itching despite switching pain medication. Currently she takes Benadryl at night but continue and her sleep.   ECOG PS- 2 Pain scale- 7 Opioid associated constipation- yes  Review of systems- Review of Systems  Constitutional: Positive for malaise/fatigue and weight loss. Negative for chills and fever.  HENT: Negative for congestion, ear discharge, ear pain and sore throat.   Eyes: Positive for pain. Negative for redness.  Respiratory: Negative for cough and sputum production.   Cardiovascular: Negative for chest pain, palpitations, claudication and leg swelling.  Gastrointestinal: Positive for abdominal pain and constipation. Negative for diarrhea, nausea and vomiting.  Genitourinary: Negative for dysuria, hematuria and urgency.  Musculoskeletal: Negative for falls.  Skin: Positive for itching.  Neurological: Positive for weakness. Negative for tingling and speech change.  Endo/Heme/Allergies: Does not bruise/bleed easily.  Psychiatric/Behavioral: Positive for memory loss. The patient has insomnia.      Allergies  Allergen Reactions  . Codeine Phosphate Other (See Comments)    REACTION: unspecified  . Duloxetine Other (See Comments)    REACTION: unspecified  . Meperidine Hcl Other (See Comments)    REACTION: unspecified  . Venlafaxine Other (See Comments)    REACTION: unspecified  . Doxycycline Hives and Rash    Past Medical History:  Diagnosis Date  . Adenomatous polyp 1999  . Anxiety   . Arthritis    Left knee  . Bacteremia    a. 03/2016 admitted with MRSA/GAS bacteremia following fall and dog bites;  b. 03/2017 TEE: EF 55-60%, triv AI,  mild MR, no veg.  Marland Kitchen CAD (coronary artery disease) of artery bypass graft    a. NSTEMI in 07/2015: Cath showed 99% stenosis of distal LCx  --> DES placed;  b. 08/2016 Lexiscan MV: no ischemia, EF 36% (likely 2/2 GI uptake/artifact).  . Carotid arterial disease ([place])    a. 2011 s/p R CEA.  . Depression    Paranoid tendencies  . Diverticulosis   . GERD (gastroesophageal reflux disease)   . Hyperlipidemia   . Hypertension   . Hypothyroidism   . Myocardial infarction ([place]) 2017  . Obesity   . Type II diabetes mellitus ([place])   . Urothelial cancer ([place])   . Urothelial carcinoma of kidney, left ([place])    a. 08/2016 s/p L nephrectomy-->now on immunoRx.    Past Surgical History:  Procedure Laterality Date  . ABDOMINAL HYSTERECTOMY  1977   One ovary left  . APPENDECTOMY    . BLADDER REPAIR  1977   Tacking  . CARDIAC CATHETERIZATION N/A 08/01/2015   Procedure: Left Heart Cath and Coronary Angiography;  Surgeon: Wellington Hampshire, MD;  Location: [place] [facility];  Service: Cardiovascular;  Laterality: N/A;  . CARDIAC CATHETERIZATION N/A 08/01/2015   Procedure: Coronary Stent Intervention;  Surgeon: Wellington Hampshire, MD;  Location: [place] [facility];  Service: Cardiovascular;  Laterality: N/A;  . CARDIOVASCULAR STRESS TEST  7/00   negative. No cardiolite  . CAROTID ENDARTERECTOMY  2/11   Dr Jamal Collin  .  [place]  . CHOLECYSTECTOMY    . CT ABD W & PELVIS WO CM  2002   Negative. Pituitary normal on MRI  . HEEL SPUR SURGERY  03/03   Left  . INTRAMEDULLARY (IM) NAIL INTERTROCHANTERIC Left 08/04/2015   Procedure: INTRAMEDULLARY (IM) NAIL INTERTROCHANTRIC;  Surgeon: Hessie Knows, MD;  Location: [facility];  Service: Orthopedics;  Laterality: Left;  . IR FLUORO GUIDE PORT INSERTION RIGHT  11/09/2016  . KNEE ARTHROSCOPY  2001   Left   Tamala Julian)  . RENAL MASS EXCISION Left april10/18   per pt left kidney removed -cancerous per pt /family  . ROBOT ASSISTED LAPAROSCOPIC NEPHRECTOMY  Left 10/02/2016   Procedure: ROBOTIC ASSISTED LAPAROSCOPIC RADICAL NEPHRECTOMY;  Surgeon: Hollice Espy, MD;  Location: [facility];  Service: Urology;  Laterality: Left;  . TEE WITHOUT CARDIOVERSION N/A 04/11/2016   Procedure: TRANSESOPHAGEAL ECHOCARDIOGRAM (TEE);  Surgeon: Teodoro Spray, MD;  Location: [facility];  Service: Cardiovascular;  Laterality: N/A;  . TOTAL KNEE ARTHROPLASTY  11/12   left--Dr Tamala Julian  . TRANSTHORACIC ECHOCARDIOGRAM  3/04   normal    Social History   Social History  . Marital status: Widowed    Spouse name: N/A  . Number of children: 3  . Years of education: N/A   Occupational History  . Receptionist---retired     [place]  .     [employer]land Kitchen      Social History Main Topics  . Smoking status: Former Smoker    Packs/day: 2.00    Years: 15.00    Types: Cigarettes    Quit date: 09/06/1986  . Smokeless tobacco: Never Used  . Alcohol use No  . Drug use: No  . Sexual activity: No   Other Topics Concern  . Not on file   Social History Narrative   Widowed 07/02.  Kids have moved out and now living with friend to share expenses.      3 caffeine drinks daily     Family History  Problem Relation Age of Onset  . Cancer Mother        Breast  . Heart disease Mother   . Diabetes Mother   . Colon polyps Mother   . Heart disease Father   . Diabetes Father   . Hypertension Sister   . Diabetes Sister   . Depression Daughter   . Cancer Paternal Grandmother        ? throat CA  . Colon cancer Unknown        Unsure if mother had it   . Rheum arthritis Sister   . Bladder Cancer Neg Hx   . Kidney cancer Neg Hx      Current Outpatient Prescriptions:  .  ACCU-CHEK SMARTVIEW test strip, , Disp: , Rfl:  .  aspirin EC 81 MG tablet, Take 1 tablet (81 mg total) by mouth daily., Disp: 90 tablet, Rfl: 3 .  atorvastatin (LIPITOR) 40 MG tablet, Take 40 mg by mouth daily., Disp: , Rfl:  .  citalopram (CELEXA) 20 MG tablet, Take 20 mg by mouth daily., Disp: , Rfl:    .  hydrochlorothiazide (HYDRODIURIL) 25 MG tablet, Take 12.5 mg by mouth daily. , Disp: , Rfl:  .  HYDROcodone-acetaminophen (NORCO) 10-325 MG tablet, Take 1 tablet by mouth every 4 (four) hours as needed., Disp: 180 tablet, Rfl: 0 .  insulin aspart (NOVOLOG) 100 UNIT/ML FlexPen, Inject into the skin., Disp: , Rfl:  .  insulin glargine (LANTUS) 100 UNIT/ML  injection, Inject 24 Units into the skin at bedtime., Disp: , Rfl:  .  levothyroxine (SYNTHROID, LEVOTHROID) 100 MCG tablet, Take 1 tablet (100 mcg total) by mouth daily., Disp: 90 tablet, Rfl: 1 .  lidocaine-prilocaine (EMLA) cream, Apply to affected area once, Disp: 30 g, Rfl: 3 .  linaclotide (LINZESS) 290 MCG CAPS capsule, Take 290 mcg by mouth daily before breakfast., Disp: , Rfl:  .  lisinopril (PRINIVIL,ZESTRIL) 20 MG tablet, Take 20 mg by mouth daily., Disp: , Rfl:  .  LORazepam (ATIVAN) 0.5 MG tablet, Take 1 tablet (0.5 mg total) by mouth every 6 (six) hours as needed (Nausea or vomiting)., Disp: 30 tablet, Rfl: 0 .  morphine (MS CONTIN) 15 MG 12 hr tablet, Take 1 tablet (15 mg total) by mouth every 12 (twelve) hours., Disp: 30 tablet, Rfl: 0 .  ondansetron (ZOFRAN) 8 MG tablet, Take 1 tablet (8 mg total) by mouth 2 (two) times daily as needed (Nausea or vomiting)., Disp: 30 tablet, Rfl: 2 .  traZODone (DESYREL) 100 MG tablet, Take 100 mg by mouth at bedtime., Disp: , Rfl:  No current facility-administered medications for this visit.   Facility-Administered Medications Ordered in Other Visits:  .  heparin lock flush 100 unit/mL, 500 Units, Intravenous, Once, Sindy Guadeloupe, MD .  heparin lock flush 100 unit/mL, 500 Units, Intravenous, Once, Sindy Guadeloupe, MD .  Influenza vac split quadrivalent PF (FLUARIX) injection 0.5 mL, 0.5 mL, Intramuscular, Once, Sindy Guadeloupe, MD .  sodium chloride flush (NS) 0.9 % injection 10 mL, 10 mL, Intravenous, PRN, Sindy Guadeloupe, MD  Physical exam:  Vitals:   03/28/17 0917  BP: (!) 107/54   Pulse: 72  Resp: 14  Temp: 98.8 F (37.1 C)  TempSrc: Tympanic  Weight: 162 lb (73.5 kg)   Physical Exam  Constitutional: She is oriented to person, place, and time.  Elderly, frail appearing female accompanied by daughter, seen sitting in wheelchair.Appears fatigued and in mild distress from abdominal pain  HENT:  Head: Normocephalic and atraumatic.  Eyes: Pupils are equal, round, and reactive to light. EOM are normal.  Neck: Normal range of motion.  Cardiovascular: Normal rate, regular rhythm and normal heart sounds.   Pulmonary/Chest: Effort normal and breath sounds normal.  Abdominal: Soft. Bowel sounds are normal. She exhibits no distension. There is tenderness (RLQ).  TTP in RLQ (unchanged from before). Mild TTP over epigastrium. Exam limited due to the patient's mobility.  Neurological: She is alert and oriented to person, place, and time.  Skin: Skin is warm and dry.  Psychiatric: She is apathetic. She exhibits abnormal recent memory and abnormal remote memory.     CMP Latest Ref Rng & Units 03/28/2017  Glucose 65 - 99 mg/dL 131(H)  BUN 6 - 20 mg/dL 17  Creatinine 0.44 - 1.00 mg/dL 1.06(H)  Sodium 135 - 145 mmol/L 136  Potassium 3.5 - 5.1 mmol/L 4.0  Chloride 101 - 111 mmol/L 103  CO2 22 - 32 mmol/L 25  Calcium 8.9 - 10.3 mg/dL 8.4(L)  Total Protein 6.5 - 8.1 g/dL 6.4(L)  Total Bilirubin 0.3 - 1.2 mg/dL 0.4  Alkaline Phos 38 - 126 U/L 73  AST 15 - 41 U/L 19  ALT 14 - 54 U/L 12(L)   CBC Latest Ref Rng & Units 03/28/2017  WBC 3.6 - 11.0 K/uL 9.5  Hemoglobin 12.0 - 16.0 g/dL 12.0  Hematocrit 35.0 - 47.0 % 35.1  Platelets 150 - 440 K/uL 346    Assessment  and plan- Patient is a 66 y.o. female with metastatic urothelial carcinoma on keytruda  1. Urothelial carcinoma- we'll resume [place] today.  2. Abdominal pain- etiology unclear- she has had this pain since her surgery in April 2018. Per Dr. Erlene Quan (urology)- she was taken to surgery thinking this was renal cell  but pathology showed urothelial carcinoma which was locally invading surrounding structures including nerves and blood vessels and margins were not negative. Hence she has been on immunotherapy with [place] although she does not have measurable disease on scans. Most recent scan from 03/19/2017 showed a slightly more prominent soft tissue prominence at the origin of the superior mesenteric and celiac artery extending along the anterior aspect of the aorta. Measurement was 3.02 cm x 2.3 cm. 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 Could consider PET study in the future. We'll repeat CT scans in 3 months. Will discuss patient at Tumor Board next week. Patient is status post appendectomy and does not believe she has never had a colonoscopy. Appreciate input from GI.  3. Patient has previously shown evidence of iron deficiency anemia and has received 2 doses of feraheme several months ago. She was referred to GI he did not feel the colonoscopy was warranted at this time. Patient is not anemic today. Hemoglobin is 12.0. She is to follow up by phone with GI in 1 week. No need for Feraheme today. Would consider repeating iron studies if patient becomes anemic in the future.   4. Opioid-induced constipation- patient is a poor historian and is difficult to ascertain frequency quality of bowel movements. Daughter feels the patient may be responding well to daily Linzess.Patient denies that more frequent bowel movements have changed her pain and reports the pain continues to be unrelieved by long-acting morphine and/or short acting hydrocodone. Due to constipation I am reluctant to increase her pain medicine at this time as this may worsen her constipation and subsequently her abdominal pain.  5. Itching-  Patient was switched to oxycodone to Vicodin due to reports of itching. On exam today patient has several excoriated areas with scabs. We will add  Neurontin  which may improve pain and help with itching. I have asked patient to stop Benadryl as this has not improved the itching and may contribute to constipation.  RTC in 3 weeks with labs in consideration of cycle 11 of [place].   Visit Diagnosis 1. Urothelial cancer ([place])   2. Encounter for antineoplastic immunotherapy     Beckey Rutter, DNP AGNP-C 03/28/17 11:40 AM   Dr. Randa Evens, MD, MPH [place] [facility] Pager- [phone] 03/28/2017 11:40 AM

## 2017-04-02 ENCOUNTER — Telehealth: Payer: Self-pay | Admitting: *Deleted

## 2017-04-02 ENCOUNTER — Telehealth: Payer: Self-pay | Admitting: Gastroenterology

## 2017-04-02 NOTE — Telephone Encounter (Signed)
Daughter has called to request Palliative Care Services. Please advise if in agreement. She has faxed over an order if in agreement also

## 2017-04-02 NOTE — Telephone Encounter (Signed)
Patient's daughter called and Mekiah still having abd pain and not having frequent bowel movement. What can she do?

## 2017-04-02 NOTE — Telephone Encounter (Signed)
Yes certainly we can make home palliative care referral.

## 2017-04-03 NOTE — Telephone Encounter (Signed)
Orders and records faxed

## 2017-04-04 NOTE — Telephone Encounter (Signed)
Lets try trulance- give her 2 weeks of samples and ask he to inform us how she is doing

## 2017-04-04 NOTE — Telephone Encounter (Signed)
Patient still experiencing constipation and abd pain.   Daughter picking up Trulance samples per Dr. Georgeann Oppenheim order.   Advised daughter that we'll try Trulance for a week. If symptoms do not resolve or improve, schedule appointment.

## 2017-04-09 ENCOUNTER — Telehealth: Payer: Self-pay | Admitting: *Deleted

## 2017-04-09 LAB — TYPE AND SCREEN
ABO/RH(D): O POS
Antibody Screen: NEGATIVE
UNIT DIVISION: 0
Unit division: 0

## 2017-04-09 LAB — BPAM RBC
BLOOD PRODUCT EXPIRATION DATE: 201804152359
BLOOD PRODUCT EXPIRATION DATE: 201805032359
ISSUE DATE / TIME: 201804101424
ISSUE DATE / TIME: 201804191253
Unit Type and Rh: 5100
Unit Type and Rh: 5100

## 2017-04-09 MED ORDER — MORPHINE SULFATE 15 MG PO TABS: 15.0000 mg | ORAL_TABLET | ORAL | 0 refills | Status: DC | PRN

## 2017-04-09 MED ORDER — MORPHINE SULFATE ER 15 MG PO TBCR: 15.0000 mg | EXTENDED_RELEASE_TABLET | Freq: Two times a day (BID) | ORAL | 0 refills | Status: DC

## 2017-04-09 NOTE — Telephone Encounter (Signed)
Daughter called to report that the [place] 10/325 is NOT controlling her mothers pain and that she is going to run out early because she has been giving her 2 at a time and more frequently than what is ordered. Palliative Care is coming out to see her Thursday. Her current prescription will be out on Thursday and it should last her until the 24 th. She is requesting something stronger for pain control and only wants a 2 week supply, not a month. Please advise

## 2017-04-09 NOTE — Telephone Encounter (Signed)
Per Dr Janese Banks, stop Norco, change to MS IR 15 mg every 4 hours as needed. Jerri notified to pick up Prescription in [place]

## 2017-04-11 ENCOUNTER — Encounter: Payer: Self-pay | Admitting: Cardiovascular Disease

## 2017-04-15 ENCOUNTER — Telehealth: Payer: Self-pay | Admitting: *Deleted

## 2017-04-15 NOTE — Telephone Encounter (Signed)
If no fever, I would just observe without antibiotics. If it worsens or if she has a fever I will prescribe. I see her in 1 week

## 2017-04-15 NOTE — Telephone Encounter (Signed)
Woke up coughing and it is yellow and black in color. Asking for antibiotics. Please advise

## 2017-04-15 NOTE — Telephone Encounter (Signed)
Anita Small informed and repeated back that she will call if fevered or worse

## 2017-04-18 ENCOUNTER — Ambulatory Visit: Payer: Medicare HMO | Admitting: Cardiovascular Disease

## 2017-04-18 ENCOUNTER — Ambulatory Visit: Payer: Medicare HMO | Admitting: Oncology

## 2017-04-18 ENCOUNTER — Other Ambulatory Visit: Payer: Medicare HMO

## 2017-04-18 ENCOUNTER — Ambulatory Visit: Payer: Medicare HMO

## 2017-04-19 ENCOUNTER — Telehealth: Payer: Self-pay | Admitting: *Deleted

## 2017-04-19 MED ORDER — MORPHINE SULFATE 15 MG PO TABS: 15.0000 mg | ORAL_TABLET | ORAL | 0 refills | Status: DC | PRN

## 2017-04-19 NOTE — Telephone Encounter (Signed)
Daughter Orlando Penner called and reports that patient is out of MS IR early because she has been giving it to her 2 at a time and she is still not in good pain control. Her current orders for pain medicine are MS ER 15 mg every 12 hours, MS IR 15 mg every 4 hours as needed. She got # 90 tabs on 10/16. She reports that she has not called about his until now. Please advise

## 2017-04-19 NOTE — Telephone Encounter (Signed)
I attempted to return call to [facility], had to leave message on voice mail

## 2017-04-19 NOTE — Telephone Encounter (Signed)
I spoke with Anita Small and explained that she can not give her mother more medicine than is prescribed and the reasoning for that. Informed that prescription is ready to pick up

## 2017-04-19 NOTE — Telephone Encounter (Signed)
I will refill at this time but I do not want her to be taking more pain meds nor do I want to go up on pain medications just yet. Etiology of her abdominal pain is unclear and partly may be opioid associated. It will potentially worsen with increasing pain meds. Please advise daughter to give meds only as prescribed. I will not be able to refill early again next time. I will discuss this in more detail when I see her next week

## 2017-04-22 ENCOUNTER — Inpatient Hospital Stay: Payer: Medicare HMO

## 2017-04-22 ENCOUNTER — Other Ambulatory Visit: Payer: Self-pay | Admitting: Oncology

## 2017-04-22 ENCOUNTER — Other Ambulatory Visit: Payer: Self-pay | Admitting: *Deleted

## 2017-04-22 ENCOUNTER — Encounter: Payer: Self-pay | Admitting: Oncology

## 2017-04-22 ENCOUNTER — Inpatient Hospital Stay (HOSPITAL_BASED_OUTPATIENT_CLINIC_OR_DEPARTMENT_OTHER): Payer: Medicare HMO | Admitting: Oncology

## 2017-04-22 ENCOUNTER — Telehealth: Payer: Self-pay | Admitting: *Deleted

## 2017-04-22 VITALS — BP 129/81 | HR 64 | Temp 98.0°F

## 2017-04-22 DIAGNOSIS — Z5111 Encounter for antineoplastic chemotherapy: Secondary | ICD-10-CM | POA: Diagnosis not present

## 2017-04-22 DIAGNOSIS — R1031 Right lower quadrant pain: Secondary | ICD-10-CM

## 2017-04-22 DIAGNOSIS — C689 Malignant neoplasm of urinary organ, unspecified: Secondary | ICD-10-CM

## 2017-04-22 DIAGNOSIS — R109 Unspecified abdominal pain: Secondary | ICD-10-CM | POA: Diagnosis not present

## 2017-04-22 DIAGNOSIS — Z905 Acquired absence of kidney: Secondary | ICD-10-CM | POA: Diagnosis not present

## 2017-04-22 DIAGNOSIS — C649 Malignant neoplasm of unspecified kidney, except renal pelvis: Secondary | ICD-10-CM | POA: Diagnosis not present

## 2017-04-22 DIAGNOSIS — Z79899 Other long term (current) drug therapy: Secondary | ICD-10-CM | POA: Diagnosis not present

## 2017-04-22 DIAGNOSIS — E860 Dehydration: Secondary | ICD-10-CM | POA: Diagnosis not present

## 2017-04-22 DIAGNOSIS — Z5112 Encounter for antineoplastic immunotherapy: Secondary | ICD-10-CM

## 2017-04-22 DIAGNOSIS — E871 Hypo-osmolality and hyponatremia: Secondary | ICD-10-CM

## 2017-04-22 DIAGNOSIS — G893 Neoplasm related pain (acute) (chronic): Secondary | ICD-10-CM

## 2017-04-22 LAB — CBC WITH DIFFERENTIAL/PLATELET
BASOS ABS: 0.1 10*3/uL (ref 0–0.1)
Basophils Relative: 1 %
EOS PCT: 5 %
Eosinophils Absolute: 0.4 10*3/uL (ref 0–0.7)
HCT: 36 % (ref 35.0–47.0)
Hemoglobin: 11.9 g/dL — ABNORMAL LOW (ref 12.0–16.0)
LYMPHS PCT: 12 %
Lymphs Abs: 1.1 10*3/uL (ref 1.0–3.6)
MCH: 28 pg (ref 26.0–34.0)
MCHC: 33.1 g/dL (ref 32.0–36.0)
MCV: 84.8 fL (ref 80.0–100.0)
MONO ABS: 0.4 10*3/uL (ref 0.2–0.9)
Monocytes Relative: 5 %
Neutro Abs: 6.8 10*3/uL — ABNORMAL HIGH (ref 1.4–6.5)
Neutrophils Relative %: 77 %
PLATELETS: 342 10*3/uL (ref 150–440)
RBC: 4.24 MIL/uL (ref 3.80–5.20)
RDW: 16.5 % — AB (ref 11.5–14.5)
WBC: 8.8 10*3/uL (ref 3.6–11.0)

## 2017-04-22 LAB — COMPREHENSIVE METABOLIC PANEL
ALT: 9 U/L — ABNORMAL LOW (ref 14–54)
AST: 14 U/L — AB (ref 15–41)
Albumin: 3 g/dL — ABNORMAL LOW (ref 3.5–5.0)
Alkaline Phosphatase: 88 U/L (ref 38–126)
Anion gap: 10 (ref 5–15)
BUN: 11 mg/dL (ref 6–20)
CHLORIDE: 95 mmol/L — AB (ref 101–111)
CO2: 25 mmol/L (ref 22–32)
Calcium: 8.2 mg/dL — ABNORMAL LOW (ref 8.9–10.3)
Creatinine, Ser: 0.83 mg/dL (ref 0.44–1.00)
Glucose, Bld: 138 mg/dL — ABNORMAL HIGH (ref 65–99)
POTASSIUM: 4 mmol/L (ref 3.5–5.1)
Sodium: 130 mmol/L — ABNORMAL LOW (ref 135–145)
Total Bilirubin: 0.4 mg/dL (ref 0.3–1.2)
Total Protein: 6.7 g/dL (ref 6.5–8.1)

## 2017-04-22 MED ORDER — SODIUM CHLORIDE 0.9% FLUSH
10.0000 mL | INTRAVENOUS | Status: DC | PRN
  Administered 2017-04-22: 10 mL via INTRAVENOUS
  Filled 2017-04-22: qty 10

## 2017-04-22 MED ORDER — LACTULOSE 10 GM/15ML PO SOLN: 30.0000 g | Freq: Three times a day (TID) | ORAL | 0 refills | Status: DC

## 2017-04-22 MED ORDER — PEMBROLIZUMAB CHEMO INJECTION 100 MG/4ML
200.0000 mg | Freq: Once | INTRAVENOUS | Status: AC
  Administered 2017-04-22: 200 mg via INTRAVENOUS
  Filled 2017-04-22: qty 8

## 2017-04-22 MED ORDER — SODIUM CHLORIDE 0.9 % IV SOLN
INTRAVENOUS | Status: DC
  Administered 2017-04-22: 10:00:00 via INTRAVENOUS
  Filled 2017-04-22 (×2): qty 1000

## 2017-04-22 MED ORDER — HEPARIN SOD (PORK) LOCK FLUSH 100 UNIT/ML IV SOLN
500.0000 [IU] | Freq: Once | INTRAVENOUS | Status: AC
  Administered 2017-04-22: 500 [IU] via INTRAVENOUS
  Filled 2017-04-22: qty 5

## 2017-04-22 MED ORDER — LORAZEPAM 0.5 MG PO TABS: 0.5000 mg | ORAL_TABLET | Freq: Four times a day (QID) | ORAL | 0 refills | Status: AC | PRN

## 2017-04-22 MED ORDER — SODIUM CHLORIDE 0.9 % IV SOLN
Freq: Once | INTRAVENOUS | Status: AC
  Administered 2017-04-22: 12:00:00 via INTRAVENOUS
  Filled 2017-04-22: qty 1000

## 2017-04-22 NOTE — Progress Notes (Signed)
Patient states that she is in a lot of pain today. She reports an 8 out of 10 pain scale in her abdomen. She has not had a bowel movement since last Wednesday, and even then it was not much. She tried an enema with no success. Miralax caused her stomach to hurt worse.

## 2017-04-22 NOTE — Patient Instructions (Signed)
Pembrolizumab injection  What is this medicine?  PEMBROLIZUMAB (pem broe liz ue mab) is a monoclonal antibody. It is used to treat melanoma, head and neck cancer, Hodgkin lymphoma, non-small cell lung cancer, urothelial cancer, stomach cancer, and cancers that have a certain genetic condition.  This medicine may be used for other purposes; ask your health care provider or pharmacist if you have questions.  COMMON BRAND NAME(S): Keytruda  What should I tell my health care provider before I take this medicine?  They need to know if you have any of these conditions:  -diabetes  -immune system problems  -inflammatory bowel disease  -liver disease  -lung or breathing disease  -lupus  -organ transplant  -an unusual or allergic reaction to pembrolizumab, other medicines, foods, dyes, or preservatives  -pregnant or trying to get pregnant  -breast-feeding  How should I use this medicine?  This medicine is for infusion into a vein. It is given by a health care professional in a hospital or clinic setting.  A special MedGuide will be given to you before each treatment. Be sure to read this information carefully each time.  Talk to your pediatrician regarding the use of this medicine in children. While this drug may be prescribed for selected conditions, precautions do apply.  Overdosage: If you think you have taken too much of this medicine contact a poison control center or emergency room at once.  NOTE: This medicine is only for you. Do not share this medicine with others.  What if I miss a dose?  It is important not to miss your dose. Call your doctor or health care professional if you are unable to keep an appointment.  What may interact with this medicine?  Interactions have not been studied.  Give your health care provider a list of all the medicines, herbs, non-prescription drugs, or dietary supplements you use. Also tell them if you smoke, drink alcohol, or use illegal drugs. Some items may interact with your  medicine.  This list may not describe all possible interactions. Give your health care provider a list of all the medicines, herbs, non-prescription drugs, or dietary supplements you use. Also tell them if you smoke, drink alcohol, or use illegal drugs. Some items may interact with your medicine.  What should I watch for while using this medicine?  Your condition will be monitored carefully while you are receiving this medicine.  You may need blood work done while you are taking this medicine.  Do not become pregnant while taking this medicine or for 4 months after stopping it. Women should inform their doctor if they wish to become pregnant or think they might be pregnant. There is a potential for serious side effects to an unborn child. Talk to your health care professional or pharmacist for more information. Do not breast-feed an infant while taking this medicine or for 4 months after the last dose.  What side effects may I notice from receiving this medicine?  Side effects that you should report to your doctor or health care professional as soon as possible:  -allergic reactions like skin rash, itching or hives, swelling of the face, lips, or tongue  -bloody or black, tarry  -breathing problems  -changes in vision  -chest pain  -chills  -constipation  -cough  -dizziness or feeling faint or lightheaded  -fast or irregular heartbeat  -fever  -flushing  -hair loss  -low blood counts - this medicine may decrease the number of white blood cells, red blood cells   and platelets. You may be at increased risk for infections and bleeding.  -muscle pain  -muscle weakness  -persistent headache  -signs and symptoms of high blood sugar such as dizziness; dry mouth; dry skin; fruity breath; nausea; stomach pain; increased hunger or thirst; increased urination  -signs and symptoms of kidney injury like trouble passing urine or change in the amount of urine  -signs and symptoms of liver injury like dark urine, light-colored  stools, loss of appetite, nausea, right upper belly pain, yellowing of the eyes or skin  -stomach pain  -sweating  -weight loss  Side effects that usually do not require medical attention (report to your doctor or health care professional if they continue or are bothersome):  -decreased appetite  -diarrhea  -tiredness  This list may not describe all possible side effects. Call your doctor for medical advice about side effects. You may report side effects to FDA at 1-800-FDA-1088.  Where should I keep my medicine?  This drug is given in a hospital or clinic and will not be stored at home.  NOTE: This sheet is a summary. It may not cover all possible information. If you have questions about this medicine, talk to your doctor, pharmacist, or health care provider.   2018 Elsevier/Gold Standard (2016-03-20 12:29:36)

## 2017-04-22 NOTE — Telephone Encounter (Signed)
Spoke with Orlando Penner via telephone and informed her of the Celiac Plexus Block for Friday, November 2nd at [facility]. Patient to arrive at the [facility] at 8:00 for a 9:00 procedure. NPO after midnight. She verbalized understanding.    dhs

## 2017-04-22 NOTE — Progress Notes (Signed)
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 Erlene Quan in March 2018. In late February 2018 patient presented with acute onset left-sided flank pain and was found to have a large 6 x 6.6 x 6 1 cm mid to left upper pole renal mass on which was directly fading the left adrenal gland measuring 3.1 x 3.9 cm. There was no obvious renal vein invasion. No intra-abdominal adenopathy was noted and chest x-ray did not reveal any obvious pulmonary disease  2. Patient underwent left radical nephrectomy on 10/02/2016. During the time of surgery the mass appeared incredibly fibrotic, desmoplastic reaction was noted in the medial aspect of the kidney in the hilar area involving the upper part of the hilum and the adrenal gland. There was also concern for renal vein invasion. Distal ureterectomy and lymph node dissection was not performed thinking that this was possibly a renal cell carcinoma.  3. DIAGNOSIS:  A. LEFT KIDNEY; ROBOTIC-ASSISTED LAPAROSCOPIC RADICAL NEPHRECTOMY:  - UROTHELIAL CARCINOMA, HIGH-GRADE.  - LYMPHOVASCULAR AND PERINEURAL INVASION PRESENT.  - THE HILAR MARGIN IS POSITIVE.  - THE URETERAL AND GEROTA'S FASCIA MARGINS ARE NEGATIVE.  - SEE SUMMARY BELOW.   Surgical Pathology Cancer Case Summary   RENAL PELVIS AND URETER:  Procedure: Robotic-assisted laparoscopic radical nephrectomy  Specimen Laterality: Left  Tumor Site: [place], upper and lower poles  Histologic Type: Urothelial carcinoma  Histologic Grade:  High grade  Tumor Extension: Tumor invades adjacent organs (adrenal gland) and  through the kidney into the perinephric fat  Margins: Hilar margin positive; ureteral and Gerota's fasciamargins  negative  Lymphovascular Invasion: Present  Regional Lymph Nodes: No lymph nodes submitted or found  Pathologic Stage Classification (pTNM, AJCC 8th Edition): pT4 pNX  TNM Descriptors: Not applicable  4. Discussed patient's case with Dr. Erlene Quan urology. She does not think that repeat surgery for distal ureterecomy and LN dissection can be performed given patients performance status and comorbidities. Patient lives with her sister and has limited mobility around the house. She is still complaining of abdominal pain which is persistent and of unclear etiology  5. Case discussed at tumor board and pathology slides and images reviewed. Repeat ct chest/abdomen does not reveal gross disease post surgery. However, operative findings suggest locally advanced carcinoma with positive margins suggestive of residual disease. LN not assessed although likely involved making this stage IV. Cystoscopy in may 2018 was NED  6. Patient was not deemed to be cisplatin eligible and given her PS plan was to proceed with [place]   Interval history- daughter reports patient has been feeling weaker over last few days. No bowel movement since last 5 days on Wednesday which was small and hard. She has tried miralax, senna, enema, suppository, trulance and linzess and nothing is working for her. Continues to complain of abdominal pain mainly in her RLQ  ECOG PS- 2 Pain scale- 6 Opioid associated constipation- yes  Review of systems- Review of Systems  Constitutional: Positive for malaise/fatigue. Negative for chills, fever and weight loss.  HENT: Negative for congestion, ear discharge and nosebleeds.   Eyes: Negative  for blurred vision.  Respiratory: Negative for cough, hemoptysis, sputum production, shortness of breath  and wheezing.   Cardiovascular: Negative for chest pain, palpitations, orthopnea and claudication.  Gastrointestinal: Positive for abdominal pain. Negative for blood in stool, constipation, diarrhea, heartburn, melena, nausea and vomiting.  Genitourinary: Negative for dysuria, flank pain, frequency, hematuria and urgency.  Musculoskeletal: Negative for back pain, joint pain and myalgias.  Skin: Negative for rash.  Neurological: Positive for weakness. Negative for dizziness, tingling, focal weakness, seizures and headaches.  Endo/Heme/Allergies: Does not bruise/bleed easily.  Psychiatric/Behavioral: Negative for depression and suicidal ideas. The patient does not have insomnia.       Allergies  Allergen Reactions  . Codeine Phosphate Other (See Comments)    REACTION: unspecified  . Duloxetine Other (See Comments)    REACTION: unspecified  . Meperidine Hcl Other (See Comments)    REACTION: unspecified  . Venlafaxine Other (See Comments)    REACTION: unspecified  . Doxycycline Hives and Rash     Past Medical History:  Diagnosis Date  . Adenomatous polyp 1999  . Anxiety   . Arthritis    Left knee  . Bacteremia    a. 03/2016 admitted with MRSA/GAS bacteremia following fall and dog bites;  b. 03/2017 TEE: EF 55-60%, triv AI, mild MR, no veg.  Marland Kitchen CAD (coronary artery disease) of artery bypass graft    a. NSTEMI in 07/2015: Cath showed 99% stenosis of distal LCx  --> DES placed;  b. 08/2016 Lexiscan MV: no ischemia, EF 36% (likely 2/2 GI uptake/artifact).  . Carotid arterial disease ([place])    a. 2011 s/p R CEA.  . Depression    Paranoid tendencies  . Diverticulosis   . GERD (gastroesophageal reflux disease)   . Hyperlipidemia   . Hypertension   . Hypothyroidism   . Myocardial infarction ([place]) 2017  . Obesity   . Type II diabetes mellitus ([place])   . Urothelial cancer ([place])   . Urothelial carcinoma of kidney, left ([place])    a. 08/2016 s/p L nephrectomy-->now on immunoRx.      Past Surgical History:  Procedure Laterality Date  . ABDOMINAL HYSTERECTOMY  1977   One ovary left  . APPENDECTOMY    . BLADDER REPAIR  1977   Tacking  . CARDIAC CATHETERIZATION N/A 08/01/2015   Procedure: Left Heart Cath and Coronary Angiography;  Surgeon: Wellington Hampshire, MD;  Location: [place] [facility];  Service: Cardiovascular;  Laterality: N/A;  . CARDIAC CATHETERIZATION N/A 08/01/2015   Procedure: Coronary Stent Intervention;  Surgeon: Wellington Hampshire, MD;  Location: [place] [facility];  Service: Cardiovascular;  Laterality: N/A;  . CARDIOVASCULAR STRESS TEST  7/00   negative. No cardiolite  . CAROTID ENDARTERECTOMY  2/11   Dr Jamal Collin  . [place]  . CHOLECYSTECTOMY    . CT ABD W & PELVIS WO CM  2002   Negative. Pituitary normal on MRI  . HEEL SPUR SURGERY  03/03   Left  . INTRAMEDULLARY (IM) NAIL INTERTROCHANTERIC Left 08/04/2015   Procedure: INTRAMEDULLARY (IM) NAIL INTERTROCHANTRIC;  Surgeon: Hessie Knows, MD;  Location: [facility];  Service: Orthopedics;  Laterality: Left;  . IR FLUORO GUIDE PORT INSERTION RIGHT  11/09/2016  . KNEE ARTHROSCOPY  2001   Left   Tamala Julian)  . RENAL MASS EXCISION Left april10/18   per pt left kidney removed -cancerous per pt /family  . ROBOT ASSISTED LAPAROSCOPIC NEPHRECTOMY Left 10/02/2016   Procedure: ROBOTIC ASSISTED LAPAROSCOPIC RADICAL NEPHRECTOMY;  Surgeon: Hollice Espy, MD;  Location: [facility];  Service: Urology;  Laterality: Left;  . TEE WITHOUT CARDIOVERSION N/A 04/11/2016   Procedure: TRANSESOPHAGEAL ECHOCARDIOGRAM (TEE);  Surgeon: Teodoro Spray, MD;  Location: [facility];  Service: Cardiovascular;  Laterality: N/A;  . TOTAL KNEE ARTHROPLASTY  11/12   left--Dr Tamala Julian  . TRANSTHORACIC ECHOCARDIOGRAM  3/04   normal    Social History   Social History  . Marital status: Widowed    Spouse name: N/A  . Number of children: 3  . Years of education: N/A   Occupational History  . Receptionist---retired      [employer]  .     Marland Kitchen      Social History Main Topics  . Smoking status: Former Smoker    Packs/day: 2.00    Years: 15.00    Types: Cigarettes    Quit date: 09/06/1986  . Smokeless tobacco: Never Used  . Alcohol use No  . Drug use: No  . Sexual activity: No   Other Topics Concern  . Not on file   Social History Narrative   Widowed 07/02.  Kids have moved out and now living with friend to share expenses.      3 caffeine drinks daily     Family History  Problem Relation Age of Onset  . Cancer Mother        Breast  . Heart disease Mother   . Diabetes Mother   . Colon polyps Mother   . Heart disease Father   . Diabetes Father   . Hypertension Sister   . Diabetes Sister   . Depression Daughter   . Cancer Paternal Grandmother        ? throat CA  . Colon cancer Unknown        Unsure if mother had it   . Rheum arthritis Sister   . Bladder Cancer Neg Hx   . Kidney cancer Neg Hx      Current Outpatient Prescriptions:  .  ACCU-CHEK SMARTVIEW test strip, , Disp: , Rfl:  .  aspirin EC 81 MG tablet, Take 1 tablet (81 mg total) by mouth daily., Disp: 90 tablet, Rfl: 3 .  atorvastatin (LIPITOR) 40 MG tablet, Take 40 mg by mouth daily., Disp: , Rfl:  .  citalopram (CELEXA) 20 MG tablet, Take 20 mg by mouth daily., Disp: , Rfl:  .  gabapentin (NEURONTIN) 300 MG capsule, 1 capsule PO qhs x3 days then 1 capsule PO BID x3 days, then 1 capsule PO TID, Disp: 81 capsule, Rfl: 0 .  hydrochlorothiazide (HYDRODIURIL) 25 MG tablet, Take 12.5 mg by mouth daily. , Disp: , Rfl:  .  insulin aspart (NOVOLOG) 100 UNIT/ML FlexPen, Inject into the skin., Disp: , Rfl:  .  insulin glargine (LANTUS) 100 UNIT/ML injection, Inject 24 Units into the skin at bedtime., Disp: , Rfl:  .  levothyroxine (SYNTHROID, LEVOTHROID) 100 MCG tablet, Take 1 tablet (100 mcg total) by mouth daily., Disp: 90 tablet, Rfl: 1 .  lidocaine-prilocaine (EMLA) cream, Apply to affected area once, Disp: 30 g, Rfl: 3 .   linaclotide (LINZESS) 290 MCG CAPS capsule, Take 290 mcg by mouth daily before breakfast., Disp: , Rfl:  .  lisinopril (PRINIVIL,ZESTRIL) 20 MG tablet, Take 20 mg by mouth daily., Disp: , Rfl:  .  LORazepam (ATIVAN) 0.5 MG tablet, Take 1 tablet (0.5 mg total) by mouth every 6 (six) hours as needed (Nausea or vomiting)., Disp: 30 tablet, Rfl: 0 .  morphine (  MS CONTIN) 15 MG 12 hr tablet, Take 1 tablet (15 mg total) by mouth every 12 (twelve) hours., Disp: 30 tablet, Rfl: 0 .  morphine (MSIR) 15 MG tablet, Take 1 tablet (15 mg total) by mouth every 4 (four) hours as needed for severe pain., Disp: 90 tablet, Rfl: 0 .  ondansetron (ZOFRAN) 8 MG tablet, Take 1 tablet (8 mg total) by mouth 2 (two) times daily as needed (Nausea or vomiting)., Disp: 30 tablet, Rfl: 2 .  traZODone (DESYREL) 100 MG tablet, Take 100 mg by mouth at bedtime., Disp: , Rfl:   Physical exam:  Vitals:   04/22/17 0936  BP: 129/81  Pulse: 64  Temp: 98 F (36.7 C)  TempSrc: Tympanic   Physical Exam  Constitutional: She is oriented to person, place, and time.  Appears frail and weak and mild distress from pain  HENT:  Head: Normocephalic and atraumatic.  Eyes: Pupils are equal, round, and reactive to light. EOM are normal.  Neck: Normal range of motion.  Cardiovascular: Normal rate, regular rhythm and normal heart sounds.   Pulmonary/Chest: Effort normal and breath sounds normal.  Abdominal: Soft. Bowel sounds are normal.  Diffuse TTP mainly over epigastrium and b/l lower quadrant. Bowel sounds sluggish. Non distended abdomen  Neurological: She is alert and oriented to person, place, and time.  Skin: Skin is warm and dry.     CMP Latest Ref Rng & Units 04/22/2017  Glucose 65 - 99 mg/dL 138(H)  BUN 6 - 20 mg/dL 11  Creatinine 0.44 - 1.00 mg/dL 0.83  Sodium 135 - 145 mmol/L 130(L)  Potassium 3.5 - 5.1 mmol/L 4.0  Chloride 101 - 111 mmol/L 95(L)  CO2 22 - 32 mmol/L 25  Calcium 8.9 - 10.3 mg/dL 8.2(L)  Total Protein  6.5 - 8.1 g/dL 6.7  Total Bilirubin 0.3 - 1.2 mg/dL 0.4  Alkaline Phos 38 - 126 U/L 88  AST 15 - 41 U/L 14(L)  ALT 14 - 54 U/L 9(L)   CBC Latest Ref Rng & Units 04/22/2017  WBC 3.6 - 11.0 K/uL 8.8  Hemoglobin 12.0 - 16.0 g/dL 11.9(L)  Hematocrit 35.0 - 47.0 % 36.0  Platelets 150 - 440 K/uL 342     Assessment and plan- Patient is a 66 y.o. female with metastatic urothelial carcinoma on keytruda  1. Counts ok to proceed with next dose of [place] today. I did reviw her CT abdomen at tumor board. There was soft tissue prominence noted around celiac arteries which could potentially indicate neoplasm recurrence. Discussed possible role for radiation therapy to treat soft tissue recurrence. Patients daughter does not feel she can bring the patient back and forth given her frailty and would like to hold off on that.  2. Abdominal pain- Patients daughte has been giving her more long acting pain meds than prescribed. GI evaluation also proposed opioid induced constipation as a possible cause of her abdominal apin. At this time I will not be making any changes to her opioid regimen.  I will increase to 300 mg TID if tolerated and see if it helps her abdominal pain. I will also refer her to Interventional Radiology and see if celiac plexus block could be an option for her abdominal pain  3. Constipation- patient will try lactulose for next 24 hours. golytely if still no bowel mvement after 24 hours. If still no bowel movement in 24 hours- she will call us and I will decide between seeing Dr. Vicente Males as an outpatient versus inpatient admission  4. Hyponatremia/ hypochloremia- will give 1L NS today  RTC in 3 weeks with cbc, cmp and next dose of keytruda    Visit Diagnosis 1. Urothelial cancer ([place])   2. Right lower quadrant abdominal pain   3. Encounter for antineoplastic immunotherapy      Dr. Randa Evens, MD, MPH [facility] at [facility] Pager- [phone] 04/22/2017 11:07  AM

## 2017-04-24 ENCOUNTER — Other Ambulatory Visit: Payer: Self-pay | Admitting: Radiology

## 2017-04-25 ENCOUNTER — Telehealth: Payer: Self-pay | Admitting: *Deleted

## 2017-04-25 DIAGNOSIS — G893 Neoplasm related pain (acute) (chronic): Secondary | ICD-10-CM

## 2017-04-25 DIAGNOSIS — Z5112 Encounter for antineoplastic immunotherapy: Secondary | ICD-10-CM

## 2017-04-25 DIAGNOSIS — R1031 Right lower quadrant pain: Secondary | ICD-10-CM

## 2017-04-25 DIAGNOSIS — C689 Malignant neoplasm of urinary organ, unspecified: Secondary | ICD-10-CM

## 2017-04-25 MED ORDER — LACTULOSE 10 GM/15ML PO SOLN: 30.0000 g | Freq: Three times a day (TID) | ORAL | 3 refills | Status: AC

## 2017-04-25 NOTE — Telephone Encounter (Signed)
Called patient's home and spoke to daughter Anita Small.  She states that patient was taking Neurontin 300 mg 3 times a day in the beginning she had never gotten to 100 mg that she thought for the last 2 days she has been given her 3 300 mg 3 times a day.  She had the shakes yesterday per the daughter.  I asked about her bowel movements and she had been taking the lactulose as ordered and finally had a good bowel movement yesterday evening.The daughter has  been told to continue the lactulose to keep her bowels moving unless she has diarrhea and then she will hold the drug until diarrhea has subsided.  I told daughter to not give any more of the gabapentin until she heard back from me about the dosage to give pt of the gabapentin. I spoke to Dr. Janese Banks and she states to take gabapentin 2 in am 1 in noon and 2 at night time for 4 days then increase 2 tablets tid.  She will start this today.  I did tel her that Dr. Janese Banks said that she can take lactulose if she not had BM in 2 days and if she has diarrhea then hold until it clears.  It is hard to manage it and do the best she can .

## 2017-04-26 ENCOUNTER — Other Ambulatory Visit: Payer: Self-pay | Admitting: *Deleted

## 2017-04-26 ENCOUNTER — Ambulatory Visit
Admission: RE | Admit: 2017-04-26 | Discharge: 2017-04-26 | Disposition: A | Discharge status: Home / Self Care | Payer: Medicare HMO | Source: Ambulatory Visit | Attending: Oncology | Admitting: Oncology

## 2017-04-26 DIAGNOSIS — K219 Gastro-esophageal reflux disease without esophagitis: Secondary | ICD-10-CM | POA: Insufficient documentation

## 2017-04-26 DIAGNOSIS — Z881 Allergy status to other antibiotic agents status: Secondary | ICD-10-CM | POA: Diagnosis not present

## 2017-04-26 DIAGNOSIS — F329 Major depressive disorder, single episode, unspecified: Secondary | ICD-10-CM | POA: Diagnosis not present

## 2017-04-26 DIAGNOSIS — R109 Unspecified abdominal pain: Secondary | ICD-10-CM | POA: Diagnosis not present

## 2017-04-26 DIAGNOSIS — Z888 Allergy status to other drugs, medicaments and biological substances status: Secondary | ICD-10-CM | POA: Insufficient documentation

## 2017-04-26 DIAGNOSIS — Z90710 Acquired absence of both cervix and uterus: Secondary | ICD-10-CM | POA: Diagnosis not present

## 2017-04-26 DIAGNOSIS — F419 Anxiety disorder, unspecified: Secondary | ICD-10-CM | POA: Insufficient documentation

## 2017-04-26 DIAGNOSIS — I25810 Atherosclerosis of coronary artery bypass graft(s) without angina pectoris: Secondary | ICD-10-CM | POA: Insufficient documentation

## 2017-04-26 DIAGNOSIS — Z96652 Presence of left artificial knee joint: Secondary | ICD-10-CM | POA: Diagnosis not present

## 2017-04-26 DIAGNOSIS — Z8249 Family history of ischemic heart disease and other diseases of the circulatory system: Secondary | ICD-10-CM | POA: Insufficient documentation

## 2017-04-26 DIAGNOSIS — Z85528 Personal history of other malignant neoplasm of kidney: Secondary | ICD-10-CM | POA: Insufficient documentation

## 2017-04-26 DIAGNOSIS — Z905 Acquired absence of kidney: Secondary | ICD-10-CM | POA: Insufficient documentation

## 2017-04-26 DIAGNOSIS — E785 Hyperlipidemia, unspecified: Secondary | ICD-10-CM | POA: Diagnosis not present

## 2017-04-26 DIAGNOSIS — Z9049 Acquired absence of other specified parts of digestive tract: Secondary | ICD-10-CM | POA: Insufficient documentation

## 2017-04-26 DIAGNOSIS — C679 Malignant neoplasm of bladder, unspecified: Secondary | ICD-10-CM | POA: Diagnosis not present

## 2017-04-26 DIAGNOSIS — Z794 Long term (current) use of insulin: Secondary | ICD-10-CM | POA: Diagnosis not present

## 2017-04-26 DIAGNOSIS — Z955 Presence of coronary angioplasty implant and graft: Secondary | ICD-10-CM | POA: Diagnosis not present

## 2017-04-26 DIAGNOSIS — E669 Obesity, unspecified: Secondary | ICD-10-CM | POA: Diagnosis not present

## 2017-04-26 DIAGNOSIS — I10 Essential (primary) hypertension: Secondary | ICD-10-CM | POA: Diagnosis not present

## 2017-04-26 DIAGNOSIS — Z87891 Personal history of nicotine dependence: Secondary | ICD-10-CM | POA: Insufficient documentation

## 2017-04-26 DIAGNOSIS — Z79899 Other long term (current) drug therapy: Secondary | ICD-10-CM | POA: Diagnosis not present

## 2017-04-26 DIAGNOSIS — Z800 Family history of malignant neoplasm of digestive organs: Secondary | ICD-10-CM | POA: Insufficient documentation

## 2017-04-26 DIAGNOSIS — M1990 Unspecified osteoarthritis, unspecified site: Secondary | ICD-10-CM | POA: Diagnosis not present

## 2017-04-26 DIAGNOSIS — Z9889 Other specified postprocedural states: Secondary | ICD-10-CM | POA: Diagnosis not present

## 2017-04-26 DIAGNOSIS — I252 Old myocardial infarction: Secondary | ICD-10-CM | POA: Diagnosis not present

## 2017-04-26 DIAGNOSIS — C689 Malignant neoplasm of urinary organ, unspecified: Secondary | ICD-10-CM | POA: Diagnosis present

## 2017-04-26 DIAGNOSIS — E039 Hypothyroidism, unspecified: Secondary | ICD-10-CM | POA: Diagnosis not present

## 2017-04-26 DIAGNOSIS — E119 Type 2 diabetes mellitus without complications: Secondary | ICD-10-CM | POA: Diagnosis not present

## 2017-04-26 DIAGNOSIS — Z803 Family history of malignant neoplasm of breast: Secondary | ICD-10-CM | POA: Insufficient documentation

## 2017-04-26 DIAGNOSIS — Z7982 Long term (current) use of aspirin: Secondary | ICD-10-CM | POA: Insufficient documentation

## 2017-04-26 DIAGNOSIS — Z833 Family history of diabetes mellitus: Secondary | ICD-10-CM | POA: Insufficient documentation

## 2017-04-26 LAB — BASIC METABOLIC PANEL
ANION GAP: 7 (ref 5–15)
BUN: 15 mg/dL (ref 6–20)
CO2: 29 mmol/L (ref 22–32)
Calcium: 8.4 mg/dL — ABNORMAL LOW (ref 8.9–10.3)
Chloride: 99 mmol/L — ABNORMAL LOW (ref 101–111)
Creatinine, Ser: 0.81 mg/dL (ref 0.44–1.00)
GFR calc Af Amer: >60 mL/min (ref >60)
Glucose, Bld: 61 mg/dL — ABNORMAL LOW (ref 65–99)
POTASSIUM: 3.8 mmol/L (ref 3.5–5.1)
SODIUM: 135 mmol/L (ref 135–145)

## 2017-04-26 LAB — PROTIME-INR
INR: 1.09
PROTHROMBIN TIME: 14 s (ref 11.4–15.2)

## 2017-04-26 LAB — GLUCOSE, CAPILLARY: GLUCOSE-CAPILLARY: 79 mg/dL (ref 65–99)

## 2017-04-26 LAB — APTT: APTT: 40 s — AB (ref 24–36)

## 2017-04-26 MED ORDER — BUPIVACAINE HCL (PF) 0.5 % IJ SOLN
30.0000 mL | Freq: Once | INTRAMUSCULAR | Status: DC
  Filled 2017-04-26: qty 30

## 2017-04-26 MED ORDER — ALCOHOL 98 % IJ SOLN
40.0000 mL | Freq: Once | INTRAMUSCULAR | Status: DC
  Filled 2017-04-26: qty 40

## 2017-04-26 MED ORDER — BUPIVACAINE HCL (PF) 0.5 % IJ SOLN
5.0000 mL | Freq: Once | INTRAMUSCULAR | Status: AC
  Administered 2017-04-26: 5 mL
  Filled 2017-04-26: qty 10

## 2017-04-26 MED ORDER — HYDROMORPHONE HCL 1 MG/ML IJ SOLN
INTRAMUSCULAR | Status: AC
  Administered 2017-04-26: 1 mg via INTRAVENOUS
  Filled 2017-04-26: qty 1

## 2017-04-26 MED ORDER — MORPHINE SULFATE ER 15 MG PO TBCR
15.0000 mg | EXTENDED_RELEASE_TABLET | Freq: Once | ORAL | Status: AC
  Administered 2017-04-26: 15 mg via ORAL

## 2017-04-26 MED ORDER — MORPHINE SULFATE ER 15 MG PO TBCR: 15.0000 mg | EXTENDED_RELEASE_TABLET | Freq: Two times a day (BID) | ORAL | 0 refills | Status: DC

## 2017-04-26 MED ORDER — MIDAZOLAM HCL 5 MG/5ML IJ SOLN
INTRAMUSCULAR | Status: AC
  Filled 2017-04-26: qty 5

## 2017-04-26 MED ORDER — DEXTROSE 5 % IV SOLN
INTRAVENOUS | Status: DC
  Administered 2017-04-26: 09:00:00 via INTRAVENOUS

## 2017-04-26 MED ORDER — SODIUM CHLORIDE FLUSH 0.9 % IV SOLN
INTRAVENOUS | Status: AC
  Filled 2017-04-26: qty 10

## 2017-04-26 MED ORDER — LIDOCAINE HCL (PF) 1 % IJ SOLN
INTRAMUSCULAR | Status: DC | PRN
  Administered 2017-04-26: 10 mL

## 2017-04-26 MED ORDER — ONDANSETRON HCL 4 MG/2ML IJ SOLN: 4.0000 mg | Freq: Four times a day (QID) | INTRAMUSCULAR | Status: DC

## 2017-04-26 MED ORDER — METHYLPREDNISOLONE ACETATE 80 MG/ML IJ SUSP
INTRAMUSCULAR | Status: AC
  Filled 2017-04-26: qty 1

## 2017-04-26 MED ORDER — HYDROMORPHONE HCL 1 MG/ML IJ SOLN
1.0000 mg | INTRAMUSCULAR | Status: DC | PRN
  Administered 2017-04-26: 1 mg via INTRAVENOUS

## 2017-04-26 MED ORDER — HEPARIN SOD (PORK) LOCK FLUSH 100 UNIT/ML IV SOLN
INTRAVENOUS | Status: AC
  Filled 2017-04-26: qty 5

## 2017-04-26 MED ORDER — SODIUM CHLORIDE 0.9 % IV SOLN
INTRAVENOUS | Status: DC
  Administered 2017-04-26: 09:00:00 via INTRAVENOUS

## 2017-04-26 MED ORDER — FENTANYL CITRATE (PF) 100 MCG/2ML IJ SOLN
INTRAMUSCULAR | Status: AC
  Filled 2017-04-26: qty 4

## 2017-04-26 MED ORDER — MIDAZOLAM HCL 5 MG/5ML IJ SOLN
INTRAMUSCULAR | Status: AC | PRN
  Administered 2017-04-26: 1 mg via INTRAVENOUS
  Administered 2017-04-26 (×4): 0.5 mg via INTRAVENOUS

## 2017-04-26 MED ORDER — ALCOHOL 98 % IJ SOLN
12.0000 mL | Freq: Once | INTRAMUSCULAR | Status: AC
  Administered 2017-04-26: 12 mL
  Filled 2017-04-26: qty 15

## 2017-04-26 MED ORDER — FENTANYL CITRATE (PF) 100 MCG/2ML IJ SOLN
INTRAMUSCULAR | Status: AC | PRN
  Administered 2017-04-26: 50 ug via INTRAVENOUS
  Administered 2017-04-26 (×3): 25 ug via INTRAVENOUS

## 2017-04-26 NOTE — Procedures (Signed)
Interventional Radiology Procedure Note  Procedure: CT guided neurolytic ablation of celiac plexus  Complications: None  Estimated Blood Loss: None  Findings: [place] needle advanced to region of celiac plexus.  Injection of dilute contrast shows spillage across both sides of midline. Injection performed of 5 mL 0.5% Sensorcaine, 80 mg Depo-Medrol and 12 mL of absolute dehydrated alcohol. Some exacerbation of abdominal pain with alcohol injection.  Will monitor.  Venetia Night. Kathlene Cote, M.D Pager:  [phone]

## 2017-04-26 NOTE — H&P (Signed)
Chief Complaint: Patient was seen in consultation today for celiac plexus block at the request of Rao,Archana C  Referring Physician(s): Rao,Archana C  Patient Status: [facility] - Out-pt  History of Present Illness: Anita Small is a 66 y.o. 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  She has had severe abdominal pain that is worsening and not responding to increasing doses of narcotic pain medications.  She describes the pain as sharp, burning and constant, localizing more to the right mid to lower abdomen.  No associated vomiting, diarrhea, melena, hematemesis or fever.  CT on 03/19/17 shows abnormal  Past Medical History:  Diagnosis Date  . Adenomatous polyp 1999  . Anxiety   . Arthritis    Left knee  . Bacteremia    a. 03/2016 admitted with MRSA/GAS bacteremia following fall and dog bites;  b. 03/2017 TEE: EF 55-60%, triv AI, mild MR, no veg.  Marland Kitchen CAD (coronary artery disease) of artery bypass graft    a. NSTEMI in 07/2015: Cath showed 99% stenosis of distal LCx  --> DES placed;  b. 08/2016 Lexiscan MV: no ischemia, EF 36% (likely 2/2 GI uptake/artifact).  . Carotid arterial disease ([place])    a. 2011 s/p R CEA.  . Depression    Paranoid tendencies  . Diverticulosis   . GERD (gastroesophageal reflux disease)   . Hyperlipidemia   . Hypertension   . Hypothyroidism   . Myocardial infarction ([place]) 2017  . Obesity   . Type II diabetes mellitus ([place])   . Urothelial cancer ([place])   . Urothelial carcinoma of kidney, left ([place])    a. 08/2016 s/p L nephrectomy-->now on immunoRx.    Past Surgical History:  Procedure Laterality Date  . ABDOMINAL HYSTERECTOMY  1977   One ovary left  . APPENDECTOMY    . BLADDER REPAIR  1977   Tacking  . CARDIAC CATHETERIZATION N/A 08/01/2015   Procedure: Left Heart Cath and Coronary Angiography;  Surgeon:  Wellington Hampshire, MD;  Location: [facility];  Service: Cardiovascular;  Laterality: N/A;  . CARDIAC CATHETERIZATION N/A 08/01/2015   Procedure: Coronary Stent Intervention;  Surgeon: Wellington Hampshire, MD;  Location: [facility];  Service: Cardiovascular;  Laterality: N/A;  . CARDIOVASCULAR STRESS TEST  7/00   negative. No cardiolite  . CAROTID ENDARTERECTOMY  2/11   Dr Jamal Collin  . [place]  . CHOLECYSTECTOMY    . CT ABD W & PELVIS WO CM  2002   Negative. Pituitary normal on MRI  . HEEL SPUR SURGERY  03/03   Left  . INTRAMEDULLARY (IM) NAIL INTERTROCHANTERIC Left 08/04/2015   Procedure: INTRAMEDULLARY (IM) NAIL INTERTROCHANTRIC;  Surgeon: Hessie Knows, MD;  Location: [facility];  Service: Orthopedics;  Laterality: Left;  . IR FLUORO GUIDE PORT INSERTION RIGHT  11/09/2016  . KNEE ARTHROSCOPY  2001   Left   Tamala Julian)  . RENAL MASS EXCISION Left april10/18   per pt left kidney removed -cancerous per pt /family  . ROBOT ASSISTED LAPAROSCOPIC NEPHRECTOMY Left 10/02/2016   Procedure: ROBOTIC ASSISTED LAPAROSCOPIC RADICAL NEPHRECTOMY;  Surgeon: Hollice Espy, MD;  Location: [facility];  Service: Urology;  Laterality: Left;  . TEE WITHOUT CARDIOVERSION N/A 04/11/2016   Procedure: TRANSESOPHAGEAL ECHOCARDIOGRAM (TEE);  Surgeon: Teodoro Spray, MD;  Location: [facility];  Service: Cardiovascular;  Laterality: N/A;  . TOTAL  KNEE ARTHROPLASTY  11/12   left--Dr Tamala Julian  . TRANSTHORACIC ECHOCARDIOGRAM  3/04   normal    Allergies: Codeine phosphate; Duloxetine; Meperidine hcl; Venlafaxine; and Doxycycline  Medications: Prior to Admission medications   Medication Sig Start Date End Date Taking? Authorizing Provider  atorvastatin (LIPITOR) 40 MG tablet Take 40 mg by mouth daily.   Yes [provider]  citalopram (CELEXA) 20 MG tablet Take 20 mg by mouth daily.   Yes [provider]  gabapentin (NEURONTIN) 300 MG capsule 1 capsule PO qhs x3 days then 1  capsule PO BID x3 days, then 1 capsule PO TID 03/28/17  Yes Verlon Au, NP  hydrochlorothiazide (HYDRODIURIL) 25 MG tablet Take 12.5 mg by mouth daily.    Yes [provider]  insulin aspart (NOVOLOG) 100 UNIT/ML FlexPen Inject into the skin. 10/23/16 10/23/17 Yes [provider]  insulin glargine (LANTUS) 100 UNIT/ML injection Inject 24 Units into the skin at bedtime.   Yes [provider]  lactulose (CHRONULAC) 10 GM/15ML solution Take 45 mLs (30 g total) by mouth 3 (three) times daily. 04/25/17  Yes Sindy Guadeloupe, MD  levothyroxine (SYNTHROID, LEVOTHROID) 100 MCG tablet Take 1 tablet (100 mcg total) by mouth daily. 02/25/14  Yes Venia Carbon, MD  lisinopril (PRINIVIL,ZESTRIL) 10 MG tablet Take 10 mg by mouth daily.   Yes [provider]  LORazepam (ATIVAN) 0.5 MG tablet Take 1 tablet (0.5 mg total) by mouth every 6 (six) hours as needed (Nausea or vomiting). 04/22/17  Yes Sindy Guadeloupe, MD  morphine (MS CONTIN) 15 MG 12 hr tablet Take 1 tablet (15 mg total) by mouth every 12 (twelve) hours. 04/09/17  Yes Sindy Guadeloupe, MD  morphine (MSIR) 15 MG tablet Take 1 tablet (15 mg total) by mouth every 4 (four) hours as needed for severe pain. 04/19/17  Yes Sindy Guadeloupe, MD  ondansetron (ZOFRAN) 8 MG tablet Take 1 tablet (8 mg total) by mouth 2 (two) times daily as needed (Nausea or vomiting). 02/01/17  Yes Sindy Guadeloupe, MD  traZODone (DESYREL) 100 MG tablet Take 100 mg by mouth at bedtime.   Yes [provider]  ACCU-CHEK SMARTVIEW test strip  10/23/16   [provider]  aspirin EC 81 MG tablet Take 1 tablet (81 mg total) by mouth daily. 09/05/16   Rise Mu, PA-C  lidocaine-prilocaine (EMLA) cream Apply to affected area once 11/02/16   Sindy Guadeloupe, MD  linaclotide [facility]) 290 MCG CAPS capsule Take 290 mcg by mouth daily before breakfast.    [provider]     Family History  Problem Relation Age of Onset  . Cancer Mother         Breast  . Heart disease Mother   . Diabetes Mother   . Colon polyps Mother   . Heart disease Father   . Diabetes Father   . Hypertension Sister   . Diabetes Sister   . Depression Daughter   . Cancer Paternal Grandmother        ? throat CA  . Colon cancer Unknown        Unsure if mother had it   . Rheum arthritis Sister   . Bladder Cancer Neg Hx   . Kidney cancer Neg Hx     Social History   Social History  . Marital status: Widowed    Spouse name: N/A  . Number of children: 3  . Years of education: N/A  Occupational History  . Receptionist---retired     [place]  .     [employer] Kitchen      Social History Main Topics  . Smoking status: Former Smoker    Packs/day: 2.00    Years: 15.00    Types: Cigarettes    Quit date: 09/06/1986  . Smokeless tobacco: Never Used  . Alcohol use No  . Drug use: No  . Sexual activity: No   Other Topics Concern  . None   Social History Narrative   Widowed 07/02.  Kids have moved out and now living with friend to share expenses.      3 caffeine drinks daily     ECOG Status: 3 - Symptomatic, >50% confined to bed  Review of Systems: A 12 point ROS discussed and pertinent positives are indicated in the HPI above.  All other systems are negative.  Review of Systems  Constitutional: Positive for fatigue.  Respiratory: Negative.   Cardiovascular: Negative.   Gastrointestinal: Positive for abdominal pain and constipation. Negative for anal bleeding, blood in stool, diarrhea, nausea and vomiting.  Genitourinary: Negative.   Musculoskeletal: Negative.   Neurological: Negative.     Vital Signs: BP (!) 112/56   Pulse (!) 54   Temp 98 F (36.7 C) (Oral)   Resp 13   Ht 5\' 2"  (1.575 m)   Wt 160 lb (72.6 kg)   SpO2 99%   BMI 29.26 kg/m   Physical Exam  Constitutional: She is oriented to person, place, and time.  HENT:  Head: Normocephalic and atraumatic.  Neck: Neck supple. No JVD present.  Cardiovascular: Normal rate and  regular rhythm.  Exam reveals no gallop and no friction rub.   Murmur heard. II/VI SEM  Pulmonary/Chest: Effort normal and breath sounds normal. No stridor. No respiratory distress. She has no wheezes. She has no rales.  Abdominal: Soft. She exhibits no mass. There is tenderness. There is guarding. There is no rebound.  Musculoskeletal: She exhibits no edema.  Lymphadenopathy:    She has no cervical adenopathy.  Neurological: She is alert and oriented to person, place, and time.  Skin: She is not diaphoretic.  Vitals reviewed.   Imaging: No results found.  Labs:  CBC:  Recent Labs  02/22/17 0937 03/18/17 0927 03/28/17 0904 04/22/17 0911  WBC 6.3 6.6 9.5 8.8  HGB 11.9* 12.5 12.0 11.9*  HCT 35.2 37.7 35.1 36.0  PLT 269 325 346 342    COAGS:  Recent Labs  09/05/16 1335 11/08/16 1336 04/26/17 0836  INR 1.07 0.95 1.09  APTT 28 25 40*    BMP:  Recent Labs  03/18/17 0927 03/28/17 0904 04/22/17 0911 04/26/17 0836  NA 134* 136 130* 135  K 3.7 4.0 4.0 3.8  CL 100* 103 95* 99*  CO2 24 25 25 29   GLUCOSE 122* 131* 138* 61*  BUN 14 17 11 15   CALCIUM 8.5* 8.4* 8.2* 8.4*  CREATININE 0.99 1.06* 0.83 0.81  GFRNONAA 57* 53* >60 >60  GFRAA >60 >60 >60 >60    LIVER FUNCTION TESTS:  Recent Labs  02/22/17 0937 03/18/17 0927 03/28/17 0904 04/22/17 0911  BILITOT 0.3 0.4 0.4 0.4  AST 14* 14* 19 14*  ALT 11* 9* 12* 9*  ALKPHOS 67 73 73 88  PROT 6.1* 6.8 6.4* 6.7  ALBUMIN 3.0* 3.1* 2.9* 3.0*    TUMOR MARKERS:  Recent Labs  10/19/16 1642  CEA 2.5    Assessment and Plan:  Discussed rationale for trying celiac  plexus neurolytic injection to treat pain with Mrs. Mcelhaney and her daughter.  Although her pain location is not classic for celiac plexus related pain, the tumor invasion of the celiac region is likely the cause of her intractable pain.  Given her lack of response to escalating narcotics which she takes nearly every hour, there is an indication to  trying a celiac block.  This would involve injecting anesthetic, steroid and using alcohol to try to ablate nerve tissue.  If there is a moderate/good response to injection, the procedure can be repeated in the future should pain return to a comparable level.  Will proceed today with celiac ablation under CT guidance.  Consent obtained and risks outlined including damage to adjacent structures, bleeding, exacerbation of pain (usually short term but potentially for 24 hours or so), transient hypotension and transient diarrhea.  Thank you for this interesting consult.  I greatly enjoyed meeting Juliann Olesky and look forward to participating in their care.  A copy of this report was sent to the requesting provider on this date.  Electronically Signed: Azzie Roup, MD 04/26/2017, 9:30 AM   I spent a total of 30 Minutes in face to face in clinical consultation, greater than 50% of which was counseling/coordinating care for celiac plexus neurolytic ablation.

## 2017-04-29 ENCOUNTER — Other Ambulatory Visit: Payer: Self-pay | Admitting: *Deleted

## 2017-04-29 DIAGNOSIS — C689 Malignant neoplasm of urinary organ, unspecified: Secondary | ICD-10-CM

## 2017-04-29 MED ORDER — GABAPENTIN 300 MG PO CAPS
600.0000 mg | ORAL_CAPSULE | Freq: Three times a day (TID) | ORAL | 2 refills | Status: AC
Start: 2017-04-29 — End: ?

## 2017-05-02 ENCOUNTER — Other Ambulatory Visit: Payer: Self-pay | Admitting: *Deleted

## 2017-05-02 MED ORDER — MORPHINE SULFATE 15 MG PO TABS: 15.0000 mg | ORAL_TABLET | ORAL | 0 refills | Status: DC | PRN

## 2017-05-02 MED ORDER — MORPHINE SULFATE ER 30 MG PO TBCR: 30.0000 mg | EXTENDED_RELEASE_TABLET | Freq: Two times a day (BID) | ORAL | 0 refills | Status: DC

## 2017-05-02 NOTE — Telephone Encounter (Signed)
Daughter called to report that she needs refill of her MSIR and that her pain medicine is not lasting asking to increase her medicine. States the nerve block did not work. Please advise

## 2017-05-08 ENCOUNTER — Other Ambulatory Visit: Payer: Self-pay | Admitting: *Deleted

## 2017-05-08 DIAGNOSIS — C689 Malignant neoplasm of urinary organ, unspecified: Secondary | ICD-10-CM

## 2017-05-13 ENCOUNTER — Inpatient Hospital Stay: Payer: Medicare HMO

## 2017-05-13 ENCOUNTER — Inpatient Hospital Stay: Payer: Medicare HMO | Attending: Oncology | Admitting: Oncology

## 2017-05-13 ENCOUNTER — Encounter: Payer: Self-pay | Admitting: Oncology

## 2017-05-13 VITALS — BP 104/62 | HR 71 | Temp 99.6°F | Wt 158.3 lb

## 2017-05-13 DIAGNOSIS — E785 Hyperlipidemia, unspecified: Secondary | ICD-10-CM | POA: Insufficient documentation

## 2017-05-13 DIAGNOSIS — M1990 Unspecified osteoarthritis, unspecified site: Secondary | ICD-10-CM | POA: Diagnosis not present

## 2017-05-13 DIAGNOSIS — F419 Anxiety disorder, unspecified: Secondary | ICD-10-CM | POA: Insufficient documentation

## 2017-05-13 DIAGNOSIS — L299 Pruritus, unspecified: Secondary | ICD-10-CM | POA: Diagnosis not present

## 2017-05-13 DIAGNOSIS — F329 Major depressive disorder, single episode, unspecified: Secondary | ICD-10-CM | POA: Diagnosis not present

## 2017-05-13 DIAGNOSIS — C689 Malignant neoplasm of urinary organ, unspecified: Secondary | ICD-10-CM

## 2017-05-13 DIAGNOSIS — Z8614 Personal history of Methicillin resistant Staphylococcus aureus infection: Secondary | ICD-10-CM | POA: Diagnosis not present

## 2017-05-13 DIAGNOSIS — K5900 Constipation, unspecified: Secondary | ICD-10-CM | POA: Insufficient documentation

## 2017-05-13 DIAGNOSIS — I252 Old myocardial infarction: Secondary | ICD-10-CM | POA: Insufficient documentation

## 2017-05-13 DIAGNOSIS — Z955 Presence of coronary angioplasty implant and graft: Secondary | ICD-10-CM | POA: Diagnosis not present

## 2017-05-13 DIAGNOSIS — E039 Hypothyroidism, unspecified: Secondary | ICD-10-CM | POA: Diagnosis not present

## 2017-05-13 DIAGNOSIS — C679 Malignant neoplasm of bladder, unspecified: Secondary | ICD-10-CM | POA: Diagnosis present

## 2017-05-13 DIAGNOSIS — Z79899 Other long term (current) drug therapy: Secondary | ICD-10-CM

## 2017-05-13 DIAGNOSIS — Z66 Do not resuscitate: Secondary | ICD-10-CM | POA: Diagnosis not present

## 2017-05-13 DIAGNOSIS — Z87891 Personal history of nicotine dependence: Secondary | ICD-10-CM | POA: Insufficient documentation

## 2017-05-13 DIAGNOSIS — E119 Type 2 diabetes mellitus without complications: Secondary | ICD-10-CM | POA: Diagnosis not present

## 2017-05-13 DIAGNOSIS — Z905 Acquired absence of kidney: Secondary | ICD-10-CM

## 2017-05-13 DIAGNOSIS — I25810 Atherosclerosis of coronary artery bypass graft(s) without angina pectoris: Secondary | ICD-10-CM | POA: Diagnosis not present

## 2017-05-13 DIAGNOSIS — L03311 Cellulitis of abdominal wall: Secondary | ICD-10-CM | POA: Insufficient documentation

## 2017-05-13 DIAGNOSIS — K219 Gastro-esophageal reflux disease without esophagitis: Secondary | ICD-10-CM | POA: Insufficient documentation

## 2017-05-13 DIAGNOSIS — I10 Essential (primary) hypertension: Secondary | ICD-10-CM | POA: Insufficient documentation

## 2017-05-13 DIAGNOSIS — E669 Obesity, unspecified: Secondary | ICD-10-CM | POA: Insufficient documentation

## 2017-05-13 DIAGNOSIS — Z7189 Other specified counseling: Secondary | ICD-10-CM

## 2017-05-13 LAB — COMPREHENSIVE METABOLIC PANEL
ALK PHOS: 79 U/L (ref 38–126)
ALT: 8 U/L — AB (ref 14–54)
AST: 16 U/L (ref 15–41)
Albumin: 2.8 g/dL — ABNORMAL LOW (ref 3.5–5.0)
Anion gap: 6 (ref 5–15)
BUN: 13 mg/dL (ref 6–20)
CALCIUM: 7.7 mg/dL — AB (ref 8.9–10.3)
CHLORIDE: 94 mmol/L — AB (ref 101–111)
CO2: 27 mmol/L (ref 22–32)
CREATININE: 0.84 mg/dL (ref 0.44–1.00)
Glucose, Bld: 90 mg/dL (ref 65–99)
Potassium: 3.5 mmol/L (ref 3.5–5.1)
Sodium: 127 mmol/L — ABNORMAL LOW (ref 135–145)
Total Bilirubin: 0.6 mg/dL (ref 0.3–1.2)
Total Protein: 6 g/dL — ABNORMAL LOW (ref 6.5–8.1)

## 2017-05-13 LAB — CBC WITH DIFFERENTIAL/PLATELET
BASOS PCT: 1 %
Basophils Absolute: 0 10*3/uL (ref 0–0.1)
EOS ABS: 1.2 10*3/uL — AB (ref 0–0.7)
EOS PCT: 16 %
HCT: 32.7 % — ABNORMAL LOW (ref 35.0–47.0)
Hemoglobin: 10.8 g/dL — ABNORMAL LOW (ref 12.0–16.0)
LYMPHS ABS: 0.9 10*3/uL — AB (ref 1.0–3.6)
Lymphocytes Relative: 12 %
MCH: 28.1 pg (ref 26.0–34.0)
MCHC: 33 g/dL (ref 32.0–36.0)
MCV: 85.2 fL (ref 80.0–100.0)
MONOS PCT: 5 %
Monocytes Absolute: 0.4 10*3/uL (ref 0.2–0.9)
Neutro Abs: 4.9 10*3/uL (ref 1.4–6.5)
Neutrophils Relative %: 66 %
PLATELETS: 307 10*3/uL (ref 150–440)
RBC: 3.84 MIL/uL (ref 3.80–5.20)
RDW: 16 % — ABNORMAL HIGH (ref 11.5–14.5)
WBC: 7.4 10*3/uL (ref 3.6–11.0)

## 2017-05-13 MED ORDER — SULFAMETHOXAZOLE-TRIMETHOPRIM 800-160 MG PO TABS: 1.0000 | ORAL_TABLET | Freq: Two times a day (BID) | ORAL | 0 refills | Status: AC

## 2017-05-13 MED ORDER — MORPHINE SULFATE 30 MG PO TABS: 30.0000 mg | ORAL_TABLET | ORAL | 0 refills | Status: DC | PRN

## 2017-05-13 MED ORDER — SODIUM CHLORIDE 0.9 % IV SOLN
Freq: Once | INTRAVENOUS | Status: AC
  Administered 2017-05-13: 10:00:00 via INTRAVENOUS
  Filled 2017-05-13: qty 1000

## 2017-05-13 MED ORDER — SODIUM CHLORIDE 0.9% FLUSH
10.0000 mL | INTRAVENOUS | Status: DC | PRN
  Administered 2017-05-13: 10 mL via INTRAVENOUS
  Filled 2017-05-13: qty 10

## 2017-05-13 MED ORDER — HEPARIN SOD (PORK) LOCK FLUSH 100 UNIT/ML IV SOLN
500.0000 [IU] | Freq: Once | INTRAVENOUS | Status: AC
  Administered 2017-05-13: 500 [IU] via INTRAVENOUS
  Filled 2017-05-13: qty 5

## 2017-05-13 NOTE — Progress Notes (Signed)
Patient here for follow up today with labs and treatment. She is in constant pain all the time and the pain medication is not working. The daughter wants to do something different. The pain medication only lasts a couple of hours and she is crying in pain. She is awakened from sleep due to the pain. She has a head to toe rash with severe itching and she scratched herself so much from constant itching, that she has a sore on her stomach and back. Daughter is concerned because she has a history of MRSA. The lactulose is helping with the bowel movements.  The patient has red fingernail polish all over her fingers and hands.There are some memory issues and confusion observed by RN today.

## 2017-05-13 NOTE — Progress Notes (Signed)
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 Erlene Quan in March 2018. In late February 2018 patient presented with acute onset left-sided flank pain and was found to have a large 6 x 6.6 x 6 1 cm mid to left upper pole renal mass on which was directly fading the left adrenal gland measuring 3.1 x 3.9 cm. There was no obvious renal vein invasion. No intra-abdominal adenopathy was noted and chest x-ray did not reveal any obvious pulmonary disease  2. Patient underwent left radical nephrectomy on 10/02/2016. During the time of surgery the mass appeared incredibly fibrotic, desmoplastic reaction was noted in the medial aspect of the kidney in the hilar area involving the upper part of the hilum and the adrenal gland. There was also concern for renal vein invasion. Distal ureterectomy and lymph node dissection was not performed thinking that this was possibly a renal cell carcinoma.  3. DIAGNOSIS:  A. LEFT KIDNEY; ROBOTIC-ASSISTED LAPAROSCOPIC RADICAL NEPHRECTOMY:  - UROTHELIAL CARCINOMA, HIGH-GRADE.  - LYMPHOVASCULAR AND PERINEURAL INVASION PRESENT.  - THE HILAR MARGIN IS POSITIVE.  - THE URETERAL AND GEROTA'S FASCIA MARGINS ARE NEGATIVE.  - SEE SUMMARY BELOW.   Surgical Pathology Cancer Case Summary   RENAL PELVIS AND URETER:  Procedure: Robotic-assisted laparoscopic radical nephrectomy  Specimen Laterality: Left  Tumor Site: Hilum, upper and lower poles  Histologic Type: Urothelial carcinoma  Histologic  Grade: High grade  Tumor Extension: Tumor invades adjacent organs (adrenal gland) and  through the kidney into the perinephric fat  Margins: Hilar margin positive; ureteral and Gerota's fasciamargins  negative  Lymphovascular Invasion: Present  Regional Lymph Nodes: No lymph nodes submitted or found  Pathologic Stage Classification (pTNM, AJCC 8th Edition): pT4 pNX  TNM Descriptors: Not applicable  4. Discussed patient's case with Dr. Erlene Quan urology. She does not think that repeat surgery for distal ureterecomy and LN dissection can be performed given patients performance status and comorbidities. Patient lives with her sister and has limited mobility around the house. She is still complaining of abdominal pain which is persistent and of unclear etiology  5. Case discussed at tumor board and pathology slides and images reviewed. Repeat ct chest/abdomen does not reveal gross disease post surgery. However, operative findings suggest locally advanced carcinoma with positive margins suggestive of residual disease. LN not assessed although likely involved making this stage IV. Cystoscopy in may 2018 was NED  6. Patient was not deemed to be cisplatin eligible and given her PS plan was to proceed with [place]. Interim scan after 3 months showed soft tissue prominence noted around celiac arteries which could potentially indicate neoplasm recurrence. Case discussed at tumor board. Plan was to continue [place] and offer radiation to the site which patient declined  7. patient continues to have abdominal pain likely due to locally advanced neoplasm (not readily apparent on scans) and opioid induced constipation. Celiac block has not worked     Interval history-patient continues to have significant abdominal pain which is not getting relieved with long and short-acting morphine.  Lactulose has been helping her with her constipation.  She also reports diffuse pruritus which has persisted despite  switching her pain medicines.  Patient wanted to paint her nails in the middle of the night yesterday and the nail polish landed up smudging all her fingernails.  Patient's daughter reports that overall patient's health is declining.  She spends most of her day in bed or sitting.  There are episodes of intermittent confusion.  ECOG PS- 3 Pain scale- 5 Opioid associated constipation- no  Review of systems- Review of Systems  Constitutional: Positive for malaise/fatigue. Negative for chills, fever and weight loss.  HENT: Negative for congestion, ear discharge and nosebleeds.   Eyes: Negative for blurred vision.  Respiratory: Negative for cough, hemoptysis, sputum production, shortness of breath and wheezing.   Cardiovascular: Negative for chest pain, palpitations, orthopnea and claudication.  Gastrointestinal: Positive for abdominal pain. Negative for blood in stool, constipation, diarrhea, heartburn, melena, nausea and vomiting.  Genitourinary: Negative for dysuria, flank pain, frequency, hematuria and urgency.  Musculoskeletal: Negative for back pain, joint pain and myalgias.  Skin: Positive for itching. Negative for rash.  Neurological: Positive for weakness. Negative for dizziness, tingling, focal weakness, seizures and headaches.  Endo/Heme/Allergies: Does not bruise/bleed easily.  Psychiatric/Behavioral: Negative for depression and suicidal ideas. The patient does not have insomnia.      Allergies  Allergen Reactions  . Codeine Phosphate Other (See Comments)    REACTION: unspecified  . Duloxetine Other (See Comments)    REACTION: unspecified  . Meperidine Hcl Other (See Comments)    REACTION: unspecified  . Venlafaxine Other (See Comments)    REACTION: unspecified  . Doxycycline Hives and Rash     Past Medical History:  Diagnosis Date  . Adenomatous polyp 1999  . Anxiety   . Arthritis    Left knee  . Bacteremia    a. 03/2016 admitted with MRSA/GAS bacteremia following  fall and dog bites;  b. 03/2017 TEE: EF 55-60%, triv AI, mild MR, no veg.  Marland Kitchen CAD (coronary artery disease) of artery bypass graft    a. NSTEMI in 07/2015: Cath showed 99% stenosis of distal LCx  --> DES placed;  b. 08/2016 Lexiscan MV: no ischemia, EF 36% (likely 2/2 GI uptake/artifact).  . Carotid arterial disease ([place])    a. 2011 s/p R CEA.  . Depression    Paranoid tendencies  . Diverticulosis   . GERD (gastroesophageal reflux disease)   . Hyperlipidemia   . Hypertension   . Hypothyroidism   . Myocardial infarction ([place]) 2017  . Obesity   . Type II diabetes mellitus ([place])   . Urothelial cancer ([place])   . Urothelial carcinoma of kidney, left ([place])    a. 08/2016 s/p L nephrectomy-->now on immunoRx.     Past Surgical History:  Procedure Laterality Date  . ABDOMINAL HYSTERECTOMY  1977   One ovary left  . APPENDECTOMY    . BLADDER REPAIR  1977   Tacking  . CARDIOVASCULAR STRESS TEST  7/00   negative. No cardiolite  . CAROTID ENDARTERECTOMY  2/11   Dr Jamal Collin  . [place]  . CHOLECYSTECTOMY    . Coronary Stent Intervention N/A 08/01/2015   Performed by Wellington Hampshire, MD at [facility]  . CT ABD W & PELVIS WO CM  2002   Negative. Pituitary normal on MRI  . HEEL SPUR SURGERY  03/03   Left  . INTRAMEDULLARY (IM) NAIL INTERTROCHANTRIC Left 08/04/2015   Performed by Hessie Knows, MD at [facility] ORS  .  INTRAMEDULLARY (IM) NAIL INTERTROCHANTRIC Left 07/29/2015   Performed by Hessie Knows, MD at [facility] ORS  . IR FLUORO GUIDE PORT INSERTION RIGHT  11/09/2016  . KNEE ARTHROSCOPY  2001   Left   Tamala Julian)  . Left Heart Cath and Coronary Angiography N/A 08/01/2015   Performed by Wellington Hampshire, MD at [place] [facility]  . RENAL MASS EXCISION Left april10/18   per pt left kidney removed -cancerous per pt /family  . ROBOTIC ASSISTED LAPAROSCOPIC RADICAL NEPHRECTOMY Left 10/02/2016   Performed by Hollice Espy, MD at [facility] ORS  . TOTAL KNEE ARTHROPLASTY  11/12    left--Dr Tamala Julian  . TRANSESOPHAGEAL ECHOCARDIOGRAM (TEE) N/A 04/11/2016   Performed by Teodoro Spray, MD at [facility] ORS  . TRANSTHORACIC ECHOCARDIOGRAM  3/04   normal    Social History   Socioeconomic History  . Marital status: Widowed    Spouse name: Not on file  . Number of children: 3  . Years of education: Not on file  . Highest education level: Not on file  Social Needs  . Financial resource strain: Not on file  . Food insecurity - worry: Not on file  . Food insecurity - inability: Not on file  . Transportation needs - medical: Not on file  . Transportation needs - non-medical: Not on file  Occupational History  . Occupation: Receptionist---retired    Comment: Estate agent  . Occupation:    . Occupation:    Tobacco Use  . Smoking status: Former Smoker    Packs/day: 2.00    Years: 15.00    Pack years: 30.00    Types: Cigarettes    Last attempt to quit: 09/06/1986    Years since quitting: 30.7  . Smokeless tobacco: Never Used  Substance and Sexual Activity  . Alcohol use: No  . Drug use: No  . Sexual activity: No    Birth control/protection: Post-menopausal, Surgical  Other Topics Concern  . Not on file  Social History Narrative   Widowed 07/02.  Kids have moved out and now living with friend to share expenses.      3 caffeine drinks daily     Family History  Problem Relation Age of Onset  . Cancer Mother        Breast  . Heart disease Mother   . Diabetes Mother   . Colon polyps Mother   . Heart disease Father   . Diabetes Father   . Hypertension Sister   . Diabetes Sister   . Depression Daughter   . Cancer Paternal Grandmother        ? throat CA  . Colon cancer Unknown        Unsure if mother had it   . Rheum arthritis Sister   . Bladder Cancer Neg Hx   . Kidney cancer Neg Hx      Current Outpatient Medications:  .  ACCU-CHEK SMARTVIEW test strip, , Disp: , Rfl:  .  aspirin EC 81 MG tablet, Take 1 tablet (81 mg total) by mouth daily.,  Disp: 90 tablet, Rfl: 3 .  atorvastatin (LIPITOR) 40 MG tablet, Take 40 mg by mouth daily., Disp: , Rfl:  .  citalopram (CELEXA) 20 MG tablet, Take 20 mg by mouth daily., Disp: , Rfl:  .  gabapentin (NEURONTIN) 300 MG capsule, Take 2 capsules (600 mg total) 3 (three) times daily by mouth., Disp: 180 capsule, Rfl: 2 .  hydrochlorothiazide (HYDRODIURIL) 25 MG tablet, Take 12.5 mg by mouth  daily. , Disp: , Rfl:  .  insulin aspart (NOVOLOG) 100 UNIT/ML FlexPen, Inject into the skin., Disp: , Rfl:  .  insulin glargine (LANTUS) 100 UNIT/ML injection, Inject 24 Units into the skin at bedtime., Disp: , Rfl:  .  lactulose (CHRONULAC) 10 GM/15ML solution, Take 45 mLs (30 g total) by mouth 3 (three) times daily., Disp: 540 mL, Rfl: 3 .  levothyroxine (SYNTHROID, LEVOTHROID) 100 MCG tablet, Take 1 tablet (100 mcg total) by mouth daily., Disp: 90 tablet, Rfl: 1 .  lidocaine-prilocaine (EMLA) cream, Apply to affected area once, Disp: 30 g, Rfl: 3 .  linaclotide (LINZESS) 290 MCG CAPS capsule, Take 290 mcg by mouth daily before breakfast., Disp: , Rfl:  .  lisinopril (PRINIVIL,ZESTRIL) 10 MG tablet, Take 10 mg by mouth daily., Disp: , Rfl:  .  LORazepam (ATIVAN) 0.5 MG tablet, Take 1 tablet (0.5 mg total) by mouth every 6 (six) hours as needed (Nausea or vomiting)., Disp: 30 tablet, Rfl: 0 .  morphine (MS CONTIN) 30 MG 12 hr tablet, Take 1 tablet (30 mg total) every 12 (twelve) hours by mouth., Disp: 60 tablet, Rfl: 0 .  morphine (MSIR) 15 MG tablet, Take 1 tablet (15 mg total) every 4 (four) hours as needed by mouth for severe pain., Disp: 90 tablet, Rfl: 0 .  ondansetron (ZOFRAN) 8 MG tablet, Take 1 tablet (8 mg total) by mouth 2 (two) times daily as needed (Nausea or vomiting)., Disp: 30 tablet, Rfl: 2 .  traZODone (DESYREL) 100 MG tablet, Take 100 mg by mouth at bedtime., Disp: , Rfl:   Physical exam:  Vitals:   05/13/17 0905  BP: 104/62  Pulse: 71  Temp: 99.6 F (37.6 C)  TempSrc: Tympanic  Weight:  158 lb 4.6 oz (71.8 kg)   Physical Exam  Constitutional: She is oriented to person, place, and time.  She is frail, appears fatigued and in distress due to pain  HENT:  Head: Normocephalic and atraumatic.  Eyes: EOM are normal. Pupils are equal, round, and reactive to light.  Neck: Normal range of motion.  Cardiovascular: Normal rate, regular rhythm and normal heart sounds.  Pulmonary/Chest: Effort normal and breath sounds normal.  Abdominal: Soft. Bowel sounds are normal.  Diffuse tenderness to palpation.  Superficial oval area of ulceration noted over the left anterior abdominal wall  Musculoskeletal:  Smudged nail polish noted all over her bilateral fingernails  Neurological: She is alert and oriented to person, place, and time.  Skin: Skin is warm and dry.  Generalized excoriating marks are noted all over her trunk lower back and over her buttocks as well as extremities     CMP Latest Ref Rng & Units 05/13/2017  Glucose 65 - 99 mg/dL 90  BUN 6 - 20 mg/dL 13  Creatinine 0.44 - 1.00 mg/dL 0.84  Sodium 135 - 145 mmol/L 127(L)  Potassium 3.5 - 5.1 mmol/L 3.5  Chloride 101 - 111 mmol/L 94(L)  CO2 22 - 32 mmol/L 27  Calcium 8.9 - 10.3 mg/dL 7.7(L)  Total Protein 6.5 - 8.1 g/dL 6.0(L)  Total Bilirubin 0.3 - 1.2 mg/dL 0.6  Alkaline Phos 38 - 126 U/L 79  AST 15 - 41 U/L 16  ALT 14 - 54 U/L 8(L)   CBC Latest Ref Rng & Units 05/13/2017  WBC 3.6 - 11.0 K/uL 7.4  Hemoglobin 12.0 - 16.0 g/dL 10.8(L)  Hematocrit 35.0 - 47.0 % 32.7(L)  Platelets 150 - 440 K/uL 307    No images are attached  to the encounter.  Ct Guided Needle Placement  Result Date: 04/26/2017 CLINICAL DATA:  Metastatic urothelial carcinoma with tumor invading the region of the celiac axis and plexus causing intractable abdominal pain. The patient presents for neurolytic celiac plexus block under CT guidance. EXAM: CT GUIDED NEUROLYTIC ABLATION OF THE CELIAC AXIS ANESTHESIA/SEDATION: Versed 3.5 mg IV, Fentanyl 125  mcg IV Total Moderate Sedation Time 33 minutes. The patient's level of consciousness and physiologic status were continuously monitored during the procedure by Radiology nursing. PROCEDURE: The procedure risks, benefits, and alternatives were explained to the patient. Questions regarding the procedure were encouraged and answered. The patient understands and consents to the procedure. A time-out was performed prior to initiating the procedure. The anterior abdominal wall was prepped with chlorhexidine in a sterile fashion, and a sterile drape was applied covering the operative field. A sterile gown and sterile gloves were used for the procedure. Local anesthesia was provided with 1% Lidocaine. A 22 gauge Chiba needle was advanced under CT guidance to the level of the celiac plexus. After confirming needle tip position, approximately 5 ml of a 1:10 dilution of Isovue-300 contrast and saline was injected. Spread of diluted contrast material was confirmed by CT. A mixture of 5 ml of 0.5% Sensorcaine and 80 mg Depo-Medrol was then made. This was injected through the needle. Alcohol ablation of the celiac plexus was then performed with injection of 12 ml of absolute dehydrated alcohol. The Chiba needle was removed. Additional CT images were performed. COMPLICATIONS: None FINDINGS: Contrast injection showed spread across both sides of midline at the level of the celiac plexus. Alcohol ablation was successfully performed. Alcohol injection was stopped when the patient did start to experience exacerbation of pain. IMPRESSION: CT guided neurolytic ablation of the celiac plexus as above. Alcohol ablation was performed with 12 ml of absolute alcohol. Therapeutic injection of Sensorcaine and Depo-Medrol was also performed. Electronically Signed   By: Aletta Edouard M.D.   On: 04/26/2017 11:28     Assessment and plan- Patient is a 66 y.o. female with metastatic urothelial carcinoma on [place]  I had a long discussion  with the patient and her daughter that although scans do not show gross evidence of disease other than soft tissue density noted around the celiac arteries, she likely has locally advanced urothelial carcinoma given her intraoperative findings.  Patient has been on [place] for over 4-5 months now and there has been no overt progression noted on scans.  However patient continues to decline clinically.  She is fatigued and spending most of her time in bed or chair and does not have a good quality of life.  Abdominal pain continues to be an issue.  We recently increased her morphine SR to 30 mg twice a day last week but her pain has not improved.  She is also on gabapentin and we tried celiac plexus block as well which did not help.  At this time keeping in mind her goals of care and quality of life it would be reasonable to stop treatment and focus on her symptoms and her quality of life.  I have recommended that hospice would be a reasonable consideration at this time and patient is agreeable for the same.  Patient is already a DNR and we will make referral to home hospice today.  I have increased her morphine short acting to 30 mg every 4 as needed for pain.  She will also continue morphine long-acting 30 mg twice daily.  Hospice will continue  to work with her pain management.  She will continued to be on lactulose for her constipation which is helping her.  With regards to superficial cellulitis noted over left anterior abdominal wall: She has a history of MRSA and I would therefore like to cover her with Bactrim DS for 7 days for community-acquired MRSA given that she is allergic to tetracycline  Pruritus: Unclear if it secondary to [facility] versus pain medicines.  We did switch her from fentanyl and oxycodone to morphine however patient continues to have significant pruritus.  She will continue topical ointments at this time and if this continues to be an issue we will consider giving her a short course  of steroids.  I will hold off on that at this time given that I am starting her on antibiotics for her abdominal wall cellulitis  I will see her in as-needed basis      Visit Diagnosis 1. Urothelial cancer ([place])   2. Goals of care, counseling/discussion      Dr. Randa Evens, MD, MPH [facility] at [facility] Pager- [phone] 05/13/2017 10:37 AM

## 2017-05-14 ENCOUNTER — Other Ambulatory Visit: Payer: Self-pay | Admitting: *Deleted

## 2017-05-14 NOTE — Progress Notes (Signed)
Faxed referral to Hospice per Dr. Elroy Channel request.     dhs

## 2017-05-15 ENCOUNTER — Telehealth: Payer: Self-pay | Admitting: *Deleted

## 2017-05-15 MED ORDER — METHYLPREDNISOLONE 4 MG PO TBPK: ORAL_TABLET | ORAL | 0 refills | Status: AC

## 2017-05-15 MED ORDER — CAMPHOR-MENTHOL 0.5-0.5 % EX LOTN: 1.0000 "application " | TOPICAL_LOTION | CUTANEOUS | 0 refills | Status: AC | PRN

## 2017-05-15 NOTE — Telephone Encounter (Signed)
We can try short course medrol pack starting at 40 mg for 1 week. Ok to proceed with sarna lotion

## 2017-05-15 NOTE — Telephone Encounter (Signed)
[facility] nurse called to report that patient is itching and benadryl is not helping. States that they have not gotten the signed protocol back yet and is asking for something to be called in as well as a prescription for Sarna lotion. Please advise

## 2017-05-15 NOTE — Telephone Encounter (Signed)
Per VO Dr Janese Banks ok to do taper of Medrol dose pack over 10 days. Otila Kluver notified of orders

## 2017-05-20 ENCOUNTER — Telehealth: Payer: Self-pay | Admitting: *Deleted

## 2017-05-20 ENCOUNTER — Other Ambulatory Visit: Payer: Self-pay | Admitting: *Deleted

## 2017-05-20 ENCOUNTER — Other Ambulatory Visit: Payer: Self-pay | Admitting: Oncology

## 2017-05-20 DIAGNOSIS — Z5112 Encounter for antineoplastic immunotherapy: Secondary | ICD-10-CM

## 2017-05-20 DIAGNOSIS — C689 Malignant neoplasm of urinary organ, unspecified: Secondary | ICD-10-CM

## 2017-05-20 DIAGNOSIS — R1031 Right lower quadrant pain: Secondary | ICD-10-CM

## 2017-05-20 DIAGNOSIS — G893 Neoplasm related pain (acute) (chronic): Secondary | ICD-10-CM

## 2017-05-20 MED ORDER — MORPHINE SULFATE 30 MG PO TABS: 30.0000 mg | ORAL_TABLET | ORAL | 0 refills | Status: DC | PRN

## 2017-05-20 NOTE — Telephone Encounter (Signed)
Hospice nurse requesting to increase long acting MS to three times a day Please advise

## 2017-05-20 NOTE — Telephone Encounter (Signed)
Sarah informed of increase approval

## 2017-05-20 NOTE — Telephone Encounter (Signed)
Ok to do so

## 2017-05-30 ENCOUNTER — Other Ambulatory Visit: Payer: Self-pay | Admitting: *Deleted

## 2017-05-30 MED ORDER — MORPHINE SULFATE ER 30 MG PO TBCR: 30.0000 mg | EXTENDED_RELEASE_TABLET | Freq: Three times a day (TID) | ORAL | 0 refills | Status: AC

## 2017-05-30 MED ORDER — MORPHINE SULFATE 30 MG PO TABS: 30.0000 mg | ORAL_TABLET | ORAL | 0 refills | Status: AC | PRN

## 2017-05-31 ENCOUNTER — Telehealth: Payer: Self-pay | Admitting: *Deleted

## 2017-05-31 MED ORDER — SULFAMETHOXAZOLE-TRIMETHOPRIM 800-160 MG PO TABS: 1.0000 | ORAL_TABLET | Freq: Two times a day (BID) | ORAL | 0 refills | Status: AC

## 2017-05-31 NOTE — Telephone Encounter (Signed)
Patient has a ruptured blister on her right heel which looks to have pus in the center of it. She is diabetic.  Spoke with Dr Janese Banks Bactrim DS twice a day times 7 days ordered, Sarah notified

## 2017-06-19 ENCOUNTER — Encounter: Payer: Self-pay | Admitting: Oncology

## 2017-06-25 DEATH — deceased

## 2017-09-04 ENCOUNTER — Other Ambulatory Visit: Payer: Self-pay | Admitting: Nurse Practitioner

## 2018-02-02 NOTE — Telephone Encounter (Signed)
Duplicate encounter of telephone call. The entire message in telephone encounter for same date

## 2018-02-27 IMAGING — XA IR FLUORO GUIDE CV LINE*R*
1 series · 1 of 1 positions shown · non-contrast
Comparison: none

INDICATION: 65-year-old female with a history of high-grade urothelial carcinoma
of the left kidney.

[Series 1: single · 1 of 1 slices shown]
[im 1/1]
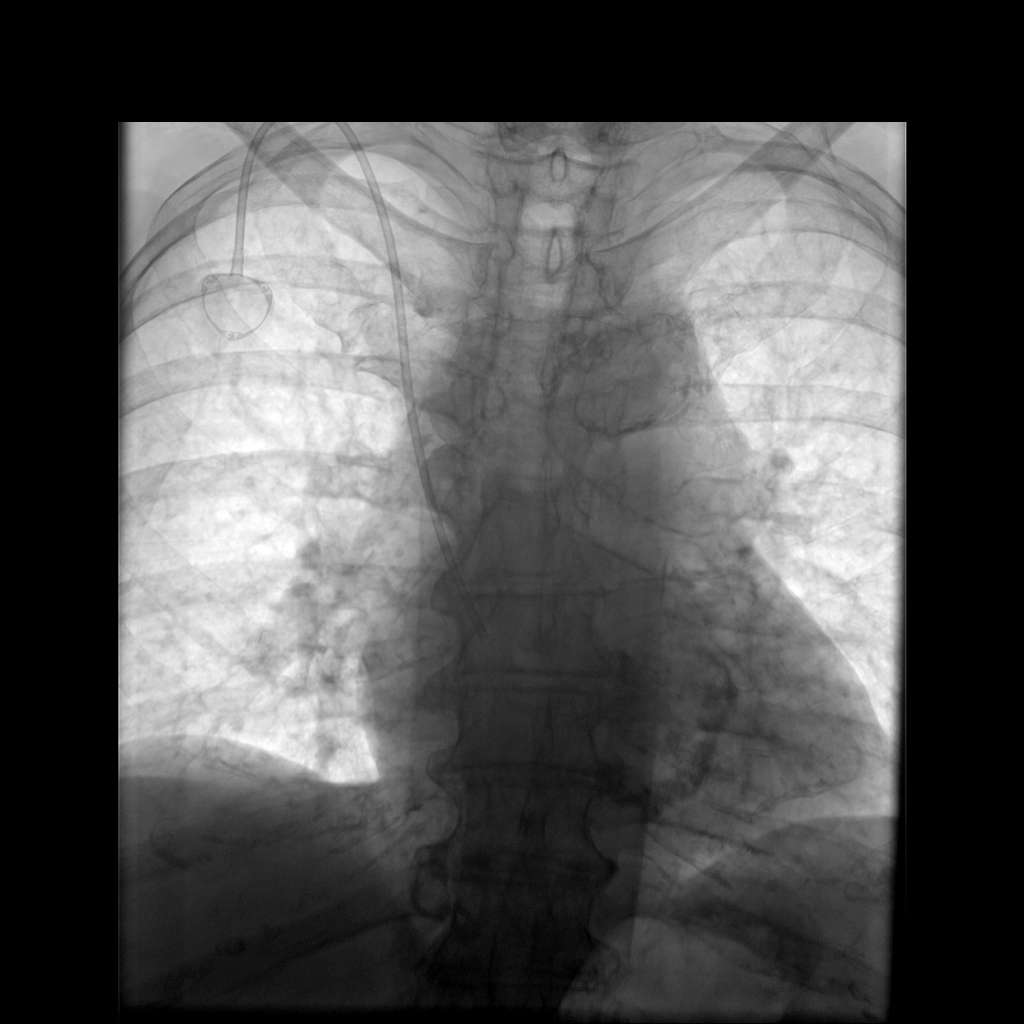

[1 of 1 positions shown; findings below may reference images not displayed]

EXAM:
IMPLANTED PORT A CATH PLACEMENT WITH ULTRASOUND AND FLUOROSCOPIC
GUIDANCE

MEDICATIONS:
2 g Ancef; The antibiotic was administered within an appropriate
time interval prior to skin puncture.

ANESTHESIA/SEDATION:
Versed 2.5 mg IV; Fentanyl 125 mcg IV;

Moderate Sedation Time:  20 minutes

The patient was continuously monitored during the procedure by the
interventional radiology nurse under my direct supervision.

FLUOROSCOPY TIME:  Zero minutes, 12 seconds (8 mGy)

COMPLICATIONS:
None immediate.

PROCEDURE:
The right neck and chest was prepped with chlorhexidine, and draped
in the usual sterile fashion using maximum barrier technique (cap
and mask, sterile gown, sterile gloves, large sterile sheet, hand
hygiene and cutaneous antiseptic). Antibiotic prophylaxis was
provided with g Ancef administered IV one hour prior to skin
incision. Local anesthesia was attained by infiltration with 1%
lidocaine with epinephrine.

Ultrasound demonstrated patency of the right internal jugular vein,
and this was documented with an image. Under real-time ultrasound
guidance, this vein was accessed with a 21 gauge micropuncture
needle and image documentation was performed. A small dermatotomy
was made at the access site with an 11 scalpel. A 0.018" wire was
advanced into the SVC and the access needle exchanged for a 4F
micropuncture vascular sheath. The 0.018" wire was then removed and
a 0.035" wire advanced into the IVC.



The venous access site was then serially dilated and a peel away
vascular sheath placed over the wire. The wire was removed and the
port catheter advanced into position under fluoroscopic guidance.
The catheter tip is positioned in the superior cavoatrial junction.
This was documented with a spot image. The portacatheter was then
tested and found to flush and aspirate well. The port was flushed
with saline followed by 100 units/mL heparinized saline.

The pocket was then closed in two layers using first subdermal
inverted interrupted absorbable sutures followed by a running
subcuticular suture. The epidermis was then sealed with Dermabond.
The dermatotomy at the venous access site was also closed with a
single inverted subdermal suture and the epidermis sealed with
Dermabond.
IMPRESSION: Successful placement of a right IJ approach Power Port with
ultrasound and fluoroscopic guidance. The catheter is ready for use.

## 2018-04-21 NOTE — Telephone Encounter (Signed)
error
